# Patient Record
Sex: Male | Born: 1950
Health system: Southern US, Community
[De-identification: ages and names within clinical notes are randomized; demographics above are authoritative.]

## PROBLEM LIST (undated history)

## (undated) ENCOUNTER — Emergency Department (HOSPITAL_COMMUNITY): Admission: EM | Payer: PPO | Source: Home / Self Care

## (undated) DIAGNOSIS — E7800 Pure hypercholesterolemia, unspecified: Secondary | ICD-10-CM

## (undated) DIAGNOSIS — R079 Chest pain, unspecified: Secondary | ICD-10-CM

## (undated) DIAGNOSIS — Z6835 Body mass index (BMI) 35.0-35.9, adult: Secondary | ICD-10-CM

## (undated) DIAGNOSIS — U071 COVID-19: Secondary | ICD-10-CM

## (undated) DIAGNOSIS — L989 Disorder of the skin and subcutaneous tissue, unspecified: Secondary | ICD-10-CM

## (undated) DIAGNOSIS — E785 Hyperlipidemia, unspecified: Secondary | ICD-10-CM

## (undated) DIAGNOSIS — I2510 Atherosclerotic heart disease of native coronary artery without angina pectoris: Secondary | ICD-10-CM

## (undated) DIAGNOSIS — R7989 Other specified abnormal findings of blood chemistry: Secondary | ICD-10-CM

## (undated) DIAGNOSIS — A419 Sepsis, unspecified organism: Secondary | ICD-10-CM

## (undated) DIAGNOSIS — H9313 Tinnitus, bilateral: Secondary | ICD-10-CM

## (undated) DIAGNOSIS — I10 Essential (primary) hypertension: Secondary | ICD-10-CM

## (undated) DIAGNOSIS — Z8679 Personal history of other diseases of the circulatory system: Secondary | ICD-10-CM

## (undated) DIAGNOSIS — E669 Obesity, unspecified: Secondary | ICD-10-CM

## (undated) DIAGNOSIS — M545 Low back pain, unspecified: Secondary | ICD-10-CM

## (undated) DIAGNOSIS — R519 Headache, unspecified: Secondary | ICD-10-CM

## (undated) DIAGNOSIS — M1990 Unspecified osteoarthritis, unspecified site: Secondary | ICD-10-CM

## (undated) DIAGNOSIS — G44209 Tension-type headache, unspecified, not intractable: Secondary | ICD-10-CM

## (undated) DIAGNOSIS — G4733 Obstructive sleep apnea (adult) (pediatric): Secondary | ICD-10-CM

## (undated) DIAGNOSIS — E119 Type 2 diabetes mellitus without complications: Secondary | ICD-10-CM

## (undated) DIAGNOSIS — I959 Hypotension, unspecified: Secondary | ICD-10-CM

## (undated) DIAGNOSIS — I209 Angina pectoris, unspecified: Secondary | ICD-10-CM

## (undated) DIAGNOSIS — K429 Umbilical hernia without obstruction or gangrene: Secondary | ICD-10-CM

## (undated) DIAGNOSIS — C679 Malignant neoplasm of bladder, unspecified: Secondary | ICD-10-CM

## (undated) DIAGNOSIS — N390 Urinary tract infection, site not specified: Secondary | ICD-10-CM

## (undated) DIAGNOSIS — N200 Calculus of kidney: Secondary | ICD-10-CM

## (undated) DIAGNOSIS — M25569 Pain in unspecified knee: Secondary | ICD-10-CM

## (undated) HISTORY — DX: Sepsis, unspecified organism: A41.9

## (undated) HISTORY — DX: Pain in unspecified knee: M25.569

## (undated) HISTORY — DX: Headache, unspecified: R51.9

## (undated) HISTORY — DX: Morbid (severe) obesity due to excess calories: E66.01

## (undated) HISTORY — DX: Hypotension, unspecified: I95.9

## (undated) HISTORY — PX: HERNIA REPAIR: SHX51

## (undated) HISTORY — DX: Other specified abnormal findings of blood chemistry: R79.89

## (undated) HISTORY — DX: Tension-type headache, unspecified, not intractable: G44.209

## (undated) HISTORY — DX: COVID-19: U07.1

## (undated) HISTORY — DX: Umbilical hernia without obstruction or gangrene: K42.9

## (undated) HISTORY — DX: Personal history of other diseases of the circulatory system: Z86.79

## (undated) HISTORY — DX: Low back pain, unspecified: M54.50

## (undated) HISTORY — DX: Type 2 diabetes mellitus without complications: E11.9

## (undated) HISTORY — DX: Angina pectoris, unspecified: I20.9

## (undated) HISTORY — DX: Body mass index (BMI) 35.0-35.9, adult: Z68.35

## (undated) HISTORY — DX: Tinnitus, bilateral: H93.13

## (undated) HISTORY — DX: Urinary tract infection, site not specified: N39.0

## (undated) HISTORY — DX: Calculus of kidney: N20.0

## (undated) HISTORY — DX: Obstructive sleep apnea (adult) (pediatric): G47.33

## (undated) HISTORY — DX: Pure hypercholesterolemia, unspecified: E78.00

## (undated) HISTORY — DX: Malignant neoplasm of bladder, unspecified: C67.9

## (undated) HISTORY — DX: Unspecified osteoarthritis, unspecified site: M19.90

## (undated) HISTORY — PX: CORONARY STENT PLACEMENT: SHX1402

## (undated) HISTORY — DX: Disorder of the skin and subcutaneous tissue, unspecified: L98.9

## (undated) HISTORY — DX: Obesity, unspecified: E66.9

## (undated) HISTORY — PX: LITHOTRIPSY: SUR834

---

## 2004-05-15 ENCOUNTER — Ambulatory Visit (HOSPITAL_COMMUNITY): Admission: RE | Admit: 2004-05-15 | Discharge: 2004-05-15 | Payer: Self-pay | Admitting: Unknown Physician Specialty

## 2004-06-03 ENCOUNTER — Ambulatory Visit (HOSPITAL_COMMUNITY): Admission: RE | Admit: 2004-06-03 | Discharge: 2004-06-03 | Payer: Self-pay | Admitting: Urology

## 2004-06-24 ENCOUNTER — Ambulatory Visit (HOSPITAL_COMMUNITY): Admission: RE | Admit: 2004-06-24 | Discharge: 2004-06-24 | Payer: Self-pay | Admitting: Urology

## 2004-07-02 ENCOUNTER — Ambulatory Visit (HOSPITAL_COMMUNITY): Admission: RE | Admit: 2004-07-02 | Discharge: 2004-07-02 | Payer: Self-pay | Admitting: Urology

## 2004-07-03 ENCOUNTER — Ambulatory Visit (HOSPITAL_COMMUNITY): Admission: RE | Admit: 2004-07-03 | Discharge: 2004-07-03 | Payer: Self-pay | Admitting: Urology

## 2007-04-26 ENCOUNTER — Ambulatory Visit (HOSPITAL_COMMUNITY): Admission: RE | Admit: 2007-04-26 | Discharge: 2007-04-26 | Payer: Self-pay | Admitting: Urology

## 2007-06-21 ENCOUNTER — Ambulatory Visit (HOSPITAL_COMMUNITY): Admission: RE | Admit: 2007-06-21 | Discharge: 2007-06-21 | Payer: Self-pay | Admitting: Urology

## 2007-06-29 ENCOUNTER — Ambulatory Visit (HOSPITAL_COMMUNITY): Admission: RE | Admit: 2007-06-29 | Discharge: 2007-06-29 | Payer: Self-pay | Admitting: Urology

## 2008-07-02 ENCOUNTER — Ambulatory Visit: Payer: Self-pay | Admitting: Cardiology

## 2011-04-06 NOTE — H&P (Signed)
NAME:  Kyle Lambert, Kyle Lambert                 ACCOUNT NO.:  [id number]   MEDICAL RECORD NO.:  [id number]          PATIENT TYPE:  AMB   LOCATION:  DAY                           FACILITY:  [facility]   PHYSICIAN:  Ky Barban, M.D.DATE OF BIRTH:  Jul 02, 1951   DATE OF ADMISSION:  DATE OF DISCHARGE:  LH                              HISTORY & PHYSICAL   CHIEF COMPLAINT:  Left renal calculus.   HISTORY:  This 60 year old gentleman has a large stone in his left  kidney.  He has a horseshoe-shaped kidney. He had a stone in the right  kidney also and had ESL done several years ago.  Recent CT does not show  any residual stone on the right side, but there is a 1.1 x 8.6 cm stone  in the left kidney causing no obstruction.  He is not having many  symptoms. He was having symptoms of prostatism which is being treated  with Flomax with good result.  No fever, chills, or gross hematuria.  He  is coming as outpatient to undergo ESL for left renal calculus.   PAST MEDICAL HISTORY:  1. As mentioned above.  2. He had left ventral hernia repair in 1983.  3. Arthritis.   He does not have diabetes or hypertension   REVIEW OF SYSTEMS:  Unremarkable.   PHYSICAL EXAMINATION:  GENERAL:  Well-nourished, well-developed male.  VITAL SIGNS:  Blood pressure 143/87, temperature 97.8.  CENTRAL NERVOUS SYSTEM:  Negative.  HEAD AND NECK:  Eyes and ENT negative.  CHEST:  Symmetrical.  HEART:  Regular sinus rhythm, no murmur.  ABDOMEN:  Soft, flat.  Liver, spleen, kidneys not palpable.  No CVA  tenderness.  EXTERNAL GENITALIA:  Uncircumcised meatus.  Testicles are normal.  RECTAL:  Normal sphincter tone.  No rectal mass.  Prostate smooth and  firm.   IMPRESSION:  1. Left renal calculus.  2. Horseshoe kidney.   PLAN:  ESL as outpatient.      Ky Barban, M.D.  Electronically Signed     MIJ/MEDQ  D:  04/25/2007  T:  04/25/2007  Job:  [id number]

## 2011-04-09 NOTE — Op Note (Signed)
NAME:  Kyle Lambert, Kyle Lambert                           ACCOUNT NO.:  [id number]   MEDICAL RECORD NO.:  [id number]                   PATIENT TYPE:  AMB   LOCATION:  DAY                                  FACILITY:  [facility]   PHYSICIAN:  Ky Barban, M.D.            DATE OF BIRTH:  [birth year]   DATE OF PROCEDURE:  06/03/2004  DATE OF DISCHARGE:                                 OPERATIVE REPORT   PREOPERATIVE DIAGNOSIS:  Right ureteral calculus.   POSTOPERATIVE DIAGNOSIS:  Right ureteral calculus.   PROCEDURE:  Cystoscopy, right retrograde pyelogram, ureteroscopic stone  basket removal of stone, no double J stent.   ATTENDING UROLOGIST:  Ky Barban, M.D.   ANESTHESIA:  General.   PROCEDURE:  The patient was given general anesthesia, placed in the  lithotomy position.  After usual prepped and draped, a #25 cystoscope was  introduced into the bladder, right ureteral orifice catheterized with a  wedge catheter, Hypaque injected under fluoroscopic control.  His ureter is  dilated and there appears to be a stone in the intramural ureter.  At this  point, I removed the cystoscope and a short rigid ureteroscope was  introduced.  I was able to get into the ureter without dilating it.  Stone  is just below the ureterovesical junction and I introduced the stone basket  above the stone, opened this basket, retrieved and engaged the stone and  removed it.  All the instruments were removed.  The patient left the  operating room in satisfactory condition.      ___________________________________________                                            Ky Barban, M.D.   MIJ/MEDQ  D:  06/03/2004  T:  06/03/2004  Job:  [id number]

## 2011-04-09 NOTE — H&P (Signed)
NAME:  Kyle Lambert, Kyle Lambert                           ACCOUNT NO.:  [id number]   MEDICAL RECORD NO.:  [id number]                   PATIENT TYPE:  AMB   LOCATION:  DAY                                  FACILITY:  [facility]   PHYSICIAN:  Ky Barban, M.D.            DATE OF BIRTH:  [birth year]   DATE OF ADMISSION:  DATE OF DISCHARGE:                                HISTORY & PHYSICAL   CHIEF COMPLAINT:  Right inguinal calculus.   HISTORY OF PRESENT ILLNESS:  A 60 year old gentleman who has a stone in his  right kidney about 8 mm in size.  He has a horseshoe kidney.  He had a stone  in the distal right ureter, and a couple of weeks ago, I did a stone basket  to remove the stone from the right ureter.  The stone in the right kidney is  rather large, so we planned to go ahead and have it removed with  lithotripsy.  He is familiar with the procedure and wants me to go ahead and  proceed.  He has a 4 mm stone in the left kidney which is not causing any  pain, so we are going to leave that alone.   PAST MEDICAL HISTORY:  1. Left ventral hernia repair in 1983.  2. Arthritis.  3. ESL right kidney stone about three years ago.  4. Right ureteral stone basket a couple of weeks ago.   FAMILY HISTORY:  No history of prostate cancer.   PERSONAL HISTORY:  Does not smoke or drink.   REVIEW OF SYSTEMS:  Unremarkable.   PHYSICAL EXAMINATION:  VITAL SIGNS:  Blood pressure 150/80, temperature  normal.  CENTRAL NERVOUS SYSTEM:  Negative.  HEAD, NECK, EYE, ENT:  Negative.  CHEST:  Clear.  HEART:  Regular sinus rhythm.  ABDOMEN:  Soft, flat.  Liver, spleen, kidneys not palpable.  No CVA  tenderness  EXTERNAL GENITALIA:  Unremarkable.  RECTAL:  Exam deferred.  EXTREMITIES:  Normal.   IMPRESSION:  Right renal calculus.   PLAN:  ESL right renal calculus.    ___________________________________________                                         Ky Barban, M.D.   MIJ/MEDQ  D:  06/23/2004  T:   06/23/2004  Job:  [id number]

## 2011-04-09 NOTE — H&P (Signed)
NAME:  Kyle Lambert, Kyle Lambert NO.:  [id number]   MEDICAL RECORD NO.:  [id number]                  PATIENT TYPE:   LOCATION:                                       FACILITY:   PHYSICIAN:  Ky Barban, M.D.            DATE OF BIRTH:   DATE OF ADMISSION:  DATE OF DISCHARGE:                                HISTORY & PHYSICAL   CHIEF COMPLAINT:  Right renal colic.   HISTORY OF PRESENT ILLNESS:  A 60 year old gentleman who is well-known to  me.  He is having pain in his right flank for several days.  Last week he  was hurting real bad.  He had an MRI done because of this backache, and on  MRI it was noted that he had a stone in his right kidney and the right  ureter.  I have known this patient from previous treatment a couple of years  ago.  He had an ESL done for right renal calculus which was rather large,  and he has a horseshoe shaped kidney.  It was treated.  I have not seen him  since then.  He comes in with right renal colic.  He has a 4 mm stone in the  distal right ureter, and an 8 mm stone in the right kidney.  There is  another stone in the left kidney.  His pain in the right flank is because of  his 4 mm stone in the distal right ureter.  Last week I told him to see if  he can pass it.  He comes back today that he is miserable last week.  Today,  again, he is having a lot of pain.  He wants something done about it, so I  have advised him to undergo stone basket.  The procedure of stone basket has  complications, especially ureteral perforation leading to a puncture with  the use of double J stent, stone migration were all discussed with the  patient and his wife.  They understand and want me to go ahead and proceed.  The use of double J stent was also discussed.  He coming as an outpatient  tomorrow to undergo stone basket.   PAST MEDICAL HISTORY:  1. Left ventral hernia repair in 1983.  2. Arthritis.  3. Extracorporeal shockwave lithotripsy  right kidney about three years ago.   FAMILY HISTORY:  No history of prostate cancer.   SOCIAL HISTORY:  Personal history:  He does not smoke or drink.   REVIEW OF SYSTEMS:  Unremarkable.  No history of diabetes or hypertension.   PHYSICAL EXAMINATION:  GENERAL:  A well-nourished, well-developed male, in  moderate distress.  VITAL SIGNS:  Blood pressure 150/80, temperature is normal.  NEUROLOGIC:  Central nervous system, no gross neurological deficits.  HEENT:  Head, neck, ENT negative.  CHEST:  Symmetrical.  HEART:  Regular sinus rhythm.  ABDOMEN:  Soft, flat.  Liver, spleen, kidneys are not palpable.  There is 1+  right CVA tenderness.  EXTERNAL GENITALIA:  Unremarkable.  RECTAL EXAM:  Prostate 1.5+ smooth, firm.   IMPRESSION:  Right ureteral and renal calculus.   PLAN:  Right retrograde pyelogram, ureteroscopic chromium laser lithotripsy,  stone basket, and double J stent under anesthesia as an outpatient.     ___________________________________________                                         Ky Barban, M.D.   MIJ/MEDQ  D:  06/02/2004  T:  06/02/2004  Job:  [id number]

## 2011-04-09 NOTE — H&P (Signed)
NAME:  Kyle Lambert, Kyle Lambert                 ACCOUNT NO.:  [id number]   MEDICAL RECORD NO.:  [id number]          PATIENT TYPE:  AMB   LOCATION:  DAY                           FACILITY:  [facility]   PHYSICIAN:  Ky Barban, M.D.DATE OF BIRTH:  [birth year]   DATE OF ADMISSION:  DATE OF DISCHARGE:  LH                              HISTORY & PHYSICAL   ADDENDUM:  This 60 year old gentleman has left renal calculus and a  horseshoe shaped kidney.  He is scheduled to have ESL and he was brought  in about a month ago but his procedure was canceled because he did not  quit taking his aspirin.  Now he is brought back again for the same  procedure, so there is no change in his history and physical exam and  this should be used as an Addendum to his History and Physical from last  month.   PLAN:  Is the same, to undergo ESL for his left renal calculus.      Ky Barban, M.D.  Electronically Signed     MIJ/MEDQ  D:  07/21/2007  T:  07/21/2007  Job:  [id number]

## 2012-04-21 ENCOUNTER — Encounter (HOSPITAL_COMMUNITY)
Admission: AD | Disposition: A | Discharge status: Home / Self Care | Payer: Self-pay | Source: Other Acute Inpatient Hospital | Attending: Cardiology

## 2012-04-21 ENCOUNTER — Inpatient Hospital Stay (HOSPITAL_COMMUNITY)
Admission: AD | Admit: 2012-04-21 | Discharge: 2012-04-25 | DRG: 854 | Disposition: A | Discharge status: Home / Self Care | Payer: BC Managed Care – PPO | Source: Other Acute Inpatient Hospital | Attending: Cardiology | Admitting: Cardiology

## 2012-04-21 ENCOUNTER — Encounter (HOSPITAL_COMMUNITY): Payer: Self-pay | Admitting: General Practice

## 2012-04-21 ENCOUNTER — Ambulatory Visit (HOSPITAL_COMMUNITY): Admit: 2012-04-21 | Payer: BC Managed Care – PPO | Admitting: Cardiology

## 2012-04-21 ENCOUNTER — Other Ambulatory Visit: Payer: Self-pay | Admitting: Physician Assistant

## 2012-04-21 DIAGNOSIS — Z7982 Long term (current) use of aspirin: Secondary | ICD-10-CM

## 2012-04-21 DIAGNOSIS — M129 Arthropathy, unspecified: Secondary | ICD-10-CM | POA: Diagnosis present

## 2012-04-21 DIAGNOSIS — I2582 Chronic total occlusion of coronary artery: Secondary | ICD-10-CM | POA: Diagnosis present

## 2012-04-21 DIAGNOSIS — G4700 Insomnia, unspecified: Secondary | ICD-10-CM | POA: Diagnosis present

## 2012-04-21 DIAGNOSIS — I2510 Atherosclerotic heart disease of native coronary artery without angina pectoris: Principal | ICD-10-CM | POA: Diagnosis present

## 2012-04-21 DIAGNOSIS — I200 Unstable angina: Secondary | ICD-10-CM

## 2012-04-21 DIAGNOSIS — R079 Chest pain, unspecified: Secondary | ICD-10-CM

## 2012-04-21 DIAGNOSIS — Z79899 Other long term (current) drug therapy: Secondary | ICD-10-CM

## 2012-04-21 DIAGNOSIS — I10 Essential (primary) hypertension: Secondary | ICD-10-CM | POA: Diagnosis present

## 2012-04-21 DIAGNOSIS — E785 Hyperlipidemia, unspecified: Secondary | ICD-10-CM | POA: Diagnosis present

## 2012-04-21 DIAGNOSIS — Z955 Presence of coronary angioplasty implant and graft: Secondary | ICD-10-CM

## 2012-04-21 DIAGNOSIS — E782 Mixed hyperlipidemia: Secondary | ICD-10-CM

## 2012-04-21 HISTORY — DX: Chest pain, unspecified: R07.9

## 2012-04-21 HISTORY — DX: Hyperlipidemia, unspecified: E78.5

## 2012-04-21 HISTORY — DX: Atherosclerotic heart disease of native coronary artery without angina pectoris: I25.10

## 2012-04-21 HISTORY — PX: LEFT HEART CATHETERIZATION WITH CORONARY ANGIOGRAM: SHX5451

## 2012-04-21 HISTORY — DX: Essential (primary) hypertension: I10

## 2012-04-21 HISTORY — DX: Unspecified osteoarthritis, unspecified site: M19.90

## 2012-04-21 LAB — CBC
HCT: 35 % — ABNORMAL LOW (ref 39.0–52.0)
Hemoglobin: 11.8 g/dL — ABNORMAL LOW (ref 13.0–17.0)
MCH: 29.3 pg (ref 26.0–34.0)
MCHC: 33.7 g/dL (ref 30.0–36.0)

## 2012-04-21 SURGERY — LEFT HEART CATHETERIZATION WITH CORONARY ANGIOGRAM
Anesthesia: LOCAL

## 2012-04-21 MED ORDER — SODIUM CHLORIDE 0.9 % IJ SOLN: 3.0000 mL | INTRAMUSCULAR | Status: DC | PRN

## 2012-04-21 MED ORDER — NITROGLYCERIN 0.2 MG/ML ON CALL CATH LAB
INTRAVENOUS | Status: AC
  Filled 2012-04-21: qty 1

## 2012-04-21 MED ORDER — SODIUM CHLORIDE 0.9 % IV SOLN
INTRAVENOUS | Status: DC
  Administered 2012-04-21: 14:00:00 via INTRAVENOUS

## 2012-04-21 MED ORDER — NITROGLYCERIN 2 % TD OINT
1.0000 [in_us] | TOPICAL_OINTMENT | Freq: Four times a day (QID) | TRANSDERMAL | Status: DC
  Administered 2012-04-21 – 2012-04-22 (×3): 1 [in_us] via TOPICAL
  Filled 2012-04-21: qty 30

## 2012-04-21 MED ORDER — HYDROCHLOROTHIAZIDE 12.5 MG PO CAPS
12.5000 mg | ORAL_CAPSULE | Freq: Every day | ORAL | Status: DC
  Administered 2012-04-21 – 2012-04-25 (×5): 12.5 mg via ORAL
  Filled 2012-04-21 (×5): qty 1

## 2012-04-21 MED ORDER — PANTOPRAZOLE SODIUM 40 MG PO TBEC
40.0000 mg | DELAYED_RELEASE_TABLET | Freq: Every day | ORAL | Status: DC
  Administered 2012-04-21 – 2012-04-25 (×5): 40 mg via ORAL
  Filled 2012-04-21 (×5): qty 1

## 2012-04-21 MED ORDER — ASPIRIN EC 81 MG PO TBEC
81.0000 mg | DELAYED_RELEASE_TABLET | Freq: Every day | ORAL | Status: DC
  Administered 2012-04-22 – 2012-04-23 (×2): 81 mg via ORAL
  Filled 2012-04-21 (×2): qty 1

## 2012-04-21 MED ORDER — SODIUM CHLORIDE 0.9 % IJ SOLN
3.0000 mL | Freq: Two times a day (BID) | INTRAMUSCULAR | Status: DC
  Administered 2012-04-21: 3 mL via INTRAVENOUS

## 2012-04-21 MED ORDER — HYDROCHLOROTHIAZIDE 25 MG PO TABS
12.5000 mg | ORAL_TABLET | Freq: Every day | ORAL | Status: DC
  Filled 2012-04-21: qty 0.5

## 2012-04-21 MED ORDER — VITAMIN D3 25 MCG (1000 UNIT) PO TABS
1000.0000 [IU] | ORAL_TABLET | Freq: Every day | ORAL | Status: DC
  Administered 2012-04-21 – 2012-04-24 (×4): 1000 [IU] via ORAL
  Filled 2012-04-21 (×4): qty 1

## 2012-04-21 MED ORDER — SIMVASTATIN 10 MG PO TABS: 10.0000 mg | ORAL_TABLET | Freq: Every day | ORAL | Status: DC

## 2012-04-21 MED ORDER — ASPIRIN 81 MG PO CHEW: 324.0000 mg | CHEWABLE_TABLET | ORAL | Status: DC

## 2012-04-21 MED ORDER — SODIUM CHLORIDE 0.9 % IV SOLN
INTRAVENOUS | Status: DC
  Administered 2012-04-21 – 2012-04-24 (×4): via INTRAVENOUS

## 2012-04-21 MED ORDER — CIPROFLOXACIN HCL 500 MG PO TABS
500.0000 mg | ORAL_TABLET | Freq: Two times a day (BID) | ORAL | Status: DC
  Administered 2012-04-21 – 2012-04-25 (×8): 500 mg via ORAL
  Filled 2012-04-21 (×13): qty 1

## 2012-04-21 MED ORDER — FENTANYL CITRATE 0.05 MG/ML IJ SOLN
INTRAMUSCULAR | Status: AC
  Filled 2012-04-21: qty 2

## 2012-04-21 MED ORDER — ACETAMINOPHEN 325 MG PO TABS: 650.0000 mg | ORAL_TABLET | ORAL | Status: DC | PRN

## 2012-04-21 MED ORDER — HEPARIN (PORCINE) IN NACL 2-0.9 UNIT/ML-% IJ SOLN
INTRAMUSCULAR | Status: AC
  Filled 2012-04-21: qty 2000

## 2012-04-21 MED ORDER — ONDANSETRON HCL 4 MG/2ML IJ SOLN: 4.0000 mg | Freq: Four times a day (QID) | INTRAMUSCULAR | Status: DC | PRN

## 2012-04-21 MED ORDER — NAPROXEN 500 MG PO TABS
1000.0000 mg | ORAL_TABLET | Freq: Every day | ORAL | Status: DC
  Administered 2012-04-22 – 2012-04-24 (×3): 1000 mg via ORAL
  Filled 2012-04-21 (×4): qty 2

## 2012-04-21 MED ORDER — HEPARIN (PORCINE) IN NACL 100-0.45 UNIT/ML-% IJ SOLN
1350.0000 [IU]/h | INTRAMUSCULAR | Status: DC
  Administered 2012-04-22 – 2012-04-24 (×4): 1350 [IU]/h via INTRAVENOUS
  Filled 2012-04-21 (×5): qty 250

## 2012-04-21 MED ORDER — LIDOCAINE HCL (PF) 1 % IJ SOLN
INTRAMUSCULAR | Status: AC
  Filled 2012-04-21: qty 30

## 2012-04-21 MED ORDER — NITROGLYCERIN 0.4 MG SL SUBL: 0.4000 mg | SUBLINGUAL_TABLET | SUBLINGUAL | Status: DC | PRN

## 2012-04-21 MED ORDER — MIDAZOLAM HCL 2 MG/2ML IJ SOLN
INTRAMUSCULAR | Status: AC
  Filled 2012-04-21: qty 2

## 2012-04-21 MED ORDER — SIMVASTATIN 10 MG PO TABS
10.0000 mg | ORAL_TABLET | Freq: Every day | ORAL | Status: DC
  Administered 2012-04-21: 10 mg via ORAL
  Filled 2012-04-21 (×3): qty 1

## 2012-04-21 MED ORDER — SODIUM CHLORIDE 0.9 % IV SOLN: INTRAVENOUS | Status: AC

## 2012-04-21 MED ORDER — FISH OIL 1200 MG PO CAPS: 3.0000 | ORAL_CAPSULE | Freq: Every day | ORAL | Status: DC

## 2012-04-21 MED ORDER — OMEGA-3-ACID ETHYL ESTERS 1 G PO CAPS
3.0000 g | ORAL_CAPSULE | Freq: Every day | ORAL | Status: DC
  Administered 2012-04-21 – 2012-04-25 (×5): 3 g via ORAL
  Filled 2012-04-21 (×5): qty 3

## 2012-04-21 MED ORDER — SODIUM CHLORIDE 0.9 % IV SOLN: 250.0000 mL | INTRAVENOUS | Status: DC | PRN

## 2012-04-21 MED ORDER — LISINOPRIL 20 MG PO TABS
20.0000 mg | ORAL_TABLET | Freq: Every day | ORAL | Status: DC
  Administered 2012-04-22 – 2012-04-25 (×4): 20 mg via ORAL
  Filled 2012-04-21 (×5): qty 1

## 2012-04-21 MED ORDER — LISINOPRIL-HYDROCHLOROTHIAZIDE 20-12.5 MG PO TABS: 1.0000 | ORAL_TABLET | Freq: Every day | ORAL | Status: DC

## 2012-04-21 MED ORDER — ASPIRIN 300 MG RE SUPP
300.0000 mg | RECTAL | Status: DC
  Filled 2012-04-21: qty 1

## 2012-04-21 MED ORDER — ACETAMINOPHEN 325 MG PO TABS
650.0000 mg | ORAL_TABLET | ORAL | Status: DC | PRN
  Administered 2012-04-21 – 2012-04-22 (×2): 650 mg via ORAL
  Filled 2012-04-21 (×2): qty 2

## 2012-04-21 MED ORDER — DIAZEPAM 5 MG PO TABS
5.0000 mg | ORAL_TABLET | ORAL | Status: AC
  Administered 2012-04-21: 5 mg via ORAL
  Filled 2012-04-21: qty 1

## 2012-04-21 NOTE — Progress Notes (Signed)
ANTICOAGULATION CONSULT NOTE - Initial Consult  Pharmacy Consult for heparin Indication: mod - severe CAD post cath, to be evaluated for possible CABG  No Known Allergies  Patient Measurements: Height: 5\' 9"  (175.3 cm) Weight: 251 lb 1.6 oz (113.898 kg) IBW/kg (Calculated) : 70.7  Heparin Dosing Weight: 96.5kg  Vital Signs: Temp: 97.8 F (36.6 C) (05/31 1800) Temp src: Oral (05/31 1800) BP: 110/65 mmHg (05/31 1800) Pulse Rate: 84  (05/31 1800)  Labs: No results found for this basename: HGB:2,HCT:3,PLT:3,APTT:3,LABPROT:3,INR:3,HEPARINUNFRC:3,CREATININE:3,CKTOTAL:3,CKMB:3,TROPONINI:3 in the last 72 hours  CrCl is unknown because no creatinine reading has been taken.   Medical History: Past Medical History  Diagnosis Date  . Chest pain 04/21/2012  . Hypertension   . Shortness of breath   . Arthritis     Medications:  Scheduled:    . aspirin EC  81 mg Oral Daily  . cholecalciferol  1,000 Units Oral Daily  . ciprofloxacin  500 mg Oral BID  . diazepam  5 mg Oral On Call  . fentaNYL      . heparin      . hydrochlorothiazide  12.5 mg Oral Daily  . lidocaine      . lisinopril  20 mg Oral Daily  . midazolam      . naproxen  1,000 mg Oral Q breakfast  . nitroGLYCERIN  1 inch Topical Q6H  . nitroGLYCERIN      . omega-3 acid ethyl esters  3 g Oral Daily  . pantoprazole  40 mg Oral Q1200  . simvastatin  10 mg Oral q1800  . DISCONTD: aspirin  324 mg Oral NOW  . DISCONTD: aspirin  300 mg Rectal NOW  . DISCONTD: Fish Oil  3 capsule Oral Daily  . DISCONTD: hydrochlorothiazide  12.5 mg Oral Daily  . DISCONTD: lisinopril-hydrochlorothiazide  1 tablet Oral Daily  . DISCONTD: simvastatin  10 mg Oral QHS  . DISCONTD: sodium chloride  3 mL Intravenous Q12H    Assessment: 55 YOM presents for cath 5/31 for exertional CP.  Cath reveals mod-severe CAD and TCTS to eval for CABG.  No baseline CBC or coags. Note patient no high dose of naproxen.   Sheath removed at 18:05  Goal of  Therapy:  Heparin level 0.3-0.7 units/ml Monitor platelets by anticoagulation protocol: Yes   Plan:  1. Start heparin gtt at 02:00 (8h post-sheath removal) at rate of 1350 units/hr 2. Check baseline CBC, aptt 3. Check Heparin level 6h after started 4. Daily heparin level and CBC. Monitor for bleeding esp with high dose NSAID  Dannielle Huh 04/21/2012,8:52 PM

## 2012-04-21 NOTE — H&P (View-Only) (Signed)
NAME:  ALBA, KRIESEL ROOM: 220  UNIT NUMBER:  [id number] LOCATION: 34F 220 01 ADM/VISIT DATE:  04/20/2012   ADM PHYSKathaleen Grinder:  [id number] DOB: [birth year]   ADDENDUM TO GENE SERPE'S H&P  SUMMARY:  The patient is a 61 year old male with a history of hypertension and hyperlipidemia.  The patient has no prior history of coronary artery disease and had a stress test done in 2009, which was within normal limits.  However, during a routine visit with his primary care physician he reported that he had, for 3 weeks, exertional chest pain.  He states that the pressure is a 6/10 to 7/10, quite intense in nature, and associated with shortness of breath.  The patient states that the chest pressure is preventing him from walking, which he likes to do to relieve anxiety and stress.  The patient, over the last year, has gone through a very stressful period in his life and likely explanation for what appears to be coronary disease.  The patient lost his home to foreclosure less than a year ago, then moved into his mother's house, who died in the meanwhile.  There is still a remaining mortgage on the house, and they are trying to get a loan, which he is not certain that he can get that.  He has ruminating thoughts.  He cannot sleep at night, and he is extremely stressed out.  He states now he also cannot walk anymore to relieve his stress, because of the chest tightness.  I discussed with the patient the risks and benefits of a cardiac catheterization.  I strongly advised to proceed with this.  There is no place for a stress test, at this point in time.  Hopefully the patient has interventional disease and can be discharged tomorrow.  The patient is in agreement and understands the risks and benefits.  At the end of my conversation, the patient started developing chest pain and we had to give him sublingual nitroglycerin.  He will be transferred urgently to [facility] for diagnostic cardiac catheterization.  I  discussed the risks and benefits of a diagnostic cardiac catheterization with the patient.   We also discussed the radial versus femoral approach.  In particular I quoted that the risk of bleeding from the radial artery is usually less than 1%.  I also explained however that the procedure is more challenging for the cardiologist and does involve a slightly greater radiation exposure although scientist estimate that increased radiation is equivalent to 20 chest x-rays representing only a small risk of the patient.   The following general risks were quoted to the patient for a diagnostic cardiac catheterization:      __________________________    Lewayne Bunting, M.D. Alinda Money D: 04/21/2012 0454 T: 04/21/2012 1016 P: DEG  cc:  Quintin Alto, M.D. Alvin Critchley [facility] 04/21/2012 11:36 AM

## 2012-04-21 NOTE — H&P (Signed)
NAME:  Kyle Lambert, Kyle Lambert WAYNE ROOM: 220  UNIT NUMBER:  [id number] LOCATION: 2F 220 01 ADM/VISIT DATE:  04/20/2012   ADM PHYS:  ACCT:  [id number] DOB: 12/04/1950   ADDENDUM TO GENE SERPE'S H&P  SUMMARY:  The patient is a 60-year-old male with a history of hypertension and hyperlipidemia.  The patient has no prior history of coronary artery disease and had a stress test done in 2009, which was within normal limits.  However, during a routine visit with his primary care physician he reported that he had, for 3 weeks, exertional chest pain.  He states that the pressure is a 6/10 to 7/10, quite intense in nature, and associated with shortness of breath.  The patient states that the chest pressure is preventing him from walking, which he likes to do to relieve anxiety and stress.  The patient, over the last year, has gone through a very stressful period in his life and likely explanation for what appears to be coronary disease.  The patient lost his home to foreclosure less than a year ago, then moved into his mother's house, who died in the meanwhile.  There is still a remaining mortgage on the house, and they are trying to get a loan, which he is not certain that he can get that.  He has ruminating thoughts.  He cannot sleep at night, and he is extremely stressed out.  He states now he also cannot walk anymore to relieve his stress, because of the chest tightness.  I discussed with the patient the risks and benefits of a cardiac catheterization.  I strongly advised to proceed with this.  There is no place for a stress test, at this point in time.  Hopefully the patient has interventional disease and can be discharged tomorrow.  The patient is in agreement and understands the risks and benefits.  At the end of my conversation, the patient started developing chest pain and we had to give him sublingual nitroglycerin.  He will be transferred urgently to [facility] for diagnostic cardiac catheterization.  I  discussed the risks and benefits of a diagnostic cardiac catheterization with the patient.   We also discussed the radial versus femoral approach.  In particular I quoted that the risk of bleeding from the radial artery is usually less than 1%.  I also explained however that the procedure is more challenging for the cardiologist and does involve a slightly greater radiation exposure although scientist estimate that increased radiation is equivalent to 20 chest x-rays representing only a small risk of the patient.   The following general risks were quoted to the patient for a diagnostic cardiac catheterization:      __________________________    Rehmat Murtagh, M.D. /landm D: 04/21/2012 0952 T: 04/21/2012 1016 P: DEG  cc:  STEVEN BURDINE, M.D. Kyle Lambert 04/21/2012 11:36 AM     

## 2012-04-21 NOTE — CV Procedure (Signed)
    Cardiac Cath Note  Kyle Lambert [id number] 1951-08-12  Procedure: Left Heart Cardiac Catheterization Note Indications: chest pain  Procedure Details Consent: Obtained Time Out: Verified patient identification, verified procedure, site/side was marked, verified correct patient position, special equipment/implants available, Radiology Safety Procedures followed,  medications/allergies/relevent history reviewed, required imaging and test results available.  Performed   Medications: Fentanyl: 50 mcg IV Versed: 2 mg IV  The right femoral artery was easily canulated using a modified Seldinger technique.  Hemodynamics:   LV pressure: 124/14 Aortic pressure: 114/78  Angiography   Left Main: short LM.  Left anterior Descending: calcified, moderate stenosis of 50%.  There are moderate irregularities and then a 80-90% stenosis in the mid/distal LAD. There is a moderate sized diagonal that originates from the proximal 50% LAD  Stneosis.  This diagonal is subtotally occluded and has sluggish flow.    Left Circumflex: large system.  There are mild-moderate  irregularities throughout.  There is a large 1st OM branch  Right Coronary Artery: large and dominant.  There are  proximal 60% and then an 80 % stneosis in the proximal vessel.  The distal RCA has moderate diffuse irregularities  LV Gram: normal LV systolic function with EF of 65%  Complications: No apparent complications Patient did tolerate procedure well.  Conclusions:   Moderate - severe CAD involving primarily the LAD and RCA.  The LCx has mild - moderate disease.   I have reviewed the angios with Dr. Riley Kill.  We think he may be best managed surgically.  I will have the CV surgeons see him for further evaluation.  Vesta Mixer, [name], MD, [facility] 04/21/2012, 5:33 PM Office - [phone] Pager [phone]

## 2012-04-21 NOTE — Interval H&P Note (Signed)
History and Physical Interval Note:  04/21/2012 4:22 PM  Kyle Lambert  has presented today for surgery, with the diagnosis of chest pain  The various methods of treatment have been discussed with the patient and family. After consideration of risks, benefits and other options for treatment, the patient has consented to  Procedure(s) (LRB): LEFT HEART CATHETERIZATION WITH CORONARY ANGIOGRAM (N/A) as a surgical intervention .  The patients' history has been reviewed, patient examined, no change in status, stable for surgery.  I have reviewed the patients' chart and labs.  Questions were answered to the patient's satisfaction.     Elyn Aquas.

## 2012-04-22 DIAGNOSIS — R079 Chest pain, unspecified: Secondary | ICD-10-CM

## 2012-04-22 LAB — CBC
Hemoglobin: 12.4 g/dL — ABNORMAL LOW (ref 13.0–17.0)
MCH: 29.4 pg (ref 26.0–34.0)
RBC: 4.22 MIL/uL (ref 4.22–5.81)

## 2012-04-22 LAB — URINALYSIS, ROUTINE W REFLEX MICROSCOPIC
Bilirubin Urine: NEGATIVE
Glucose, UA: NEGATIVE mg/dL
Ketones, ur: NEGATIVE mg/dL
Nitrite: NEGATIVE
Specific Gravity, Urine: 1.015 (ref 1.005–1.030)
pH: 5.5 (ref 5.0–8.0)

## 2012-04-22 LAB — HEPARIN LEVEL (UNFRACTIONATED): Heparin Unfractionated: 0.31 IU/mL (ref 0.30–0.70)

## 2012-04-22 LAB — URINE MICROSCOPIC-ADD ON

## 2012-04-22 MED ORDER — TRAZODONE 25 MG HALF TABLET
75.0000 mg | ORAL_TABLET | Freq: Every day | ORAL | Status: DC
  Administered 2012-04-22 – 2012-04-24 (×3): 75 mg via ORAL
  Filled 2012-04-22 (×5): qty 1

## 2012-04-22 MED ORDER — AMLODIPINE BESYLATE 5 MG PO TABS
5.0000 mg | ORAL_TABLET | Freq: Every day | ORAL | Status: DC
  Administered 2012-04-22 – 2012-04-25 (×4): 5 mg via ORAL
  Filled 2012-04-22 (×4): qty 1

## 2012-04-22 MED ORDER — ATORVASTATIN CALCIUM 80 MG PO TABS
80.0000 mg | ORAL_TABLET | Freq: Every day | ORAL | Status: DC
  Administered 2012-04-22 – 2012-04-24 (×3): 80 mg via ORAL
  Filled 2012-04-22 (×5): qty 1

## 2012-04-22 MED ORDER — ISOSORBIDE MONONITRATE ER 60 MG PO TB24
60.0000 mg | ORAL_TABLET | Freq: Every day | ORAL | Status: DC
  Administered 2012-04-22 – 2012-04-25 (×4): 60 mg via ORAL
  Filled 2012-04-22 (×4): qty 1

## 2012-04-22 NOTE — Progress Notes (Signed)
ANTICOAGULATION CONSULT NOTE - Follow Up Consult  Pharmacy Consult for Heparin Indication: CAD s/p cath, to be evaluated for CABG  No Known Allergies  Patient Measurements: Height: 5\' 9"  (175.3 cm) Weight: 251 lb 1.6 oz (113.898 kg) IBW/kg (Calculated) : 70.7  Heparin Dosing Weight: 96.5 kg  Vital Signs: Temp: 98 F (36.7 C) (06/01 1430) Temp src: Oral (06/01 1430) BP: 109/65 mmHg (06/01 1430) Pulse Rate: 70  (06/01 1430)  Labs:  Basename 04/22/12 1410 04/22/12 0800 04/21/12 2201  HGB 12.4* -- 11.8*  HCT 36.6* -- 35.0*  PLT 244 -- 238  APTT -- -- 32  LABPROT -- -- --  INR -- -- --  HEPARINUNFRC 0.32 0.31 --  CREATININE -- -- --  CKTOTAL -- -- --  CKMB -- -- --  TROPONINI -- -- --    CrCl is unknown because no creatinine reading has been taken.   Medications:  Infusions:     . sodium chloride 75 mL/hr at 04/22/12 0831  . sodium chloride 75 mL/hr at 04/21/12 1404  . sodium chloride 125 mL/hr at 04/21/12 1815  . heparin 1,350 Units/hr (04/22/12 1610)    Assessment: 62 yo male found to have 2V CAD s/p cath on heparin awaiting decision for CABG vs coronary intervention. Heparin level is therapeutic x2. No bleeding reported. CBC is stable.  Goal of Therapy:  Heparin level 0.3-0.7 units/ml Monitor platelets by anticoagulation protocol: Yes   Plan:  Continue heparin infusion at 1350 units/hr Daily heparin level and CBC while on heparin  [facility], Dixon.D., BCPS Clinical Pharmacist 04/22/2012 3:31 PM

## 2012-04-22 NOTE — Progress Notes (Signed)
Peyton Bottoms, MD, [facility] ABIM Board Certified in Adult Cardiovascular Medicine,Internal Medicine and Critical Care Medicine      Subjective:    Patient underwent cardiac catheterization yesterday.  There were no complications.  He is doing well this morning with no recurrent angina.  He is on IV heparin.  He reports no shortness of breath.  He has had difficulty sleeping through the night.  Otherwise he has no specific complaints.    Objective:   Weight Range:  Vital Signs:   Temp:  [97.8 F (36.6 C)-98.3 F (36.8 C)] 98.3 F (36.8 C) (06/01 0500) Pulse Rate:  [72-87] 76  (06/01 0500) Resp:  [17-20] 20  (06/01 0500) BP: (106-156)/(65-84) 156/84 mmHg (06/01 0500) SpO2:  [94 %-100 %] 97 % (06/01 0500) Weight:  [251 lb 1.6 oz (113.898 kg)] 251 lb 1.6 oz (113.898 kg) (05/31 1330) Last BM Date: 04/20/12  Weight change: Filed Weights   04/21/12 1330  Weight: 251 lb 1.6 oz (113.898 kg)    Intake/Output:   Intake/Output Summary (Last 24 hours) at 04/22/12 0810 Last data filed at 04/22/12 0500  Gross per 24 hour  Intake    240 ml  Output    200 ml  Net     40 ml     Physical Exam: General: overweight white male in no distress Cardiac: Normal S1 and S2 no murmur rubs or gallops no pathological murmurs Lungs: clear Groin: No hematoma and no bruit no evidence of pseudoaneurysm or AV fistula Extremities: No edema or pain  Telemetry: Normal sinus rhythm  Labs: Basic Metabolic Panel: No results found for this basename: NA:5,K:5,CL:5,CO2:5,GLUCOSE:5,BUN:5,CREATININE:5,CALCIUM:3,MG:5,PHOS:5 in the last 168 hours  Liver Function Tests: No results found for this basename: AST:5,ALT:5,ALKPHOS:5,BILITOT:5,PROT:5,ALBUMIN:5 in the last 168 hours No results found for this basename: LIPASE:5,AMYLASE:5 in the last 168 hours No results found for this basename: AMMONIA:3 in the last 168 hours  CBC:  Lab 04/21/12 2201  WBC 9.1  NEUTROABS --  HGB 11.8*  HCT 35.0*  MCV 86.8  PLT  238    Cardiac Enzymes: No results found for this basename: CKTOTAL:5,CKMB:5,CKMBINDEX:5,TROPONINI:5 in the last 168 hours   BNP: BNP (last 3 results) No results found for this basename: PROBNP:3 in the last 8760 hours  ABG No results found for this basename: phart, pco2, pco2art, po2, po2art, hco3, tco2, acidbasedef, o2sat     Other results: EKG: Normal sinus rhythm and no acute ischemic changes Imaging:  No results found.   Medications:     Scheduled Medications:    . aspirin EC  81 mg Oral Daily  . cholecalciferol  1,000 Units Oral Daily  . ciprofloxacin  500 mg Oral BID  . diazepam  5 mg Oral On Call  . fentaNYL      . heparin      . hydrochlorothiazide  12.5 mg Oral Daily  . lidocaine      . lisinopril  20 mg Oral Daily  . midazolam      . naproxen  1,000 mg Oral Q breakfast  . nitroGLYCERIN  1 inch Topical Q6H  . nitroGLYCERIN      . omega-3 acid ethyl esters  3 g Oral Daily  . pantoprazole  40 mg Oral Q1200  . simvastatin  10 mg Oral q1800  . DISCONTD: aspirin  324 mg Oral NOW  . DISCONTD: aspirin  300 mg Rectal NOW  . DISCONTD: Fish Oil  3 capsule Oral Daily  . DISCONTD: hydrochlorothiazide  12.5 mg  Oral Daily  . DISCONTD: lisinopril-hydrochlorothiazide  1 tablet Oral Daily  . DISCONTD: simvastatin  10 mg Oral QHS  . DISCONTD: sodium chloride  3 mL Intravenous Q12H     Infusions:    . sodium chloride 75 mL/hr at 04/21/12 1403  . sodium chloride 75 mL/hr at 04/21/12 1404  . sodium chloride 125 mL/hr at 04/21/12 1815  . heparin 1,350 Units/hr (04/22/12 0218)     PRN Medications:  acetaminophen, nitroGLYCERIN, ondansetron (ZOFRAN) IV, DISCONTD: sodium chloride, DISCONTD: acetaminophen, DISCONTD: ondansetron (ZOFRAN) IV, DISCONTD: sodium chloride   Assessment:   #1 exertional angina       A.80-90% RCA disease, 50% LAD disease, subtotal       occlusion of the diagonal of the LAD and moderate       circumflex disease , Normal LV function    B.Per Dr. Tedra Senegal evaluation by surgeon for second   opinion  #2 dyslipidemia #3 hypertension #4 arthritis #5 insomnia   Plan/Discussion:    The patient is doing well this morning.  He denies any chest pain.He is tolerating intravenous heparin. He is awaiting a surgical opinion for consideration of coronary artery bypass surgery. Without having reviewed the films however my personal opinion is that the patient should undergo stenting of the right coronary artery and had an FFR performed at the same time of the procedure.  I did order a hemoglobin A1c and the patient has diabetes certainly coronary bypass surgery would be a good option however in the absence of diabetes mellitus and with young age of 61 years of age I feel the better option is to proceed with coronary intervention to the right coronary artery and placed the patient in medical therapy following closely and make sure that he has no residual angina.     Length of Stay: 1   Alvin Critchley [facility] 04/22/2012, 8:10 AM

## 2012-04-22 NOTE — Progress Notes (Signed)
ANTICOAGULATION CONSULT NOTE - Follow Up Consult  Pharmacy Consult for Heparin Indication: CAD s/p cath, to be evaluated for CABG  No Known Allergies  Patient Measurements: Height: 5\' 9"  (175.3 cm) Weight: 251 lb 1.6 oz (113.898 kg) IBW/kg (Calculated) : 70.7  Heparin Dosing Weight: 96.5 kg  Vital Signs: Temp: 98.3 F (36.8 C) (06/01 0500) Temp src: Oral (06/01 0500) BP: 156/84 mmHg (06/01 0500) Pulse Rate: 76  (06/01 0500)  Labs:  Basename 04/22/12 0800 04/21/12 2201  HGB -- 11.8*  HCT -- 35.0*  PLT -- 238  APTT -- 32  LABPROT -- --  INR -- --  HEPARINUNFRC 0.31 --  CREATININE -- --  CKTOTAL -- --  CKMB -- --  TROPONINI -- --    CrCl is unknown because no creatinine reading has been taken.   Medications:  Infusions:    . sodium chloride 75 mL/hr at 04/22/12 0831  . sodium chloride 75 mL/hr at 04/21/12 1404  . sodium chloride 125 mL/hr at 04/21/12 1815  . heparin 1,350 Units/hr (04/22/12 1610)    Assessment: 61 yo male found to have 2V CAD s/p cath on heparin awaiting decision for CABG vs coronary intervention. Heparin level is therapeutic. No bleeding reported.  Goal of Therapy:  Heparin level 0.3-0.7 units/ml Monitor platelets by anticoagulation protocol: Yes   Plan:  Continue heparin infusion at 1350 units/hr Check confirmatory heparin level and CBC at 14:00 Daily heparin level and CBC while on heparin  [facility], [place].D., BCPS Clinical Pharmacist 04/22/2012 9:13 AM

## 2012-04-23 LAB — BASIC METABOLIC PANEL
CO2: 25 mEq/L (ref 19–32)
Calcium: 9.2 mg/dL (ref 8.4–10.5)
GFR calc Af Amer: >90 mL/min (ref >90)
GFR calc non Af Amer: >90 mL/min (ref >90)
Sodium: 135 mEq/L (ref 135–145)

## 2012-04-23 LAB — CBC
HCT: 36.5 % — ABNORMAL LOW (ref 39.0–52.0)
Hemoglobin: 12.5 g/dL — ABNORMAL LOW (ref 13.0–17.0)
MCHC: 34.2 g/dL (ref 30.0–36.0)

## 2012-04-23 LAB — HEMOGLOBIN A1C
Hgb A1c MFr Bld: 6.3 % — ABNORMAL HIGH (ref <5.7)
Mean Plasma Glucose: 134 mg/dL — ABNORMAL HIGH (ref <117)

## 2012-04-23 LAB — HEPARIN LEVEL (UNFRACTIONATED): Heparin Unfractionated: 0.48 IU/mL (ref 0.30–0.70)

## 2012-04-23 MED ORDER — GUAIFENESIN-DM 100-10 MG/5ML PO SYRP
15.0000 mL | ORAL_SOLUTION | ORAL | Status: DC | PRN
  Administered 2012-04-23 – 2012-04-24 (×4): 15 mL via ORAL
  Filled 2012-04-23: qty 15
  Filled 2012-04-23: qty 5
  Filled 2012-04-23: qty 15
  Filled 2012-04-23: qty 10
  Filled 2012-04-23: qty 5

## 2012-04-23 MED ORDER — SODIUM CHLORIDE 0.9 % IJ SOLN
3.0000 mL | Freq: Two times a day (BID) | INTRAMUSCULAR | Status: DC
  Administered 2012-04-24: 3 mL via INTRAVENOUS

## 2012-04-23 MED ORDER — ALUM & MAG HYDROXIDE-SIMETH 200-200-20 MG/5ML PO SUSP: 15.0000 mL | ORAL | Status: DC | PRN

## 2012-04-23 MED ORDER — MAGNESIUM HYDROXIDE 400 MG/5ML PO SUSP: 30.0000 mL | Freq: Every day | ORAL | Status: DC | PRN

## 2012-04-23 MED ORDER — ASPIRIN EC 81 MG PO TBEC
81.0000 mg | DELAYED_RELEASE_TABLET | Freq: Every day | ORAL | Status: DC
Start: 2012-04-25 — End: 2012-04-25
  Administered 2012-04-25: 81 mg via ORAL
  Filled 2012-04-23: qty 1

## 2012-04-23 MED ORDER — DIAZEPAM 5 MG PO TABS
5.0000 mg | ORAL_TABLET | ORAL | Status: AC
  Administered 2012-04-24: 5 mg via ORAL
  Filled 2012-04-23: qty 1

## 2012-04-23 MED ORDER — GUAIFENESIN-DM 100-10 MG/5ML PO SYRP: 5.0000 mL | ORAL_SOLUTION | ORAL | Status: DC | PRN

## 2012-04-23 MED ORDER — SODIUM CHLORIDE 0.9 % IJ SOLN: 3.0000 mL | INTRAMUSCULAR | Status: DC | PRN

## 2012-04-23 MED ORDER — SODIUM CHLORIDE 0.9 % IV SOLN: 250.0000 mL | INTRAVENOUS | Status: DC | PRN

## 2012-04-23 MED ORDER — ASPIRIN 81 MG PO CHEW
324.0000 mg | CHEWABLE_TABLET | ORAL | Status: AC
  Administered 2012-04-24: 324 mg via ORAL
  Filled 2012-04-23: qty 4

## 2012-04-23 NOTE — Progress Notes (Signed)
ANTICOAGULATION CONSULT NOTE - Follow Up Consult  Pharmacy Consult for Heparin Indication: CAD s/p cath, to be evaluated for CABG  No Known Allergies  Patient Measurements: Height: 5\' 9"  (175.3 cm) Weight: 251 lb 1.6 oz (113.898 kg) IBW/kg (Calculated) : 70.7  Heparin Dosing Weight: 96.5 kg  Vital Signs: Temp: 98 F (36.7 C) (06/02 0500) BP: 114/70 mmHg (06/02 0943) Pulse Rate: 68  (06/02 0943)  Labs:  Basename 04/23/12 0814 04/23/12 0540 04/22/12 1410 04/22/12 0800 04/21/12 2201  HGB -- 12.5* 12.4* -- --  HCT -- 36.5* 36.6* -- 35.0*  PLT -- 233 244 -- 238  APTT -- -- -- -- 32  LABPROT -- -- -- -- --  INR -- -- -- -- --  HEPARINUNFRC -- 0.48 0.32 0.31 --  CREATININE 0.72 -- -- -- --  CKTOTAL -- -- -- -- --  CKMB -- -- -- -- --  TROPONINI -- -- -- -- --    Estimated Creatinine Clearance: 120.7 ml/min (by C-G formula based on Cr of 0.72).   Medications:  Infusions:     . sodium chloride 75 mL/hr at 04/22/12 1800  . sodium chloride 75 mL/hr at 04/21/12 1404  . heparin 1,350 Units/hr (04/22/12 1922)    Assessment: 61 yo male found to have 2V CAD s/p cath on heparin. Coronary intervention planned for tomorrow. Heparin level is therapeutic. No bleeding reported. CBC is stable.  Goal of Therapy:  Heparin level 0.3-0.7 units/ml Monitor platelets by anticoagulation protocol: Yes   Plan:  Continue heparin infusion at 1350 units/hr Daily heparin level and CBC while on heparin  [facility], [place].D., BCPS Clinical Pharmacist 04/23/2012 2:06 PM

## 2012-04-23 NOTE — Progress Notes (Signed)
Kyle Bottoms, MD, [facility] ABIM Board Certified in Adult Cardiovascular Medicine,Internal Medicine and Critical Care Medicine      Subjective:    Patient is doing well.  He has had no recurrent chest pain.  He denies any shows of breath orthopnea or PND.  He slept well with trazodone last night.    Objective:   Weight Range:  Vital Signs:   Temp:  [98 F (36.7 C)-98.1 F (36.7 C)] 98 F (36.7 C) (06/02 0500) Pulse Rate:  [68-91] 68  (06/02 0943) Resp:  [20] 20  (06/02 0500) BP: (109-125)/(65-72) 114/70 mmHg (06/02 0943) SpO2:  [96 %-98 %] 98 % (06/02 0500) Last BM Date: 04/22/12  Weight change: Filed Weights   04/21/12 1330  Weight: 251 lb 1.6 oz (113.898 kg)    Intake/Output:   Intake/Output Summary (Last 24 hours) at 04/23/12 1249 Last data filed at 04/23/12 1200  Gross per 24 hour  Intake 1690.5 ml  Output      0 ml  Net 1690.5 ml     Physical Exam: General: Well-nourished white male in no distress Neck: Normal carotid upstroke and no carotid bruits Lungs: Clear breath sounds bilaterally without any wheezing Heart regular rate and rhythm with normal S1-S2 no murmurs or gallops Abdomen: Soft nontender no rebound or guarding Extremities: No cyanosis clubbing or edema    Telemetry: normal sinus rhythm  Labs: Basic Metabolic Panel:  Lab 04/23/12 9604  NA 135  K 4.3  CL 97  CO2 25  GLUCOSE 159*  BUN 13  CREATININE 0.72  CALCIUM 9.2  MG --  PHOS --    Liver Function Tests: No results found for this basename: AST:5,ALT:5,ALKPHOS:5,BILITOT:5,PROT:5,ALBUMIN:5 in the last 168 hours No results found for this basename: LIPASE:5,AMYLASE:5 in the last 168 hours No results found for this basename: AMMONIA:3 in the last 168 hours  CBC:  Lab 04/23/12 0540 04/22/12 1410 04/21/12 2201  WBC 7.7 8.2 9.1  NEUTROABS -- -- --  HGB 12.5* 12.4* 11.8*  HCT 36.5* 36.6* 35.0*  MCV 85.3 86.7 86.8  PLT 233 244 238    Cardiac Enzymes: No results found for this  basename: CKTOTAL:5,CKMB:5,CKMBINDEX:5,TROPONINI:5 in the last 168 hours   BNP: BNP (last 3 results) No results found for this basename: PROBNP:3 in the last 8760 hours  ABG No results found for this basename: phart, pco2, pco2art, po2, po2art, hco3, tco2, acidbasedef, o2sat     Other results: EKG: normal sinus rhythm no acute changes Imaging:  No results found.   Medications:     Scheduled Medications:    . amLODipine  5 mg Oral Daily  . aspirin EC  81 mg Oral Daily  . atorvastatin  80 mg Oral q1800  . cholecalciferol  1,000 Units Oral Daily  . ciprofloxacin  500 mg Oral BID  . hydrochlorothiazide  12.5 mg Oral Daily  . isosorbide mononitrate  60 mg Oral Daily  . lisinopril  20 mg Oral Daily  . naproxen  1,000 mg Oral Q breakfast  . omega-3 acid ethyl esters  3 g Oral Daily  . pantoprazole  40 mg Oral Q1200  . traZODone  75 mg Oral QHS     Infusions:    . sodium chloride 75 mL/hr at 04/22/12 1800  . sodium chloride 75 mL/hr at 04/21/12 1404  . heparin 1,350 Units/hr (04/22/12 1922)     PRN Medications:  acetaminophen, nitroGLYCERIN, ondansetron (ZOFRAN) IV   Assessment:   #1 exertional angina  A.80-90% RCA  disease, 50% LAD disease, subtotal occlusion of the diagonal of the LAD and moderate circumflex disease , Normal LV function  B.Per Dr. Tedra Senegal evaluation by surgeon for second opinion  #2 dyslipidemia  #3 hypertension  #4 arthritis  #5 insomnia    Plan/Discussion:    Discussed the plan with Dr. Excell Seltzer.  We will proceed with stenting of the right coronary artery and FFR of the LAD.  The small diagonal lesion can be treated medically.  We will keep the patient n.p.o. Pre-PCI orders will be written.  IV fluids can continue for now     Length of Stay: 2   Kyle Lambert 04/23/2012, 12:49 PM

## 2012-04-24 ENCOUNTER — Encounter (HOSPITAL_COMMUNITY)
Admission: AD | Disposition: A | Discharge status: Home / Self Care | Payer: Self-pay | Source: Other Acute Inpatient Hospital | Attending: Cardiology

## 2012-04-24 DIAGNOSIS — I2510 Atherosclerotic heart disease of native coronary artery without angina pectoris: Secondary | ICD-10-CM

## 2012-04-24 HISTORY — PX: CORONARY ANGIOPLASTY WITH STENT PLACEMENT: SHX49

## 2012-04-24 HISTORY — PX: PERCUTANEOUS CORONARY STENT INTERVENTION (PCI-S): SHX5485

## 2012-04-24 LAB — POCT ACTIVATED CLOTTING TIME: Activated Clotting Time: 358 seconds

## 2012-04-24 LAB — CBC
MCV: 86.4 fL (ref 78.0–100.0)
Platelets: 237 10*3/uL (ref 150–400)
RBC: 4.05 MIL/uL — ABNORMAL LOW (ref 4.22–5.81)
RDW: 13.8 % (ref 11.5–15.5)
WBC: 7 10*3/uL (ref 4.0–10.5)

## 2012-04-24 LAB — GLUCOSE, CAPILLARY: Glucose-Capillary: 146 mg/dL — ABNORMAL HIGH (ref 70–99)

## 2012-04-24 LAB — PROTIME-INR: Prothrombin Time: 13.9 seconds (ref 11.6–15.2)

## 2012-04-24 LAB — HEPARIN LEVEL (UNFRACTIONATED): Heparin Unfractionated: 0.34 IU/mL (ref 0.30–0.70)

## 2012-04-24 SURGERY — PERCUTANEOUS CORONARY STENT INTERVENTION (PCI-S)

## 2012-04-24 SURGERY — PERCUTANEOUS CORONARY STENT INTERVENTION (PCI-S)
Anesthesia: LOCAL

## 2012-04-24 MED ORDER — FENTANYL CITRATE 0.05 MG/ML IJ SOLN
INTRAMUSCULAR | Status: AC
  Filled 2012-04-24: qty 2

## 2012-04-24 MED ORDER — LIDOCAINE HCL (PF) 1 % IJ SOLN
INTRAMUSCULAR | Status: AC
  Filled 2012-04-24: qty 30

## 2012-04-24 MED ORDER — ONDANSETRON HCL 4 MG/2ML IJ SOLN: 4.0000 mg | Freq: Four times a day (QID) | INTRAMUSCULAR | Status: DC | PRN

## 2012-04-24 MED ORDER — BIVALIRUDIN 250 MG IV SOLR
INTRAVENOUS | Status: AC
  Filled 2012-04-24: qty 250

## 2012-04-24 MED ORDER — CLOPIDOGREL BISULFATE 75 MG PO TABS
75.0000 mg | ORAL_TABLET | Freq: Every day | ORAL | Status: DC
  Administered 2012-04-25: 75 mg via ORAL
  Filled 2012-04-24: qty 1

## 2012-04-24 MED ORDER — MIDAZOLAM HCL 2 MG/2ML IJ SOLN
INTRAMUSCULAR | Status: AC
  Filled 2012-04-24: qty 2

## 2012-04-24 MED ORDER — SODIUM CHLORIDE 0.9 % IV SOLN
1.0000 mL/kg/h | INTRAVENOUS | Status: AC
  Administered 2012-04-24 (×2): 1 mL/kg/h via INTRAVENOUS

## 2012-04-24 MED ORDER — SODIUM CHLORIDE 0.9 % IV SOLN
0.2500 mg/kg/h | INTRAVENOUS | Status: DC
  Filled 2012-04-24: qty 250

## 2012-04-24 MED ORDER — CLOPIDOGREL BISULFATE 300 MG PO TABS
600.0000 mg | ORAL_TABLET | Freq: Once | ORAL | Status: AC
  Administered 2012-04-24: 600 mg via ORAL
  Filled 2012-04-24 (×2): qty 2

## 2012-04-24 MED ORDER — SODIUM CHLORIDE 0.9 % IJ SOLN: 3.0000 mL | INTRAMUSCULAR | Status: DC | PRN

## 2012-04-24 MED ORDER — HEPARIN (PORCINE) IN NACL 2-0.9 UNIT/ML-% IJ SOLN
INTRAMUSCULAR | Status: AC
  Filled 2012-04-24: qty 2000

## 2012-04-24 MED ORDER — SODIUM CHLORIDE 0.9 % IJ SOLN: 3.0000 mL | Freq: Two times a day (BID) | INTRAMUSCULAR | Status: DC

## 2012-04-24 MED ORDER — SODIUM CHLORIDE 0.9 % IV SOLN: 250.0000 mL | INTRAVENOUS | Status: DC

## 2012-04-24 MED ORDER — ADENOSINE 12 MG/4ML IV SOLN
16.0000 mL | Freq: Once | INTRAVENOUS | Status: DC
  Filled 2012-04-24: qty 16

## 2012-04-24 MED ORDER — NITROGLYCERIN 0.2 MG/ML ON CALL CATH LAB
INTRAVENOUS | Status: AC
  Filled 2012-04-24: qty 1

## 2012-04-24 MED ORDER — ACETAMINOPHEN 325 MG PO TABS: 650.0000 mg | ORAL_TABLET | ORAL | Status: DC | PRN

## 2012-04-24 NOTE — Progress Notes (Signed)
PROGRESS NOTE  Subjective:   Pt has done well over the weekend.  Objective:    Vital Signs:   Temp:  [97.7 F (36.5 C)-98.3 F (36.8 C)] 98.3 F (36.8 C) (06/03 0500) Pulse Rate:  [68-76] 76  (06/03 0500) Resp:  [18-20] 18  (06/03 0500) BP: (103-125)/(57-70) 125/66 mmHg (06/03 0500) SpO2:  [93 %-100 %] 97 % (06/03 0500)  Last BM Date: 04/22/12   24-hour weight change: Weight change:   Weight trends: Filed Weights   04/21/12 1330  Weight: 251 lb 1.6 oz (113.898 kg)    Intake/Output:  06/02 0701 - 06/03 0700 In: 2546.5 [P.O.:960; I.V.:1586.5] Out: 400 [Urine:400]     Physical Exam: BP 125/66  Pulse 76  Temp(Src) 98.3 F (36.8 C) (Oral)  Resp 18  Ht 5\' 9"  (1.753 m)  Wt 251 lb 1.6 oz (113.898 kg)  BMI 37.08 kg/m2  SpO2 97%  General: Vital signs reviewed and noted. Well-developed, well-nourished, in no acute distress; alert, appropriate and cooperative .  Head: Normocephalic, atraumatic.  Eyes: conjunctivae/corneas clear. PERRL, EOM's intact.   Throat: normal  Neck: Supple. Normal carotids. No JVD  Lungs:  Clear bilaterally to auscultation  Heart: Regular rate,  With normal  S1 S2. No murmurs, gallops or rubs  Abdomen:  Soft, non-tender, non-distended with normoactive bowel sounds. No hepatomegaly. No rebound/guarding. No abdominal masses.  Extremities: Distal pedal pulses are 2+ .  No edema.  Groin is OK.  Neurologic: A&O X3, CN II - XII are grossly intact. Motor strength is 5/5 in the all 4 extremities.  Psych: Responds to questions appropriately with normal affect.    Labs: BMET:  Basename 04/23/12 0814  NA 135  K 4.3  CL 97  CO2 25  GLUCOSE 159*  BUN 13  CREATININE 0.72  CALCIUM 9.2  MG --  PHOS --    Liver function tests: No results found for this basename: AST:2,ALT:2,ALKPHOS:2,BILITOT:2,PROT:2,ALBUMIN:2 in the last 72 hours No results found for this basename: LIPASE:2,AMYLASE:2 in the last 72 hours  CBC:  Basename 04/24/12 0650  04/23/12 0540  WBC 7.0 7.7  NEUTROABS -- --  HGB 11.8* 12.5*  HCT 35.0* 36.5*  MCV 86.4 85.3  PLT 237 233    Cardiac Enzymes: No results found for this basename: CKTOTAL:4,CKMB:4,TROPONINI:4 in the last 72 hours  Coagulation Studies:  Basename 04/24/12 0650  LABPROT 13.9  INR 1.05    Other: No components found with this basename: POCBNP:3 No results found for this basename: DDIMER in the last 72 hours  Basename 04/23/12 0814  HGBA1C 6.3*   No results found for this basename: CHOL,HDL,LDLCALC,TRIG,CHOLHDL in the last 72 hours No results found for this basename: TSH,T4TOTAL,FREET3,T3FREE,THYROIDAB in the last 72 hours No results found for this basename: VITAMINB12,FOLATE,FERRITIN,TIBC,IRON,RETICCTPCT in the last 72 hours    Tele:  NSR  Medications:    Infusions:    . sodium chloride 75 mL/hr at 04/24/12 0520  . sodium chloride 75 mL/hr at 04/21/12 1404  . heparin 1,350 Units/hr (04/23/12 1507)    Scheduled Medications:    . amLODipine  5 mg Oral Daily  . aspirin  324 mg Oral Pre-Cath  . aspirin EC  81 mg Oral Daily  . atorvastatin  80 mg Oral q1800  . cholecalciferol  1,000 Units Oral Daily  . ciprofloxacin  500 mg Oral BID  . clopidogrel  600 mg Oral Once  . diazepam  5 mg Oral On Call  . hydrochlorothiazide  12.5 mg Oral Daily  .  isosorbide mononitrate  60 mg Oral Daily  . lisinopril  20 mg Oral Daily  . naproxen  1,000 mg Oral Q breakfast  . omega-3 acid ethyl esters  3 g Oral Daily  . pantoprazole  40 mg Oral Q1200  . sodium chloride  3 mL Intravenous Q12H  . traZODone  75 mg Oral QHS  . DISCONTD: aspirin EC  81 mg Oral Daily    Assessment/ Plan:    Unstable angina (04/21/2012)  for PCI today with Dr. Excell Seltzer   HTN (hypertension) (04/21/2012)  BP is normal.   Disposition: for cath today Length of Stay: 3  Vesta Mixer, [name], MD, [facility] 04/24/2012, 8:17 AM Office [phone] Pager [phone]

## 2012-04-24 NOTE — Care Management Note (Unsigned)
    Page 1 of 1   04/24/2012     11:04:28 AM   CARE MANAGEMENT NOTE 04/24/2012  Patient:  Kyle Lambert, Kyle Lambert   Account Number:  [id number]  Date Initiated:  04/24/2012  Documentation initiated by:  GRAVES-BIGELOW,Vadie Principato  Subjective/Objective Assessment:   Pt admitted with cp from MD office. Urgent cath  and plan for PCI today.     Action/Plan:   CM will continue to monitor for disposition needs post pci for medicaiton assist if any.   Anticipated DC Date:  04/25/2012   Anticipated DC Plan:  HOME/SELF CARE      DC Planning Services  CM consult      Choice offered to / List presented to:             Status of service:  In process, will continue to follow Medicare Important Message given?   (If response is "NO", the following Medicare IM given date fields will be blank) Date Medicare IM given:   Date Additional Medicare IM given:    Discharge Disposition:    Per UR Regulation:  Reviewed for med. necessity/level of care/duration of stay  If discussed at Long Length of Stay Meetings, dates discussed:    Comments:

## 2012-04-24 NOTE — CV Procedure (Signed)
   CARDIAC CATH NOTE  Name: Kyle Lambert MRN: [id number] DOB: [birth year]  Procedure: PTCA and stenting of the RCA, FFR of the LAD  Indication: Unstable angina. 61 year old gentleman who presented with unstable angina and underwent diagnostic catheterization last week. He was found to have severe proximal RCA stenosis, moderate proximal LAD stenosis, and subtotal occlusion of a tiny high diagonal branch. After review of his films, we elected to treat the right coronary artery with stenting, performed pressure wire analysis of the proximal LAD, and treat the high diagonal medically because it is a very small diameter vessel.  Procedural Details: The right wrist was prepped, draped, and anesthetized with 1% lidocaine. Using the modified Seldinger technique, a 6 Fr sheath was introduced into the radial artery. 2.5 mg nicardipine was administered through the radial sheath. Weight-based bivalirudin was given for anticoagulation. Once a therapeutic ACT was achieved, a 6 [place] JR 4 guide catheter was inserted.  A cougar coronary guidewire was used to cross the lesion.  The lesion was predilated with a 2.5 x 15 mm balloon.  The lesion was then stented with a 3.0 x 24 mm Promus element drug-eluting stent.  The stent was postdilated with a 3.25 x 12 mm [place] Quantum apex noncompliant balloon to 18 atmospheres on 2 inflations.  Following PCI, there was 0% residual stenosis and TIMI-3 flow. Final angiography confirmed an excellent result.   Attention was then turned to the LAD. An XB LAD 3.5 cm guide catheter was inserted. A pressure wire was advanced across the area of moderate stenosis in the proximal vessel. The wire had been appropriately normalized at the end of the guide catheter. Intravenous adenosine was administered. The FFR at peak hyperemia was 0.85. The guidewire was removed and angiography demonstrated stable appearance of the LAD. The patient tolerated the procedure well. There were no immediate  procedural complications. A TR band was used for radial hemostasis. The patient was transferred to the post catheterization recovery area for further monitoring.  Lesion Data: Vessel: RCA/proximal Percent stenosis (pre): 90 TIMI-flow (pre):  3 Stent:  3.0 x 24 mm DES Percent stenosis (post): 0 TIMI-flow (post): 3  Conclusions:  1. Successful stenting of severe stenosis in the proximal right coronary artery 2. Negative pressure wire analysis of the proximal LAD  Recommendations: Dual antiplatelet therapy with aspirin and Plavix for at least 12 months. Aggressive medical therapy.  Tonny Bollman 04/24/2012, 5:46 PM

## 2012-04-24 NOTE — Interval H&P Note (Signed)
History and Physical Interval Note:  04/24/2012 3:46 PM  Kyle Lambert  has presented today for surgery, with the diagnosis of CAD  The various methods of treatment have been discussed with the patient and family. After consideration of risks, benefits and other options for treatment, the patient has consented to  Procedure(s) (LRB): PERCUTANEOUS CORONARY STENT INTERVENTION (PCI-S) (N/A) as a surgical intervention .  The patients' history has been reviewed, patient examined, no change in status, stable for surgery.  I have reviewed the patients' chart and labs.  Questions were answered to the patient's satisfaction.     Tonny Bollman  04/24/2012 3:46 PM

## 2012-04-24 NOTE — Progress Notes (Signed)
Admit Complaint: 60 YOM exertional CP x 3 weeks (under immense stress). Found to have mod-severe CAD (primarily in LAD and RCA) with cath 5/31  Anticoagulation: heparin gtt for CAD: HL at goal (0.34). Pt on high dose of NSAID (sticky note). No bleeding noted. H/H down a little to 11.8/35. PCI today.  Plan:  1) Continue 1350/hr; f/u after PCI 2) Monitor daily heparin level, H/H and platelet.

## 2012-04-24 NOTE — Progress Notes (Signed)
UR Completed Jovaughn Wojtaszek Graves-Bigelow, RN,BSN [phone]  

## 2012-04-24 NOTE — Progress Notes (Signed)
TR BAND REMOVAL  LOCATION:    right radial  DEFLATED PER PROTOCOL:    yes  TIME BAND OFF / DRESSING APPLIED:    08:00 PM   SITE UPON ARRIVAL:    Level 0  SITE AFTER BAND REMOVAL:    Level 0  REVERSE ALLEN'S TEST:     positive  CIRCULATION SENSATION AND MOVEMENT:    Within Normal Limits   yes  COMMENTS:   Pt stable

## 2012-04-24 NOTE — H&P (View-Only) (Signed)
 PROGRESS NOTE  Subjective:   Pt has done well over the weekend.  Objective:    Vital Signs:   Temp:  [97.7 F (36.5 C)-98.3 F (36.8 C)] 98.3 F (36.8 C) (06/03 0500) Pulse Rate:  [68-76] 76  (06/03 0500) Resp:  [18-20] 18  (06/03 0500) BP: (103-125)/(57-70) 125/66 mmHg (06/03 0500) SpO2:  [93 %-100 %] 97 % (06/03 0500)  Last BM Date: 04/22/12   24-hour weight change: Weight change:   Weight trends: Filed Weights   04/21/12 1330  Weight: 251 lb 1.6 oz (113.898 kg)    Intake/Output:  06/02 0701 - 06/03 0700 In: 2546.5 [P.O.:960; I.V.:1586.5] Out: 400 [Urine:400]     Physical Exam: BP 125/66  Pulse 76  Temp(Src) 98.3 F (36.8 C) (Oral)  Resp 18  Ht 5' 9" (1.753 m)  Wt 251 lb 1.6 oz (113.898 kg)  BMI 37.08 kg/m2  SpO2 97%  General: Vital signs reviewed and noted. Well-developed, well-nourished, in no acute distress; alert, appropriate and cooperative .  Head: Normocephalic, atraumatic.  Eyes: conjunctivae/corneas clear. PERRL, EOM's intact.   Throat: normal  Neck: Supple. Normal carotids. No JVD  Lungs:  Clear bilaterally to auscultation  Heart: Regular rate,  With normal  S1 S2. No murmurs, gallops or rubs  Abdomen:  Soft, non-tender, non-distended with normoactive bowel sounds. No hepatomegaly. No rebound/guarding. No abdominal masses.  Extremities: Distal pedal pulses are 2+ .  No edema.  Groin is OK.  Neurologic: A&O X3, CN II - XII are grossly intact. Motor strength is 5/5 in the all 4 extremities.  Psych: Responds to questions appropriately with normal affect.    Labs: BMET:  Basename 04/23/12 0814  NA 135  K 4.3  CL 97  CO2 25  GLUCOSE 159*  BUN 13  CREATININE 0.72  CALCIUM 9.2  MG --  PHOS --    Liver function tests: No results found for this basename: AST:2,ALT:2,ALKPHOS:2,BILITOT:2,PROT:2,ALBUMIN:2 in the last 72 hours No results found for this basename: LIPASE:2,AMYLASE:2 in the last 72 hours  CBC:  Basename 04/24/12 0650  04/23/12 0540  WBC 7.0 7.7  NEUTROABS -- --  HGB 11.8* 12.5*  HCT 35.0* 36.5*  MCV 86.4 85.3  PLT 237 233    Cardiac Enzymes: No results found for this basename: CKTOTAL:4,CKMB:4,TROPONINI:4 in the last 72 hours  Coagulation Studies:  Basename 04/24/12 0650  LABPROT 13.9  INR 1.05    Other: No components found with this basename: POCBNP:3 No results found for this basename: DDIMER in the last 72 hours  Basename 04/23/12 0814  HGBA1C 6.3*   No results found for this basename: CHOL,HDL,LDLCALC,TRIG,CHOLHDL in the last 72 hours No results found for this basename: TSH,T4TOTAL,FREET3,T3FREE,THYROIDAB in the last 72 hours No results found for this basename: VITAMINB12,FOLATE,FERRITIN,TIBC,IRON,RETICCTPCT in the last 72 hours    Tele:  NSR  Medications:    Infusions:    . sodium chloride 75 mL/hr at 04/24/12 0520  . sodium chloride 75 mL/hr at 04/21/12 1404  . heparin 1,350 Units/hr (04/23/12 1507)    Scheduled Medications:    . amLODipine  5 mg Oral Daily  . aspirin  324 mg Oral Pre-Cath  . aspirin EC  81 mg Oral Daily  . atorvastatin  80 mg Oral q1800  . cholecalciferol  1,000 Units Oral Daily  . ciprofloxacin  500 mg Oral BID  . clopidogrel  600 mg Oral Once  . diazepam  5 mg Oral On Call  . hydrochlorothiazide  12.5 mg Oral Daily  .   isosorbide mononitrate  60 mg Oral Daily  . lisinopril  20 mg Oral Daily  . naproxen  1,000 mg Oral Q breakfast  . omega-3 acid ethyl esters  3 g Oral Daily  . pantoprazole  40 mg Oral Q1200  . sodium chloride  3 mL Intravenous Q12H  . traZODone  75 mg Oral QHS  . DISCONTD: aspirin EC  81 mg Oral Daily    Assessment/ Plan:    Unstable angina (04/21/2012)  for PCI today with Dr. Cooper   HTN (hypertension) (04/21/2012)  BP is normal.   Disposition: for cath today Length of Stay: 3  Cauy Melody J. Darianna Amy, Jr., MD, FACC 04/24/2012, 8:17 AM Office [phone] Pager [phone]    

## 2012-04-25 ENCOUNTER — Encounter (HOSPITAL_COMMUNITY): Payer: Self-pay | Admitting: Nurse Practitioner

## 2012-04-25 DIAGNOSIS — E782 Mixed hyperlipidemia: Secondary | ICD-10-CM

## 2012-04-25 DIAGNOSIS — I200 Unstable angina: Secondary | ICD-10-CM

## 2012-04-25 DIAGNOSIS — E785 Hyperlipidemia, unspecified: Secondary | ICD-10-CM

## 2012-04-25 DIAGNOSIS — I2510 Atherosclerotic heart disease of native coronary artery without angina pectoris: Secondary | ICD-10-CM

## 2012-04-25 LAB — CBC
HCT: 33.1 % — ABNORMAL LOW (ref 39.0–52.0)
Hemoglobin: 11.1 g/dL — ABNORMAL LOW (ref 13.0–17.0)
MCHC: 33.5 g/dL (ref 30.0–36.0)
RBC: 3.83 MIL/uL — ABNORMAL LOW (ref 4.22–5.81)

## 2012-04-25 LAB — BASIC METABOLIC PANEL
BUN: 11 mg/dL (ref 6–23)
CO2: 24 mEq/L (ref 19–32)
GFR calc non Af Amer: >90 mL/min (ref >90)
Glucose, Bld: 107 mg/dL — ABNORMAL HIGH (ref 70–99)
Potassium: 3.9 mEq/L (ref 3.5–5.1)

## 2012-04-25 MED ORDER — ISOSORBIDE MONONITRATE ER 60 MG PO TB24: 60.0000 mg | ORAL_TABLET | Freq: Every day | ORAL | Status: DC

## 2012-04-25 MED ORDER — NITROGLYCERIN 0.4 MG SL SUBL: 0.4000 mg | SUBLINGUAL_TABLET | SUBLINGUAL | Status: DC | PRN

## 2012-04-25 MED ORDER — CLOPIDOGREL BISULFATE 75 MG PO TABS: 75.0000 mg | ORAL_TABLET | Freq: Every day | ORAL | Status: DC

## 2012-04-25 MED ORDER — AMLODIPINE BESYLATE 5 MG PO TABS: 5.0000 mg | ORAL_TABLET | Freq: Every day | ORAL | Status: DC

## 2012-04-25 MED ORDER — ASPIRIN 81 MG PO TBEC: 81.0000 mg | DELAYED_RELEASE_TABLET | Freq: Every day | ORAL | Status: AC

## 2012-04-25 MED FILL — Nicardipine HCl IV Soln 2.5 MG/ML: INTRAVENOUS | Qty: 1 | Status: AC

## 2012-04-25 MED FILL — Dextrose Inj 5%: INTRAVENOUS | Qty: 50 | Status: AC

## 2012-04-25 NOTE — Progress Notes (Signed)
CARDIAC REHAB PHASE I   PRE:  Rate/Rhythm: 94 SR  BP:  Supine:   Sitting: 114/82  Standing:    SaO2:   MODE:  Ambulation: 680 ft   POST:  Rate/Rhythem: 104 ST  BP:  Supine:   Sitting: 134/74  Standing:    SaO2:  0900-1000 Pt tolerated ambulation well without c/o of cp or SOB. VS stable,gait steady. Completed discharge education with pt. He agrees to [employer]. CRP in [place], will send referral.  Kyle Lambert

## 2012-04-25 NOTE — Discharge Summary (Signed)
Patient seen and examined and history reviewed. Agree with above findings and plan. Patient has no chest pain post intervention. Cath site is without hematoma. Reviewed medication recommendations. Encouraged enrollment in cardiac Rehab.   Kyle Lambert [facility] 04/25/2012 1:16 PM

## 2012-04-25 NOTE — Discharge Summary (Signed)
Patient ID: Kyle Lambert,  MRN: [id number], DOB/AGE: 07/25/1951 61 y.o.  Admit date: 04/21/2012 Discharge date: 04/25/2012  Primary Cardiologist: M. Excell Seltzer, MD  Discharge Diagnoses Principal Problem:  *Unstable angina Active Problems:  HTN (hypertension)  CAD (coronary artery disease)  **S/P PCI/DES to the RCA this admission with normal FFR of the LAD.  Hyperlipidemia   Allergies No Known Allergies  Procedures  Cardiac Catheterization 04/21/2012  Hemodynamics:    LV pressure: 124/14 Aortic pressure: 114/78  Angiography    Left Main: short LM.  Left anterior Descending: calcified, moderate stenosis of 50%.  There are moderate irregularities and then a 80-90% stenosis in the mid/distal LAD. There is a moderate sized diagonal that originates from the proximal 50% LAD  Stneosis.  This diagonal is subtotally occluded and has sluggish flow.     Left Circumflex: large system.  There are mild-moderate  irregularities throughout.  There is a large 1st OM branch  Right Coronary Artery: large and dominant.  There are  proximal 60% and then an 80 % stneosis in the proximal vessel.  The distal RCA has moderate diffuse irregularities  LV Gram: normal LV systolic function with EF of 65% _____________  Percutaneous Intervention and Fractional Flow Reserve Measurement 04/24/2012  The RCA was successfully stented with a 3.0 x 24mm Promus Element DES.  FFR of the LAD was performed and was normal @ 0.85 ->Med Rx. _____________  History of Present Illness  61 year old male without prior cardiac history who was in his usual state of health until approximately 3 weeks prior to admission when he began to experience exertional chest pain. He was seen by his primary care provider and subsequently referred to [facility]. There, he was seen by cardiology and decision was made to transfer to [facility] for diagnostic catheterization.  Hospital Course  Following arrival to [facility],  patient had no further chest pain. He underwent diagnostic cardiac catheterization on May 31, revealing significant right coronary artery and LAD disease. It was initially felt that surgical opinion would be necessary.  Patient had no recurrent chest discomfort over the weekend and films were reviewed by the interventional team and decision was made to pursue percutaneous intervention of the right coronary artery with measurement of fractional flow reserve within the LAD. This was carried out on June 3 on the right coronary artery was successfully stented with a Promus drug-eluting stent as outlined above. Fractional flow reserve of the LAD was normal at 0.85. Patient tolerated this procedure well and post procedure has been ambulating without recurrent symptoms or limitations. He will be discharged home today in good condition.  Discharge Vitals Blood pressure 99/53, pulse 82, temperature 98 F (36.7 C), temperature source Oral, resp. rate 18, height 5\' 9"  (1.753 m), weight 262 lb 12.6 oz (119.2 kg), SpO2 94.00%.  Filed Weights   04/21/12 1330 04/25/12 0500  Weight: 251 lb 1.6 oz (113.898 kg) 262 lb 12.6 oz (119.2 kg)   Labs  CBC  Basename 04/25/12 0425 04/24/12 0650  WBC 6.6 7.0  NEUTROABS -- --  HGB 11.1* 11.8*  HCT 33.1* 35.0*  MCV 86.4 86.4  PLT 234 237   Basic Metabolic Panel  Basename 04/25/12 0425 04/23/12 0814  NA 135 135  K 3.9 4.3  CL 101 97  CO2 24 25  GLUCOSE 107* 159*  BUN 11 13  CREATININE 0.73 0.72  CALCIUM 8.6 9.2  MG -- --  PHOS -- --   Hemoglobin A1C  Basename 04/23/12 0814  HGBA1C 6.3*   Disposition  Pt is being discharged home today in good condition.  Follow-up Plans & Appointments  Follow-up Information    Follow up with Nicolasa Ducking, NP on 05/16/2012. (9:30 AM - Dr. Earmon Phoenix Nurse Practitioner)    Contact information:   [address] [place] [address] [phone]         Discharge  Medications  Medication List  As of 04/25/2012 12:56 PM   STOP taking these medications         naproxen 500 MG tablet      OVER THE COUNTER MEDICATION - Goody's Powder         TAKE these medications         amLODipine 5 MG tablet   Commonly known as: NORVASC   Take 1 tablet (5 mg total) by mouth daily.      aspirin 81 MG EC tablet   Take 1 tablet (81 mg total) by mouth daily.      cholecalciferol 1000 UNITS tablet   Commonly known as: VITAMIN D   Take 1,000 Units by mouth daily.      clopidogrel 75 MG tablet   Commonly known as: PLAVIX   Take 1 tablet (75 mg total) by mouth daily with breakfast.      Fish Oil 1200 MG Caps   Take 3 capsules by mouth daily.      isosorbide mononitrate 60 MG 24 hr tablet   Commonly known as: IMDUR   Take 1 tablet (60 mg total) by mouth daily.      lisinopril-hydrochlorothiazide 20-12.5 MG per tablet   Commonly known as: PRINZIDE,ZESTORETIC   Take 1 tablet by mouth daily.      nitroGLYCERIN 0.4 MG SL tablet   Commonly known as: NITROSTAT   Place 1 tablet (0.4 mg total) under the tongue every 5 (five) minutes x 3 doses as needed for chest pain.      simvastatin 10 MG tablet   Commonly known as: ZOCOR   Take 10 mg by mouth at bedtime.           Outstanding Labs/Studies  None  Duration of Discharge Encounter   Greater than 30 minutes including physician time.  Signed, Nicolasa Ducking NP 04/25/2012, 12:56 PM

## 2012-04-25 NOTE — Discharge Instructions (Signed)
***PLEASE REMEMBER TO BRING ALL OF YOUR MEDICATIONS TO EACH OF YOUR FOLLOW-UP OFFICE VISITS.  Radial Site Care Refer to this sheet in the next few weeks. These instructions provide you with information on caring for yourself after your procedure. Your caregiver may also give you more specific instructions. Your treatment has been planned according to current medical practices, but problems sometimes occur. Call your caregiver if you have any problems or questions after your procedure. HOME CARE INSTRUCTIONS  You may shower the day after the procedure.Remove the bandage (dressing) and gently wash the site with plain soap and water.Gently pat the site dry.   Do not apply powder or lotion to the site.   Do not submerge the affected site in water for 3 to 5 days.   Inspect the site at least twice daily.   Do not flex or bend the affected arm for 24 hours.   No lifting over 5 pounds (2.3 kg) for 5 days after your procedure.   Do not drive home if you are discharged the same day of the procedure. Have someone else drive you.   What to expect:  Any bruising will usually fade within 1 to 2 weeks.   Blood that collects in the tissue (hematoma) may be painful to the touch. It should usually decrease in size and tenderness within 1 to 2 weeks.  SEEK IMMEDIATE MEDICAL CARE IF:  You have unusual pain at the radial site.   You have redness, warmth, swelling, or pain at the radial site.   You have drainage (other than a small amount of blood on the dressing).   You have chills.   You have a fever or persistent symptoms for more than 72 hours.   You have a fever and your symptoms suddenly get worse.   Your arm becomes pale, cool, tingly, or numb.   You have heavy bleeding from the site. Hold pressure on the site.      Aspirin and Your Heart Aspirin affects the way your blood clots and helps "thin" the blood. Aspirin has many uses in heart disease. It may be used as a primary  prevention to help reduce the risk of heart related events. It also can be used as a secondary measure to prevent more heart attacks or to prevent additional damage from blood clots.  ASPIRIN MAY HELP IF YOU:  Have had a heart attack or chest pain.   Have undergone open heart surgery such as CABG (Coronary Artery Bypass Surgery).   Have had coronary angioplasty with or without stents.   Have experienced a stroke or TIA (transient ischemic attack).   Have peripheral vascular disease (PAD).   Have chronic heart rhythm problems such as atrial fibrillation.   Are at risk for heart disease.  BEFORE STARTING ASPIRIN Before you start taking aspirin, your caregiver will need to review your medical history. Many things will need to be taken into consideration, such as:  Smoking status.   Blood pressure.   Diabetes.   Gender.   Weight.   Cholesterol level.  ASPIRIN DOSES  Aspirin should only be taken on the advice of your caregiver. Talk to your caregiver about how much aspirin you should take. Aspirin comes in different doses such as:   81 mg.   162 mg.   325 mg.   The aspirin dose you take may be affected by many factors, some of which include:   Your current medications, especially if your are taking blood-thinners or anti-platelet  medicine.   Liver function.   Heart disease risk.   Age.   Aspirin comes in two forms:   Non-enteric-coated. This type of aspirin does not have a coating and is absorbed faster. Non-enteric coated aspirin is recommended for patients experiencing chest pain symptoms. This type of aspirin also comes in a chewable form.   Enteric-coated. This means the aspirin has a special coating that releases the medicine very slowly. Enteric-coated aspirin causes less stomach upset. This type of aspirin should not be chewed or crushed.  ASPIRIN SIDE EFFECTS Daily use of aspirin can increase your risk of serious side effects, some of these  include:  Increased bleeding. This can range from a cut that does not stop bleeding to more serious problems such as stomach bleeding or bleeding into the brain (Intracerebral bleeding).   Increased bruising.   Stomach upset.   An allergic reaction such as red, itchy skin.   Increased risk of bleeding when combined with non-steroidal anti-inflammatory medicine (NSAIDS).   Alcohol should be drank in moderation when taking aspirin. Alcohol can increase the risk of stomach bleeding when taken with aspirin.   Aspirin should not be given to children less than 45 years of age due to the association of Reye syndrome. Reye syndrome is a serious illness that can affect the brain and liver. Studies have linked Reye syndrome with aspirin use in children.   People that have nasal polyps have an increased risk of developing an aspirin allergy.  SEEK MEDICAL CARE IF:   You develop an allergic reaction such as:   Hives.   Itchy skin.   Swelling of the lips, tongue or face.   You develop stomach pain.   You have unusual bleeding or bruising.   You have ringing in your ears.  SEEK IMMEDIATE MEDICAL CARE IF:   You have severe chest pain, especially if the pain is crushing or pressure-like and spreads to the arms, back, neck, or jaw. THIS IS AN EMERGENCY. Do not wait to see if the pain will go away. Get medical help at once. Call your local emergency services (911 in the [place] DO NOT drive yourself to the hospital.   You have stroke-like symptoms such as:   Loss of vision.   Difficulty talking.   Numbness or weakness on one side of your body.   Numbness or weakness in your arm or leg.   Not thinking clearly or feeling confused.   Your bowel movements are bloody, dark red or black in color.   You vomit or cough up blood.   You have blood in your urine.   You have shortness of breath, coughing or wheezing.  MAKE SURE YOU:   Understand these instructions.   Will monitor your  condition.   Seek immediate medical care if necessary.  Document Released: 10/21/2008 Document Revised: 10/28/2011 Document Reviewed: 10/21/2008 [facility] Patient Information 2012 [place], [place].

## 2012-04-27 ENCOUNTER — Other Ambulatory Visit: Payer: Self-pay | Admitting: *Deleted

## 2012-04-27 NOTE — Telephone Encounter (Signed)
NEEDED CLARIFICATION ON IMDUR, AMLODIPINE, REQUESTED 90 DAY SUPPLY/ GIVEN R#1

## 2012-04-27 NOTE — Telephone Encounter (Signed)
Opened in Error.

## 2012-05-16 ENCOUNTER — Encounter: Payer: Self-pay | Admitting: Nurse Practitioner

## 2012-05-16 ENCOUNTER — Ambulatory Visit (INDEPENDENT_AMBULATORY_CARE_PROVIDER_SITE_OTHER): Payer: BC Managed Care – PPO | Admitting: Nurse Practitioner

## 2012-05-16 VITALS — BP 126/86 | HR 60 | Ht 69.0 in | Wt 245.0 lb

## 2012-05-16 DIAGNOSIS — E785 Hyperlipidemia, unspecified: Secondary | ICD-10-CM

## 2012-05-16 DIAGNOSIS — I208 Other forms of angina pectoris: Secondary | ICD-10-CM

## 2012-05-16 DIAGNOSIS — I209 Angina pectoris, unspecified: Secondary | ICD-10-CM

## 2012-05-16 DIAGNOSIS — I10 Essential (primary) hypertension: Secondary | ICD-10-CM

## 2012-05-16 DIAGNOSIS — I2510 Atherosclerotic heart disease of native coronary artery without angina pectoris: Secondary | ICD-10-CM

## 2012-05-16 DIAGNOSIS — E669 Obesity, unspecified: Secondary | ICD-10-CM | POA: Insufficient documentation

## 2012-05-16 MED ORDER — ISOSORBIDE MONONITRATE ER 60 MG PO TB24: ORAL_TABLET | ORAL | Status: DC

## 2012-05-16 NOTE — Progress Notes (Signed)
Patient Name: Kyle Lambert Date of Encounter: 05/16/2012  Primary Care Provider:  No primary provider on file. Primary Cardiologist:  M. Excell Seltzer, MD  Patient Profile  61 year old male with recent admission for unstable angina and stenting of the RCA who presents for followup.  Problem List   Past Medical History  Diagnosis Date  . CAD (coronary artery disease)     a. 03/2012 Cath: LAD 80, RCA 80, nl EF;  b. PCI of RCA w 3.0x65mm Promus DES, FFR of LAD was nl @ 0.85 ->Med Rx.  Marland Kitchen Hypertension   . Arthritis   . Hyperlipidemia   . Obesity    Past Surgical History  Procedure Date  . Hernia repair   . Lithotripsy     Allergies  No Known Allergies  HPI  62 year old male with the above problem list.  He was recently admitted to [facility] secondary to chest pain.  He ruled out for MI.  Catheterization revealed significant RCA and LAD disease.  The RCA was felt to be the culprit vessel and this was successfully stented with a drug-eluting stent.  Patient also underwent flow wire measurement in the LAD and this was felt to be acceptable at 0.85.  He was placed on long acting nitrate therapy and subsequently discharged home.  Since his discharge, he has been walking between 20 and 40 minutes daily.  On 2 occasions he did have mild chest tightness for which he stopped walking and symptoms resolved promptly.  He dictated nitroglycerin on one of those occasions.  He has not had any symptoms at rest.  Home Medications  Prior to Admission medications   Medication Sig Start Date End Date Taking? Authorizing Provider  amLODipine (NORVASC) 5 MG tablet Take 1 tablet (5 mg total) by mouth daily. 04/25/12 04/25/13 Yes Ok Anis, NP  aspirin EC 81 MG EC tablet Take 1 tablet (81 mg total) by mouth daily. 04/25/12 04/25/13 Yes Ok Anis, NP  cholecalciferol (VITAMIN D) 1000 UNITS tablet Take 1,000 Units by mouth daily.   Yes Historical Provider, MD  clopidogrel (PLAVIX) 75 MG tablet  Take 1 tablet (75 mg total) by mouth daily with breakfast. 04/25/12 04/25/13 Yes Ok Anis, NP  isosorbide mononitrate (IMDUR) 60 MG 24 hr tablet TAKE ONE AND ONE HALF TABLET DAILY 05/16/12  Yes Ok Anis, NP  nitroGLYCERIN (NITROSTAT) 0.4 MG SL tablet Place 1 tablet (0.4 mg total) under the tongue every 5 (five) minutes x 3 doses as needed for chest pain. 04/25/12 04/25/13 Yes Ok Anis, NP  Omega-3 Fatty Acids (FISH OIL) 1200 MG CAPS Take 3 capsules by mouth daily.   Yes Historical Provider, MD  simvastatin (ZOCOR) 10 MG tablet Take 10 mg by mouth at bedtime.   Yes Historical Provider, MD  lisinopril-hydrochlorothiazide (PRINZIDE,ZESTORETIC) 20-12.5 MG per tablet Take 1 tablet by mouth daily.    Historical Provider, MD    Review of Systems  2 episodes of exertional chest discomfort as above.  Otherwise all other systems reviewed and are otherwise negative except as noted above.  Physical Exam  Blood pressure 126/86, pulse 60, height 5\' 9"  (1.753 m), weight 245 lb (111.131 kg).  General: Pleasant, NAD Psych: Normal affect. Neuro: Alert and oriented X 3. Moves all extremities spontaneously. HEENT: Normal  Neck: Supple without bruits or JVD. Lungs:  Resp regular and unlabored, CTA. Heart: RRR no s3, s4, or murmurs. Abdomen: Soft, non-tender, non-distended, BS + x 4.  Extremities: No clubbing, cyanosis  or edema. DP/PT/Radials 2+ and equal bilaterally.  Right wrist catheter site is without bleeding, bruit, or hematoma.  Assessment & Plan  1.  Stable angina/CAD: He is status post stenting of the right coronary artery.  FFR in the LAD was reassuring.  That said, he has had 2 episodes of exertional chest discomfort since his discharge.  We have discussed this at length and at this time patient would prefer to continue pursuit of medical therapy rather than LAD intervention.  I have increased his Imdur to 90 mg daily and will have her followup with Dr. Excell Seltzer within the next  month or sooner if necessary, to keep close tabs on his progress.  If he continues to have exertional chest pain, he will likely require LAD intervention.  2.  Hypertension: Stable.  3.  Hyperlipidemia: Continue low-dose statin therapy.  4.  Obesity: Patient is currently walking regularly.  5.  Disposition: Follow up with Dr. Excell Seltzer in one month or sooner.  Nicolasa Ducking, NP 05/16/2012, 1:26 PM

## 2012-05-16 NOTE — Patient Instructions (Addendum)
Your physician recommends that you schedule a follow-up appointment in: 4 WEEKS WITH DR Excell Seltzer  INCREASE ISOSORBIDE TO ONE AND ONE HALF TABLETS ONCE DAILY  RESTART LISINOPRIL/HCTZ ONCE DAILY

## 2012-06-01 ENCOUNTER — Encounter (HOSPITAL_COMMUNITY)
Admission: RE | Admit: 2012-06-01 | Discharge: 2012-06-01 | Disposition: A | Discharge status: Home / Self Care | Payer: BC Managed Care – PPO | Source: Ambulatory Visit | Attending: Cardiovascular Disease | Admitting: Cardiovascular Disease

## 2012-06-01 ENCOUNTER — Encounter (HOSPITAL_COMMUNITY): Payer: Self-pay

## 2012-06-01 DIAGNOSIS — I2510 Atherosclerotic heart disease of native coronary artery without angina pectoris: Secondary | ICD-10-CM | POA: Insufficient documentation

## 2012-06-01 DIAGNOSIS — Z5189 Encounter for other specified aftercare: Secondary | ICD-10-CM | POA: Insufficient documentation

## 2012-06-01 DIAGNOSIS — E785 Hyperlipidemia, unspecified: Secondary | ICD-10-CM | POA: Insufficient documentation

## 2012-06-01 DIAGNOSIS — Z9889 Other specified postprocedural states: Secondary | ICD-10-CM | POA: Insufficient documentation

## 2012-06-01 DIAGNOSIS — I10 Essential (primary) hypertension: Secondary | ICD-10-CM | POA: Insufficient documentation

## 2012-06-01 NOTE — Progress Notes (Signed)
Patient was referred to [place] by Dr. Andee Lineman due to stent placement. Dr. Tonny Bollman is his primary cardiologist. During orientation advised patient on arrival and appointment times what to wear, what to do before, during and after exercise. Reviewed attendance and class policy. Talked about inclement weather and class consultation policy. Pt is scheduled to start Cardiac Rehab on 06/05/12 at 9:30. Pt was advised to come to class 5 minutes before class starts. He was also given instructions on meeting with the dietician and attending the Family Structure classes. Pt is eager to get started.

## 2012-06-01 NOTE — Patient Instructions (Signed)
Pt has finished orientation and is scheduled to start CR on 06/05/12 at 9:30 am. Pt has been instructed to arrive to class 15 minutes early for scheduled class. Pt has been instructed to wear comfortable clothing and shoes with rubber soles. Pt has been told to take their medications 1 hour prior to coming to class.  If the patient is not going to attend class, he/she has been instructed to call.

## 2012-06-05 ENCOUNTER — Encounter (HOSPITAL_COMMUNITY)
Admission: RE | Admit: 2012-06-05 | Discharge: 2012-06-05 | Disposition: A | Discharge status: Home / Self Care | Payer: BC Managed Care – PPO | Source: Ambulatory Visit | Attending: Cardiovascular Disease | Admitting: Cardiovascular Disease

## 2012-06-07 ENCOUNTER — Encounter (HOSPITAL_COMMUNITY)
Admission: RE | Admit: 2012-06-07 | Discharge: 2012-06-07 | Disposition: A | Discharge status: Home / Self Care | Payer: BC Managed Care – PPO | Source: Ambulatory Visit | Attending: Cardiovascular Disease | Admitting: Cardiovascular Disease

## 2012-06-09 ENCOUNTER — Encounter (HOSPITAL_COMMUNITY)
Admission: RE | Admit: 2012-06-09 | Discharge: 2012-06-09 | Disposition: A | Discharge status: Home / Self Care | Payer: BC Managed Care – PPO | Source: Ambulatory Visit | Attending: Cardiovascular Disease | Admitting: Cardiovascular Disease

## 2012-06-12 ENCOUNTER — Encounter (HOSPITAL_COMMUNITY)
Admission: RE | Admit: 2012-06-12 | Discharge: 2012-06-12 | Disposition: A | Discharge status: Home / Self Care | Payer: BC Managed Care – PPO | Source: Ambulatory Visit | Attending: Cardiovascular Disease | Admitting: Cardiovascular Disease

## 2012-06-14 ENCOUNTER — Encounter (HOSPITAL_COMMUNITY)
Admission: RE | Admit: 2012-06-14 | Discharge: 2012-06-14 | Disposition: A | Discharge status: Home / Self Care | Payer: BC Managed Care – PPO | Source: Ambulatory Visit | Attending: Cardiovascular Disease | Admitting: Cardiovascular Disease

## 2012-06-16 ENCOUNTER — Encounter (HOSPITAL_COMMUNITY)
Admission: RE | Admit: 2012-06-16 | Discharge: 2012-06-16 | Disposition: A | Discharge status: Home / Self Care | Payer: BC Managed Care – PPO | Source: Ambulatory Visit | Attending: Cardiovascular Disease | Admitting: Cardiovascular Disease

## 2012-06-19 ENCOUNTER — Encounter (HOSPITAL_COMMUNITY)
Admission: RE | Admit: 2012-06-19 | Discharge: 2012-06-19 | Disposition: A | Discharge status: Home / Self Care | Payer: BC Managed Care – PPO | Source: Ambulatory Visit | Attending: Cardiovascular Disease | Admitting: Cardiovascular Disease

## 2012-06-21 ENCOUNTER — Encounter (HOSPITAL_COMMUNITY)
Admission: RE | Admit: 2012-06-21 | Discharge: 2012-06-21 | Disposition: A | Discharge status: Home / Self Care | Payer: BC Managed Care – PPO | Source: Ambulatory Visit | Attending: Cardiovascular Disease | Admitting: Cardiovascular Disease

## 2012-06-23 ENCOUNTER — Encounter (HOSPITAL_COMMUNITY)
Admission: RE | Admit: 2012-06-23 | Discharge: 2012-06-23 | Disposition: A | Discharge status: Home / Self Care | Payer: BC Managed Care – PPO | Source: Ambulatory Visit | Attending: Cardiovascular Disease | Admitting: Cardiovascular Disease

## 2012-06-23 DIAGNOSIS — Z5189 Encounter for other specified aftercare: Secondary | ICD-10-CM | POA: Insufficient documentation

## 2012-06-23 DIAGNOSIS — I2510 Atherosclerotic heart disease of native coronary artery without angina pectoris: Secondary | ICD-10-CM | POA: Insufficient documentation

## 2012-06-23 DIAGNOSIS — E785 Hyperlipidemia, unspecified: Secondary | ICD-10-CM | POA: Insufficient documentation

## 2012-06-23 DIAGNOSIS — I10 Essential (primary) hypertension: Secondary | ICD-10-CM | POA: Insufficient documentation

## 2012-06-23 DIAGNOSIS — Z9889 Other specified postprocedural states: Secondary | ICD-10-CM | POA: Insufficient documentation

## 2012-06-26 ENCOUNTER — Encounter (HOSPITAL_COMMUNITY)
Admission: RE | Admit: 2012-06-26 | Discharge: 2012-06-26 | Disposition: A | Discharge status: Home / Self Care | Payer: BC Managed Care – PPO | Source: Ambulatory Visit | Attending: Cardiovascular Disease | Admitting: Cardiovascular Disease

## 2012-06-26 MED ORDER — MEPERIDINE HCL 100 MG/ML IJ SOLN
INTRAMUSCULAR | Status: AC
  Filled 2012-06-26: qty 2

## 2012-06-26 MED ORDER — MIDAZOLAM HCL 5 MG/5ML IJ SOLN
INTRAMUSCULAR | Status: AC
  Filled 2012-06-26: qty 10

## 2012-06-28 ENCOUNTER — Encounter (HOSPITAL_COMMUNITY)
Admission: RE | Admit: 2012-06-28 | Discharge: 2012-06-28 | Disposition: A | Discharge status: Home / Self Care | Payer: BC Managed Care – PPO | Source: Ambulatory Visit | Attending: Cardiovascular Disease | Admitting: Cardiovascular Disease

## 2012-06-30 ENCOUNTER — Encounter (HOSPITAL_COMMUNITY)
Admission: RE | Admit: 2012-06-30 | Discharge: 2012-06-30 | Disposition: A | Discharge status: Home / Self Care | Payer: BC Managed Care – PPO | Source: Ambulatory Visit | Attending: Cardiovascular Disease | Admitting: Cardiovascular Disease

## 2012-07-03 ENCOUNTER — Encounter (HOSPITAL_COMMUNITY)
Admission: RE | Admit: 2012-07-03 | Discharge: 2012-07-03 | Disposition: A | Discharge status: Home / Self Care | Payer: BC Managed Care – PPO | Source: Ambulatory Visit | Attending: Cardiovascular Disease | Admitting: Cardiovascular Disease

## 2012-07-05 ENCOUNTER — Encounter (HOSPITAL_COMMUNITY)
Admission: RE | Admit: 2012-07-05 | Discharge: 2012-07-05 | Disposition: A | Discharge status: Home / Self Care | Payer: BC Managed Care – PPO | Source: Ambulatory Visit | Attending: Cardiovascular Disease | Admitting: Cardiovascular Disease

## 2012-07-07 ENCOUNTER — Encounter (HOSPITAL_COMMUNITY)
Admission: RE | Admit: 2012-07-07 | Discharge: 2012-07-07 | Disposition: A | Discharge status: Home / Self Care | Payer: BC Managed Care – PPO | Source: Ambulatory Visit | Attending: Cardiovascular Disease | Admitting: Cardiovascular Disease

## 2012-07-10 ENCOUNTER — Encounter (HOSPITAL_COMMUNITY)
Admission: RE | Admit: 2012-07-10 | Discharge: 2012-07-10 | Disposition: A | Discharge status: Home / Self Care | Payer: BC Managed Care – PPO | Source: Ambulatory Visit | Attending: Cardiovascular Disease | Admitting: Cardiovascular Disease

## 2012-07-12 ENCOUNTER — Encounter (HOSPITAL_COMMUNITY)
Admission: RE | Admit: 2012-07-12 | Discharge: 2012-07-12 | Disposition: A | Discharge status: Home / Self Care | Payer: BC Managed Care – PPO | Source: Ambulatory Visit | Attending: Cardiovascular Disease | Admitting: Cardiovascular Disease

## 2012-07-14 ENCOUNTER — Encounter (HOSPITAL_COMMUNITY): Payer: BC Managed Care – PPO

## 2012-07-17 ENCOUNTER — Encounter (HOSPITAL_COMMUNITY)
Admission: RE | Admit: 2012-07-17 | Discharge: 2012-07-17 | Disposition: A | Discharge status: Home / Self Care | Payer: BC Managed Care – PPO | Source: Ambulatory Visit | Attending: Cardiovascular Disease | Admitting: Cardiovascular Disease

## 2012-07-18 ENCOUNTER — Encounter: Payer: Self-pay | Admitting: Cardiovascular Disease

## 2012-07-18 ENCOUNTER — Ambulatory Visit (INDEPENDENT_AMBULATORY_CARE_PROVIDER_SITE_OTHER): Payer: BC Managed Care – PPO | Admitting: Cardiovascular Disease

## 2012-07-18 VITALS — BP 123/76 | HR 92 | Ht 69.0 in | Wt 245.1 lb

## 2012-07-18 DIAGNOSIS — I10 Essential (primary) hypertension: Secondary | ICD-10-CM

## 2012-07-18 DIAGNOSIS — I2510 Atherosclerotic heart disease of native coronary artery without angina pectoris: Secondary | ICD-10-CM

## 2012-07-18 DIAGNOSIS — E669 Obesity, unspecified: Secondary | ICD-10-CM

## 2012-07-18 MED ORDER — SIMVASTATIN 10 MG PO TABS: 10.0000 mg | ORAL_TABLET | Freq: Every day | ORAL | Status: DC

## 2012-07-18 MED ORDER — CLOPIDOGREL BISULFATE 75 MG PO TABS: 75.0000 mg | ORAL_TABLET | Freq: Every day | ORAL | Status: DC

## 2012-07-18 MED ORDER — METOPROLOL TARTRATE 25 MG PO TABS: 25.0000 mg | ORAL_TABLET | Freq: Two times a day (BID) | ORAL | Status: DC

## 2012-07-18 NOTE — Assessment & Plan Note (Addendum)
Counseled regarding the need for continued lifestyle and dietary modification

## 2012-07-18 NOTE — Assessment & Plan Note (Signed)
Blood pressure is controlled. Beta blocker to be added.

## 2012-07-18 NOTE — Progress Notes (Signed)
HPI:  61 year old gentleman presenting for followup evaluation. The patient has coronary artery disease and presented with unstable angina earlier this year. He underwent stenting of the right coronary artery and pressure wire analysis of the LAD. He's been treated with medical therapy since his interventional procedure and he presents today for followup.  The patient continues to have left-sided chest pressure with exertion. He has taken 3 nitroglycerin since his last visit here. His symptoms have resolved with the use of one sublingual nitroglycerin on each occasion. His symptoms are consistently related to exertion. He was [place] the [place] this [place] weekend and had some chest pain with walking. His other episodes have occurred with yard work. He has mild associated dyspnea. He's had no resting chest pain or pressure. His symptoms have not progressed over the last month. He denies orthopnea, PND, palpitations, lightheadedness, or syncope. He reports compliance with his medications.  Outpatient Encounter Prescriptions as of 07/18/2012  Medication Sig Dispense Refill  . amLODipine (NORVASC) 5 MG tablet Take 1 tablet (5 mg total) by mouth daily.  30 tablet  6  . aspirin EC 81 MG EC tablet Take 1 tablet (81 mg total) by mouth daily.      . cholecalciferol (VITAMIN D) 1000 UNITS tablet Take 1,000 Units by mouth daily.      . isosorbide mononitrate (IMDUR) 60 MG 24 hr tablet TAKE ONE AND ONE HALF TABLET DAILY  30 tablet  12  . lisinopril (PRINIVIL,ZESTRIL) 20 MG tablet Take 20 mg by mouth daily.      . nitroGLYCERIN (NITROSTAT) 0.4 MG SL tablet Place 1 tablet (0.4 mg total) under the tongue every 5 (five) minutes x 3 doses as needed for chest pain.  25 tablet  3  . Omega-3 Fatty Acids (FISH OIL) 1200 MG CAPS Take 3 capsules by mouth daily.      . clopidogrel (PLAVIX) 75 MG tablet Take 1 tablet (75 mg total) by mouth daily with breakfast.  90 tablet  3  . metoprolol tartrate (LOPRESSOR) 25 MG tablet Take  1 tablet (25 mg total) by mouth 2 (two) times daily.  60 tablet  1  . simvastatin (ZOCOR) 10 MG tablet Take 1 tablet (10 mg total) by mouth at bedtime.  90 tablet  3  . DISCONTD: clopidogrel (PLAVIX) 75 MG tablet Take 1 tablet (75 mg total) by mouth daily with breakfast.  30 tablet  6  . DISCONTD: clopidogrel (PLAVIX) 75 MG tablet Take 1 tablet (75 mg total) by mouth daily with breakfast.  30 tablet  1  . DISCONTD: lisinopril-hydrochlorothiazide (PRINZIDE,ZESTORETIC) 20-12.5 MG per tablet Take 1 tablet by mouth daily.      Marland Kitchen DISCONTD: simvastatin (ZOCOR) 10 MG tablet Take 10 mg by mouth at bedtime.      Marland Kitchen DISCONTD: simvastatin (ZOCOR) 10 MG tablet Take 1 tablet (10 mg total) by mouth at bedtime.  30 tablet  1    No Known Allergies  Past Medical History  Diagnosis Date  . CAD (coronary artery disease)     a. 03/2012 Cath: LAD 80, RCA 80, nl EF;  b. PCI of RCA w 3.0x52mm Promus DES, FFR of LAD was nl @ 0.85 ->Med Rx.  Marland Kitchen Hypertension   . Arthritis   . Hyperlipidemia   . Obesity    ROS: Negative except as per HPI  BP 123/76  Pulse 92  Ht 5\' 9"  (1.753 m)  Wt 111.186 kg (245 lb 1.9 oz)  BMI 36.20 kg/m2  PHYSICAL  EXAM: Pt is alert and oriented, pleasant obese male in NAD HEENT: normal Neck: JVP - normal, carotids 2+= without bruits Lungs: CTA bilaterally CV: RRR without murmur or gallop Abd: soft, NT, Positive BS, no hepatomegaly Ext: no C/C/E, distal pulses intact and equal Skin: warm/dry no rash  ASSESSMENT AND PLAN:

## 2012-07-18 NOTE — Patient Instructions (Signed)
Your physician recommends that you schedule a follow-up appointment in: 1 MONTH with Dr Excell Seltzer  Your physician has recommended you make the following change in your medication: START Metoprolol Tartrate 25mg  take one by mouth twice a day

## 2012-07-18 NOTE — Assessment & Plan Note (Signed)
The patient continues to have anginal symptoms with exertion. He has CCS class 2-3 angina. He's on 2 antianginal drugs with the use of isosorbide and amlodipine. I think we should start him on metoprolol 25 mg twice daily. I reviewed his cardiac catheterization films and he had a good result from stenting of his right coronary artery. His LAD was evaluated with a pressure wire and was nonischemic. However, he does have small vessel disease including a diagonal branch and the right PDA branch. I think he could have residual angina from these lesions. I think we should give him a trial of an increase in his antianginal program and see him back in 4 weeks. If he continues to have limitation from anginal symptoms, I would favor proceeding with repeat cardiac catheterization and possible PCI. I reviewed this in detail with the patient and his wife and they agree with this plan. He will contact us if his symptoms progress in the interim.

## 2012-07-19 ENCOUNTER — Encounter (HOSPITAL_COMMUNITY)
Admission: RE | Admit: 2012-07-19 | Discharge: 2012-07-19 | Disposition: A | Discharge status: Home / Self Care | Payer: BC Managed Care – PPO | Source: Ambulatory Visit | Attending: Cardiovascular Disease | Admitting: Cardiovascular Disease

## 2012-07-21 ENCOUNTER — Encounter (HOSPITAL_COMMUNITY)
Admission: RE | Admit: 2012-07-21 | Discharge: 2012-07-21 | Disposition: A | Discharge status: Home / Self Care | Payer: BC Managed Care – PPO | Source: Ambulatory Visit | Attending: Cardiovascular Disease | Admitting: Cardiovascular Disease

## 2012-07-24 ENCOUNTER — Encounter (HOSPITAL_COMMUNITY): Payer: BC Managed Care – PPO

## 2012-07-26 ENCOUNTER — Encounter (HOSPITAL_COMMUNITY)
Admission: RE | Admit: 2012-07-26 | Discharge: 2012-07-26 | Disposition: A | Discharge status: Home / Self Care | Payer: BC Managed Care – PPO | Source: Ambulatory Visit | Attending: Cardiovascular Disease | Admitting: Cardiovascular Disease

## 2012-07-26 DIAGNOSIS — I2510 Atherosclerotic heart disease of native coronary artery without angina pectoris: Secondary | ICD-10-CM | POA: Insufficient documentation

## 2012-07-26 DIAGNOSIS — Z9889 Other specified postprocedural states: Secondary | ICD-10-CM | POA: Insufficient documentation

## 2012-07-26 DIAGNOSIS — I10 Essential (primary) hypertension: Secondary | ICD-10-CM | POA: Insufficient documentation

## 2012-07-26 DIAGNOSIS — E785 Hyperlipidemia, unspecified: Secondary | ICD-10-CM | POA: Insufficient documentation

## 2012-07-26 DIAGNOSIS — Z5189 Encounter for other specified aftercare: Secondary | ICD-10-CM | POA: Insufficient documentation

## 2012-07-28 ENCOUNTER — Encounter (HOSPITAL_COMMUNITY)
Admission: RE | Admit: 2012-07-28 | Discharge: 2012-07-28 | Disposition: A | Discharge status: Home / Self Care | Payer: BC Managed Care – PPO | Source: Ambulatory Visit | Attending: Cardiovascular Disease | Admitting: Cardiovascular Disease

## 2012-07-31 ENCOUNTER — Encounter (HOSPITAL_COMMUNITY)
Admission: RE | Admit: 2012-07-31 | Discharge: 2012-07-31 | Disposition: A | Discharge status: Home / Self Care | Payer: BC Managed Care – PPO | Source: Ambulatory Visit | Attending: Cardiovascular Disease | Admitting: Cardiovascular Disease

## 2012-08-02 ENCOUNTER — Encounter (HOSPITAL_COMMUNITY)
Admission: RE | Admit: 2012-08-02 | Discharge: 2012-08-02 | Disposition: A | Discharge status: Home / Self Care | Payer: BC Managed Care – PPO | Source: Ambulatory Visit | Attending: Cardiovascular Disease | Admitting: Cardiovascular Disease

## 2012-08-04 ENCOUNTER — Encounter (HOSPITAL_COMMUNITY)
Admission: RE | Admit: 2012-08-04 | Discharge: 2012-08-04 | Disposition: A | Discharge status: Home / Self Care | Payer: BC Managed Care – PPO | Source: Ambulatory Visit | Attending: Cardiovascular Disease | Admitting: Cardiovascular Disease

## 2012-08-07 ENCOUNTER — Encounter (HOSPITAL_COMMUNITY)
Admission: RE | Admit: 2012-08-07 | Discharge: 2012-08-07 | Disposition: A | Discharge status: Home / Self Care | Payer: BC Managed Care – PPO | Source: Ambulatory Visit | Attending: Cardiovascular Disease | Admitting: Cardiovascular Disease

## 2012-08-09 ENCOUNTER — Encounter (HOSPITAL_COMMUNITY)
Admission: RE | Admit: 2012-08-09 | Discharge: 2012-08-09 | Disposition: A | Discharge status: Home / Self Care | Payer: BC Managed Care – PPO | Source: Ambulatory Visit | Attending: Cardiovascular Disease | Admitting: Cardiovascular Disease

## 2012-08-11 ENCOUNTER — Encounter (HOSPITAL_COMMUNITY)
Admission: RE | Admit: 2012-08-11 | Discharge: 2012-08-11 | Disposition: A | Discharge status: Home / Self Care | Payer: BC Managed Care – PPO | Source: Ambulatory Visit | Attending: Cardiovascular Disease | Admitting: Cardiovascular Disease

## 2012-08-14 ENCOUNTER — Encounter (HOSPITAL_COMMUNITY)
Admission: RE | Admit: 2012-08-14 | Discharge: 2012-08-14 | Disposition: A | Discharge status: Home / Self Care | Payer: BC Managed Care – PPO | Source: Ambulatory Visit | Attending: Cardiovascular Disease | Admitting: Cardiovascular Disease

## 2012-08-15 ENCOUNTER — Ambulatory Visit (INDEPENDENT_AMBULATORY_CARE_PROVIDER_SITE_OTHER): Payer: BC Managed Care – PPO | Admitting: Cardiovascular Disease

## 2012-08-15 ENCOUNTER — Encounter: Payer: Self-pay | Admitting: Cardiovascular Disease

## 2012-08-15 VITALS — BP 110/78 | HR 63 | Ht 69.0 in | Wt 246.1 lb

## 2012-08-15 DIAGNOSIS — I2510 Atherosclerotic heart disease of native coronary artery without angina pectoris: Secondary | ICD-10-CM

## 2012-08-15 NOTE — Patient Instructions (Signed)
Your physician wants you to follow-up in: 6 MONTHS with Dr Cooper.  You will receive a reminder letter in the mail two months in advance. If you don't receive a letter, please call our office to schedule the follow-up appointment.  Your physician recommends that you continue on your current medications as directed. Please refer to the Current Medication list given to you today.  

## 2012-08-15 NOTE — Progress Notes (Signed)
HPI:  61 year old gentleman present for followup evaluation. The patient has coronary artery disease and presented with unstable angina several months ago. He underwent PCI to right coronary artery and pressure wire analysis of the LAD. His pressure wire was negative, but he has continued to have chest pressure with exertion. When I saw him one month ago I added a beta blocker to his medical program which are included a long-acting nitrate and calcium channel blocker. He returns today for followup evaluation.  Overall he is doing better. He's had a few episodes of mild chest pain since his last office visit, but he has only required nitroglycerin on one occasion. He walks 3 times a week with cardiac rehabilitation and is having no symptoms with that level of exertion. He had an episode of chest tightness when he took a walk in the cold weather following a meal. Overall his anginal symptoms have improved since his last office visit. He denies dyspnea, edema, or palpitations. He's been compliant with his medications.  Outpatient Encounter Prescriptions as of 08/15/2012  Medication Sig Dispense Refill  . amLODipine (NORVASC) 5 MG tablet Take 1 tablet (5 mg total) by mouth daily.  30 tablet  6  . aspirin EC 81 MG EC tablet Take 1 tablet (81 mg total) by mouth daily.      . cholecalciferol (VITAMIN D) 1000 UNITS tablet Take 1,000 Units by mouth daily.      . clopidogrel (PLAVIX) 75 MG tablet Take 1 tablet (75 mg total) by mouth daily with breakfast.  90 tablet  3  . isosorbide mononitrate (IMDUR) 60 MG 24 hr tablet TAKE ONE AND ONE HALF TABLET DAILY  30 tablet  12  . lisinopril (PRINIVIL,ZESTRIL) 20 MG tablet Take 20 mg by mouth daily.      . metoprolol tartrate (LOPRESSOR) 25 MG tablet Take 1 tablet (25 mg total) by mouth 2 (two) times daily.  60 tablet  1  . nitroGLYCERIN (NITROSTAT) 0.4 MG SL tablet Place 1 tablet (0.4 mg total) under the tongue every 5 (five) minutes x 3 doses as needed for chest  pain.  25 tablet  3  . Omega-3 Fatty Acids (FISH OIL) 1200 MG CAPS Take 3 capsules by mouth daily.      . simvastatin (ZOCOR) 10 MG tablet Take 1 tablet (10 mg total) by mouth at bedtime.  90 tablet  3    No Known Allergies  Past Medical History  Diagnosis Date  . CAD (coronary artery disease)     a. 03/2012 Cath: LAD 80, RCA 80, nl EF;  b. PCI of RCA w 3.0x48mm Promus DES, FFR of LAD was nl @ 0.85 ->Med Rx.  Marland Kitchen Hypertension   . Arthritis   . Hyperlipidemia   . Obesity     ROS: Negative except as per HPI  BP 110/78  Pulse 63  Ht 5\' 9"  (1.753 m)  Wt 111.639 kg (246 lb 1.9 oz)  BMI 36.35 kg/m2  PHYSICAL EXAM: Pt is alert and oriented, NAD HEENT: normal Neck: JVP - normal, carotids 2+= without bruits Lungs: CTA bilaterally CV: RRR without murmur or gallop Abd: soft, NT, Positive BS, no hepatomegaly Ext: no C/C/E, distal pulses intact and equal Skin: warm/dry no rash  ASSESSMENT AND PLAN: 1. Coronary artery disease, native vessel. The patient has CCS class II angina. He is on a maximal medical program which includes isosorbide, amlodipine, and metoprolol. His blood pressure is under ideal control. He's on a statin drug and dual  antiplatelet therapy with aspirin and Plavix. We will continue his same medical program and I would like to see him back in 6 months for followup evaluation.  2. Hypertension. Blood pressure well controlled  3. Hyperlipidemia. The patient is on low dose simvastatin because of concomitant calcium channel blocker use. He has a physical exam next month with his primary care physician and he tells me that all of his lab work will be checked at that time.  For followup I would like to see him back in 6 months.

## 2012-08-16 ENCOUNTER — Encounter (HOSPITAL_COMMUNITY)
Admission: RE | Admit: 2012-08-16 | Discharge: 2012-08-16 | Disposition: A | Discharge status: Home / Self Care | Payer: BC Managed Care – PPO | Source: Ambulatory Visit | Attending: Cardiovascular Disease | Admitting: Cardiovascular Disease

## 2012-08-18 ENCOUNTER — Encounter (HOSPITAL_COMMUNITY)
Admission: RE | Admit: 2012-08-18 | Discharge: 2012-08-18 | Disposition: A | Discharge status: Home / Self Care | Payer: BC Managed Care – PPO | Source: Ambulatory Visit | Attending: Cardiovascular Disease | Admitting: Cardiovascular Disease

## 2012-08-21 ENCOUNTER — Encounter (HOSPITAL_COMMUNITY)
Admission: RE | Admit: 2012-08-21 | Discharge: 2012-08-21 | Disposition: A | Discharge status: Home / Self Care | Payer: BC Managed Care – PPO | Source: Ambulatory Visit | Attending: Cardiovascular Disease | Admitting: Cardiovascular Disease

## 2012-08-23 ENCOUNTER — Encounter (HOSPITAL_COMMUNITY)
Admission: RE | Admit: 2012-08-23 | Discharge: 2012-08-23 | Disposition: A | Discharge status: Home / Self Care | Payer: BC Managed Care – PPO | Source: Ambulatory Visit | Attending: Cardiovascular Disease | Admitting: Cardiovascular Disease

## 2012-08-23 DIAGNOSIS — E785 Hyperlipidemia, unspecified: Secondary | ICD-10-CM | POA: Insufficient documentation

## 2012-08-23 DIAGNOSIS — I2510 Atherosclerotic heart disease of native coronary artery without angina pectoris: Secondary | ICD-10-CM | POA: Insufficient documentation

## 2012-08-23 DIAGNOSIS — I10 Essential (primary) hypertension: Secondary | ICD-10-CM | POA: Insufficient documentation

## 2012-08-23 DIAGNOSIS — Z9889 Other specified postprocedural states: Secondary | ICD-10-CM | POA: Insufficient documentation

## 2012-08-23 DIAGNOSIS — Z5189 Encounter for other specified aftercare: Secondary | ICD-10-CM | POA: Insufficient documentation

## 2012-08-25 ENCOUNTER — Encounter (HOSPITAL_COMMUNITY)
Admission: RE | Admit: 2012-08-25 | Discharge: 2012-08-25 | Disposition: A | Discharge status: Home / Self Care | Payer: BC Managed Care – PPO | Source: Ambulatory Visit | Attending: Cardiovascular Disease | Admitting: Cardiovascular Disease

## 2012-08-28 ENCOUNTER — Other Ambulatory Visit: Payer: Self-pay

## 2012-08-28 ENCOUNTER — Emergency Department (HOSPITAL_COMMUNITY)
Admission: EM | Admit: 2012-08-28 | Discharge: 2012-08-28 | Disposition: A | Discharge status: Home / Self Care | Payer: BC Managed Care – PPO | Attending: Emergency Medicine | Admitting: Emergency Medicine

## 2012-08-28 ENCOUNTER — Encounter (HOSPITAL_COMMUNITY)
Admission: RE | Admit: 2012-08-28 | Discharge: 2012-08-28 | Disposition: A | Discharge status: Home / Self Care | Payer: BC Managed Care – PPO | Source: Ambulatory Visit | Attending: Cardiovascular Disease | Admitting: Cardiovascular Disease

## 2012-08-28 ENCOUNTER — Encounter (HOSPITAL_COMMUNITY): Payer: Self-pay | Admitting: Emergency Medicine

## 2012-08-28 ENCOUNTER — Emergency Department (HOSPITAL_COMMUNITY): Payer: BC Managed Care – PPO

## 2012-08-28 DIAGNOSIS — R079 Chest pain, unspecified: Secondary | ICD-10-CM

## 2012-08-28 DIAGNOSIS — Z7982 Long term (current) use of aspirin: Secondary | ICD-10-CM | POA: Insufficient documentation

## 2012-08-28 DIAGNOSIS — R61 Generalized hyperhidrosis: Secondary | ICD-10-CM | POA: Insufficient documentation

## 2012-08-28 DIAGNOSIS — M129 Arthropathy, unspecified: Secondary | ICD-10-CM | POA: Insufficient documentation

## 2012-08-28 DIAGNOSIS — I2510 Atherosclerotic heart disease of native coronary artery without angina pectoris: Secondary | ICD-10-CM | POA: Insufficient documentation

## 2012-08-28 DIAGNOSIS — E785 Hyperlipidemia, unspecified: Secondary | ICD-10-CM | POA: Insufficient documentation

## 2012-08-28 DIAGNOSIS — I10 Essential (primary) hypertension: Secondary | ICD-10-CM | POA: Insufficient documentation

## 2012-08-28 DIAGNOSIS — Z79899 Other long term (current) drug therapy: Secondary | ICD-10-CM | POA: Insufficient documentation

## 2012-08-28 LAB — BASIC METABOLIC PANEL
CO2: 24 mEq/L (ref 19–32)
Calcium: 9 mg/dL (ref 8.4–10.5)
Creatinine, Ser: 0.67 mg/dL (ref 0.50–1.35)
GFR calc Af Amer: >90 mL/min (ref >90)
GFR calc non Af Amer: >90 mL/min (ref >90)
Sodium: 135 mEq/L (ref 135–145)

## 2012-08-28 LAB — CBC WITH DIFFERENTIAL/PLATELET
Basophils Absolute: 0.1 10*3/uL (ref 0.0–0.1)
Basophils Relative: 1 % (ref 0–1)
Eosinophils Absolute: 0.2 10*3/uL (ref 0.0–0.7)
Eosinophils Relative: 2 % (ref 0–5)
HCT: 40.2 % (ref 39.0–52.0)
MCH: 29.4 pg (ref 26.0–34.0)
MCHC: 34.8 g/dL (ref 30.0–36.0)
MCV: 84.3 fL (ref 78.0–100.0)
Monocytes Absolute: 0.8 10*3/uL (ref 0.1–1.0)
Platelets: 291 10*3/uL (ref 150–400)
RDW: 14.3 % (ref 11.5–15.5)

## 2012-08-28 NOTE — ED Notes (Signed)
Pt c/o intermittant cp/sob/weakness and sweating x 2 weeks with activity.pain radiates into L jaw and L arm at times.  Pt came for cardiac rehab today and was exercising and had near syncope episode. Cardiac rehab nurse states pt became very diaphoretic, c/o cp and sob and almost passed out. pta pt pain radiated into L jaw. Pt is alert/oriented at this time. Slightly clammy at this time. Denies any pain or sob at this time.

## 2012-08-28 NOTE — ED Provider Notes (Addendum)
History   This chart was scribed for Shelda Jakes, MD by Charolett Bumpers . The patient was seen in room APA08/APA08. Patient's care was started at 1012.    CSN: [id number]  Arrival date & time 08/28/12  1001   First MD Initiated Contact with Patient 08/28/12 1012      Chief Complaint  Patient presents with  . Chest Pain    (Consider location/radiation/quality/duration/timing/severity/associated sxs/prior treatment) HPI Comments: Kyle Lambert is a 61 y.o. male who presents to the Emergency Department complaining of one episode of left-sided chest pain lasting 5 minutes PTA that has since resolved. He states the chest pain was sharp. He reports associated diaphoresis. He states that he was cardiac rehab on an exercise machine when he started having the chest pain. He reports a h/o similar chest pain with exertion, with his last episode prior to today being yesterday. He states he gets the chest pain with lifting and exertion. He states that he had a stent placed in 05/2012. He was seen by his cardiologist last week who is aware of the chest pain per pt. He states that he was started on Plavix.   PCP: Dr. Leandrew Koyanagi in [place] Cardiologist: Dr. Adriana Simas  Patient is a 61 y.o. male presenting with chest pain. The history is provided by the patient. No language interpreter was used.  Chest Pain The chest pain began less than 1 hour ago. The chest pain is resolved. The pain is associated with exertion. The quality of the pain is described as sharp. The pain does not radiate. Pertinent negatives for primary symptoms include no fever, no shortness of breath, no cough and no abdominal pain.  Associated symptoms include diaphoresis.     Past Medical History  Diagnosis Date  . CAD (coronary artery disease)     a. 03/2012 Cath: LAD 80, RCA 80, nl EF;  b. PCI of RCA w 3.0x70mm Promus DES, FFR of LAD was nl @ 0.85 ->Med Rx.  Marland Kitchen Hypertension   . Arthritis   . Hyperlipidemia   . Obesity      Past Surgical History  Procedure Date  . Hernia repair   . Lithotripsy   . Coronary stent placement 04/24/12    History reviewed. No pertinent family history.  History  Substance Use Topics  . Smoking status: Never Smoker   . Smokeless tobacco: Never Used  . Alcohol Use: No     " quit along time ago"      Review of Systems  Constitutional: Positive for diaphoresis. Negative for fever and chills.  Respiratory: Negative for cough and shortness of breath.   Cardiovascular: Positive for chest pain.  Gastrointestinal: Negative for abdominal pain.  Genitourinary: Negative for dysuria.  Skin: Negative for rash.  All other systems reviewed and are negative.    Allergies  Review of patient's allergies indicates no known allergies.  Home Medications   Current Outpatient Rx  Name Route Sig Dispense Refill  . AMLODIPINE BESYLATE 5 MG PO TABS Oral Take 1 tablet (5 mg total) by mouth daily. 30 tablet 6  . ASPIRIN 81 MG PO TBEC Oral Take 1 tablet (81 mg total) by mouth daily.    Marland Kitchen VITAMIN D 1000 UNITS PO TABS Oral Take 1,000 Units by mouth daily.    Marland Kitchen CLOPIDOGREL BISULFATE 75 MG PO TABS Oral Take 1 tablet (75 mg total) by mouth daily with breakfast. 90 tablet 3  . ISOSORBIDE MONONITRATE ER 60 MG PO TB24  TAKE ONE  AND ONE HALF TABLET DAILY 30 tablet 12  . LISINOPRIL-HYDROCHLOROTHIAZIDE 20-12.5 MG PO TABS Oral Take 1 tablet by mouth Daily.    Marland Kitchen METOPROLOL TARTRATE 25 MG PO TABS Oral Take 1 tablet (25 mg total) by mouth 2 (two) times daily. 60 tablet 1  . FISH OIL 1200 MG PO CAPS Oral Take 3 capsules by mouth daily.    Marland Kitchen SIMVASTATIN 10 MG PO TABS Oral Take 1 tablet (10 mg total) by mouth at bedtime. 90 tablet 3  . NITROGLYCERIN 0.4 MG SL SUBL Sublingual Place 1 tablet (0.4 mg total) under the tongue every 5 (five) minutes x 3 doses as needed for chest pain. 25 tablet 3    BP 149/89  Pulse 76  Temp 98.7 F (37.1 C) (Oral)  Resp 20  Ht 5\' 9"  (1.753 m)  Wt 246 lb (111.585 kg)   BMI 36.33 kg/m2  SpO2 96%  Physical Exam  Nursing note and vitals reviewed. Constitutional: He is oriented to person, place, and time. He appears well-developed and well-nourished. No distress.  HENT:  Head: Normocephalic and atraumatic.  Right Ear: External ear normal.  Left Ear: External ear normal.  Nose: Nose normal.  Mouth/Throat: No oropharyngeal exudate.       Mucous membranes moist.   Eyes: EOM are normal. Pupils are equal, round, and reactive to light.  Neck: Normal range of motion. Neck supple. No tracheal deviation present.  Cardiovascular: Normal rate, regular rhythm and normal heart sounds.   No murmur heard. Pulmonary/Chest: Effort normal and breath sounds normal. No respiratory distress. He has no wheezes.  Abdominal: Soft. Bowel sounds are normal. He exhibits no distension. There is no tenderness.  Musculoskeletal: Normal range of motion. He exhibits no edema.       No lower extremity edema. Moves all extremities.   Neurological: He is alert and oriented to person, place, and time. No cranial nerve deficit.       Cranial nerves intact.   Skin: Skin is warm and dry.  Psychiatric: He has a normal mood and affect. His behavior is normal.    ED Course  Procedures (including critical care time)  DIAGNOSTIC STUDIES: Oxygen Saturation is 96% on room air, adequate by my interpretation.    COORDINATION OF CARE:  10:52-Discussed planned course of treatment with the patient including chest x-ray, blood work and EKG, who is agreeable at this time.   Results for orders placed during the hospital encounter of 08/28/12  TROPONIN I      Component Value Range   Troponin I <0.30  <0.30 ng/mL  CBC WITH DIFFERENTIAL      Component Value Range   WBC 8.1  4.0 - 10.5 K/uL   RBC 4.77  4.22 - 5.81 MIL/uL   Hemoglobin 14.0  13.0 - 17.0 g/dL   HCT 36.6  44.0 - 34.7 %   MCV 84.3  78.0 - 100.0 fL   MCH 29.4  26.0 - 34.0 pg   MCHC 34.8  30.0 - 36.0 g/dL   RDW 42.5  95.6 - 38.7 %    Platelets 291  150 - 400 K/uL   Neutrophils Relative 60  43 - 77 %   Neutro Abs 4.9  1.7 - 7.7 K/uL   Lymphocytes Relative 27  12 - 46 %   Lymphs Abs 2.2  0.7 - 4.0 K/uL   Monocytes Relative 10  3 - 12 %   Monocytes Absolute 0.8  0.1 - 1.0 K/uL   Eosinophils Relative  2  0 - 5 %   Eosinophils Absolute 0.2  0.0 - 0.7 K/uL   Basophils Relative 1  0 - 1 %   Basophils Absolute 0.1  0.0 - 0.1 K/uL  BASIC METABOLIC PANEL      Component Value Range   Sodium 135  135 - 145 mEq/L   Potassium 3.9  3.5 - 5.1 mEq/L   Chloride 99  96 - 112 mEq/L   CO2 24  19 - 32 mEq/L   Glucose, Bld 117 (*) 70 - 99 mg/dL   BUN 12  6 - 23 mg/dL   Creatinine, Ser 9.60  0.50 - 1.35 mg/dL   Calcium 9.0  8.4 - 45.4 mg/dL   GFR calc non Af Amer >90  >90 mL/min   GFR calc Af Amer >90  >90 mL/min     Dg Chest Portable 1 View  08/28/2012  *RADIOLOGY REPORT*  Clinical Data: Chest pain  PORTABLE CHEST - 1 VIEW  Comparison: May 32,013  Findings: Lungs are clear.  Heart is upper normal in size with normal pulmonary vascularity.  No adenopathy.  No bony lesions.  IMPRESSION: No edema or consolidation.   Original Report Authenticated By: Arvin Collard. WOODRUFF III, M.D.     Date: 08/28/2012  Rate: 74  Rhythm: normal sinus rhythm  QRS Axis: normal  Intervals: normal  ST/T Wave abnormalities: normal  Conduction Disutrbances:right bundle branch block  Narrative Interpretation:   Old EKG Reviewed: unchanged Incomplete right bundle branch block no pain change in EKG compared to 04/25/2012   1. Chest pain       MDM  Workup in emergency part was negative troponins negative chest x-ray is negative. EKG without any acute changes. Patient has had similar pain like this in the past his cardiologist is aware of never lasted more than 5 minutes patient would not come in if it hadn't happened the cardiac rehabilitation where they made him come over. Patient had a cardiac stent placed back at the end of may. Patient will return  for a chest pain lasting for 15 minutes or longer. He will follow his cardiologist this week.   I personally performed the services described in this documentation, which was scribed in my presence. The recorded information has been reviewed and considered.        Shelda Jakes, MD 08/28/12 1124  Shelda Jakes, MD 08/28/12 1124

## 2012-08-30 ENCOUNTER — Encounter (HOSPITAL_COMMUNITY): Payer: BC Managed Care – PPO

## 2012-09-01 ENCOUNTER — Telehealth: Payer: Self-pay | Admitting: Cardiovascular Disease

## 2012-09-01 ENCOUNTER — Encounter (HOSPITAL_COMMUNITY): Payer: BC Managed Care – PPO

## 2012-09-01 NOTE — Telephone Encounter (Signed)
I spoke with Kyle Lambert wife. He was not at home. I was notified by the cardiac rehabilitation but he had chest discomfort with exertion. I've been following him for chronic stable angina after his myocardial infarction. His wife feels that he was just overdoing it and she does not appreciate any significant change in his anginal symptoms. They are going through a stressful time as they're in the middle of moving out of the house. I asked her to make sure that he doesn't overdo it with moving. They will notify us if he develops progressive chest pain.

## 2012-09-01 NOTE — Telephone Encounter (Signed)
Message copied by Tonny Bollman on Fri Sep 01, 2012  6:02 PM ------      Message from: COAD, DIANE B      Created: Thu Aug 31, 2012 10:20 AM      Regarding: Patient near syncopal event       Dr. Excell Seltzer,      I wanted to make you aware of the fact that your patient Kyle Lambert, DOB February 20, 1951, had a near syncopal episode on Monday 08/28/12. After walking on TM patient stumbled to the recumbent stepper, was pale, diaphoretic, and having chest pain and jaw pain (6 on pain scale). Did not notice any abnormal EKG strips during exercise. Patient was transported to ED for further observation. Patient stated that he was having the same problems the entire weekend. I feel like the patient may not call for a follow up appointment on his own. [name], Production designer, theatre/television/film of Cardiac Rehab at [facility]

## 2012-09-04 ENCOUNTER — Encounter (HOSPITAL_COMMUNITY): Payer: BC Managed Care – PPO

## 2012-09-06 ENCOUNTER — Encounter (HOSPITAL_COMMUNITY): Payer: BC Managed Care – PPO

## 2012-09-08 ENCOUNTER — Encounter (HOSPITAL_COMMUNITY)
Admission: RE | Admit: 2012-09-08 | Discharge: 2012-09-08 | Disposition: A | Discharge status: Home / Self Care | Payer: BC Managed Care – PPO | Source: Ambulatory Visit | Attending: Cardiovascular Disease | Admitting: Cardiovascular Disease

## 2012-10-10 ENCOUNTER — Encounter: Payer: Self-pay | Admitting: Cardiovascular Disease

## 2012-10-10 NOTE — Telephone Encounter (Signed)
This encounter was created in error - please disregard.

## 2012-10-10 NOTE — Telephone Encounter (Signed)
Follow-up:    Called in to see if the cardiac rehab sheet that she had faxed over had been signed by Dr. Excell Seltzer. Please call back.

## 2012-10-10 NOTE — Patient Instructions (Signed)
Pt has graduated from Cardiac Rehab on 09/08/12. Pt home exercise packet was given and discussed. Patient expressed an understanding. Patient was told that a follow up call would be done for 1 year.

## 2012-10-10 NOTE — Progress Notes (Signed)
Patient graduated from Cardiac Rehabilitation today on 09/08/12 after completing 36 sessions. He achieved LTG of 30 minutes of aerobic exercise at Max Met level of 3.89. All patients vitals are WNL. Patient has met with dietician. Discharge instruction has been reviewed in detail and patient stated an understanding of material given. Patient plans to exercise at home on his Treadmill. Cardiac Rehab staff will make f/u calls at 1 month, 6 months, and 1 year. Patient had no complaints of any abnormal S/S or pain on their exit visit.

## 2012-10-11 NOTE — Progress Notes (Signed)
Cardiac Rehabilitation Program Outcomes Report   Orientation:  06/01/2012 Graduate Date:  09/08/2012 Discharge Date:  09/08/2012 # of sessions completed: 36 DX: CAD ans Stent X1  Cardiologist: Excell Seltzer Family MD:  Truitt Merle Class Time:  09:30  A.  Exercise Program:  Tolerates exercise @ 3.89 METS for 15 minutes, Bike Test Results:  Post: ^38 steps in 6 min 1.5 mph, No Change functional capacity  0 %, No Change  muscular strength  0 %, No Change  flexibility 0 % and Discharged to home exercise program.  Anticipated compliance:  good  B.  Mental Health:  Good mental attitude and Quality of Life (QOL)  changes:  Overall  0.95 %, Health/Functioning 8.51 %, Socioeconomics -16.37 %, Psych/Spiritual 29.17 %, Family -22.73 %    C.  Education/Instruction/Skills  Knows THR for exercise, Uses Perceived Exertion Scale and/or Dyspnea Scale and Attended all education classes  Uses Perceived Exertion Scale and/or Dyspnea Scale  D.  Nutrition/Weight Control/Body Composition:  Adherence to prescribed nutrition program: good  and Patient has gained 0.4 kg   E.  Blood Lipids    No results found for this basename: CHOL, HDL, LDLCALC, LDLDIRECT, TRIG, CHOLHDL    F.  Lifestyle Changes:  Making positive lifestyle changes  G.  Symptoms noted with exercise:  Asymptomatic  Report Completed By:  Lelon Huh. [name] RN   Comments:  This is patients graduation report. He achieved a peak METS of 3.89. He has done well with rehab. His resting Hr was 66 and BP was 100/62. His peak HR was 79 and His peak BP was 120/60.

## 2012-10-11 NOTE — Progress Notes (Signed)
Cardiac Rehabilitation Program Outcomes Report   Orientation:  06/01/2012 1st week visit: 06/09/2012 Graduate Date:  tbd Discharge Date:  tbd # of sessions completed: 3  Cardiologist: Excell Seltzer Family MD:  Truitt Merle Class Time:  09:30  A.  Exercise Program:  Tolerates exercise @ 3.89  METS for 15 minutes, No Change functional capacity  0 %, No Change  muscular strength  0 % and No Change  flexibility 0 %  B.  Mental Health:  Good mental attitude and Quality of Life (QOL)  changes:  Overall  0,95% %, Health/Functioning 8.51 %, Socioeconomics -16.37 %, Psych/Spiritual 29.17 %, Family -22.73 %    C.  Education/Instruction/Skills  Accurately checks own pulse.  Rest:  82  Exercis : 103 and Knows THR for exercise  Uses Perceived Exertion Scale and/or Dyspnea Scale  D.  Nutrition/Weight Control/Body Composition:  % Body Fat  31.2 and Weight loss not indicated   E.  Blood Lipids    No results found for this basename: CHOL, HDL, LDLCALC, LDLDIRECT, TRIG, CHOLHDL    F.  Lifestyle Changes:  Making positive lifestyle changes  G.  Symptoms noted with exercise:  Asymptomatic  Report Completed By:  Lelon Huh. Arohi Salvatierra RN   Comments:  This is patients 1st week report. He achieved a peak mets Of 3.89. His resting HR was 82 and resting BP was 120/80. His Peak HR was 103 and his peak BP was 120/80. A report will follow on His 18 th visit.

## 2012-10-11 NOTE — Progress Notes (Signed)
Cardiac Rehabilitation Program Outcomes Report   Orientation:  06/01/2012 !8th visit : 07/17/2012 Graduate Date:  tbd Discharge Date:  tbd # of sessions completed: 18 DX: CAD and Stent X 1  Cardiologist:  Excell Seltzer Family MD:  Truitt Merle Class Time:  09:30  A.  Exercise Program:  Tolerates exercise @ 3.89 METS for 15 minutes  B.  Mental Health:  Good mental attitude  C.  Education/Instruction/Skills  Knows THR for exercise and Uses Perceived Exertion Scale and/or Dyspnea Scale  Uses Perceived Exertion Scale and/or Dyspnea Scale  D.  Nutrition/Weight Control/Body Composition:  Adherence to prescribed nutrition program: good    E.  Blood Lipids    No results found for this basename: CHOL, HDL, LDLCALC, LDLDIRECT, TRIG, CHOLHDL    F.  Lifestyle Changes:  Making positive lifestyle changes  G.  Symptoms noted with exercise:  Asymptomatic  Report Completed By:  Angelica Pou RN   Comments:  This is patients halfway report. He has done well in rehab. He achieved a peak Mets of 3.89. His resting HR was 65 and resting Bp was 110/68. His peak HR was 100 and peak BP was 130/80. A graduation report will follow.

## 2012-12-22 ENCOUNTER — Other Ambulatory Visit (HOSPITAL_COMMUNITY): Payer: Self-pay | Admitting: Nurse Practitioner

## 2013-02-13 ENCOUNTER — Encounter: Payer: Self-pay | Admitting: *Deleted

## 2013-02-13 ENCOUNTER — Encounter: Payer: Self-pay | Admitting: Cardiovascular Disease

## 2013-02-13 ENCOUNTER — Ambulatory Visit (INDEPENDENT_AMBULATORY_CARE_PROVIDER_SITE_OTHER): Payer: BC Managed Care – PPO | Admitting: Cardiovascular Disease

## 2013-02-13 ENCOUNTER — Encounter (HOSPITAL_COMMUNITY): Payer: Self-pay | Admitting: Respiratory Therapy

## 2013-02-13 VITALS — BP 100/74 | HR 66 | Ht 69.0 in | Wt 248.0 lb

## 2013-02-13 DIAGNOSIS — I208 Other forms of angina pectoris: Secondary | ICD-10-CM

## 2013-02-13 DIAGNOSIS — I209 Angina pectoris, unspecified: Secondary | ICD-10-CM

## 2013-02-13 LAB — CBC WITH DIFFERENTIAL/PLATELET
Basophils Absolute: 0.1 10*3/uL (ref 0.0–0.1)
Basophils Relative: 0.9 % (ref 0.0–3.0)
HCT: 42.3 % (ref 39.0–52.0)
Hemoglobin: 14.2 g/dL (ref 13.0–17.0)
Lymphocytes Relative: 28.1 % (ref 12.0–46.0)
Lymphs Abs: 1.9 10*3/uL (ref 0.7–4.0)
MCHC: 33.5 g/dL (ref 30.0–36.0)
Monocytes Relative: 10.4 % (ref 3.0–12.0)
Neutro Abs: 3.9 10*3/uL (ref 1.4–7.7)
RBC: 4.94 Mil/uL (ref 4.22–5.81)
RDW: 14.6 % (ref 11.5–14.6)

## 2013-02-13 LAB — BASIC METABOLIC PANEL
BUN: 14 mg/dL (ref 6–23)
CO2: 28 mEq/L (ref 19–32)
Calcium: 9.2 mg/dL (ref 8.4–10.5)
Chloride: 100 mEq/L (ref 96–112)
Creatinine, Ser: 0.7 mg/dL (ref 0.4–1.5)

## 2013-02-13 NOTE — Progress Notes (Signed)
HPI:  62 year old gentleman presenting for followup evaluation. The patient is followed for coronary artery disease. He underwent PCI of the right coronary artery in 2013. He has moderate stenosis of the LAD and has been treated medically.  The patient has been experiencing increased angina of late. He describes substernal chest pressure with light to moderate activities, such as doing light yard work. He has been unable to do much without resting frequently. His chest pain is generally relieved within 5-10 minutes of rest. Nitroglycerin helps to relieve his pain within just a few minutes. He admits to associated dyspnea. He denies diaphoresis, nausea, or vomiting. He has been compliant with his medical program.  Outpatient Encounter Prescriptions as of 02/13/2013  Medication Sig Dispense Refill  . amLODipine (NORVASC) 5 MG tablet Take 1 tablet (5 mg total) by mouth daily.  30 tablet  6  . aspirin EC 81 MG EC tablet Take 1 tablet (81 mg total) by mouth daily.      . Cholecalciferol (VITAMIN D-3) 1000 UNITS CAPS Take 1,000 Units by mouth daily.      . clopidogrel (PLAVIX) 75 MG tablet Take 1 tablet (75 mg total) by mouth daily with breakfast.  90 tablet  3  . isosorbide mononitrate (IMDUR) 60 MG 24 hr tablet TAKE ONE AND ONE HALF TABLET DAILY  30 tablet  12  . lisinopril-hydrochlorothiazide (PRINZIDE,ZESTORETIC) 20-12.5 MG per tablet Take 1 tablet by mouth Daily.      . metoprolol tartrate (LOPRESSOR) 25 MG tablet Take 1 tablet (25 mg total) by mouth 2 (two) times daily.  60 tablet  1  . nitroGLYCERIN (NITROSTAT) 0.4 MG SL tablet Place 1 tablet (0.4 mg total) under the tongue every 5 (five) minutes x 3 doses as needed for chest pain.  25 tablet  3  . Omega-3 Fatty Acids (FISH OIL) 1200 MG CAPS Take 3 capsules by mouth daily.      . simvastatin (ZOCOR) 10 MG tablet Take 1 tablet (10 mg total) by mouth at bedtime.  90 tablet  3  . [DISCONTINUED] amLODipine (NORVASC) 5 MG tablet TAKE 1 TABLET DAILY   90 tablet  2  . [DISCONTINUED] cholecalciferol (VITAMIN D) 1000 UNITS tablet Take 1,000 Units by mouth daily.       No facility-administered encounter medications on file as of 02/13/2013.    No Known Allergies  Past Medical History  Diagnosis Date  . CAD (coronary artery disease)     a. 03/2012 Cath: LAD 80, RCA 80, nl EF;  b. PCI of RCA w 3.0x50mm Promus DES, FFR of LAD was nl @ 0.85 ->Med Rx.  Marland Kitchen Hypertension   . Arthritis   . Hyperlipidemia   . Obesity     ROS: Negative except as per HPI  BP 100/74  Pulse 66  Ht 5\' 9"  (1.753 m)  Wt 112.492 kg (248 lb)  BMI 36.61 kg/m2  SpO2 95%  PHYSICAL EXAM: Pt is alert and oriented, pleasant obese male in NAD HEENT: normal Neck: JVP - normal, carotids 2+= without bruits Lungs: CTA bilaterally CV: RRR without murmur or gallop Abd: soft, NT, Positive BS, no hepatomegaly Back: Nontender to palpation Ext: no C/C/E, distal pulses intact and equal Skin: warm/dry no rash Neurologic: Cranial nerves II through XII are intact, strength is intact and equal bilaterally  EKG:  Normal sinus rhythm 66 beats per minute, within normal limits.  PCI note 04/24/2012: Procedure: PTCA and stenting of the RCA, FFR of the LAD  Indication: Unstable  angina. 62 year old gentleman who presented with unstable angina and underwent diagnostic catheterization last week. He was found to have severe proximal RCA stenosis, moderate proximal LAD stenosis, and subtotal occlusion of a tiny high diagonal branch. After review of his films, we elected to treat the right coronary artery with stenting, performed pressure wire analysis of the proximal LAD, and treat the high diagonal medically because it is a very small diameter vessel.  Procedural Details: The right wrist was prepped, draped, and anesthetized with 1% lidocaine. Using the modified Seldinger technique, a 6 Fr sheath was introduced into the radial artery. 2.5 mg nicardipine was administered through the radial  sheath. Weight-based bivalirudin was given for anticoagulation. Once a therapeutic ACT was achieved, a 6 [place] JR 4 guide catheter was inserted. A cougar coronary guidewire was used to cross the lesion. The lesion was predilated with a 2.5 x 15 mm balloon. The lesion was then stented with a 3.0 x 24 mm Promus element drug-eluting stent. The stent was postdilated with a 3.25 x 12 mm [place] Quantum apex noncompliant balloon to 18 atmospheres on 2 inflations. Following PCI, there was 0% residual stenosis and TIMI-3 flow. Final angiography confirmed an excellent result.  Attention was then turned to the LAD. An XB LAD 3.5 cm guide catheter was inserted. A pressure wire was advanced across the area of moderate stenosis in the proximal vessel. The wire had been appropriately normalized at the end of the guide catheter. Intravenous adenosine was administered. The FFR at peak hyperemia was 0.85. The guidewire was removed and angiography demonstrated stable appearance of the LAD. The patient tolerated the procedure well. There were no immediate procedural complications. A TR band was used for radial hemostasis. The patient was transferred to the post catheterization recovery area for further monitoring.  Lesion Data:  Vessel: RCA/proximal  Percent stenosis (pre): 90  TIMI-flow (pre): 3  Stent: 3.0 x 24 mm DES  Percent stenosis (post): 0  TIMI-flow (post): 3  Conclusions:  1. Successful stenting of severe stenosis in the proximal right coronary artery  2. Negative pressure wire analysis of the proximal LAD  Recommendations: Dual antiplatelet therapy with aspirin and Plavix for at least 12 months. Aggressive medical therapy.  Kyle Lambert  04/24/2012, 5:46 PM  ASSESSMENT AND PLAN: 1. Coronary artery disease, native vessel, With CCS class III angina on maximal medical therapy with 3 antianginal agents. The patient is significantly limited. We know he has a least moderate stenosis in the LAD distribution. There is  little room to increase his antianginal program. I have recommended that we proceed with cardiac catheterization and possible PCI. I have reviewed the risks, indications, and alternatives to cath and PCI. These risks include bleeding, vascular injury, stroke, myocardial infarction, emergency heart surgery, and death. The patient understands and agrees to proceed.  2. Hypertension. Pressure controlled on combination of amlodipine, metoprolol, and isosorbide as well as lisinopril/hydrochlorothiazide.  3. Hyperlipidemia. He is treated with low dose simvastatin in the setting of calcium channel blocker use. Lipids are followed by his primary care physician.  4. Obesity. Lifestyle modification recommendations were made today.  Kyle Lambert 02/15/2013 5:33 AM

## 2013-02-14 ENCOUNTER — Other Ambulatory Visit: Payer: Self-pay | Admitting: Cardiovascular Disease

## 2013-02-15 ENCOUNTER — Encounter (HOSPITAL_COMMUNITY)
Admission: RE | Disposition: A | Discharge status: Home / Self Care | Payer: Self-pay | Source: Ambulatory Visit | Attending: Cardiovascular Disease

## 2013-02-15 ENCOUNTER — Encounter: Payer: Self-pay | Admitting: Cardiovascular Disease

## 2013-02-15 ENCOUNTER — Encounter (HOSPITAL_COMMUNITY): Payer: Self-pay

## 2013-02-15 ENCOUNTER — Ambulatory Visit (HOSPITAL_COMMUNITY)
Admission: RE | Admit: 2013-02-15 | Discharge: 2013-02-16 | Disposition: A | Discharge status: Home / Self Care | Payer: BC Managed Care – PPO | Source: Ambulatory Visit | Attending: Cardiovascular Disease | Admitting: Cardiovascular Disease

## 2013-02-15 DIAGNOSIS — E669 Obesity, unspecified: Secondary | ICD-10-CM | POA: Insufficient documentation

## 2013-02-15 DIAGNOSIS — I2510 Atherosclerotic heart disease of native coronary artery without angina pectoris: Secondary | ICD-10-CM

## 2013-02-15 DIAGNOSIS — I10 Essential (primary) hypertension: Secondary | ICD-10-CM | POA: Insufficient documentation

## 2013-02-15 DIAGNOSIS — I208 Other forms of angina pectoris: Secondary | ICD-10-CM

## 2013-02-15 DIAGNOSIS — I209 Angina pectoris, unspecified: Secondary | ICD-10-CM | POA: Insufficient documentation

## 2013-02-15 DIAGNOSIS — R0609 Other forms of dyspnea: Secondary | ICD-10-CM | POA: Insufficient documentation

## 2013-02-15 DIAGNOSIS — Z955 Presence of coronary angioplasty implant and graft: Secondary | ICD-10-CM

## 2013-02-15 DIAGNOSIS — E785 Hyperlipidemia, unspecified: Secondary | ICD-10-CM | POA: Insufficient documentation

## 2013-02-15 DIAGNOSIS — E782 Mixed hyperlipidemia: Secondary | ICD-10-CM | POA: Diagnosis present

## 2013-02-15 DIAGNOSIS — Z6836 Body mass index (BMI) 36.0-36.9, adult: Secondary | ICD-10-CM | POA: Insufficient documentation

## 2013-02-15 DIAGNOSIS — R0989 Other specified symptoms and signs involving the circulatory and respiratory systems: Secondary | ICD-10-CM | POA: Insufficient documentation

## 2013-02-15 HISTORY — PX: CORONARY ANGIOPLASTY WITH STENT PLACEMENT: SHX49

## 2013-02-15 HISTORY — PX: LEFT HEART CATHETERIZATION WITH CORONARY ANGIOGRAM: SHX5451

## 2013-02-15 HISTORY — PX: PERCUTANEOUS CORONARY STENT INTERVENTION (PCI-S): SHX5485

## 2013-02-15 LAB — POCT ACTIVATED CLOTTING TIME: Activated Clotting Time: 328 seconds

## 2013-02-15 SURGERY — LEFT HEART CATHETERIZATION WITH CORONARY ANGIOGRAM
Anesthesia: LOCAL

## 2013-02-15 MED ORDER — SODIUM CHLORIDE 0.9 % IJ SOLN: 3.0000 mL | INTRAMUSCULAR | Status: DC | PRN

## 2013-02-15 MED ORDER — SODIUM CHLORIDE 0.9 % IJ SOLN: 3.0000 mL | Freq: Two times a day (BID) | INTRAMUSCULAR | Status: DC

## 2013-02-15 MED ORDER — HEPARIN SODIUM (PORCINE) 1000 UNIT/ML IJ SOLN
INTRAMUSCULAR | Status: AC
  Filled 2013-02-15: qty 1

## 2013-02-15 MED ORDER — METOPROLOL TARTRATE 12.5 MG HALF TABLET
25.0000 mg | ORAL_TABLET | Freq: Two times a day (BID) | ORAL | Status: DC
  Administered 2013-02-15: 25 mg via ORAL
  Filled 2013-02-15 (×2): qty 2

## 2013-02-15 MED ORDER — ISOSORBIDE MONONITRATE ER 60 MG PO TB24: 90.0000 mg | ORAL_TABLET | Freq: Every day | ORAL | Status: DC

## 2013-02-15 MED ORDER — OXYCODONE-ACETAMINOPHEN 5-325 MG PO TABS: 1.0000 | ORAL_TABLET | ORAL | Status: DC | PRN

## 2013-02-15 MED ORDER — HEPARIN (PORCINE) IN NACL 2-0.9 UNIT/ML-% IJ SOLN
INTRAMUSCULAR | Status: AC
  Filled 2013-02-15: qty 1000

## 2013-02-15 MED ORDER — SODIUM CHLORIDE 0.9 % IV SOLN: 1.0000 mL/kg/h | INTRAVENOUS | Status: AC

## 2013-02-15 MED ORDER — ASPIRIN EC 81 MG PO TBEC: 81.0000 mg | DELAYED_RELEASE_TABLET | Freq: Every day | ORAL | Status: DC

## 2013-02-15 MED ORDER — FENTANYL CITRATE 0.05 MG/ML IJ SOLN
INTRAMUSCULAR | Status: AC
  Filled 2013-02-15: qty 2

## 2013-02-15 MED ORDER — LISINOPRIL 20 MG PO TABS
20.0000 mg | ORAL_TABLET | Freq: Every day | ORAL | Status: DC
  Filled 2013-02-15: qty 1

## 2013-02-15 MED ORDER — ACETAMINOPHEN 325 MG PO TABS: 650.0000 mg | ORAL_TABLET | ORAL | Status: DC | PRN

## 2013-02-15 MED ORDER — MIDAZOLAM HCL 2 MG/2ML IJ SOLN
INTRAMUSCULAR | Status: AC
  Filled 2013-02-15: qty 2

## 2013-02-15 MED ORDER — DIAZEPAM 5 MG PO TABS
5.0000 mg | ORAL_TABLET | ORAL | Status: AC
  Administered 2013-02-15: 5 mg via ORAL

## 2013-02-15 MED ORDER — SODIUM CHLORIDE 0.9 % IV SOLN: 250.0000 mL | INTRAVENOUS | Status: DC | PRN

## 2013-02-15 MED ORDER — AMLODIPINE BESYLATE 5 MG PO TABS: 5.0000 mg | ORAL_TABLET | Freq: Every day | ORAL | Status: DC

## 2013-02-15 MED ORDER — DIAZEPAM 5 MG PO TABS
ORAL_TABLET | ORAL | Status: AC
  Administered 2013-02-15: 5 mg via ORAL
  Filled 2013-02-15: qty 1

## 2013-02-15 MED ORDER — ONDANSETRON HCL 4 MG/2ML IJ SOLN: 4.0000 mg | Freq: Four times a day (QID) | INTRAMUSCULAR | Status: DC | PRN

## 2013-02-15 MED ORDER — SODIUM CHLORIDE 0.9 % IV SOLN
INTRAVENOUS | Status: DC
  Administered 2013-02-15: 1000 mL via INTRAVENOUS

## 2013-02-15 MED ORDER — VERAPAMIL HCL 2.5 MG/ML IV SOLN
INTRAVENOUS | Status: AC
  Filled 2013-02-15: qty 2

## 2013-02-15 MED ORDER — LIDOCAINE HCL (PF) 1 % IJ SOLN
INTRAMUSCULAR | Status: AC
  Filled 2013-02-15: qty 30

## 2013-02-15 MED ORDER — HYDROCHLOROTHIAZIDE 12.5 MG PO CAPS
12.5000 mg | ORAL_CAPSULE | Freq: Every day | ORAL | Status: DC
  Filled 2013-02-15: qty 1

## 2013-02-15 MED ORDER — LISINOPRIL-HYDROCHLOROTHIAZIDE 20-12.5 MG PO TABS: 1.0000 | ORAL_TABLET | Freq: Every day | ORAL | Status: DC

## 2013-02-15 MED ORDER — NITROGLYCERIN 0.4 MG SL SUBL: 0.4000 mg | SUBLINGUAL_TABLET | SUBLINGUAL | Status: DC | PRN

## 2013-02-15 MED ORDER — CLOPIDOGREL BISULFATE 75 MG PO TABS: 75.0000 mg | ORAL_TABLET | Freq: Every day | ORAL | Status: DC

## 2013-02-15 MED ORDER — SIMVASTATIN 10 MG PO TABS
10.0000 mg | ORAL_TABLET | Freq: Every day | ORAL | Status: DC
  Administered 2013-02-15: 10 mg via ORAL
  Filled 2013-02-15 (×4): qty 1

## 2013-02-15 NOTE — Interval H&P Note (Signed)
History and Physical Interval Note:  02/15/2013 1:56 PM  Kyle Lambert  has presented today for surgery, with the diagnosis of angina  The various methods of treatment have been discussed with the patient and family. After consideration of risks, benefits and other options for treatment, the patient has consented to  Procedure(s): LEFT HEART CATHETERIZATION WITH CORONARY ANGIOGRAM (N/A) as a surgical intervention .  The patient's history has been reviewed, patient examined, no change in status, stable for surgery.  I have reviewed the patient's chart and labs.  Questions were answered to the patient's satisfaction.     Tonny Bollman

## 2013-02-15 NOTE — H&P (View-Only) (Signed)
 HPI:  61-year-old gentleman presenting for followup evaluation. The patient is followed for coronary artery disease. He underwent PCI of the right coronary artery in 2013. He has moderate stenosis of the LAD and has been treated medically.  The patient has been experiencing increased angina of late. He describes substernal chest pressure with light to moderate activities, such as doing light yard work. He has been unable to do much without resting frequently. His chest pain is generally relieved within 5-10 minutes of rest. Nitroglycerin helps to relieve his pain within just a few minutes. He admits to associated dyspnea. He denies diaphoresis, nausea, or vomiting. He has been compliant with his medical program.  Outpatient Encounter Prescriptions as of 02/13/2013  Medication Sig Dispense Refill  . amLODipine (NORVASC) 5 MG tablet Take 1 tablet (5 mg total) by mouth daily.  30 tablet  6  . aspirin EC 81 MG EC tablet Take 1 tablet (81 mg total) by mouth daily.      . Cholecalciferol (VITAMIN D-3) 1000 UNITS CAPS Take 1,000 Units by mouth daily.      . clopidogrel (PLAVIX) 75 MG tablet Take 1 tablet (75 mg total) by mouth daily with breakfast.  90 tablet  3  . isosorbide mononitrate (IMDUR) 60 MG 24 hr tablet TAKE ONE AND ONE HALF TABLET DAILY  30 tablet  12  . lisinopril-hydrochlorothiazide (PRINZIDE,ZESTORETIC) 20-12.5 MG per tablet Take 1 tablet by mouth Daily.      . metoprolol tartrate (LOPRESSOR) 25 MG tablet Take 1 tablet (25 mg total) by mouth 2 (two) times daily.  60 tablet  1  . nitroGLYCERIN (NITROSTAT) 0.4 MG SL tablet Place 1 tablet (0.4 mg total) under the tongue every 5 (five) minutes x 3 doses as needed for chest pain.  25 tablet  3  . Omega-3 Fatty Acids (FISH OIL) 1200 MG CAPS Take 3 capsules by mouth daily.      . simvastatin (ZOCOR) 10 MG tablet Take 1 tablet (10 mg total) by mouth at bedtime.  90 tablet  3  . [DISCONTINUED] amLODipine (NORVASC) 5 MG tablet TAKE 1 TABLET DAILY   90 tablet  2  . [DISCONTINUED] cholecalciferol (VITAMIN D) 1000 UNITS tablet Take 1,000 Units by mouth daily.       No facility-administered encounter medications on file as of 02/13/2013.    No Known Allergies  Past Medical History  Diagnosis Date  . CAD (coronary artery disease)     a. 03/2012 Cath: LAD 80, RCA 80, nl EF;  b. PCI of RCA w 3.0x24mm Promus DES, FFR of LAD was nl @ 0.85 ->Med Rx.  . Hypertension   . Arthritis   . Hyperlipidemia   . Obesity     ROS: Negative except as per HPI  BP 100/74  Pulse 66  Ht 5' 9" (1.753 m)  Wt 112.492 kg (248 lb)  BMI 36.61 kg/m2  SpO2 95%  PHYSICAL EXAM: Pt is alert and oriented, pleasant obese male in NAD HEENT: normal Neck: JVP - normal, carotids 2+= without bruits Lungs: CTA bilaterally CV: RRR without murmur or gallop Abd: soft, NT, Positive BS, no hepatomegaly Back: Nontender to palpation Ext: no C/C/E, distal pulses intact and equal Skin: warm/dry no rash Neurologic: Cranial nerves II through XII are intact, strength is intact and equal bilaterally  EKG:  Normal sinus rhythm 66 beats per minute, within normal limits.  PCI note 04/24/2012: Procedure: PTCA and stenting of the RCA, FFR of the LAD  Indication: Unstable   angina. 60-year-old gentleman who presented with unstable angina and underwent diagnostic catheterization last week. He was found to have severe proximal RCA stenosis, moderate proximal LAD stenosis, and subtotal occlusion of a tiny high diagonal branch. After review of his films, we elected to treat the right coronary artery with stenting, performed pressure wire analysis of the proximal LAD, and treat the high diagonal medically because it is a very small diameter vessel.  Procedural Details: The right wrist was prepped, draped, and anesthetized with 1% lidocaine. Using the modified Seldinger technique, a 6 Fr sheath was introduced into the radial artery. 2.5 mg nicardipine was administered through the radial  sheath. Weight-based bivalirudin was given for anticoagulation. Once a therapeutic ACT was achieved, a 6 [place] JR 4 guide catheter was inserted. A cougar coronary guidewire was used to cross the lesion. The lesion was predilated with a 2.5 x 15 mm balloon. The lesion was then stented with a 3.0 x 24 mm Promus element drug-eluting stent. The stent was postdilated with a 3.25 x 12 mm [place] Quantum apex noncompliant balloon to 18 atmospheres on 2 inflations. Following PCI, there was 0% residual stenosis and TIMI-3 flow. Final angiography confirmed an excellent result.  Attention was then turned to the LAD. An XB LAD 3.5 cm guide catheter was inserted. A pressure wire was advanced across the area of moderate stenosis in the proximal vessel. The wire had been appropriately normalized at the end of the guide catheter. Intravenous adenosine was administered. The FFR at peak hyperemia was 0.85. The guidewire was removed and angiography demonstrated stable appearance of the LAD. The patient tolerated the procedure well. There were no immediate procedural complications. A TR band was used for radial hemostasis. The patient was transferred to the post catheterization recovery area for further monitoring.  Lesion Data:  Vessel: RCA/proximal  Percent stenosis (pre): 90  TIMI-flow (pre): 3  Stent: 3.0 x 24 mm DES  Percent stenosis (post): 0  TIMI-flow (post): 3  Conclusions:  1. Successful stenting of severe stenosis in the proximal right coronary artery  2. Negative pressure wire analysis of the proximal LAD  Recommendations: Dual antiplatelet therapy with aspirin and Plavix for at least 12 months. Aggressive medical therapy.  Kyle Lambert  04/24/2012, 5:46 PM  ASSESSMENT AND PLAN: 1. Coronary artery disease, native vessel, With CCS class III angina on maximal medical therapy with 3 antianginal agents. The patient is significantly limited. We know he has a least moderate stenosis in the LAD distribution. There is  little room to increase his antianginal program. I have recommended that we proceed with cardiac catheterization and possible PCI. I have reviewed the risks, indications, and alternatives to cath and PCI. These risks include bleeding, vascular injury, stroke, myocardial infarction, emergency heart surgery, and death. The patient understands and agrees to proceed.  2. Hypertension. Pressure controlled on combination of amlodipine, metoprolol, and isosorbide as well as lisinopril/hydrochlorothiazide.  3. Hyperlipidemia. He is treated with low dose simvastatin in the setting of calcium channel blocker use. Lipids are followed by his primary care physician.  4. Obesity. Lifestyle modification recommendations were made today.  Kyle Lambert 02/15/2013 5:33 AM     

## 2013-02-15 NOTE — CV Procedure (Signed)
Cardiac Catheterization Procedure Note  Name: Kyle Lambert MRN: [id number] DOB: 06-18-1951  Procedure: Left Heart Cath, Selective Coronary Angiography, LV angiography, PTCA and stenting of the mid LAD, PTCA of the first diagonal  Indication: CCS class III angina despite maximal medical therapy with 3 antianginal drugs.  Procedural Details:  The right wrist was prepped, draped, and anesthetized with 1% lidocaine. Using the modified Seldinger technique, a 5 French sheath was introduced into the right radial artery. 3 mg of verapamil was administered through the sheath, weight-based unfractionated heparin was administered intravenously. Standard Judkins catheters were used for selective coronary angiography and left ventriculography. Catheter exchanges were performed over an exchange length guidewire.  PROCEDURAL FINDINGS Hemodynamics: AO 88/52 LV 89/7   Coronary angiography: Coronary dominance: right  Left mainstem: The left mainstem is short. There is no significant stenosis present.  Left anterior descending (LAD): The LAD is patent. The vessel has moderate proximal calcification. The mid vessel has a 95% stenosis. This is a focal napkin ring type stenosis. The distal vessel is diffusely diseased with severe stenosis near the apex. The proximal LAD has 40-50% stenosis. There is a small first diagonal branch with 95-99% stenosis.  Left circumflex (LCx): The left circumflex is patent. The vessel supplies 2 major OM branches. The first OM gives off multiple subbranches. The proximal circumflex has nonobstructive stenosis. The mid circumflex as it enters the OM has diffuse 30-40% stenosis  Right coronary artery (RCA): The RCA is a large, dominant vessel. The stented segment in the proximal vessel is widely patent. There is minor ostial stenosis before the stent. This is no more than 20-30% stenosis. The mid and distal vessel are patent. The origin of the PDA branch has 50% stenosis. This is  unchanged from the previous study.  Left ventriculography: Left ventricular systolic function is normal, LVEF is estimated at 55-65%, there is no significant mitral regurgitation   PCI Note:  Following the diagnostic procedure, the decision was made to proceed with PCI. The patient has progressive, severe stenosis in the mid LAD. This lesion is focal and I thought it was amenable to PCI. There is also high-grade diagonal stenosis involving a small vessel. I plan on approaching this with plain balloon angioplasty. The radial sheath was upsized to a 6 [place]. Weight-based unfractionated heparin was given for anticoagulation. The patient has been maintained on long-term aspirin and Plavix. Once a therapeutic ACT was achieved, a 6 [place] XB LAD guide catheter was inserted.  A pro-water coronary guidewire was used to cross the lesion in the mid LAD.  The lesion was predilated with a 2.0 mm balloon.  The lesion was then stented with a 2.25 x 12 mm Promus premier drug-eluting stent.  The stent was postdilated with a 2.25 mm noncompliant balloon.  Following PCI, there was 0% residual stenosis and TIMI-3 flow. Attention was then turned to the first diagonal branch. I initially tried to wire the vessel with a prolonged her wire. This was unsuccessful. A whisper wire was utilized successfully. The vessel was dilated with a 1.5 x 10 mm balloon to 10 atmospheres for 60 seconds and again to 12 atmospheres for about 45 seconds. This improved the percent stenosis from 99 down to 40% post PTCA. I felt this was the best we could do in a small vessel. The vessel was not amenable to stenting. Final angiography confirmed an excellent result. The patient tolerated the procedure well. There were no immediate procedural complications. A TR band was used for  radial hemostasis. The patient was transferred to the post catheterization recovery area for further monitoring.  PCI Data: Lesion 1: Vessel - LAD/Segment - mid Percent  Stenosis (pre)  90 TIMI-flow 3 Stent 2.25 x 12 mm drug-eluting Percent Stenosis (post) 0 TIMI-flow (post) 3  Lesion 2: Vessel - diagonal 1/Segment - proximal Percent Stenosis (pre)  99 TIMI-flow 3 Stent none Percent Stenosis (post) 40 TIMI-flow (post) 3  Final Conclusions:   1. Continued patency of the stented segment in the right coronary artery with nonobstructive stenosis in the right PDA 2. Diffuse nonobstructive left circumflex stenosis 3. Severe mid LAD stenosis with successful PCI and severe diagonal stenosis treated successfully with balloon angioplasty 4. Preserved left ventricular systolic function with an estimated left ventricular ejection fraction of 60%   Recommendations:  Continue dual antiplatelet therapy and medical treatment for CAD.  Tonny Bollman 02/15/2013, 3:15 PM

## 2013-02-16 ENCOUNTER — Encounter (HOSPITAL_COMMUNITY): Payer: Self-pay | Admitting: Physician Assistant

## 2013-02-16 DIAGNOSIS — I209 Angina pectoris, unspecified: Secondary | ICD-10-CM

## 2013-02-16 LAB — BASIC METABOLIC PANEL
CO2: 26 mEq/L (ref 19–32)
Chloride: 98 mEq/L (ref 96–112)
Glucose, Bld: 105 mg/dL — ABNORMAL HIGH (ref 70–99)
Potassium: 4 mEq/L (ref 3.5–5.1)
Sodium: 135 mEq/L (ref 135–145)

## 2013-02-16 LAB — CBC
Hemoglobin: 13.6 g/dL (ref 13.0–17.0)
RBC: 4.8 MIL/uL (ref 4.22–5.81)

## 2013-02-16 NOTE — Progress Notes (Signed)
CARDIAC REHAB PHASE I   PRE:  Rate/Rhythm: 62SR  BP:  Supine: 110/79  Sitting:   Standing:    SaO2:   MODE:  Ambulation: 700 ft   POST:  Rate/Rhythm: 72SR  BP:  Supine:   Sitting: 131/81  Standing:    SaO2:  0730-0810 Pt completed CRP 2 recently and would like to return to program. Will refer to [place] Phase 2.  Pt walked 700 ft with steady gait. Denied CP. Slightly SOB. Pt states he needs to resume following diet more closely as he has slipped. Brief review of diet,ex,stent, and NTG use done. Pt voiced understanding.    Luetta Nutting, RN BSN  02/16/2013 8:04 AM

## 2013-02-16 NOTE — Discharge Summary (Signed)
Discharge Summary   Patient ID: Kyle Lambert,  MRN: [id number], DOB/AGE: [birth year] 62 y.o.  Admit date: 02/15/2013 Discharge date: 02/16/2013  Primary Physician: Juliette Alcide, MD Primary Cardiologist: Judie Petit. Excell Seltzer, MD   Discharge Diagnoses Principal Problem:   Angina, class III  - Cardiac cath + PCI 02/15/13: patent RCA stent, diffuse nonobstructive R PDA, LCx disease, 99% mid LAD stenosis s/p DES-mid LAD; LVEF 60%  - To resume DAPT Active Problems:   HTN (hypertension)   CAD (coronary artery disease)  - To resume ASA/Plavix/ACEi/BB  - Continue antianginals in Norvasc, Imdur, NTG SL PRN also   Hyperlipidemia  - To resume statin   Obesity  Allergies No Known Allergies  Diagnostic Studies/Procedures  CARDIAC CATHETERIZATION + PERCUTANEOUS CORONARY INTERVENTION - 02/15/13  Hemodynamics:  AO 88/52  LV 89/7  Coronary angiography:  Coronary dominance: right  Left mainstem: The left mainstem is short. There is no significant stenosis present.  Left anterior descending (LAD): The LAD is patent. The vessel has moderate proximal calcification. The mid vessel has a 95% stenosis. This is a focal napkin ring type stenosis. The distal vessel is diffusely diseased with severe stenosis near the apex. The proximal LAD has 40-50% stenosis. There is a small first diagonal branch with 95-99% stenosis.  Left circumflex (LCx): The left circumflex is patent. The vessel supplies 2 major OM branches. The first OM gives off multiple subbranches. The proximal circumflex has nonobstructive stenosis. The mid circumflex as it enters the OM has diffuse 30-40% stenosis  Right coronary artery (RCA): The RCA is a large, dominant vessel. The stented segment in the proximal vessel is widely patent. There is minor ostial stenosis before the stent. This is no more than 20-30% stenosis. The mid and distal vessel are patent. The origin of the PDA branch has 50% stenosis. This is unchanged from the previous study.    Left ventriculography: Left ventricular systolic function is normal, LVEF is estimated at 55-65%, there is no significant mitral regurgitation   PCI Data:  Lesion 1:  Vessel - LAD/Segment - mid  Percent Stenosis (pre) 90  TIMI-flow 3  Stent 2.25 x 12 mm drug-eluting  Percent Stenosis (post) 0  TIMI-flow (post) 3  Lesion 2:  Vessel - diagonal 1/Segment - proximal  Percent Stenosis (pre) 99  TIMI-flow 3  Stent none  Percent Stenosis (post) 40  TIMI-flow (post) 3   Final Conclusions:  1. Continued patency of the stented segment in the right coronary artery with nonobstructive stenosis in the right PDA  2. Diffuse nonobstructive left circumflex stenosis  3. Severe mid LAD stenosis with successful PCI and severe diagonal stenosis treated successfully with balloon angioplasty  4. Preserved left ventricular systolic function with an estimated left ventricular ejection fraction of 60%   History of Present Illness Kyle Lambert is a 62 y.o. male with the above problem list who presented on 02/15/13 with the planned outpatient diagnostic cardiac catheterization.  His history of CAD status post PCI-RCA in 2013. He has moderate stenosis of the LAD and and been treated medically. He also has a history of hypertension, hypokalemia and obesity. He followup with Dr. Excell Seltzer on 02/13/13 complaining of increased substernal chest pressure with light to moderate activity such as doing yard work and numbness of his prior angina. His activity had been quite limited requiring him to rest frequently for relief which was accomplished within 5-10 minutes. He also endorsed alleviation with nitroglycerin. He noted associated dyspnea. He had been compliant with all  medications including several antianginals. There is no residual CAD, the plan was made to pursue PCI of the LAD lesion as an outpatient. He presented on 02/15/13 for this procedure.  Hospital Course  He was informed, consented and prepped via the right  radial artery. As above, this revealed continued patency of the stented RCA, nonobstructive right PDA disease, diffuse nonobstructive left circumflex disease, and severe mid LAD stenosis status post successful PCI with drug-eluting stent. LVEF 60%. He tolerated the procedure well without complications. The recommendation was made to continue dual antiplatelet therapy and all prior outpatient medications. He was observed overnight without incident. He ambulated well with cardiac rehabilitation. BMET & CBC this AM returned unremarkable. EKG without ischemic changes. He will continue CRP 2 in the outpatient setting in refill. He was evaluated by Dr. Excell Seltzer this morning and deemed stable for discharge. He will resume the medication regimen listed below. He will followup in 14 days as noted below. This information, including activity or shortness and post cath instructions, has been clearly outlined in the discharge AVS.   Discharge Vitals:  Blood pressure 110/79, pulse 61, temperature 97.9 F (36.6 C), temperature source Oral, height 5\' 9"  (1.753 m), weight 109.1 kg (240 lb 8.4 oz), SpO2 95.00%.   Labs: Recent Labs     02/13/13  1052  02/16/13  0425  WBC  6.8  7.6  HGB  14.2  13.6  HCT  42.3  39.4  MCV  85.6  82.1  PLT  308.0  236   Recent Labs Lab 02/13/13 1052 02/16/13 0425  NA 135 135  K 4.1 4.0  CL 100 98  CO2 28 26  BUN 14 12  CREATININE 0.7 0.73  CALCIUM 9.2 9.1  GLUCOSE 128* 105*   Disposition:  Discharge Orders   Future Appointments Provider Department Dept Phone   02/26/2013 12:10 PM Beatrice Lecher, PA-C [facility] Main [facility] [place]) [phone]   Future Orders Complete By Expires     Amb Referral to Cardiac Rehabilitation  As directed     Diet - low sodium heart healthy  As directed     Increase activity slowly  As directed       Follow-up Information   Follow up with Tereso Newcomer, PA-C On 02/26/2013. (At 12:10 PM for follow-up. )    Contact information:   [address] [place] [address] [phone]      Discharge Medications:    Medication List    TAKE these medications       amLODipine 5 MG tablet  Commonly known as:  NORVASC  Take 1 tablet (5 mg total) by mouth daily.     aspirin 81 MG EC tablet  Take 1 tablet (81 mg total) by mouth daily.     clopidogrel 75 MG tablet  Commonly known as:  PLAVIX  Take 1 tablet (75 mg total) by mouth daily with breakfast.     Fish Oil 1200 MG Caps  Take 3 capsules by mouth daily.     isosorbide mononitrate 60 MG 24 hr tablet  Commonly known as:  IMDUR  Take 90 mg by mouth daily.     lisinopril-hydrochlorothiazide 20-12.5 MG per tablet  Commonly known as:  PRINZIDE,ZESTORETIC  Take 1 tablet by mouth Daily.     metoprolol tartrate 25 MG tablet  Commonly known as:  LOPRESSOR  Take 1 tablet (25 mg total) by mouth 2 (two) times daily.     nitroGLYCERIN 0.4 MG  SL tablet  Commonly known as:  NITROSTAT  Place 1 tablet (0.4 mg total) under the tongue every 5 (five) minutes x 3 doses as needed for chest pain.     simvastatin 10 MG tablet  Commonly known as:  ZOCOR  Take 1 tablet (10 mg total) by mouth at bedtime.     Vitamin D-3 1000 UNITS Caps  Take 1,000 Units by mouth daily.       Outstanding Labs/Studies: None  Duration of Discharge Encounter: Greater than 30 minutes including physician time.  Signed, R. Hurman Horn, PA-C 02/16/2013, 8:58 AM

## 2013-02-16 NOTE — Progress Notes (Signed)
    Subjective:  Feels ok. No CP or dyspnea.  Objective:  Vital Signs in the last 24 hours: Temp:  [97.2 F (36.2 C)-97.8 F (36.6 C)] 97.8 F (36.6 C) (03/28 0456) Pulse Rate:  [54-88] 60 (03/27 2245) BP: (98-127)/(63-80) 111/66 mmHg (03/28 0448) SpO2:  [86 %-100 %] 86 % (03/27 2000) Weight:  [109.1 kg (240 lb 8.4 oz)-112.492 kg (248 lb)] 109.1 kg (240 lb 8.4 oz) (03/28 0456)  Intake/Output from previous day: 03/27 0701 - 03/28 0700 In: 240 [P.O.:240] Out: 600 [Urine:600]  Physical Exam: Pt is alert and oriented, NAD HEENT: normal Neck: JVP - normal Lungs: CTA bilaterally CV: RRR without murmur or gallop Abd: soft, NT, Positive BS, no hepatomegaly Ext: no C/C/E, distal pulses intact and equal, right radial site clear Skin: warm/dry no rash   Lab Results:  Recent Labs  02/13/13 1052 02/16/13 0425  WBC 6.8 7.6  HGB 14.2 13.6  PLT 308.0 236    Recent Labs  02/13/13 1052 02/16/13 0425  NA 135 135  K 4.1 4.0  CL 100 98  CO2 28 26  GLUCOSE 128* 105*  BUN 14 12  CREATININE 0.7 0.73   No results found for this basename: TROPONINI, CK, MB,  in the last 72 hours  Tele: Sinus rhythm  Assessment/Plan:  1. CCS Class 3 exertional angina: now s/p PCI of severe disease in the LAD/diagonal. Cont DAPT, statin drug, antianginal home regimen  2. HTN - well-controlled.  3. Hyperlipidemia - on statin drug. Followed by PCP.  4. Dispo - home today. F/U Tereso Newcomer 2 weeks.   Tonny Bollman, M.D. 02/16/2013, 6:52 AM

## 2013-02-20 ENCOUNTER — Telehealth: Payer: Self-pay | Admitting: *Deleted

## 2013-02-20 NOTE — Telephone Encounter (Signed)
TCM patient. Left message on cell and home phone for call back.

## 2013-02-22 NOTE — Telephone Encounter (Signed)
Called patient to check status and he states that he feels much better. Taking all medications and is aware of appointment with Tereso Newcomer on 4/7 at 12 10 pm.

## 2013-02-26 ENCOUNTER — Ambulatory Visit (INDEPENDENT_AMBULATORY_CARE_PROVIDER_SITE_OTHER): Payer: BC Managed Care – PPO | Admitting: Physician Assistant

## 2013-02-26 ENCOUNTER — Encounter: Payer: Self-pay | Admitting: Physician Assistant

## 2013-02-26 VITALS — BP 108/68 | HR 56 | Ht 69.0 in | Wt 249.8 lb

## 2013-02-26 DIAGNOSIS — I10 Essential (primary) hypertension: Secondary | ICD-10-CM

## 2013-02-26 DIAGNOSIS — I2510 Atherosclerotic heart disease of native coronary artery without angina pectoris: Secondary | ICD-10-CM

## 2013-02-26 DIAGNOSIS — E785 Hyperlipidemia, unspecified: Secondary | ICD-10-CM

## 2013-02-26 NOTE — Patient Instructions (Addendum)
Your physician recommends that you continue on your current medications as directed. Please refer to the Current Medication list given to you today.   I made  you  a follow-up appointment with Dr. Excell Seltzer  On April 25, 2013 at 3:00 pm

## 2013-02-26 NOTE — Progress Notes (Signed)
[address] [place], [place]  [address] Phone: [phone] Fax:  [phone]  Date:  02/26/2013   ID:  Kyle Lambert, DOB 1951-04-22, MRN [id number]  PCP:  Juliette Alcide, MD  Primary Cardiologist:  Dr. Tonny Bollman     History of Present Illness: Kyle Lambert is a 62 y.o. male who returns for f/u after a recent admission to the hospital for [place].  He has a hx of CAD, s/p DES to the RCA 5/13, HTN, HL. Of note, he had FFR of a moderate lesion in the LAD in 5/13 that was normal and treated medically. He recently saw Dr. Excell Seltzer in followup with chest discomfort concerning for CCS class III angina.  Cardiac catheterization was arranged.  LHC 02/15/13: proximal LAD 40-50%, mid LAD 95%, small D1 95-99%, mid CFX 30-40%, proximal RCA stent patent with ostial 20-30% prior to the stent, ostial PDA 50%, EF 55-65%. PCI:  Promus DES to the mid LAD and POBA to the proximal D1.  He did well post PCI.  Since d/c he continues to do well.  Denies further angina.  The patient denies chest pain, shortness of breath, syncope, orthopnea, PND or significant pedal edema.   Labs (3/14): K 4, Cr 0.73, Hgb 13.6  Wt Readings from Last 3 Encounters:  02/26/13 249 lb 12.8 oz (113.309 kg)  02/16/13 240 lb 8.4 oz (109.1 kg)  02/16/13 240 lb 8.4 oz (109.1 kg)     Past Medical History  Diagnosis Date  . CAD (coronary artery disease)     a. 03/2012 Cath: LAD 80, RCA 80, nl EF;  b. PCI of RCA w 3.0x64mm Promus DES, FFR of LAD was nl @ 0.85 ->Med Rx.;  c. LHC 02/15/13: proximal LAD 40-50%, mid LAD 95%, small D1 95-99%, mid CFX 30-40%, proximal RCA stent patent with ostial 20-30% prior to the stent, ostial PDA 50%, EF 55-65%. PCI:  Promus DES to the mid LAD and POBA to the proximal D1   . Hypertension   . Arthritis   . Hyperlipidemia   . Obesity     Current Outpatient Prescriptions  Medication Sig Dispense Refill  . amLODipine (NORVASC) 5 MG tablet Take 1 tablet (5 mg total) by mouth daily.  30  tablet  6  . aspirin EC 81 MG EC tablet Take 1 tablet (81 mg total) by mouth daily.      . Cholecalciferol (VITAMIN D-3) 1000 UNITS CAPS Take 1,000 Units by mouth daily.      . clopidogrel (PLAVIX) 75 MG tablet Take 1 tablet (75 mg total) by mouth daily with breakfast.  90 tablet  3  . isosorbide mononitrate (IMDUR) 60 MG 24 hr tablet Take 90 mg by mouth daily.      Marland Kitchen lisinopril-hydrochlorothiazide (PRINZIDE,ZESTORETIC) 20-12.5 MG per tablet Take 1 tablet by mouth Daily.      . metoprolol tartrate (LOPRESSOR) 25 MG tablet Take 1 tablet (25 mg total) by mouth 2 (two) times daily.  60 tablet  1  . nitroGLYCERIN (NITROSTAT) 0.4 MG SL tablet Place 1 tablet (0.4 mg total) under the tongue every 5 (five) minutes x 3 doses as needed for chest pain.  25 tablet  3  . Omega-3 Fatty Acids (FISH OIL) 1200 MG CAPS Take 3 capsules by mouth daily.      . simvastatin (ZOCOR) 10 MG tablet Take 1 tablet (10 mg total) by mouth at bedtime.  90 tablet  3   No current facility-administered medications for this  visit.    Allergies:   No Known Allergies  Social History:  The patient  reports that he has never smoked. He has never used smokeless tobacco. He reports that he does not drink alcohol or use illicit drugs.   ROS:  Please see the history of present illness.      All other systems reviewed and negative.   PHYSICAL EXAM: VS:  BP 108/68  Pulse 56  Ht 5\' 9"  (1.753 m)  Wt 249 lb 12.8 oz (113.309 kg)  BMI 36.87 kg/m2 Well nourished, well developed, in no acute distress HEENT: normal Neck: no JVD Cardiac:  normal S1, S2; RRR; no murmur Lungs:  clear to auscultation bilaterally, no wheezing, rhonchi or rales Abd: soft, nontender, no hepatomegaly Ext: no edema; right wrist without hematoma or bruit  Skin: warm and dry Neuro:  CNs 2-12 intact, no focal abnormalities noted  EKG:  Sinus brady, HR 56, normal axis, no acute changes      ASSESSMENT AND PLAN:  1. CAD:  Doing well s/p recent DES to the LAD  and POBA to the D1. No further angina.  We discussed the importance of dual antiplatelet therapy.  He prefers to increase activity on his own rather than participate in cardiac rehab.   2. Hypertension:  Controlled.  Continue current therapy.  3. Hyperlipidemia:  Managed by PCP.  4. Disposition:  F/u with Dr. Tonny Bollman in 2 mos.   Luna Glasgow, PA-C  12:39 PM 02/26/2013

## 2013-03-15 ENCOUNTER — Encounter (HOSPITAL_COMMUNITY)
Admission: RE | Admit: 2013-03-15 | Discharge: 2013-03-15 | Disposition: A | Discharge status: Home / Self Care | Payer: BC Managed Care – PPO | Source: Ambulatory Visit | Attending: Cardiovascular Disease | Admitting: Cardiovascular Disease

## 2013-03-15 ENCOUNTER — Encounter (HOSPITAL_COMMUNITY): Payer: Self-pay

## 2013-03-15 VITALS — BP 98/60 | HR 51 | Ht 69.0 in | Wt 252.3 lb

## 2013-03-15 DIAGNOSIS — I10 Essential (primary) hypertension: Secondary | ICD-10-CM | POA: Insufficient documentation

## 2013-03-15 DIAGNOSIS — I2510 Atherosclerotic heart disease of native coronary artery without angina pectoris: Secondary | ICD-10-CM | POA: Insufficient documentation

## 2013-03-15 DIAGNOSIS — Z9861 Coronary angioplasty status: Secondary | ICD-10-CM | POA: Insufficient documentation

## 2013-03-15 DIAGNOSIS — Z955 Presence of coronary angioplasty implant and graft: Secondary | ICD-10-CM

## 2013-03-15 DIAGNOSIS — Z5189 Encounter for other specified aftercare: Secondary | ICD-10-CM | POA: Insufficient documentation

## 2013-03-15 NOTE — Patient Instructions (Addendum)
Pt has finished orientation and is scheduled to start CR on 03/19/13 at 9:30 am. Pt has been instructed to arrive to class 15 minutes early for scheduled class. Pt has been instructed to wear comfortable clothing and shoes with rubber soles. Pt has been told to take their medications 1 hour prior to coming to class.  If the patient is not going to attend class, he/she has been instructed to call.   

## 2013-03-15 NOTE — Progress Notes (Addendum)
Patient referred to Cardiac Rehab by Dr. Tonny Bollman for Coronary Stent Placement and Balloon Angioplasty V45.82. During orientation advised patient on arrival and appointment times what to wear, what to do before, during and after exercise. Reviewed attendance and class policy. Talked about inclement weather and class consultation policy. Pt is scheduled to start Cardiac Rehab on 03/19/13 at 9:30am. Pt was advised to come to class 5 minutes before class starts. He was also given instructions on meeting with the dietician and attending the Family Structure classes. Pt is eager to get started. Did complete 6 minute pre-walk test.

## 2013-03-19 ENCOUNTER — Encounter (HOSPITAL_COMMUNITY)
Admission: RE | Admit: 2013-03-19 | Discharge: 2013-03-19 | Disposition: A | Discharge status: Home / Self Care | Payer: BC Managed Care – PPO | Source: Ambulatory Visit | Attending: Cardiovascular Disease | Admitting: Cardiovascular Disease

## 2013-03-21 ENCOUNTER — Encounter (HOSPITAL_COMMUNITY)
Admission: RE | Admit: 2013-03-21 | Discharge: 2013-03-21 | Disposition: A | Discharge status: Home / Self Care | Payer: BC Managed Care – PPO | Source: Ambulatory Visit | Attending: Cardiovascular Disease | Admitting: Cardiovascular Disease

## 2013-03-23 ENCOUNTER — Other Ambulatory Visit (HOSPITAL_COMMUNITY): Payer: Self-pay | Admitting: Nurse Practitioner

## 2013-03-23 ENCOUNTER — Encounter (HOSPITAL_COMMUNITY)
Admission: RE | Admit: 2013-03-23 | Discharge: 2013-03-23 | Disposition: A | Discharge status: Home / Self Care | Payer: BC Managed Care – PPO | Source: Ambulatory Visit | Attending: Cardiovascular Disease | Admitting: Cardiovascular Disease

## 2013-03-23 DIAGNOSIS — Z5189 Encounter for other specified aftercare: Secondary | ICD-10-CM | POA: Insufficient documentation

## 2013-03-23 DIAGNOSIS — I10 Essential (primary) hypertension: Secondary | ICD-10-CM | POA: Insufficient documentation

## 2013-03-23 DIAGNOSIS — I2510 Atherosclerotic heart disease of native coronary artery without angina pectoris: Secondary | ICD-10-CM | POA: Insufficient documentation

## 2013-03-23 DIAGNOSIS — Z9861 Coronary angioplasty status: Secondary | ICD-10-CM | POA: Insufficient documentation

## 2013-03-26 ENCOUNTER — Encounter (HOSPITAL_COMMUNITY)
Admission: RE | Admit: 2013-03-26 | Discharge: 2013-03-26 | Disposition: A | Discharge status: Home / Self Care | Payer: BC Managed Care – PPO | Source: Ambulatory Visit | Attending: Cardiovascular Disease | Admitting: Cardiovascular Disease

## 2013-03-28 ENCOUNTER — Encounter (HOSPITAL_COMMUNITY): Payer: BC Managed Care – PPO

## 2013-03-30 ENCOUNTER — Encounter (HOSPITAL_COMMUNITY)
Admission: RE | Admit: 2013-03-30 | Discharge: 2013-03-30 | Disposition: A | Discharge status: Home / Self Care | Payer: BC Managed Care – PPO | Source: Ambulatory Visit | Attending: Cardiovascular Disease | Admitting: Cardiovascular Disease

## 2013-04-02 ENCOUNTER — Encounter (HOSPITAL_COMMUNITY)
Admission: RE | Admit: 2013-04-02 | Discharge: 2013-04-02 | Disposition: A | Discharge status: Home / Self Care | Payer: BC Managed Care – PPO | Source: Ambulatory Visit | Attending: Cardiovascular Disease | Admitting: Cardiovascular Disease

## 2013-04-04 ENCOUNTER — Encounter (HOSPITAL_COMMUNITY)
Admission: RE | Admit: 2013-04-04 | Discharge: 2013-04-04 | Disposition: A | Discharge status: Home / Self Care | Payer: BC Managed Care – PPO | Source: Ambulatory Visit | Attending: Cardiovascular Disease | Admitting: Cardiovascular Disease

## 2013-04-06 ENCOUNTER — Encounter (HOSPITAL_COMMUNITY)
Admission: RE | Admit: 2013-04-06 | Discharge: 2013-04-06 | Disposition: A | Discharge status: Home / Self Care | Payer: BC Managed Care – PPO | Source: Ambulatory Visit | Attending: Cardiovascular Disease | Admitting: Cardiovascular Disease

## 2013-04-09 ENCOUNTER — Encounter (HOSPITAL_COMMUNITY)
Admission: RE | Admit: 2013-04-09 | Discharge: 2013-04-09 | Disposition: A | Discharge status: Home / Self Care | Payer: BC Managed Care – PPO | Source: Ambulatory Visit | Attending: Cardiovascular Disease | Admitting: Cardiovascular Disease

## 2013-04-11 ENCOUNTER — Encounter (HOSPITAL_COMMUNITY)
Admission: RE | Admit: 2013-04-11 | Discharge: 2013-04-11 | Disposition: A | Discharge status: Home / Self Care | Payer: BC Managed Care – PPO | Source: Ambulatory Visit | Attending: Cardiovascular Disease | Admitting: Cardiovascular Disease

## 2013-04-13 ENCOUNTER — Encounter (HOSPITAL_COMMUNITY)
Admission: RE | Admit: 2013-04-13 | Discharge: 2013-04-13 | Disposition: A | Discharge status: Home / Self Care | Payer: BC Managed Care – PPO | Source: Ambulatory Visit | Attending: Cardiovascular Disease | Admitting: Cardiovascular Disease

## 2013-04-16 ENCOUNTER — Encounter (HOSPITAL_COMMUNITY): Payer: BC Managed Care – PPO

## 2013-04-18 ENCOUNTER — Encounter (HOSPITAL_COMMUNITY)
Admission: RE | Admit: 2013-04-18 | Discharge: 2013-04-18 | Disposition: A | Discharge status: Home / Self Care | Payer: BC Managed Care – PPO | Source: Ambulatory Visit | Attending: Cardiovascular Disease | Admitting: Cardiovascular Disease

## 2013-04-20 ENCOUNTER — Encounter (HOSPITAL_COMMUNITY): Payer: BC Managed Care – PPO

## 2013-04-23 ENCOUNTER — Encounter (HOSPITAL_COMMUNITY)
Admission: RE | Admit: 2013-04-23 | Discharge: 2013-04-23 | Disposition: A | Discharge status: Home / Self Care | Payer: BC Managed Care – PPO | Source: Ambulatory Visit | Attending: Cardiovascular Disease | Admitting: Cardiovascular Disease

## 2013-04-23 DIAGNOSIS — I2510 Atherosclerotic heart disease of native coronary artery without angina pectoris: Secondary | ICD-10-CM | POA: Insufficient documentation

## 2013-04-23 DIAGNOSIS — Z9861 Coronary angioplasty status: Secondary | ICD-10-CM | POA: Insufficient documentation

## 2013-04-23 DIAGNOSIS — I10 Essential (primary) hypertension: Secondary | ICD-10-CM | POA: Insufficient documentation

## 2013-04-23 DIAGNOSIS — Z5189 Encounter for other specified aftercare: Secondary | ICD-10-CM | POA: Insufficient documentation

## 2013-04-25 ENCOUNTER — Ambulatory Visit (INDEPENDENT_AMBULATORY_CARE_PROVIDER_SITE_OTHER): Payer: BC Managed Care – PPO | Admitting: Cardiovascular Disease

## 2013-04-25 ENCOUNTER — Encounter (HOSPITAL_COMMUNITY)
Admission: RE | Admit: 2013-04-25 | Discharge: 2013-04-25 | Disposition: A | Discharge status: Home / Self Care | Payer: BC Managed Care – PPO | Source: Ambulatory Visit | Attending: Cardiovascular Disease | Admitting: Cardiovascular Disease

## 2013-04-25 ENCOUNTER — Encounter: Payer: Self-pay | Admitting: Cardiovascular Disease

## 2013-04-25 VITALS — BP 132/84 | HR 80 | Ht 69.0 in | Wt 247.8 lb

## 2013-04-25 DIAGNOSIS — I2510 Atherosclerotic heart disease of native coronary artery without angina pectoris: Secondary | ICD-10-CM

## 2013-04-25 MED ORDER — CLOPIDOGREL BISULFATE 75 MG PO TABS: 75.0000 mg | ORAL_TABLET | Freq: Every day | ORAL | Status: DC

## 2013-04-25 MED ORDER — AMLODIPINE BESYLATE 5 MG PO TABS: 5.0000 mg | ORAL_TABLET | Freq: Every day | ORAL | Status: DC

## 2013-04-25 MED ORDER — LISINOPRIL-HYDROCHLOROTHIAZIDE 20-12.5 MG PO TABS: 1.0000 | ORAL_TABLET | Freq: Every day | ORAL | Status: DC

## 2013-04-25 MED ORDER — ISOSORBIDE MONONITRATE ER 60 MG PO TB24: 90.0000 mg | ORAL_TABLET | Freq: Every day | ORAL | Status: DC

## 2013-04-25 MED ORDER — SIMVASTATIN 10 MG PO TABS: 10.0000 mg | ORAL_TABLET | Freq: Every day | ORAL | Status: DC

## 2013-04-25 MED ORDER — METOPROLOL TARTRATE 25 MG PO TABS: 25.0000 mg | ORAL_TABLET | Freq: Two times a day (BID) | ORAL | Status: DC

## 2013-04-25 NOTE — Progress Notes (Signed)
HPI:  62 year old gentleman returning for followup evaluation. He was hospitalized in March 2014 with progressive anginal symptoms and underwent cardiac catheterization. He had a known history of coronary artery disease with previous stenting of the right coronary artery. His most recent cardiac catheterization demonstrated severe LAD stenosis and he was treated with a drug-eluting stent in the mid LAD and balloon angioplasty of the diagonal. His left ventricular ejection fraction is preserved at 55-65%.  Overall he's done very well since PCI. His anginal symptoms have dramatically improved. He is able to work in his yard again. He denies dyspnea, orthopnea, or PND. He's been walking up to 30 minutes daily. He played golf recently, but felt like he "gave out" after the 12th hole. He was concerned last week when he developed right-sided chest discomfort and tenderness to palpation. This lasted for 2 days. There were no other associated symptoms. This was unlike his previous cardiac chest pain.  Outpatient Encounter Prescriptions as of 04/25/2013  Medication Sig Dispense Refill  . amLODipine (NORVASC) 5 MG tablet Take 1 tablet (5 mg total) by mouth daily.  30 tablet  6  . aspirin EC 81 MG EC tablet Take 1 tablet (81 mg total) by mouth daily.      . Cholecalciferol (VITAMIN D-3) 1000 UNITS CAPS Take 1,000 Units by mouth daily.      . clopidogrel (PLAVIX) 75 MG tablet Take 1 tablet (75 mg total) by mouth daily with breakfast.  90 tablet  3  . isosorbide mononitrate (IMDUR) 60 MG 24 hr tablet Take 90 mg by mouth daily.      . isosorbide mononitrate (IMDUR) 60 MG 24 hr tablet Take 1.5 tablets (90 mg total) by mouth daily.  45 tablet  3  . lisinopril-hydrochlorothiazide (PRINZIDE,ZESTORETIC) 20-12.5 MG per tablet Take 1 tablet by mouth Daily.      . metoprolol tartrate (LOPRESSOR) 25 MG tablet Take 1 tablet (25 mg total) by mouth 2 (two) times daily.  60 tablet  1  . nitroGLYCERIN (NITROSTAT) 0.4 MG SL  tablet Place 1 tablet (0.4 mg total) under the tongue every 5 (five) minutes x 3 doses as needed for chest pain.  25 tablet  3  . Omega-3 Fatty Acids (FISH OIL) 1200 MG CAPS Take 3 capsules by mouth daily.      . simvastatin (ZOCOR) 10 MG tablet Take 1 tablet (10 mg total) by mouth at bedtime.  90 tablet  3   No facility-administered encounter medications on file as of 04/25/2013.    No Known Allergies  Past Medical History  Diagnosis Date  . CAD (coronary artery disease)     a. 03/2012 Cath: LAD 80, RCA 80, nl EF;  b. PCI of RCA w 3.0x29mm Promus DES, FFR of LAD was nl @ 0.85 ->Med Rx.;  c. LHC 02/15/13: proximal LAD 40-50%, mid LAD 95%, small D1 95-99%, mid CFX 30-40%, proximal RCA stent patent with ostial 20-30% prior to the stent, ostial PDA 50%, EF 55-65%. PCI:  Promus DES to the mid LAD and POBA to the proximal D1   . Hypertension   . Arthritis   . Hyperlipidemia   . Obesity     ROS: Negative except as per HPI  BP 132/84  Pulse 80  Ht 5\' 9"  (1.753 m)  Wt 112.401 kg (247 lb 12.8 oz)  BMI 36.58 kg/m2  PHYSICAL EXAM: Pt is alert and oriented, overweight male in NAD HEENT: normal Neck: JVP - normal, carotids 2+= without bruits Lungs:  CTA bilaterally CV: RRR without murmur or gallop Abd: soft, NT, Positive BS, no hepatomegaly Ext: no C/C/E, distal pulses intact and equal Skin: warm/dry no rash  EKG:  Normal sinus rhythm 71 beats per minute, RSR prime in V1 suggests RV conduction delay.  ASSESSMENT AND PLAN: 1. Coronary artery disease, native vessel. The patient is stable without anginal symptoms. We checked an EKG today because of his atypical chest pain last week. This showed no ischemic changes. He will continue on his current medical program.  2. Essential hypertension. Blood pressure is controlled on his current medical program which includes amlodipine, isosorbide, lisinopril, hydrochlorothiazide, and metoprolol.  3. Hyperlipidemia. He is maintained on low-dose  simvastatin the setting of a calcium channel blocker. His primary care physician follows his lipids.  I would like to see him back in 6 months for followup. We discussed the importance of weight loss as it pertains to his cardiac condition. He will continue to work on this.  Tonny Bollman 04/25/2013 3:35 PM

## 2013-04-25 NOTE — Patient Instructions (Signed)
Your physician wants you to follow-up in: 6 MONTHS.  You will receive a reminder letter in the mail two months in advance. If you don't receive a letter, please call our office to schedule the follow-up appointment.  Your physician recommends that you continue on your current medications as directed. Please refer to the Current Medication list given to you today.  

## 2013-04-27 ENCOUNTER — Encounter (HOSPITAL_COMMUNITY)
Admission: RE | Admit: 2013-04-27 | Discharge: 2013-04-27 | Disposition: A | Discharge status: Home / Self Care | Payer: BC Managed Care – PPO | Source: Ambulatory Visit | Attending: Cardiovascular Disease | Admitting: Cardiovascular Disease

## 2013-04-30 ENCOUNTER — Encounter (HOSPITAL_COMMUNITY)
Admission: RE | Admit: 2013-04-30 | Discharge: 2013-04-30 | Disposition: A | Discharge status: Home / Self Care | Payer: BC Managed Care – PPO | Source: Ambulatory Visit | Attending: Cardiovascular Disease | Admitting: Cardiovascular Disease

## 2013-05-02 ENCOUNTER — Encounter (HOSPITAL_COMMUNITY)
Admission: RE | Admit: 2013-05-02 | Discharge: 2013-05-02 | Disposition: A | Discharge status: Home / Self Care | Payer: BC Managed Care – PPO | Source: Ambulatory Visit | Attending: Cardiovascular Disease | Admitting: Cardiovascular Disease

## 2013-05-04 ENCOUNTER — Encounter (HOSPITAL_COMMUNITY)
Admission: RE | Admit: 2013-05-04 | Discharge: 2013-05-04 | Disposition: A | Discharge status: Home / Self Care | Payer: BC Managed Care – PPO | Source: Ambulatory Visit | Attending: Cardiovascular Disease | Admitting: Cardiovascular Disease

## 2013-05-07 ENCOUNTER — Encounter (HOSPITAL_COMMUNITY): Payer: BC Managed Care – PPO

## 2013-05-09 ENCOUNTER — Encounter (HOSPITAL_COMMUNITY): Payer: BC Managed Care – PPO

## 2013-05-11 ENCOUNTER — Encounter (HOSPITAL_COMMUNITY): Payer: BC Managed Care – PPO

## 2013-05-14 ENCOUNTER — Encounter (HOSPITAL_COMMUNITY)
Admission: RE | Admit: 2013-05-14 | Discharge: 2013-05-14 | Disposition: A | Discharge status: Home / Self Care | Payer: BC Managed Care – PPO | Source: Ambulatory Visit | Attending: Cardiovascular Disease | Admitting: Cardiovascular Disease

## 2013-05-16 ENCOUNTER — Encounter (HOSPITAL_COMMUNITY)
Admission: RE | Admit: 2013-05-16 | Discharge: 2013-05-16 | Disposition: A | Discharge status: Home / Self Care | Payer: BC Managed Care – PPO | Source: Ambulatory Visit | Attending: Cardiovascular Disease | Admitting: Cardiovascular Disease

## 2013-05-18 ENCOUNTER — Encounter (HOSPITAL_COMMUNITY): Payer: BC Managed Care – PPO

## 2013-05-21 ENCOUNTER — Encounter (HOSPITAL_COMMUNITY)
Admission: RE | Admit: 2013-05-21 | Discharge: 2013-05-21 | Disposition: A | Discharge status: Home / Self Care | Payer: BC Managed Care – PPO | Source: Ambulatory Visit | Attending: Cardiovascular Disease | Admitting: Cardiovascular Disease

## 2013-05-23 ENCOUNTER — Encounter (HOSPITAL_COMMUNITY)
Admission: RE | Admit: 2013-05-23 | Discharge: 2013-05-23 | Disposition: A | Discharge status: Home / Self Care | Payer: BC Managed Care – PPO | Source: Ambulatory Visit | Attending: Cardiovascular Disease | Admitting: Cardiovascular Disease

## 2013-05-23 DIAGNOSIS — I2510 Atherosclerotic heart disease of native coronary artery without angina pectoris: Secondary | ICD-10-CM | POA: Insufficient documentation

## 2013-05-23 DIAGNOSIS — Z9861 Coronary angioplasty status: Secondary | ICD-10-CM | POA: Insufficient documentation

## 2013-05-23 DIAGNOSIS — I10 Essential (primary) hypertension: Secondary | ICD-10-CM | POA: Insufficient documentation

## 2013-05-23 DIAGNOSIS — Z5189 Encounter for other specified aftercare: Secondary | ICD-10-CM | POA: Insufficient documentation

## 2013-05-25 ENCOUNTER — Encounter (HOSPITAL_COMMUNITY): Payer: BC Managed Care – PPO

## 2013-05-28 ENCOUNTER — Encounter (HOSPITAL_COMMUNITY)
Admission: RE | Admit: 2013-05-28 | Discharge: 2013-05-28 | Disposition: A | Discharge status: Home / Self Care | Payer: BC Managed Care – PPO | Source: Ambulatory Visit | Attending: Cardiovascular Disease | Admitting: Cardiovascular Disease

## 2013-05-30 ENCOUNTER — Encounter (HOSPITAL_COMMUNITY)
Admission: RE | Admit: 2013-05-30 | Discharge: 2013-05-30 | Disposition: A | Discharge status: Home / Self Care | Payer: BC Managed Care – PPO | Source: Ambulatory Visit | Attending: Cardiovascular Disease | Admitting: Cardiovascular Disease

## 2013-06-01 ENCOUNTER — Encounter (HOSPITAL_COMMUNITY)
Admission: RE | Admit: 2013-06-01 | Discharge: 2013-06-01 | Disposition: A | Discharge status: Home / Self Care | Payer: BC Managed Care – PPO | Source: Ambulatory Visit | Attending: Cardiovascular Disease | Admitting: Cardiovascular Disease

## 2013-06-04 ENCOUNTER — Encounter (HOSPITAL_COMMUNITY)
Admission: RE | Admit: 2013-06-04 | Discharge: 2013-06-04 | Disposition: A | Discharge status: Home / Self Care | Payer: BC Managed Care – PPO | Source: Ambulatory Visit | Attending: Cardiovascular Disease | Admitting: Cardiovascular Disease

## 2013-06-06 ENCOUNTER — Encounter (HOSPITAL_COMMUNITY)
Admission: RE | Admit: 2013-06-06 | Discharge: 2013-06-06 | Disposition: A | Discharge status: Home / Self Care | Payer: BC Managed Care – PPO | Source: Ambulatory Visit | Attending: Cardiovascular Disease | Admitting: Cardiovascular Disease

## 2013-06-08 ENCOUNTER — Encounter (HOSPITAL_COMMUNITY)
Admission: RE | Admit: 2013-06-08 | Discharge: 2013-06-08 | Disposition: A | Discharge status: Home / Self Care | Payer: BC Managed Care – PPO | Source: Ambulatory Visit | Attending: Cardiovascular Disease | Admitting: Cardiovascular Disease

## 2013-06-11 ENCOUNTER — Encounter (HOSPITAL_COMMUNITY)
Admission: RE | Admit: 2013-06-11 | Discharge: 2013-06-11 | Disposition: A | Discharge status: Home / Self Care | Payer: BC Managed Care – PPO | Source: Ambulatory Visit

## 2013-06-13 ENCOUNTER — Encounter (HOSPITAL_COMMUNITY)
Admission: RE | Admit: 2013-06-13 | Discharge: 2013-06-13 | Disposition: A | Discharge status: Home / Self Care | Payer: BC Managed Care – PPO | Source: Ambulatory Visit | Attending: Cardiovascular Disease | Admitting: Cardiovascular Disease

## 2013-06-15 ENCOUNTER — Encounter (HOSPITAL_COMMUNITY)
Admission: RE | Admit: 2013-06-15 | Discharge: 2013-06-15 | Disposition: A | Discharge status: Home / Self Care | Payer: BC Managed Care – PPO | Source: Ambulatory Visit | Attending: Cardiovascular Disease | Admitting: Cardiovascular Disease

## 2013-06-18 ENCOUNTER — Encounter (HOSPITAL_COMMUNITY)
Admission: RE | Admit: 2013-06-18 | Discharge: 2013-06-18 | Disposition: A | Discharge status: Home / Self Care | Payer: BC Managed Care – PPO | Source: Ambulatory Visit | Attending: Cardiovascular Disease | Admitting: Cardiovascular Disease

## 2013-06-20 ENCOUNTER — Encounter (HOSPITAL_COMMUNITY)
Admission: RE | Admit: 2013-06-20 | Discharge: 2013-06-20 | Disposition: A | Discharge status: Home / Self Care | Payer: BC Managed Care – PPO | Source: Ambulatory Visit | Attending: Cardiovascular Disease | Admitting: Cardiovascular Disease

## 2013-06-22 ENCOUNTER — Encounter (HOSPITAL_COMMUNITY): Payer: BC Managed Care – PPO

## 2013-06-25 ENCOUNTER — Encounter (HOSPITAL_COMMUNITY)
Admission: RE | Admit: 2013-06-25 | Discharge: 2013-06-25 | Disposition: A | Discharge status: Home / Self Care | Payer: BC Managed Care – PPO | Source: Ambulatory Visit | Attending: Cardiovascular Disease | Admitting: Cardiovascular Disease

## 2013-06-25 DIAGNOSIS — Z9861 Coronary angioplasty status: Secondary | ICD-10-CM | POA: Insufficient documentation

## 2013-06-25 DIAGNOSIS — I10 Essential (primary) hypertension: Secondary | ICD-10-CM | POA: Insufficient documentation

## 2013-06-25 DIAGNOSIS — I2510 Atherosclerotic heart disease of native coronary artery without angina pectoris: Secondary | ICD-10-CM | POA: Insufficient documentation

## 2013-06-25 DIAGNOSIS — Z5189 Encounter for other specified aftercare: Secondary | ICD-10-CM | POA: Insufficient documentation

## 2013-06-27 ENCOUNTER — Encounter (HOSPITAL_COMMUNITY)
Admission: RE | Admit: 2013-06-27 | Discharge: 2013-06-27 | Disposition: A | Discharge status: Home / Self Care | Payer: BC Managed Care – PPO | Source: Ambulatory Visit | Attending: Cardiovascular Disease | Admitting: Cardiovascular Disease

## 2013-06-29 ENCOUNTER — Encounter (HOSPITAL_COMMUNITY)
Admission: RE | Admit: 2013-06-29 | Discharge: 2013-06-29 | Disposition: A | Discharge status: Home / Self Care | Payer: BC Managed Care – PPO | Source: Ambulatory Visit | Attending: Cardiovascular Disease | Admitting: Cardiovascular Disease

## 2013-07-02 ENCOUNTER — Encounter (HOSPITAL_COMMUNITY): Payer: BC Managed Care – PPO

## 2013-07-13 NOTE — Progress Notes (Signed)
Cardiac Rehabilitation Program Outcomes Report   Orientation:  03/15/2013 Graduate Date:  tbd Discharge Date:  tbd # of sessions completed: 18 DX:: Stent x1 and Balloon Angioplasty  Cardiologist: Excell Seltzer Family MD:  Leandrew Koyanagi Class Time:  09:30  A.  Exercise Program:  Tolerates exercise @ 2.89 METS for 15 minutes and Exercise limited by musculoskeletal problems  B.  Mental Health:  Good mental attitude  C.  Education/Instruction/Skills  Accurately checks own pulse.  Rest:  67  Exercise:  97, Knows THR for exercise and Uses Perceived Exertion Scale and/or Dyspnea Scale  Uses Perceived Exertion Scale and/or Dyspnea Scale  D.  Nutrition/Weight Control/Body Composition:  Adherence to prescribed nutrition program: good    E.  Blood Lipids    No results found for this basename: CHOL, HDL, LDLCALC, LDLDIRECT, TRIG, CHOLHDL    F.  Lifestyle Changes:  Making positive lifestyle changes and Not smoking:  Quit Never Smoked  G.  Symptoms noted with exercise:  Asymptomatic  Report Completed By:  Lelon Huh. Metzli Pollick RN   Comments:  This is patients halfway report. He has done well his Knee has bothered him so he changes equipment often. His resting HR was 67 and his resting BP was 100/52, His peak HR was 97 and Peak BP was 142/62. A report will follow upon Graduation on his 36th visit.

## 2013-07-13 NOTE — Progress Notes (Signed)
Cardiac Rehabilitation Program Outcomes Report   Orientation:  03/15/2013 Graduate Date:  tbd Discharge Date:  tbd # of sessions completed: 3 DX:  Stent X1 and Balloon Angioplasty  Cardiologist: Clabe Seal MD:  Leandrew Koyanagi Class Time:  09:30  A.  Exercise Program:  Tolerates exercise @ 3.89 METS for 15 minutes, Walk Test Results:  Pre: Pre walk test: Resting HR 51, BP98/58, O2 98%, RPE8 and RPD 6, 6 minute HR 71, BP98/58, O2 98, RPE 11 and RPD12, Post HR 51, BP 98/60, O2 96, RPE8 and RPD 6. Walked 1200 Ft at 2.3 MPH and 2.7 METS., No Change  muscular strength  90 %, No Change  flexibility 0 % and No Change dyspnea score 0 %  B.  Mental Health:  Good mental attitude and Quality of Life (QOL)  changes:  Overall  28.52 %, Health/Functioning 27.89 %, Socioeconomics 28.93 %, Psych/Spiritual 29.14 %, Family 28.80 %    C.  Education/Instruction/Skills  Accurately checks own pulse.  Rest:  62  Exercise: 98, Knows THR for exercise and Uses Perceived Exertion Scale and/or Dyspnea Scale  Uses Perceived Exertion Scale and/or Dyspnea Scale  D.  Nutrition/Weight Control/Body Composition:  Adherence to prescribed nutrition program: good , % Body Fat  32.9 and Weight loss not indicated   E.  Blood Lipids    No results found for this basename: CHOL, HDL, LDLCALC, LDLDIRECT, TRIG, CHOLHDL    F.  Lifestyle Changes:  Making positive lifestyle changes and Not smoking:  Quit Never smoked  G.  Symptoms noted with exercise:  Asymptomatic  Report Completed By:  Lelon Huh. Marriah Sanderlin RN   Comments:  This is patients 1st week report. He has done well. He achieved a peak METS of 3.89. His resting HR was 62 and resting BP was 120/70, His peak HR was 98 and peak BP was 120/80. A halfway report will follow upon his 18th visit.

## 2013-10-30 NOTE — Progress Notes (Signed)
Cardiac Rehabilitation Program Outcomes Report   Orientation:  03/15/2013 Graduate Date:  06/29/2013 Discharge Date:  06/29/2013 # of sessions completed: 36 DX: Stent X 1 and Balloon Angioplasty  Cardiologist: Excell Seltzer Family MD:  Leandrew Koyanagi Class Time:  09:30  A.  Exercise Program:  Tolerates exercise @ 3.90 METS for 15 minutes and Discharged to home exercise program.  Anticipated compliance:  excellent  B.  Mental Health:  Good mental attitude  C.  Education/Instruction/Skills  Accurately checks own pulse.  Rest:  64  Exercise:  87, Knows THR for exercise, Uses Perceived Exertion Scale and/or Dyspnea Scale and Attended 12 education classes  Uses Perceived Exertion Scale and/or Dyspnea Scale  D.  Nutrition/Weight Control/Body Composition:  Adherence to prescribed nutrition program: good    E.  Blood Lipids    No results found for this basename: CHOL, HDL, LDLCALC, LDLDIRECT, TRIG, CHOLHDL    F.  Lifestyle Changes:  Making positive lifestyle changes and Not smoking:  Quit Never smoked  G.  Symptoms noted with exercise:  Asymptomatic  Report Completed By:  Lelon Huh. Britne Borelli RN   Comments:  This is patients graduation report. He achieved a peak METS of 3.90. His resting HR was 64 and resting BP was 120/52, His peak HR was 87 and peak Bp was 120/70. He plans to continue exercising by walking outside as weather permits. He is very motivated to keep this up. We will call patient at 1 month , 6 months and 1 year to see if he has been compliant.

## 2013-10-30 NOTE — Addendum Note (Signed)
Encounter addended by: Angelica Pou, RN on: 10/30/2013  8:03 AM<BR>     Documentation filed: Notes Section

## 2013-10-30 NOTE — Addendum Note (Signed)
Encounter addended by: Angelica Pou, RN on: 10/30/2013  8:08 AM<BR>     Documentation filed: Clinical Notes

## 2013-12-25 ENCOUNTER — Other Ambulatory Visit (HOSPITAL_COMMUNITY): Payer: Self-pay | Admitting: Cardiovascular Disease

## 2014-02-18 ENCOUNTER — Other Ambulatory Visit: Payer: Self-pay | Admitting: Cardiovascular Disease

## 2014-04-03 ENCOUNTER — Other Ambulatory Visit: Payer: Self-pay | Admitting: Cardiovascular Disease

## 2014-05-11 ENCOUNTER — Other Ambulatory Visit: Payer: Self-pay | Admitting: Cardiovascular Disease

## 2014-05-13 ENCOUNTER — Other Ambulatory Visit: Payer: Self-pay | Admitting: Cardiovascular Disease

## 2014-05-31 ENCOUNTER — Other Ambulatory Visit: Payer: Self-pay | Admitting: Cardiovascular Disease

## 2014-06-02 ENCOUNTER — Other Ambulatory Visit: Payer: Self-pay | Admitting: Cardiovascular Disease

## 2014-10-31 ENCOUNTER — Encounter (HOSPITAL_COMMUNITY): Payer: Self-pay | Admitting: Cardiology

## 2014-12-05 ENCOUNTER — Encounter (HOSPITAL_COMMUNITY): Payer: Self-pay | Admitting: Cardiovascular Disease

## 2015-02-04 ENCOUNTER — Other Ambulatory Visit (HOSPITAL_COMMUNITY): Payer: Self-pay | Admitting: Cardiovascular Disease

## 2015-02-05 NOTE — Telephone Encounter (Signed)
This pt has not been seen in our office since 2014.  The pt needs to arrange cardiology follow-up. You can authorize 1 refill per Dr Burt Knack. If the pt is being managed by PCP then future refills need to come from that doctor.

## 2015-02-19 ENCOUNTER — Encounter: Payer: Self-pay | Admitting: Cardiovascular Disease

## 2015-05-06 ENCOUNTER — Encounter: Payer: Self-pay | Admitting: Cardiovascular Disease

## 2015-05-06 ENCOUNTER — Ambulatory Visit (INDEPENDENT_AMBULATORY_CARE_PROVIDER_SITE_OTHER): Payer: PPO | Admitting: Cardiovascular Disease

## 2015-05-06 VITALS — BP 112/80 | HR 75 | Ht 69.0 in | Wt 240.4 lb

## 2015-05-06 DIAGNOSIS — I2510 Atherosclerotic heart disease of native coronary artery without angina pectoris: Secondary | ICD-10-CM

## 2015-05-06 DIAGNOSIS — I10 Essential (primary) hypertension: Secondary | ICD-10-CM | POA: Diagnosis not present

## 2015-05-06 NOTE — Progress Notes (Signed)
Cardiology Office Note Date:  05/06/2015   ID:  Aidan Moten, DOB 05-30-1951, MRN [id number]  PCP:  Curlene Labrum, MD  Cardiologist:  Sherren Mocha, MD    Chief Complaint  Patient presents with  . Fatigue    History of Present Illness: Kyle Lambert is a 64 y.o. male who presents for follow-up evaluation. He's followed for coronary artery disease. He was hospitalized in 2014 with progressive anginal symptoms and underwent cardiac catheterization. He had a known history of coronary artery disease with previous stenting of the right coronary artery. His most recent cardiac catheterization demonstrated severe LAD stenosis and he was treated with a drug-eluting stent in the mid LAD and balloon angioplasty of the diagonal. His left ventricular ejection fraction is preserved at 55-65%.  He complains of periodically feeling 'give out.' He complains of generalized weakness associated with physical work, especially in the heat. He had a single episode of left arm tingling and neck discomfort, but reports this was transient without recurrence. No chest pain or pressure. No edema, orthopnea, or PND. No palpitations, lightheadedness, or syncope. He does admit to DOE but no major limitation and no progression of symptoms.  The patient tries to walk for exercise as much is possible. His weight is down about 10 pounds from his previous visits 2 years ago.   Past Medical History  Diagnosis Date  . CAD (coronary artery disease)     a. 03/2012 Cath: LAD 80, RCA 80, nl EF;  b. PCI of RCA w 3.0x21mm Promus DES, FFR of LAD was nl @ 0.85 ->Med Rx.;  c. LHC 02/15/13: proximal LAD 40-50%, mid LAD 95%, small D1 95-99%, mid CFX 30-40%, proximal RCA stent patent with ostial 20-30% prior to the stent, ostial PDA 50%, EF 55-65%. PCI:  Promus DES to the mid LAD and POBA to the proximal D1   . Hypertension   . Arthritis   . Hyperlipidemia   . Obesity     Past Surgical History  Procedure Laterality Date  .  Lithotripsy    . Coronary angioplasty with stent placement  04/24/12    DES-RCA  . Hernia repair Left inguinal  . Coronary angioplasty with stent placement  02/15/13    Patent RCA stent, diffuse nonobstructive R PDA, LCx disease, 99% mid LAD stenosis s/p DES-mid LAD; LVEF 60%  . Coronary stent placement    . Left heart catheterization with coronary angiogram N/A 04/21/2012    Procedure: LEFT HEART CATHETERIZATION WITH CORONARY ANGIOGRAM;  Surgeon: Hillary Bow, MD;  Location: [facility] CATH LAB;  Service: Cardiovascular;  Laterality: N/A;  . Percutaneous coronary stent intervention (pci-s) N/A 04/24/2012    Procedure: PERCUTANEOUS CORONARY STENT INTERVENTION (PCI-S);  Surgeon: Sherren Mocha, MD;  Location: [facility] CATH LAB;  Service: Cardiovascular;  Laterality: N/A;  . Left heart catheterization with coronary angiogram N/A 02/15/2013    Procedure: LEFT HEART CATHETERIZATION WITH CORONARY ANGIOGRAM;  Surgeon: Sherren Mocha, MD;  Location: [facility] CATH LAB;  Service: Cardiovascular;  Laterality: N/A;  . Percutaneous coronary stent intervention (pci-s)  02/15/2013    Procedure: PERCUTANEOUS CORONARY STENT INTERVENTION (PCI-S);  Surgeon: Sherren Mocha, MD;  Location: [facility] CATH LAB;  Service: Cardiovascular;;    Current Outpatient Prescriptions  Medication Sig Dispense Refill  . amLODipine (NORVASC) 5 MG tablet TAKE 1 TABLET DAILY 90 tablet 0  . clopidogrel (PLAVIX) 75 MG tablet TAKE 1 TABLET (75 MG TOTAL) DAILY WITH BREAKFAST. 90 tablet 0  . isosorbide mononitrate (IMDUR) 60 MG 24 hr tablet Take  1.5 tablets (90 mg total) by mouth daily. 135 tablet 3  . lisinopril-hydrochlorothiazide (PRINZIDE,ZESTORETIC) 20-12.5 MG per tablet TAKE 1 TABLET EVERY DAY 30 tablet 0  . metoprolol tartrate (LOPRESSOR) 25 MG tablet TAKE 1 TABLET TWICE A DAY 60 tablet 0  . nitroGLYCERIN (NITROSTAT) 0.4 MG SL tablet Place 1 tablet (0.4 mg total) under the tongue every 5 (five) minutes x 3 doses as needed for chest pain. 25 tablet 3  .  simvastatin (ZOCOR) 10 MG tablet TAKE 1 TABLET AT BEDTIME 30 tablet 0   No current facility-administered medications for this visit.    Allergies:   Review of patient's allergies indicates no known allergies.   Social History:  The patient  reports that he has never smoked. He has never used smokeless tobacco. He reports that he does not drink alcohol or use illicit drugs.   Family History:  The patient's  family history is not on file.    ROS:  Please see the history of present illness.  Otherwise, review of systems is positive for depression, anxiety, snoring, constipation, nausea, back pain, muscle pain, and headaches.  All other systems are reviewed and negative.    PHYSICAL EXAM: VS:  BP 112/80 mmHg  Pulse 75  Ht 5\' 9"  (1.753 m)  Wt 240 lb 6.4 oz (109.045 kg)  BMI 35.48 kg/m2 , BMI Body mass index is 35.48 kg/(m^2). GEN: Well nourished, well developed, pleasant overweight male in no acute distress HEENT: normal Neck: no JVD, no masses. No carotid bruits Cardiac: RRR without murmur or gallop                Respiratory:  clear to auscultation bilaterally, normal work of breathing GI: soft, nontender, nondistended, + BS MS: no deformity or atrophy Ext: no pretibial edema, pedal pulses 2+= bilaterally Skin: warm and dry, no rash Neuro:  Strength and sensation are intact Psych: euthymic mood, full affect  EKG:  EKG is ordered today. The ekg ordered today shows normal sinus rhythm 75 bpm, QR pattern in V1 suggests RV conduction delay  Recent Labs: No results found for requested labs within last 365 days.   Lipid Panel  No results found for: CHOL, TRIG, HDL, CHOLHDL, VLDL, LDLCALC, LDLDIRECT    Wt Readings from Last 3 Encounters:  05/06/15 240 lb 6.4 oz (109.045 kg)  04/25/13 247 lb 12.8 oz (112.401 kg)  03/15/13 252 lb 4.8 oz (114.443 kg)     Cardiac Studies Reviewed: Cardiac Catheterization 02/15/2013: PROCEDURAL FINDINGS Hemodynamics: AO 88/52 LV  89/7  Coronary angiography: Coronary dominance: right  Left mainstem: The left mainstem is short. There is no significant stenosis present.  Left anterior descending (LAD): The LAD is patent. The vessel has moderate proximal calcification. The mid vessel has a 95% stenosis. This is a focal napkin ring type stenosis. The distal vessel is diffusely diseased with severe stenosis near the apex. The proximal LAD has 40-50% stenosis. There is a small first diagonal branch with 95-99% stenosis.  Left circumflex (LCx): The left circumflex is patent. The vessel supplies 2 major OM branches. The first OM gives off multiple subbranches. The proximal circumflex has nonobstructive stenosis. The mid circumflex as it enters the OM has diffuse 30-40% stenosis  Right coronary artery (RCA): The RCA is a large, dominant vessel. The stented segment in the proximal vessel is widely patent. There is minor ostial stenosis before the stent. This is no more than 20-30% stenosis. The mid and distal vessel are patent. The origin of  the PDA branch has 50% stenosis. This is unchanged from the previous study.  Left ventriculography: Left ventricular systolic function is normal, LVEF is estimated at 55-65%, there is no significant mitral regurgitation   PCI Note: Following the diagnostic procedure, the decision was made to proceed with PCI. The patient has progressive, severe stenosis in the mid LAD. This lesion is focal and I thought it was amenable to PCI. There is also high-grade diagonal stenosis involving a small vessel. I plan on approaching this with plain balloon angioplasty. The radial sheath was upsized to a 6 [place]. Weight-based unfractionated heparin was given for anticoagulation. The patient has been maintained on long-term aspirin and Plavix. Once a therapeutic ACT was achieved, a 6 [place] XB LAD guide catheter was inserted. A pro-water coronary guidewire was used to cross the lesion in the mid LAD. The  lesion was predilated with a 2.0 mm balloon. The lesion was then stented with a 2.25 x 12 mm Promus premier drug-eluting stent. The stent was postdilated with a 2.25 mm noncompliant balloon. Following PCI, there was 0% residual stenosis and TIMI-3 flow. Attention was then turned to the first diagonal branch. I initially tried to wire the vessel with a prolonged her wire. This was unsuccessful. A whisper wire was utilized successfully. The vessel was dilated with a 1.5 x 10 mm balloon to 10 atmospheres for 60 seconds and again to 12 atmospheres for about 45 seconds. This improved the percent stenosis from 99 down to 40% post PTCA. I felt this was the best we could do in a small vessel. The vessel was not amenable to stenting. Final angiography confirmed an excellent result. The patient tolerated the procedure well. There were no immediate procedural complications. A TR band was used for radial hemostasis. The patient was transferred to the post catheterization recovery area for further monitoring.  PCI Data: Lesion 1: Vessel - LAD/Segment - mid Percent Stenosis (pre) 90 TIMI-flow 3 Stent 2.25 x 12 mm drug-eluting Percent Stenosis (post) 0 TIMI-flow (post) 3  Lesion 2: Vessel - diagonal 1/Segment - proximal Percent Stenosis (pre) 99 TIMI-flow 3 Stent none Percent Stenosis (post) 40 TIMI-flow (post) 3  Final Conclusions:  1. Continued patency of the stented segment in the right coronary artery with nonobstructive stenosis in the right PDA 2. Diffuse nonobstructive left circumflex stenosis 3. Severe mid LAD stenosis with successful PCI and severe diagonal stenosis treated successfully with balloon angioplasty 4. Preserved left ventricular systolic function with an estimated left ventricular ejection fraction of 60%   Recommendations:  Continue dual antiplatelet therapy and medical treatment for CAD.  ASSESSMENT AND PLAN: 1.  CAD, native vessel: the patient appears stable with the  exception of one transient episode of possible angina with tingling in his left arm and neck. He's had no other symptoms. He thinks his fatigue is just related to aging and he is no longer able to go out in the middle of summer and do yard work for an extended period of time. I think we can continue his current medical program. I asked him to notify me if he has recurrence of symptoms or progressive exercise intolerance and we would consider stress testing if that were to occur. He was counseled about lifestyle modification.  2. Essential hypertension: blood pressure is well controlled on combination of isosorbide, lisinopril, and hydrochlorothiazide.  3. Hyperlipidemia:the patient is treated with simvastatin. He is followed by his primary care physician.  Current medicines are reviewed with the patient today.  The patient  does not have concerns regarding medicines.  Labs/ tests ordered today include:  No orders of the defined types were placed in this encounter.   Disposition:   FU one year unless problems arise  Signed, Sherren Mocha, MD  05/06/2015 10:06 AM    [facility] [address], [place], [place]  [address] Phone: [phone]; Fax: [phone]

## 2015-05-06 NOTE — Patient Instructions (Signed)

## 2016-01-02 DIAGNOSIS — M1991 Primary osteoarthritis, unspecified site: Secondary | ICD-10-CM | POA: Diagnosis not present

## 2016-01-02 DIAGNOSIS — I10 Essential (primary) hypertension: Secondary | ICD-10-CM | POA: Diagnosis not present

## 2016-01-02 DIAGNOSIS — I2510 Atherosclerotic heart disease of native coronary artery without angina pectoris: Secondary | ICD-10-CM | POA: Diagnosis not present

## 2016-01-02 DIAGNOSIS — E782 Mixed hyperlipidemia: Secondary | ICD-10-CM | POA: Diagnosis not present

## 2016-01-07 DIAGNOSIS — N3941 Urge incontinence: Secondary | ICD-10-CM | POA: Diagnosis not present

## 2016-01-07 DIAGNOSIS — E782 Mixed hyperlipidemia: Secondary | ICD-10-CM | POA: Diagnosis not present

## 2016-01-07 DIAGNOSIS — L570 Actinic keratosis: Secondary | ICD-10-CM | POA: Diagnosis not present

## 2016-01-07 DIAGNOSIS — M62830 Muscle spasm of back: Secondary | ICD-10-CM | POA: Diagnosis not present

## 2016-01-07 DIAGNOSIS — N200 Calculus of kidney: Secondary | ICD-10-CM | POA: Diagnosis not present

## 2016-01-07 DIAGNOSIS — I10 Essential (primary) hypertension: Secondary | ICD-10-CM | POA: Diagnosis not present

## 2016-01-07 DIAGNOSIS — I2510 Atherosclerotic heart disease of native coronary artery without angina pectoris: Secondary | ICD-10-CM | POA: Diagnosis not present

## 2016-01-07 DIAGNOSIS — Z0001 Encounter for general adult medical examination with abnormal findings: Secondary | ICD-10-CM | POA: Diagnosis not present

## 2016-01-07 DIAGNOSIS — I209 Angina pectoris, unspecified: Secondary | ICD-10-CM | POA: Diagnosis not present

## 2016-01-07 DIAGNOSIS — M546 Pain in thoracic spine: Secondary | ICD-10-CM | POA: Diagnosis not present

## 2016-01-07 DIAGNOSIS — N401 Enlarged prostate with lower urinary tract symptoms: Secondary | ICD-10-CM | POA: Diagnosis not present

## 2016-04-18 ENCOUNTER — Encounter (HOSPITAL_COMMUNITY): Payer: Self-pay | Admitting: *Deleted

## 2016-04-18 ENCOUNTER — Emergency Department (HOSPITAL_COMMUNITY)
Admission: EM | Admit: 2016-04-18 | Discharge: 2016-04-19 | Disposition: A | Discharge status: Home / Self Care | Payer: PPO | Attending: Emergency Medicine | Admitting: Emergency Medicine

## 2016-04-18 DIAGNOSIS — S50862A Insect bite (nonvenomous) of left forearm, initial encounter: Secondary | ICD-10-CM | POA: Diagnosis not present

## 2016-04-18 DIAGNOSIS — M1990 Unspecified osteoarthritis, unspecified site: Secondary | ICD-10-CM | POA: Insufficient documentation

## 2016-04-18 DIAGNOSIS — Z7982 Long term (current) use of aspirin: Secondary | ICD-10-CM | POA: Insufficient documentation

## 2016-04-18 DIAGNOSIS — Z6835 Body mass index (BMI) 35.0-35.9, adult: Secondary | ICD-10-CM | POA: Diagnosis not present

## 2016-04-18 DIAGNOSIS — X58XXXA Exposure to other specified factors, initial encounter: Secondary | ICD-10-CM | POA: Diagnosis not present

## 2016-04-18 DIAGNOSIS — Z79899 Other long term (current) drug therapy: Secondary | ICD-10-CM | POA: Insufficient documentation

## 2016-04-18 DIAGNOSIS — Y999 Unspecified external cause status: Secondary | ICD-10-CM | POA: Diagnosis not present

## 2016-04-18 DIAGNOSIS — I2510 Atherosclerotic heart disease of native coronary artery without angina pectoris: Secondary | ICD-10-CM | POA: Diagnosis not present

## 2016-04-18 DIAGNOSIS — Y939 Activity, unspecified: Secondary | ICD-10-CM | POA: Diagnosis not present

## 2016-04-18 DIAGNOSIS — L03114 Cellulitis of left upper limb: Secondary | ICD-10-CM | POA: Diagnosis not present

## 2016-04-18 DIAGNOSIS — E669 Obesity, unspecified: Secondary | ICD-10-CM | POA: Insufficient documentation

## 2016-04-18 DIAGNOSIS — I10 Essential (primary) hypertension: Secondary | ICD-10-CM | POA: Insufficient documentation

## 2016-04-18 DIAGNOSIS — Y929 Unspecified place or not applicable: Secondary | ICD-10-CM | POA: Diagnosis not present

## 2016-04-18 DIAGNOSIS — E785 Hyperlipidemia, unspecified: Secondary | ICD-10-CM | POA: Insufficient documentation

## 2016-04-18 DIAGNOSIS — L03116 Cellulitis of left lower limb: Secondary | ICD-10-CM | POA: Diagnosis not present

## 2016-04-18 MED ORDER — VANCOMYCIN HCL IN DEXTROSE 1-5 GM/200ML-% IV SOLN
1000.0000 mg | Freq: Once | INTRAVENOUS | Status: AC
  Administered 2016-04-18: 1000 mg via INTRAVENOUS
  Filled 2016-04-18: qty 200

## 2016-04-18 NOTE — ED Provider Notes (Signed)
Medical screening examination/treatment/procedure(s) were conducted as a shared visit with non-physician practitioner(s) and myself.  I personally evaluated the patient during the encounter.   EKG Interpretation None      Results for orders placed or performed during the hospital encounter of 02/15/13  CBC  Result Value Ref Range   WBC 7.6 4.0 - 10.5 K/uL   RBC 4.80 4.22 - 5.81 MIL/uL   Hemoglobin 13.6 13.0 - 17.0 g/dL   HCT 39.4 39.0 - 52.0 %   MCV 82.1 78.0 - 100.0 fL   MCH 28.3 26.0 - 34.0 pg   MCHC 34.5 30.0 - 36.0 g/dL   RDW 13.7 11.5 - 15.5 %   Platelets 236 150 - 400 K/uL  Basic metabolic panel  Result Value Ref Range   Sodium 135 135 - 145 mEq/L   Potassium 4.0 3.5 - 5.1 mEq/L   Chloride 98 96 - 112 mEq/L   CO2 26 19 - 32 mEq/L   Glucose, Bld 105 (H) 70 - 99 mg/dL   BUN 12 6 - 23 mg/dL   Creatinine, Ser 0.73 0.50 - 1.35 mg/dL   Calcium 9.1 8.4 - 10.5 mg/dL   GFR calc non Af Amer >90 >90 mL/min   GFR calc Af Amer >90 >90 mL/min  POCT Activated clotting time  Result Value Ref Range   Activated Clotting Time 241 seconds  POCT Activated clotting time  Result Value Ref Range   Activated Clotting Time 328 seconds   Patient seen by me with the physician assistant. Patient status post either bug bite or injury while weed eating to the left forearm on Friday. Patient now with an area of erythema measuring about 15 cm area of induration without fluctuance measuring 4 cm on the left forearm. No fevers. No complicating factors. Radial pulses 2+ sensation to fingers is intact good range of motion of fingers. Consistent with a cellulitis possible bug bite or possible just wound from the weed eater. Patient will receive a dose of vancomycin here and then be discharged home on antibiotics.  Fredia Sorrow, MD 04/18/16 2350

## 2016-04-18 NOTE — ED Notes (Signed)
Pt c/o insect bite to left forearm area Friday, area is redness, painful to touch,

## 2016-04-18 NOTE — ED Notes (Signed)
Out of bed to bathroom 

## 2016-04-18 NOTE — ED Notes (Signed)
Noticed a black spot on his arm yesterday after weed eating and scratched until it began draining. Today redness around the arm.

## 2016-04-19 MED ORDER — SULFAMETHOXAZOLE-TRIMETHOPRIM 800-160 MG PO TABS: 1.0000 | ORAL_TABLET | Freq: Two times a day (BID) | ORAL | Status: AC

## 2016-04-19 NOTE — ED Notes (Signed)
Pt alert & oriented x4, stable gait. Patient given discharge instructions, paperwork & prescription(s). Patient  instructed to stop at the registration desk to finish any additional paperwork. Patient verbalized understanding. Pt left department w/ no further questions. 

## 2016-04-19 NOTE — ED Provider Notes (Signed)
CSN: [id number]     Arrival date & time 04/18/16  2029 History   First MD Initiated Contact with Patient 04/18/16 2206     Chief Complaint  Patient presents with  . Insect Bite     (Consider location/radiation/quality/duration/timing/severity/associated sxs/prior Treatment) HPI   Kyle Lambert is a 65 y.o. male who presents to the Emergency Department complaining of increasing redness and black "spot" to his left forearm for one day.  He states that he was weed eating and noticed the discolored area and he scratched it and woke the next day to surrounding redness which has increased throughout the day.  He also complains of pain to the area and skin feels warm to touch.  He denies pain to the wrist, elbow, swelling of the arm, fever, chills.     Past Medical History  Diagnosis Date  . CAD (coronary artery disease)     a. 03/2012 Cath: LAD 80, RCA 80, nl EF;  b. PCI of RCA w 3.0x6mm Promus DES, FFR of LAD was nl @ 0.85 ->Med Rx.;  c. LHC 02/15/13: proximal LAD 40-50%, mid LAD 95%, small D1 95-99%, mid CFX 30-40%, proximal RCA stent patent with ostial 20-30% prior to the stent, ostial PDA 50%, EF 55-65%. PCI:  Promus DES to the mid LAD and POBA to the proximal D1   . Hypertension   . Arthritis   . Hyperlipidemia   . Obesity    Past Surgical History  Procedure Laterality Date  . Lithotripsy    . Coronary angioplasty with stent placement  04/24/12    DES-RCA  . Hernia repair Left inguinal  . Coronary angioplasty with stent placement  02/15/13    Patent RCA stent, diffuse nonobstructive R PDA, LCx disease, 99% mid LAD stenosis s/p DES-mid LAD; LVEF 60%  . Coronary stent placement    . Left heart catheterization with coronary angiogram N/A 04/21/2012    Procedure: LEFT HEART CATHETERIZATION WITH CORONARY ANGIOGRAM;  Surgeon: Hillary Bow, MD;  Location: [facility] CATH LAB;  Service: Cardiovascular;  Laterality: N/A;  . Percutaneous coronary stent intervention (pci-s) N/A 04/24/2012    Procedure:  PERCUTANEOUS CORONARY STENT INTERVENTION (PCI-S);  Surgeon: Sherren Mocha, MD;  Location: [facility] CATH LAB;  Service: Cardiovascular;  Laterality: N/A;  . Left heart catheterization with coronary angiogram N/A 02/15/2013    Procedure: LEFT HEART CATHETERIZATION WITH CORONARY ANGIOGRAM;  Surgeon: Sherren Mocha, MD;  Location: [facility] CATH LAB;  Service: Cardiovascular;  Laterality: N/A;  . Percutaneous coronary stent intervention (pci-s)  02/15/2013    Procedure: PERCUTANEOUS CORONARY STENT INTERVENTION (PCI-S);  Surgeon: Sherren Mocha, MD;  Location: [facility] CATH LAB;  Service: Cardiovascular;;   No family history on file. Social History  Substance Use Topics  . Smoking status: Never Smoker   . Smokeless tobacco: Never Used  . Alcohol Use: No     Comment: " quit along time ago"    Review of Systems  Constitutional: Negative for fever and chills.  Gastrointestinal: Negative for nausea and vomiting.  Musculoskeletal: Negative for joint swelling and arthralgias.  Skin: Positive for color change.  Neurological: Negative for dizziness, weakness and numbness.  Hematological: Negative for adenopathy.  All other systems reviewed and are negative.     Allergies  Review of patient's allergies indicates no known allergies.  Home Medications   Prior to Admission medications   Medication Sig Start Date End Date Taking? Authorizing Provider  acetaminophen (TYLENOL) 500 MG tablet Take 500-1,000 mg by mouth every 6 (six)  hours as needed for mild pain or moderate pain.   Yes Historical Provider, MD  amLODipine (NORVASC) 5 MG tablet TAKE 1 TABLET DAILY 12/25/13  Yes Sherren Mocha, MD  aspirin EC 81 MG tablet Take 81 mg by mouth daily.   Yes Historical Provider, MD  clopidogrel (PLAVIX) 75 MG tablet TAKE 1 TABLET (75 MG TOTAL) DAILY WITH BREAKFAST. 12/25/13  Yes Sherren Mocha, MD  isosorbide mononitrate (IMDUR) 60 MG 24 hr tablet Take 1.5 tablets (90 mg total) by mouth daily. Patient taking differently: Take 60  mg by mouth daily.  04/25/13  Yes Sherren Mocha, MD  lisinopril-hydrochlorothiazide (PRINZIDE,ZESTORETIC) 20-12.5 MG per tablet TAKE 1 TABLET EVERY DAY 05/13/14  Yes Sherren Mocha, MD  metoprolol tartrate (LOPRESSOR) 25 MG tablet TAKE 1 TABLET TWICE A DAY   Yes Sherren Mocha, MD  naproxen (NAPROSYN) 500 MG tablet Take 500 mg by mouth 2 (two) times daily. 03/22/16  Yes Historical Provider, MD  nitroGLYCERIN (NITROSTAT) 0.4 MG SL tablet Place 1 tablet (0.4 mg total) under the tongue every 5 (five) minutes x 3 doses as needed for chest pain. 04/25/12 04/18/16 Yes Rogelia Mire, NP  simvastatin (ZOCOR) 10 MG tablet TAKE 1 TABLET AT BEDTIME 02/05/15  Yes Sherren Mocha, MD   BP 130/76 mmHg  Pulse 80  Temp(Src) 98.7 F (37.1 C) (Oral)  Resp 18  Ht 5\' 9"  (1.753 m)  Wt 110.224 kg  BMI 35.87 kg/m2  SpO2 98% Physical Exam  Constitutional: He is oriented to person, place, and time. He appears well-developed and well-nourished. No distress.  HENT:  Head: Normocephalic and atraumatic.  Cardiovascular: Normal rate, regular rhythm and intact distal pulses.   No murmur heard. Pulmonary/Chest: Effort normal and breath sounds normal. No respiratory distress.  Musculoskeletal: He exhibits no edema or tenderness.  Pt has full ROM of the wrist, elbow.  Compartments soft, sensation intact  Neurological: He is alert and oriented to person, place, and time. He exhibits normal muscle tone. Coordination normal.  Skin: Skin is warm and dry.  Small slightly open lesion to dorsal left forearm with surrounding erythema to dorsal surface, does not extend to the elbow or wrist joint.  No drainage or edema.   Nursing note and vitals reviewed.   ED Course  Procedures (including critical care time) Labs Review Labs Reviewed - No data to display  Imaging Review No results found. I have personally reviewed and evaluated these images and lab results as part of my medical decision-making.   EKG  Interpretation None      MDM   Final diagnoses:  Cellulitis of forearm, left    Pt well appearing.  Vitals stable.  Small wound to the left forearm with moderate surrounding erythema.  No abscess as of yet, possible cellulitis from insect bite vs weed eater injury, no FB's  Pt seen by Dr. Rogene Houston and care plan discussed.    NV intact.  Leading edge of erythema marked by me.  IV vancomycin given here.  Will have pt return here in 2 days for recheck or f/u with PMD for the recheck.  Rx for bactrim.  Pt agrees to ibuprofen and/or tylenol if needed    Kem Parkinson, PA-C 04/21/16 [place], MD 04/22/16 [phone]

## 2016-04-19 NOTE — Discharge Instructions (Signed)
Cellulitis °Cellulitis is an infection of the skin and the tissue under the skin. The infected area is usually red and tender. This happens most often in the arms and lower legs. °HOME CARE  °· Take your antibiotic medicine as told. Finish the medicine even if you start to feel better. °· Keep the infected arm or leg raised (elevated). °· Put a warm cloth on the area up to 4 times per day. °· Only take medicines as told by your doctor. °· Keep all doctor visits as told. °GET HELP IF: °· You see red streaks on the skin coming from the infected area. °· Your red area gets bigger or turns a dark color. °· Your bone or joint under the infected area is painful after the skin heals. °· Your infection comes back in the same area or different area. °· You have a puffy (swollen) bump in the infected area. °· You have new symptoms. °· You have a fever. °GET HELP RIGHT AWAY IF:  °· You feel very sleepy. °· You throw up (vomit) or have watery poop (diarrhea). °· You feel sick and have muscle aches and pains. °  °This information is not intended to replace advice given to you by your health care provider. Make sure you discuss any questions you have with your health care provider. °  °Document Released: 04/26/2008 Document Revised: 07/30/2015 Document Reviewed: 01/24/2012 °Elsevier Interactive Patient Education ©2016 [employer] ° °

## 2016-04-20 DIAGNOSIS — L03114 Cellulitis of left upper limb: Secondary | ICD-10-CM | POA: Diagnosis not present

## 2016-04-20 DIAGNOSIS — I10 Essential (primary) hypertension: Secondary | ICD-10-CM | POA: Diagnosis not present

## 2016-04-20 DIAGNOSIS — L02414 Cutaneous abscess of left upper limb: Secondary | ICD-10-CM | POA: Diagnosis not present

## 2016-04-20 DIAGNOSIS — I2510 Atherosclerotic heart disease of native coronary artery without angina pectoris: Secondary | ICD-10-CM | POA: Diagnosis not present

## 2016-04-20 DIAGNOSIS — E782 Mixed hyperlipidemia: Secondary | ICD-10-CM | POA: Diagnosis not present

## 2016-04-21 DIAGNOSIS — B9562 Methicillin resistant Staphylococcus aureus infection as the cause of diseases classified elsewhere: Secondary | ICD-10-CM | POA: Diagnosis not present

## 2016-04-21 DIAGNOSIS — R509 Fever, unspecified: Secondary | ICD-10-CM | POA: Diagnosis not present

## 2016-04-21 DIAGNOSIS — Z7982 Long term (current) use of aspirin: Secondary | ICD-10-CM | POA: Diagnosis not present

## 2016-04-21 DIAGNOSIS — L02414 Cutaneous abscess of left upper limb: Secondary | ICD-10-CM | POA: Diagnosis not present

## 2016-04-21 DIAGNOSIS — I10 Essential (primary) hypertension: Secondary | ICD-10-CM | POA: Diagnosis not present

## 2016-04-21 DIAGNOSIS — M79632 Pain in left forearm: Secondary | ICD-10-CM | POA: Diagnosis not present

## 2016-04-21 DIAGNOSIS — E782 Mixed hyperlipidemia: Secondary | ICD-10-CM | POA: Diagnosis not present

## 2016-04-21 DIAGNOSIS — L03114 Cellulitis of left upper limb: Secondary | ICD-10-CM | POA: Diagnosis not present

## 2016-04-21 DIAGNOSIS — I2510 Atherosclerotic heart disease of native coronary artery without angina pectoris: Secondary | ICD-10-CM | POA: Diagnosis not present

## 2016-04-21 DIAGNOSIS — E785 Hyperlipidemia, unspecified: Secondary | ICD-10-CM | POA: Diagnosis not present

## 2016-04-21 DIAGNOSIS — E7800 Pure hypercholesterolemia, unspecified: Secondary | ICD-10-CM | POA: Diagnosis not present

## 2016-04-21 DIAGNOSIS — Z7902 Long term (current) use of antithrombotics/antiplatelets: Secondary | ICD-10-CM | POA: Diagnosis not present

## 2016-04-21 DIAGNOSIS — Z79899 Other long term (current) drug therapy: Secondary | ICD-10-CM | POA: Diagnosis not present

## 2016-04-21 DIAGNOSIS — M1990 Unspecified osteoarthritis, unspecified site: Secondary | ICD-10-CM | POA: Diagnosis not present

## 2016-04-21 DIAGNOSIS — M7989 Other specified soft tissue disorders: Secondary | ICD-10-CM | POA: Diagnosis not present

## 2016-04-22 DIAGNOSIS — I25810 Atherosclerosis of coronary artery bypass graft(s) without angina pectoris: Secondary | ICD-10-CM | POA: Diagnosis not present

## 2016-04-22 DIAGNOSIS — L03114 Cellulitis of left upper limb: Secondary | ICD-10-CM | POA: Diagnosis not present

## 2016-04-22 DIAGNOSIS — L089 Local infection of the skin and subcutaneous tissue, unspecified: Secondary | ICD-10-CM | POA: Diagnosis not present

## 2016-04-22 DIAGNOSIS — I10 Essential (primary) hypertension: Secondary | ICD-10-CM | POA: Diagnosis not present

## 2016-04-23 DIAGNOSIS — I10 Essential (primary) hypertension: Secondary | ICD-10-CM | POA: Diagnosis not present

## 2016-04-23 DIAGNOSIS — L03114 Cellulitis of left upper limb: Secondary | ICD-10-CM | POA: Diagnosis not present

## 2016-04-24 DIAGNOSIS — L03114 Cellulitis of left upper limb: Secondary | ICD-10-CM | POA: Diagnosis not present

## 2016-04-24 DIAGNOSIS — I10 Essential (primary) hypertension: Secondary | ICD-10-CM | POA: Diagnosis not present

## 2016-04-25 DIAGNOSIS — I10 Essential (primary) hypertension: Secondary | ICD-10-CM | POA: Diagnosis not present

## 2016-04-25 DIAGNOSIS — L03114 Cellulitis of left upper limb: Secondary | ICD-10-CM | POA: Diagnosis not present

## 2016-04-26 DIAGNOSIS — L03114 Cellulitis of left upper limb: Secondary | ICD-10-CM | POA: Diagnosis not present

## 2016-04-26 DIAGNOSIS — B9562 Methicillin resistant Staphylococcus aureus infection as the cause of diseases classified elsewhere: Secondary | ICD-10-CM | POA: Diagnosis not present

## 2016-04-26 DIAGNOSIS — L02414 Cutaneous abscess of left upper limb: Secondary | ICD-10-CM | POA: Diagnosis not present

## 2016-04-27 DIAGNOSIS — I10 Essential (primary) hypertension: Secondary | ICD-10-CM | POA: Diagnosis not present

## 2016-04-27 DIAGNOSIS — L03114 Cellulitis of left upper limb: Secondary | ICD-10-CM | POA: Diagnosis not present

## 2016-04-27 DIAGNOSIS — A419 Sepsis, unspecified organism: Secondary | ICD-10-CM | POA: Diagnosis not present

## 2016-04-27 DIAGNOSIS — Z7982 Long term (current) use of aspirin: Secondary | ICD-10-CM | POA: Diagnosis not present

## 2016-04-28 DIAGNOSIS — L03114 Cellulitis of left upper limb: Secondary | ICD-10-CM | POA: Diagnosis not present

## 2016-04-28 DIAGNOSIS — A419 Sepsis, unspecified organism: Secondary | ICD-10-CM | POA: Diagnosis not present

## 2016-04-28 DIAGNOSIS — I10 Essential (primary) hypertension: Secondary | ICD-10-CM | POA: Diagnosis not present

## 2016-05-05 DIAGNOSIS — L02414 Cutaneous abscess of left upper limb: Secondary | ICD-10-CM | POA: Diagnosis not present

## 2016-05-06 DIAGNOSIS — Z7982 Long term (current) use of aspirin: Secondary | ICD-10-CM | POA: Diagnosis not present

## 2016-05-06 DIAGNOSIS — L03114 Cellulitis of left upper limb: Secondary | ICD-10-CM | POA: Diagnosis not present

## 2016-05-06 DIAGNOSIS — I10 Essential (primary) hypertension: Secondary | ICD-10-CM | POA: Diagnosis not present

## 2016-05-06 DIAGNOSIS — A419 Sepsis, unspecified organism: Secondary | ICD-10-CM | POA: Diagnosis not present

## 2016-05-13 DIAGNOSIS — M6208 Separation of muscle (nontraumatic), other site: Secondary | ICD-10-CM | POA: Diagnosis not present

## 2016-05-13 DIAGNOSIS — K429 Umbilical hernia without obstruction or gangrene: Secondary | ICD-10-CM | POA: Diagnosis not present

## 2016-05-14 DIAGNOSIS — L03114 Cellulitis of left upper limb: Secondary | ICD-10-CM | POA: Diagnosis not present

## 2016-05-14 DIAGNOSIS — A419 Sepsis, unspecified organism: Secondary | ICD-10-CM | POA: Diagnosis not present

## 2016-05-14 DIAGNOSIS — Z7982 Long term (current) use of aspirin: Secondary | ICD-10-CM | POA: Diagnosis not present

## 2016-05-14 DIAGNOSIS — I10 Essential (primary) hypertension: Secondary | ICD-10-CM | POA: Diagnosis not present

## 2016-05-26 DIAGNOSIS — L03114 Cellulitis of left upper limb: Secondary | ICD-10-CM | POA: Diagnosis not present

## 2016-12-24 DIAGNOSIS — J0101 Acute recurrent maxillary sinusitis: Secondary | ICD-10-CM | POA: Diagnosis not present

## 2016-12-24 DIAGNOSIS — H539 Unspecified visual disturbance: Secondary | ICD-10-CM | POA: Diagnosis not present

## 2017-01-18 DIAGNOSIS — H43812 Vitreous degeneration, left eye: Secondary | ICD-10-CM | POA: Diagnosis not present

## 2017-02-10 DIAGNOSIS — I10 Essential (primary) hypertension: Secondary | ICD-10-CM | POA: Diagnosis not present

## 2017-02-10 DIAGNOSIS — Z809 Family history of malignant neoplasm, unspecified: Secondary | ICD-10-CM | POA: Diagnosis not present

## 2017-02-10 DIAGNOSIS — I2510 Atherosclerotic heart disease of native coronary artery without angina pectoris: Secondary | ICD-10-CM | POA: Diagnosis not present

## 2017-02-10 DIAGNOSIS — Z1211 Encounter for screening for malignant neoplasm of colon: Secondary | ICD-10-CM | POA: Diagnosis not present

## 2017-02-10 DIAGNOSIS — Z79899 Other long term (current) drug therapy: Secondary | ICD-10-CM | POA: Diagnosis not present

## 2017-02-10 DIAGNOSIS — Q438 Other specified congenital malformations of intestine: Secondary | ICD-10-CM | POA: Diagnosis not present

## 2017-02-10 DIAGNOSIS — Z7901 Long term (current) use of anticoagulants: Secondary | ICD-10-CM | POA: Diagnosis not present

## 2017-02-10 DIAGNOSIS — K644 Residual hemorrhoidal skin tags: Secondary | ICD-10-CM | POA: Diagnosis not present

## 2017-02-10 DIAGNOSIS — Z7982 Long term (current) use of aspirin: Secondary | ICD-10-CM | POA: Diagnosis not present

## 2017-02-10 DIAGNOSIS — M1990 Unspecified osteoarthritis, unspecified site: Secondary | ICD-10-CM | POA: Diagnosis not present

## 2017-02-10 DIAGNOSIS — E785 Hyperlipidemia, unspecified: Secondary | ICD-10-CM | POA: Diagnosis not present

## 2017-02-10 DIAGNOSIS — K640 First degree hemorrhoids: Secondary | ICD-10-CM | POA: Diagnosis not present

## 2017-02-10 DIAGNOSIS — Z87442 Personal history of urinary calculi: Secondary | ICD-10-CM | POA: Diagnosis not present

## 2017-06-17 DIAGNOSIS — L247 Irritant contact dermatitis due to plants, except food: Secondary | ICD-10-CM | POA: Diagnosis not present

## 2017-06-17 DIAGNOSIS — R0789 Other chest pain: Secondary | ICD-10-CM | POA: Diagnosis not present

## 2017-06-17 DIAGNOSIS — Z6836 Body mass index (BMI) 36.0-36.9, adult: Secondary | ICD-10-CM | POA: Diagnosis not present

## 2017-07-29 DIAGNOSIS — E782 Mixed hyperlipidemia: Secondary | ICD-10-CM | POA: Diagnosis not present

## 2017-07-29 DIAGNOSIS — R5383 Other fatigue: Secondary | ICD-10-CM | POA: Diagnosis not present

## 2017-07-29 DIAGNOSIS — I10 Essential (primary) hypertension: Secondary | ICD-10-CM | POA: Diagnosis not present

## 2017-08-02 DIAGNOSIS — I10 Essential (primary) hypertension: Secondary | ICD-10-CM | POA: Diagnosis not present

## 2017-08-02 DIAGNOSIS — R0789 Other chest pain: Secondary | ICD-10-CM | POA: Diagnosis not present

## 2017-08-02 DIAGNOSIS — Z0001 Encounter for general adult medical examination with abnormal findings: Secondary | ICD-10-CM | POA: Diagnosis not present

## 2017-08-02 DIAGNOSIS — Z6836 Body mass index (BMI) 36.0-36.9, adult: Secondary | ICD-10-CM | POA: Diagnosis not present

## 2017-08-02 DIAGNOSIS — N401 Enlarged prostate with lower urinary tract symptoms: Secondary | ICD-10-CM | POA: Diagnosis not present

## 2017-08-02 DIAGNOSIS — I209 Angina pectoris, unspecified: Secondary | ICD-10-CM | POA: Diagnosis not present

## 2017-08-02 DIAGNOSIS — R946 Abnormal results of thyroid function studies: Secondary | ICD-10-CM | POA: Diagnosis not present

## 2017-08-02 DIAGNOSIS — I2510 Atherosclerotic heart disease of native coronary artery without angina pectoris: Secondary | ICD-10-CM | POA: Diagnosis not present

## 2017-08-02 DIAGNOSIS — E782 Mixed hyperlipidemia: Secondary | ICD-10-CM | POA: Diagnosis not present

## 2017-08-17 DIAGNOSIS — K625 Hemorrhage of anus and rectum: Secondary | ICD-10-CM | POA: Diagnosis not present

## 2017-08-22 DIAGNOSIS — Z955 Presence of coronary angioplasty implant and graft: Secondary | ICD-10-CM | POA: Diagnosis not present

## 2017-08-22 DIAGNOSIS — M1990 Unspecified osteoarthritis, unspecified site: Secondary | ICD-10-CM | POA: Diagnosis not present

## 2017-08-22 DIAGNOSIS — Z79899 Other long term (current) drug therapy: Secondary | ICD-10-CM | POA: Diagnosis not present

## 2017-08-22 DIAGNOSIS — E7800 Pure hypercholesterolemia, unspecified: Secondary | ICD-10-CM | POA: Diagnosis not present

## 2017-08-22 DIAGNOSIS — Z7982 Long term (current) use of aspirin: Secondary | ICD-10-CM | POA: Diagnosis not present

## 2017-08-22 DIAGNOSIS — Z7902 Long term (current) use of antithrombotics/antiplatelets: Secondary | ICD-10-CM | POA: Diagnosis not present

## 2017-08-22 DIAGNOSIS — K625 Hemorrhage of anus and rectum: Secondary | ICD-10-CM | POA: Diagnosis not present

## 2017-08-22 DIAGNOSIS — Z87442 Personal history of urinary calculi: Secondary | ICD-10-CM | POA: Diagnosis not present

## 2017-08-22 DIAGNOSIS — K648 Other hemorrhoids: Secondary | ICD-10-CM | POA: Diagnosis not present

## 2017-08-22 DIAGNOSIS — K645 Perianal venous thrombosis: Secondary | ICD-10-CM | POA: Diagnosis not present

## 2017-08-22 DIAGNOSIS — I10 Essential (primary) hypertension: Secondary | ICD-10-CM | POA: Diagnosis not present

## 2017-08-22 DIAGNOSIS — K649 Unspecified hemorrhoids: Secondary | ICD-10-CM | POA: Diagnosis not present

## 2017-08-24 ENCOUNTER — Ambulatory Visit (INDEPENDENT_AMBULATORY_CARE_PROVIDER_SITE_OTHER): Payer: PPO | Admitting: Physician Assistant

## 2017-08-24 ENCOUNTER — Encounter: Payer: Self-pay | Admitting: Physician Assistant

## 2017-08-24 VITALS — BP 130/80 | HR 58 | Ht 69.0 in | Wt 249.8 lb

## 2017-08-24 DIAGNOSIS — I10 Essential (primary) hypertension: Secondary | ICD-10-CM | POA: Diagnosis not present

## 2017-08-24 DIAGNOSIS — E785 Hyperlipidemia, unspecified: Secondary | ICD-10-CM

## 2017-08-24 DIAGNOSIS — I25118 Atherosclerotic heart disease of native coronary artery with other forms of angina pectoris: Secondary | ICD-10-CM | POA: Diagnosis not present

## 2017-08-24 MED ORDER — ATORVASTATIN CALCIUM 40 MG PO TABS: 40.0000 mg | ORAL_TABLET | Freq: Every day | ORAL | 3 refills | Status: DC

## 2017-08-24 MED ORDER — ISOSORBIDE MONONITRATE ER 120 MG PO TB24: 120.0000 mg | ORAL_TABLET | Freq: Every day | ORAL | 3 refills | Status: AC

## 2017-08-24 NOTE — Progress Notes (Signed)
Cardiology Office Note:    Date:  08/24/2017   ID:  Kyle Lambert, DOB 1951/11/11, MRN [id number]  PCP:  Curlene Labrum, MD  Cardiologist:  Dr. Sherren Mocha    Referring MD: Curlene Labrum, MD   Chief Complaint  Patient presents with  . Chest Pain    History of Present Illness:    Kyle Lambert is a 66 y.o. male with a hx of CAD s/p DES to the RCA in 2013 and DES to the mid LAD and POBA of the proximal D1 in 2014.  Last seen by Dr. Sherren Mocha in 2016.    Mr. Krantz returns for evaluation of chest pain and shortness of breath.  Over the past 2-3 mos he has noted exertional chest pain and shortness of breath with mod to extreme activities.  He has anginal symptoms after pushing his lawnmower for about 30 minutes. He has to stop to resolve the pain.  He has only taken NTG x 1.  He can walk on level ground, vacuum without chest symptoms.  He denies syncope, paroxysmal nocturnal dyspnea, edema.  He had hernia surgery last week under general anesthesia.  He is to resume his Plavix tomorrow.   Prior CV studies:   The following studies were reviewed today:  LHC 02/15/13:  proximal LAD 40-50%, mid LAD 95%, small D1 95-99% mid CFX 30-40% proximal RCA stent patent with ostial 20-30% prior to the stent, ostial PDA 50% EF 55-65%.  PCI:  Promus DES to the mid LAD and POBA to the proximal D1.   Past Medical History:  Diagnosis Date  . Arthritis   . CAD (coronary artery disease)    a. 03/2012 Cath: LAD 80, RCA 80, nl EF;  b. PCI of RCA w 3.0x26mm Promus DES, FFR of LAD was nl @ 0.85 ->Med Rx.;  c. LHC 02/15/13: proximal LAD 40-50%, mid LAD 95%, small D1 95-99%, mid CFX 30-40%, proximal RCA stent patent with ostial 20-30% prior to the stent, ostial PDA 50%, EF 55-65%. PCI:  Promus DES to the mid LAD and POBA to the proximal D1   . Hyperlipidemia   . Hypertension   . Obesity     Past Surgical History:  Procedure Laterality Date  . CORONARY ANGIOPLASTY WITH STENT PLACEMENT  04/24/12   DES-RCA  . CORONARY ANGIOPLASTY WITH STENT PLACEMENT  02/15/13   Patent RCA stent, diffuse nonobstructive R PDA, LCx disease, 99% mid LAD stenosis s/p DES-mid LAD; LVEF 60%  . CORONARY STENT PLACEMENT    . HERNIA REPAIR Left inguinal  . LEFT HEART CATHETERIZATION WITH CORONARY ANGIOGRAM N/A 04/21/2012   Procedure: LEFT HEART CATHETERIZATION WITH CORONARY ANGIOGRAM;  Surgeon: Hillary Bow, MD;  Location: [facility] CATH LAB;  Service: Cardiovascular;  Laterality: N/A;  . LEFT HEART CATHETERIZATION WITH CORONARY ANGIOGRAM N/A 02/15/2013   Procedure: LEFT HEART CATHETERIZATION WITH CORONARY ANGIOGRAM;  Surgeon: Sherren Mocha, MD;  Location: [facility] CATH LAB;  Service: Cardiovascular;  Laterality: N/A;  . LITHOTRIPSY    . PERCUTANEOUS CORONARY STENT INTERVENTION (PCI-S) N/A 04/24/2012   Procedure: PERCUTANEOUS CORONARY STENT INTERVENTION (PCI-S);  Surgeon: Sherren Mocha, MD;  Location: [facility] CATH LAB;  Service: Cardiovascular;  Laterality: N/A;  . PERCUTANEOUS CORONARY STENT INTERVENTION (PCI-S)  02/15/2013   Procedure: PERCUTANEOUS CORONARY STENT INTERVENTION (PCI-S);  Surgeon: Sherren Mocha, MD;  Location: [facility] CATH LAB;  Service: Cardiovascular;;    Current Medications: Current Meds  Medication Sig  . acetaminophen (TYLENOL) 500 MG tablet Take 500-1,000 mg by  mouth every 6 (six) hours as needed for mild pain or moderate pain.  Marland Kitchen amLODipine (NORVASC) 5 MG tablet TAKE 1 TABLET DAILY  . aspirin EC 81 MG tablet Take 81 mg by mouth daily.  . ciprofloxacin (CIPRO) 500 MG tablet Take 500 mg by mouth 2 (two) times daily.  . clopidogrel (PLAVIX) 75 MG tablet TAKE 1 TABLET (75 MG TOTAL) DAILY WITH BREAKFAST.  Marland Kitchen Docusate Calcium (STOOL SOFTENER PO) TAKE 50 MG TABLET BY MOUTH DAILY (OTC MEDICATION)   . HYDROcodone-acetaminophen (NORCO) 10-325 MG tablet Take 1 tablet by mouth every 6 (six) hours as needed.  Marland Kitchen lisinopril-hydrochlorothiazide (PRINZIDE,ZESTORETIC) 20-12.5 MG per tablet TAKE 1 TABLET EVERY DAY  . metoprolol  tartrate (LOPRESSOR) 25 MG tablet TAKE 1 TABLET TWICE A DAY  . nitroGLYCERIN (NITROSTAT) 0.4 MG SL tablet Place 1 tablet (0.4 mg total) under the tongue every 5 (five) minutes x 3 doses as needed for chest pain.  . [DISCONTINUED] isosorbide mononitrate (IMDUR) 60 MG 24 hr tablet Take 60 mg by mouth daily.  . [DISCONTINUED] simvastatin (ZOCOR) 10 MG tablet TAKE 1 TABLET AT BEDTIME     Allergies:   Patient has no known allergies.   Social History   Social History  . Marital status: Married    Spouse name: N/A  . Number of children: N/A  . Years of education: N/A   Social History Main Topics  . Smoking status: Never Smoker  . Smokeless tobacco: Never Used  . Alcohol use No     Comment: " quit along time ago"  . Drug use: No  . Sexual activity: Not Currently   Other Topics Concern  . None   Social History Narrative  . None     Family Hx: The patient's family history is not on file.  ROS:   Please see the history of present illness.    Review of Systems  Cardiovascular: Positive for chest pain and dyspnea on exertion.  Respiratory: Positive for snoring.   Musculoskeletal: Positive for back pain and myalgias.  Gastrointestinal: Positive for abdominal pain and constipation.  Neurological: Positive for dizziness.  Psychiatric/Behavioral: Positive for depression.   All other systems reviewed and are negative.   EKGs/Labs/Other Test Reviewed:    EKG:  EKG is  ordered today.  The ekg ordered today demonstrates NSR, HR 58, normal axis, QTC 469 ms, no change from prior tracing  Recent Labs: No results found for requested labs within last 8760 hours.  Cholesterol, total 173.000 m 07/30/2017 HDL 38.000 mg 07/30/2017 LDL 103.000 m 07/30/2017 Triglycerides 161.000 m 07/30/2017 A1C 6.300 04/23/2012 Hemoglobin 14.800 g/ 07/30/2017 Creatinine, Serum 0.800 mg/ 07/30/2017 Potassium 4.600 mm 01/03/2016 ALT (SGPT) 23.000 IU/ 07/30/2017 TSH 5.990 07/30/2017 Platelets 268.000 x1 07/30/2017 Albumin,  serum 4.200 g/dL 07/30/2017 BUN 12.000 mg 07/30/2017  Recent Lipid Panel No results found for: CHOL, TRIG, HDL, CHOLHDL, LDLCALC, LDLDIRECT  Physical Exam:    VS:  BP 130/80   Pulse (!) 58   Ht 5\' 9"  (1.753 m)   Wt 249 lb 12.8 oz (113.3 kg)   SpO2 96%   BMI 36.89 kg/m     Wt Readings from Last 3 Encounters:  08/24/17 249 lb 12.8 oz (113.3 kg)  04/18/16 243 lb (110.2 kg)  05/06/15 240 lb 6.4 oz (109 kg)     Physical Exam  Constitutional: He is oriented to person, place, and time. He appears well-developed and well-nourished. No distress.  HENT:  Head: Normocephalic and atraumatic.  Eyes: No scleral icterus.  Neck: No JVD present.  Cardiovascular: Normal rate and regular rhythm.   No murmur heard. Pulmonary/Chest: Effort normal. He has no rales.  Abdominal: Soft. There is no tenderness.  Musculoskeletal: He exhibits no edema.  Neurological: He is alert and oriented to person, place, and time.  Skin: Skin is warm and dry.  Psychiatric: He has a normal mood and affect.    ASSESSMENT:    1. Coronary artery disease of native artery of native heart with stable angina pectoris ([place])   2. Essential hypertension   3. Hyperlipidemia, unspecified hyperlipidemia type    PLAN:    In order of problems listed above:  1. Coronary artery disease of native artery of native heart with stable angina pectoris ([place])  History of DES to the RCA in 2013 and DES to the mid LAD along with balloon angioplasty to the proximal D1 in 2014. He has recently started to notice exertional anginal symptoms. He has to perform extreme exertion to exacerbate symptoms. Therefore he seems to be CCS class 1-2. His ECG is unchanged. He recently had general anesthesia for hemorrhoid surgery without complication. Therefore, I think it is reasonable to proceed with advancing his antianginal therapy and obtaining stress testing to rule out high risk areas of ischemia. I reviewed this with the patient today and he is  comfortable with this approach.  -  Arrange exercise nuclear stress test  -  Increase isosorbide to 120 mg daily  -  Close follow-up in 2-3 weeks  2. Essential hypertension The patient's blood pressure is controlled on his current regimen.  Continue current therapy.    3. Hyperlipidemia, unspecified hyperlipidemia type Recent LDL 103. Target LDL is less than 70. DC simvastatin. Start atorvastatin 40 mg daily. Obtain lipids and LFTs in 3 months.   Dispo:  Return in about 2 weeks (around 09/07/2017) for Close Follow Up, w/ Dr. Burt Knack, or Richardson Dopp, PA-C.   Medication Adjustments/Labs and Tests Ordered: Current medicines are reviewed at length with the patient today.  Concerns regarding medicines are outlined above.  Tests Ordered: Orders Placed This Encounter  Procedures  . Lipid Profile  . Hepatic function panel  . Myocardial Perfusion Imaging  . EKG 12-Lead   Medication Changes: Meds ordered this encounter  Medications  . isosorbide mononitrate (IMDUR) 120 MG 24 hr tablet    Sig: Take 1 tablet (120 mg total) by mouth daily.    Dispense:  90 tablet    Refill:  3  . atorvastatin (LIPITOR) 40 MG tablet    Sig: Take 1 tablet (40 mg total) by mouth daily.    Dispense:  90 tablet    Refill:  3    Signed, Richardson Dopp, PA-C  08/24/2017 5:28 PM    [facility] [place], [place], [place]  [address] Phone: [phone]; Fax: [phone]

## 2017-08-24 NOTE — Patient Instructions (Signed)
Medication Instructions:  1. STOP SIMVASTATIN  2. START LIPITOR 40 MG DAILY; RX HAS BEEN SENT IN  3. INCREASE IMDUR TO 120 MG DAILY; NEW RX HAS BEEN SENT IN FOR THE 120 MG TABLET; YOU MAY DOUBLE UP ON THE 60 MG TABLET IF YOU WOULD LIKE  Labwork: 1. IN 3 MONTHS FASTING LIPID AND LIVER PANEL; NOTHING TO EAT AFTER MIDNIGHT THE NIGHT BEFORE LAB WORK. MORNING MEDICATIONS WITH WATER IS FINE THE MORNING OF LAB WORK  Testing/Procedures: 1. Your physician has requested that you have en exercise stress myoview. For further information please visit [email or web address]. Please follow instruction sheet, as given.    Follow-Up: SCOTT WEAVER, PAC 2-3 WEEKS WITH DR. Burt Knack IN THE OFFICE   Any Other Special Instructions Will Be Listed Below (If Applicable).     If you need a refill on your cardiac medications before your next appointment, please call your pharmacy.

## 2017-08-25 ENCOUNTER — Telehealth (HOSPITAL_COMMUNITY): Payer: Self-pay | Admitting: *Deleted

## 2017-08-25 NOTE — Telephone Encounter (Signed)
Patient given detailed instructions per Myocardial Perfusion Study Information Sheet for the test on 08/30/17. Patient notified to arrive 15 minutes early and that it is imperative to arrive on time for appointment to keep from having the test rescheduled.  If you need to cancel or reschedule your appointment, please call the office within 24 hours of your appointment. . Patient verbalized understanding. Kirstie Peri

## 2017-08-30 ENCOUNTER — Ambulatory Visit (HOSPITAL_COMMUNITY): Payer: PPO | Attending: Internal Medicine

## 2017-08-30 DIAGNOSIS — R0609 Other forms of dyspnea: Secondary | ICD-10-CM | POA: Insufficient documentation

## 2017-08-30 DIAGNOSIS — R9439 Abnormal result of other cardiovascular function study: Secondary | ICD-10-CM | POA: Insufficient documentation

## 2017-08-30 DIAGNOSIS — R079 Chest pain, unspecified: Secondary | ICD-10-CM | POA: Diagnosis not present

## 2017-08-30 DIAGNOSIS — I10 Essential (primary) hypertension: Secondary | ICD-10-CM | POA: Diagnosis not present

## 2017-08-30 DIAGNOSIS — I25118 Atherosclerotic heart disease of native coronary artery with other forms of angina pectoris: Secondary | ICD-10-CM | POA: Insufficient documentation

## 2017-08-30 LAB — MYOCARDIAL PERFUSION IMAGING
CHL CUP NUCLEAR SDS: 4
CHL CUP NUCLEAR SRS: 2
CHL CUP NUCLEAR SSS: 6
CSEPPHR: 90 {beats}/min
LV dias vol: 104 mL (ref 62–150)
LV sys vol: 34 mL
RATE: 0.37
Rest HR: 65 {beats}/min
TID: 1.02

## 2017-08-30 MED ORDER — TECHNETIUM TC 99M TETROFOSMIN IV KIT
31.8000 | PACK | Freq: Once | INTRAVENOUS | Status: AC | PRN
  Administered 2017-08-30: 31.8 via INTRAVENOUS
  Filled 2017-08-30: qty 32

## 2017-08-30 MED ORDER — TECHNETIUM TC 99M TETROFOSMIN IV KIT
10.7000 | PACK | Freq: Once | INTRAVENOUS | Status: AC | PRN
  Administered 2017-08-30: 10.7 via INTRAVENOUS
  Filled 2017-08-30: qty 11

## 2017-08-30 MED ORDER — REGADENOSON 0.4 MG/5ML IV SOLN
0.4000 mg | Freq: Once | INTRAVENOUS | Status: AC
  Administered 2017-08-30: 0.4 mg via INTRAVENOUS

## 2017-09-03 ENCOUNTER — Emergency Department (HOSPITAL_COMMUNITY)
Admission: EM | Admit: 2017-09-03 | Discharge: 2017-09-03 | Disposition: A | Discharge status: Home / Self Care | Payer: PPO | Attending: Emergency Medicine | Admitting: Emergency Medicine

## 2017-09-03 ENCOUNTER — Encounter (HOSPITAL_COMMUNITY): Payer: Self-pay | Admitting: Emergency Medicine

## 2017-09-03 DIAGNOSIS — Z79899 Other long term (current) drug therapy: Secondary | ICD-10-CM | POA: Diagnosis not present

## 2017-09-03 DIAGNOSIS — I2510 Atherosclerotic heart disease of native coronary artery without angina pectoris: Secondary | ICD-10-CM | POA: Diagnosis not present

## 2017-09-03 DIAGNOSIS — Z7982 Long term (current) use of aspirin: Secondary | ICD-10-CM | POA: Insufficient documentation

## 2017-09-03 DIAGNOSIS — I10 Essential (primary) hypertension: Secondary | ICD-10-CM | POA: Insufficient documentation

## 2017-09-03 DIAGNOSIS — K625 Hemorrhage of anus and rectum: Secondary | ICD-10-CM | POA: Diagnosis not present

## 2017-09-03 DIAGNOSIS — Z955 Presence of coronary angioplasty implant and graft: Secondary | ICD-10-CM | POA: Insufficient documentation

## 2017-09-03 LAB — I-STAT CHEM 8, ED
BUN: 13 mg/dL (ref 6–20)
CHLORIDE: 105 mmol/L (ref 101–111)
CREATININE: 0.6 mg/dL — AB (ref 0.61–1.24)
Calcium, Ion: 0.95 mmol/L — ABNORMAL LOW (ref 1.15–1.40)
GLUCOSE: 146 mg/dL — AB (ref 65–99)
HEMATOCRIT: 40 % (ref 39.0–52.0)
Hemoglobin: 13.6 g/dL (ref 13.0–17.0)
POTASSIUM: 3.8 mmol/L (ref 3.5–5.1)
Sodium: 137 mmol/L (ref 135–145)
TCO2: 21 mmol/L — ABNORMAL LOW (ref 22–32)

## 2017-09-03 NOTE — ED Triage Notes (Signed)
Pt. Stated, I had Hemorid surgery 2 weeks ago and his morning I was bleeding again

## 2017-09-03 NOTE — ED Provider Notes (Signed)
[facility] DEPT Provider Note   CSN: [id number] Arrival date & time: 09/03/17  1128     History   Chief Complaint Chief Complaint  Patient presents with  . Rectal Bleeding    HPI CALE BETHARD is a 66 y.o. male.  The history is provided by the patient and medical records. No language interpreter was used.  Rectal Bleeding  Associated symptoms: no abdominal pain and no vomiting    SATHVIK TIEDT is a 66 y.o. male  with PMH of CAD, HTN, HLD who presents to the Emergency Department complaining of bright red blood from rectum which began this morning. Patient states that he underwent hemorrhoidectomy 2 weeks ago by Dr. Ladona Horns in [place], [place]. He was suppose to have a follow up appointment on Monday. Patient states that he was healing well from surgery. He would intermittently note small amounts of bright red blood in stool since the procedure. This morning, he had a bowel movement and noted bright red blood in the stool. When he stood up, he had bright red blood dripping onto the floor. Wife placed tissues to compress the area. Bleeding improved upon ED arrival. Patient is on Plavix and aspirin. He has not taken Plavix since procedure, however he has been taking aspirin daily, most recently this morning. No weakness, n/v, abdominal pain, diarrhea or constipation. BM's have been regular without straining. No fever, chills.    Past Medical History:  Diagnosis Date  . Arthritis   . CAD (coronary artery disease)    a. 03/2012 Cath: LAD 80, RCA 80, nl EF;  b. PCI of RCA w 3.0x31mm Promus DES, FFR of LAD was nl @ 0.85 ->Med Rx.;  c. LHC 02/15/13: proximal LAD 40-50%, mid LAD 95%, small D1 95-99%, mid CFX 30-40%, proximal RCA stent patent with ostial 20-30% prior to the stent, ostial PDA 50%, EF 55-65%. PCI:  Promus DES to the mid LAD and POBA to the proximal D1   . Hyperlipidemia   . Hypertension   . Obesity     Patient Active Problem List   Diagnosis Date Noted  . Obesity 05/16/2012  . CAD  (coronary artery disease) 04/25/2012  . Hyperlipidemia 04/25/2012  . HTN (hypertension) 04/21/2012    Past Surgical History:  Procedure Laterality Date  . CORONARY ANGIOPLASTY WITH STENT PLACEMENT  04/24/12   DES-RCA  . CORONARY ANGIOPLASTY WITH STENT PLACEMENT  02/15/13   Patent RCA stent, diffuse nonobstructive R PDA, LCx disease, 99% mid LAD stenosis s/p DES-mid LAD; LVEF 60%  . CORONARY STENT PLACEMENT    . HERNIA REPAIR Left inguinal  . LEFT HEART CATHETERIZATION WITH CORONARY ANGIOGRAM N/A 04/21/2012   Procedure: LEFT HEART CATHETERIZATION WITH CORONARY ANGIOGRAM;  Surgeon: Hillary Bow, MD;  Location: [facility] CATH LAB;  Service: Cardiovascular;  Laterality: N/A;  . LEFT HEART CATHETERIZATION WITH CORONARY ANGIOGRAM N/A 02/15/2013   Procedure: LEFT HEART CATHETERIZATION WITH CORONARY ANGIOGRAM;  Surgeon: Sherren Mocha, MD;  Location: [facility] CATH LAB;  Service: Cardiovascular;  Laterality: N/A;  . LITHOTRIPSY    . PERCUTANEOUS CORONARY STENT INTERVENTION (PCI-S) N/A 04/24/2012   Procedure: PERCUTANEOUS CORONARY STENT INTERVENTION (PCI-S);  Surgeon: Sherren Mocha, MD;  Location[facility] CATH LAB;  Service: Cardiovascular;  Laterality: N/A;  . PERCUTANEOUS CORONARY STENT INTERVENTION (PCI-S)  02/15/2013   Procedure: PERCUTANEOUS CORONARY STENT INTERVENTION (PCI-S);  Surgeon: Sherren Mocha, MD;  Location: [facility] CATH LAB;  Service: Cardiovascular;;       Home Medications    Prior to Admission medications  Medication Sig Start Date End Date Taking? Authorizing Provider  acetaminophen (TYLENOL) 500 MG tablet Take 500-1,000 mg by mouth every 6 (six) hours as needed for mild pain or moderate pain.    [provider]  amLODipine (NORVASC) 5 MG tablet TAKE 1 TABLET DAILY 12/25/13   Sherren Mocha, MD  aspirin EC 81 MG tablet Take 81 mg by mouth daily.    [provider]  atorvastatin (LIPITOR) 40 MG tablet Take 1 tablet (40 mg total) by mouth daily. 08/24/17 11/22/17  Richardson Dopp T, PA-C    ciprofloxacin (CIPRO) 500 MG tablet Take 500 mg by mouth 2 (two) times daily.    [provider]  clopidogrel (PLAVIX) 75 MG tablet TAKE 1 TABLET (75 MG TOTAL) DAILY WITH BREAKFAST. 12/25/13   Sherren Mocha, MD  Docusate Calcium (STOOL SOFTENER PO) TAKE 50 MG TABLET BY MOUTH DAILY (OTC MEDICATION)     [provider]  HYDROcodone-acetaminophen (NORCO) 10-325 MG tablet Take 1 tablet by mouth every 6 (six) hours as needed.    [provider]  isosorbide mononitrate (IMDUR) 120 MG 24 hr tablet Take 1 tablet (120 mg total) by mouth daily. 08/24/17   Richardson Dopp T, PA-C  lisinopril-hydrochlorothiazide (PRINZIDE,ZESTORETIC) 20-12.5 MG per tablet TAKE 1 TABLET EVERY DAY 05/13/14   Sherren Mocha, MD  metoprolol tartrate (LOPRESSOR) 25 MG tablet TAKE 1 TABLET TWICE A Lacie Scotts, MD  nitroGLYCERIN (NITROSTAT) 0.4 MG SL tablet Place 1 tablet (0.4 mg total) under the tongue every 5 (five) minutes x 3 doses as needed for chest pain. 04/25/12 08/24/17  Rogelia Mire, NP    Family History No family history on file.  Social History Social History  Substance Use Topics  . Smoking status: Never Smoker  . Smokeless tobacco: Never Used  . Alcohol use No     Comment: " quit along time ago"     Allergies   Patient has no known allergies.   Review of Systems Review of Systems  Gastrointestinal: Positive for anal bleeding and hematochezia. Negative for abdominal pain, constipation, diarrhea, nausea and vomiting.  All other systems reviewed and are negative.    Physical Exam Updated Vital Signs BP 113/76   Pulse 67   Temp 97.8 F (36.6 C) (Oral)   Resp (!) 26   Ht 5\' 9"  (1.753 m)   Wt 112.9 kg (249 lb)   SpO2 95%   BMI 36.77 kg/m   Physical Exam  Constitutional: He is oriented to person, place, and time. He appears well-developed and well-nourished. No distress.  HENT:  Head: Normocephalic and atraumatic.  Cardiovascular: Normal rate, regular  rhythm and normal heart sounds.   No murmur heard. Pulmonary/Chest: Effort normal and breath sounds normal. No respiratory distress.  Abdominal: Soft. He exhibits no distension. There is no tenderness.  Genitourinary:  Genitourinary Comments: Small amount of bright red blood at surgical incision site around rectum.   Musculoskeletal: He exhibits no edema.  Neurological: He is alert and oriented to person, place, and time.  Skin: Skin is warm and dry.  Nursing note and vitals reviewed.    ED Treatments / Results  Labs (all labs ordered are listed, but only abnormal results are displayed) Labs Reviewed  I-STAT CHEM 8, ED    EKG  EKG Interpretation None       Radiology No results found.  Procedures Procedures (including critical care time)  Medications Ordered in ED Medications - No data to display  Initial Impression / Assessment and Plan / ED Course  I have reviewed the triage vital signs and the nursing notes.  Pertinent labs & imaging results that were available during my care of the patient were reviewed by me and considered in my medical decision making (see chart for details).    DERRICK TIEGS is a 66 y.o. male who presents to ED for BRBRB this morning. Hx of Hemorrhoidectomy 2 weeks ago by Dr. Ladona Horns in [place], [place]. Patient taking aspirin daily, including this morning. On exam, small amount of bright red blood at surgical incision site. Bleeding now controlled. I discussed case with his surgeon, Dr. Ladona Horns who recommends holding aspirin and continue with follow up on Monday. Hgb 13. 6. Return precautions given. Patient understands and agrees with plan. All questions answered.   Patient seen by and discussed with Dr. Maryan Rued who agrees with treatment plan.    Final Clinical Impressions(s) / ED Diagnoses   Final diagnoses:  BRBPR (bright red blood per rectum)    New Prescriptions New Prescriptions   No medications on file     Ward, Ozella Almond, PA-C 09/03/17 1407    Blanchie Dessert, MD 09/04/17 1918

## 2017-09-03 NOTE — Discharge Instructions (Signed)
Please keep your scheduled appointment on Monday.   Do not take your aspirin or plavix tomorrow or Monday. You can discuss when to resume these medications with the doctor on Monday.   Return to ER for new or worsening symptoms, any additional concerns.

## 2017-09-16 ENCOUNTER — Ambulatory Visit: Payer: PPO | Admitting: Physician Assistant

## 2017-09-26 DIAGNOSIS — K649 Unspecified hemorrhoids: Secondary | ICD-10-CM | POA: Insufficient documentation

## 2017-10-05 NOTE — Progress Notes (Signed)
Cardiology Office Note    Date:  10/06/2017   ID:  Kyle, Lambert 1951/04/19, MRN [id number]  PCP:  Curlene Labrum, MD  Cardiologist:  Dr. Burt Knack   Chief Complaint: stress test follow up   History of Present Illness:   Kyle Lambert is a 66 y.o. male  hx of CAD s/p DES to the RCA in 2013 and DES to the mid LAD and POBA of the proximal D1 in 2014, HTN and HLD presents for follow up.   Recently seen by APP for few months hx of exertional chest pain and shortness of breath with mod to extreme activities.  He has anginal symptoms after pushing his lawnmower for about 30 minutes. He recently had general anesthesia for hemorrhoid surgery without complication. Increased imdur to 120mg  daily. Follow up myoview low risk 08/30/17.   Seen in ER 09/03/17 for rectal bleeding. Small amount of bright red blood at surgical incision site. Advised to hold ASA for one week and follow up as outpatient.   Here today for follow up. No recurrent bleeding on ASA and plavix.  Patient is walking approximately 1.5 miles 5 times per week without any angina or dyspnea.  No orthopnea, PND, syncope, palpitation, melena or blood in his stool or urine.  Past Medical History:  Diagnosis Date  . Arthritis   . CAD (coronary artery disease)    a. 03/2012 Cath: LAD 80, RCA 80, nl EF;  b. PCI of RCA w 3.0x76mm Promus DES, FFR of LAD was nl @ 0.85 ->Med Rx.;  c. LHC 02/15/13: proximal LAD 40-50%, mid LAD 95%, small D1 95-99%, mid CFX 30-40%, proximal RCA stent patent with ostial 20-30% prior to the stent, ostial PDA 50%, EF 55-65%. PCI:  Promus DES to the mid LAD and POBA to the proximal D1   . Hyperlipidemia   . Hypertension   . Obesity     Past Surgical History:  Procedure Laterality Date  . CORONARY ANGIOPLASTY WITH STENT PLACEMENT  04/24/12   DES-RCA  . CORONARY ANGIOPLASTY WITH STENT PLACEMENT  02/15/13   Patent RCA stent, diffuse nonobstructive R PDA, LCx disease, 99% mid LAD stenosis s/p DES-mid LAD; LVEF 60%   . CORONARY STENT PLACEMENT    . HERNIA REPAIR Left inguinal  . LEFT HEART CATHETERIZATION WITH CORONARY ANGIOGRAM N/A 04/21/2012   Procedure: LEFT HEART CATHETERIZATION WITH CORONARY ANGIOGRAM;  Surgeon: Hillary Bow, MD;  Location: [facility] CATH LAB;  Service: Cardiovascular;  Laterality: N/A;  . LEFT HEART CATHETERIZATION WITH CORONARY ANGIOGRAM N/A 02/15/2013   Procedure: LEFT HEART CATHETERIZATION WITH CORONARY ANGIOGRAM;  Surgeon: Sherren Mocha, MD;  Location: [facility] CATH LAB;  Service: Cardiovascular;  Laterality: N/A;  . LITHOTRIPSY    . PERCUTANEOUS CORONARY STENT INTERVENTION (PCI-S) N/A 04/24/2012   Procedure: PERCUTANEOUS CORONARY STENT INTERVENTION (PCI-S);  Surgeon: Sherren Mocha, MD;  Location: [facility] CATH LAB;  Service: Cardiovascular;  Laterality: N/A;  . PERCUTANEOUS CORONARY STENT INTERVENTION (PCI-S)  02/15/2013   Procedure: PERCUTANEOUS CORONARY STENT INTERVENTION (PCI-S);  Surgeon: Sherren Mocha, MD;  Location: [facility] CATH LAB;  Service: Cardiovascular;;    Current Medications: Prior to Admission medications   Medication Sig Start Date End Date Taking? Authorizing Provider  acetaminophen (TYLENOL) 500 MG tablet Take 500-1,000 mg by mouth every 6 (six) hours as needed for mild pain or moderate pain.    [provider]  amLODipine (NORVASC) 5 MG tablet TAKE 1 TABLET DAILY 12/25/13   Sherren Mocha, MD  aspirin EC 81  MG tablet Take 81 mg by mouth daily.    [provider]  atorvastatin (LIPITOR) 40 MG tablet Take 1 tablet (40 mg total) by mouth daily. 08/24/17 11/22/17  Richardson Dopp T, PA-C  ciprofloxacin (CIPRO) 500 MG tablet Take 500 mg by mouth 2 (two) times daily.    [provider]  clopidogrel (PLAVIX) 75 MG tablet TAKE 1 TABLET (75 MG TOTAL) DAILY WITH BREAKFAST. 12/25/13   Sherren Mocha, MD  Docusate Calcium (STOOL SOFTENER PO) TAKE 50 MG TABLET BY MOUTH DAILY (OTC MEDICATION)     [provider]  HYDROcodone-acetaminophen (NORCO) 10-325 MG tablet  Take 1 tablet by mouth every 6 (six) hours as needed.    [provider]  isosorbide mononitrate (IMDUR) 120 MG 24 hr tablet Take 1 tablet (120 mg total) by mouth daily. 08/24/17   Richardson Dopp T, PA-C  lisinopril-hydrochlorothiazide (PRINZIDE,ZESTORETIC) 20-12.5 MG per tablet TAKE 1 TABLET EVERY DAY 05/13/14   Sherren Mocha, MD  metoprolol tartrate (LOPRESSOR) 25 MG tablet TAKE 1 TABLET TWICE A Lacie Scotts, MD  nitroGLYCERIN (NITROSTAT) 0.4 MG SL tablet Place 1 tablet (0.4 mg total) under the tongue every 5 (five) minutes x 3 doses as needed for chest pain. 04/25/12 08/24/17  Rogelia Mire, NP    Allergies:   Patient has no known allergies.   Social History   Socioeconomic History  . Marital status: Married    Spouse name: None  . Number of children: None  . Years of education: None  . Highest education level: None  Social Needs  . Financial resource strain: None  . Food insecurity - worry: None  . Food insecurity - inability: None  . Transportation needs - medical: None  . Transportation needs - non-medical: None  Occupational History  . None  Tobacco Use  . Smoking status: Never Smoker  . Smokeless tobacco: Never Used  Substance and Sexual Activity  . Alcohol use: No    Comment: " quit along time ago"  . Drug use: No  . Sexual activity: Not Currently  Other Topics Concern  . None  Social History Narrative  . None     Family History:  The patient's family history is not on file.   ROS:   Please see the history of present illness.    ROS All other systems reviewed and are negative.   PHYSICAL EXAM:   VS:  BP 128/88   Pulse 89   Ht 5\' 9"  (1.753 m)   Wt 249 lb 12 oz (113.3 kg)   SpO2 97%   BMI 36.88 kg/m    GEN: Well nourished, well developed, in no acute distress  HEENT: normal  Neck: no JVD, carotid bruits, or masses Cardiac: RRR; no murmurs, rubs, or gallops,no edema  Respiratory:  clear to auscultation bilaterally, normal work of  breathing GI: soft, nontender, nondistended, + BS MS: no deformity or atrophy  Skin: warm and dry, no rash Neuro:  Alert and Oriented x 3, Strength and sensation are intact Psych: euthymic mood, full affect  Wt Readings from Last 3 Encounters:  10/06/17 249 lb 12 oz (113.3 kg)  09/03/17 249 lb (112.9 kg)  08/24/17 249 lb 12.8 oz (113.3 kg)      Studies/Labs Reviewed:   EKG:  EKG is not ordered today.    Recent Labs: 09/03/2017: BUN 13; Creatinine, Ser 0.60; Hemoglobin 13.6; Potassium 3.8; Sodium 137   Lipid Panel No results found for: CHOL, TRIG, HDL, CHOLHDL,  VLDL, LDLCALC, LDLDIRECT  Additional studies/ records that were reviewed today include:   LHC 02/15/13:  proximal LAD 40-50%, mid LAD 95%, small D1 95-99% mid CFX 30-40% proximal RCA stent patent with ostial 20-30% prior to the stent, ostial PDA 50% EF 55-65%.  PCI: Promus DES to the mid LAD and POBA to the proximal D1.   Myoivew 08/2017  Nuclear stress EF: 67%.  There was no ST segment deviation noted during stress.  There is a small defect of mild severity present in the basal inferoseptal location. The defect is non-reversible and consistent with diaphragmatic attenuation artifact. No ischemia noted.  The left ventricular ejection fraction is hyperdynamic (>65%).  This is a low risk study.    ASSESSMENT & PLAN:    1. CAD s/p DES to the RCA in 2013 and DES to the mid LAD and POBA of the proximal D1 in 2014 - Reviewed with Dr. Cooper--> patient has no angina currently however required high dose of Imdur therefore will continue ASA and plavix.Will reevaluate next year if needs to discontinue Plavix or not. Continue statin, BB and ACE.  2. HLD - LDL was 103 on 07/2017. Will increase lipitor to 80mg  qd. LFT and lipid panel in 6 weeks. Encouraged diet modifications.   3. HTN - Stable and well controlled on current medications.     Medication Adjustments/Labs and Tests Ordered: Current medicines are  reviewed at length with the patient today.  Concerns regarding medicines are outlined above.  Medication changes, Labs and Tests ordered today are listed in the Patient Instructions below. Patient Instructions  Medication Instructions:  1. INCREASE LIPITOR 80 MG DAILY; NEW RX HAS BEEN SENT IN  Labwork: 1. 11/17/17 FOR FASTING LIPID AND LIVER PANEL DUE TO INCREASE IN YOUR LIPITOR ; NOTHING TO EAT AFTER MIDNIGHT THE NIGHT BEFORE LAB WORK, MORNING MEDICATIONS WITH WATER IS FINE.  Testing/Procedures: NONE ORDERED TODAY  Follow-Up: Your physician wants you to follow-up in: [place] will receive a reminder letter in the mail two months in advance. If you don't receive a letter, please call our office to schedule the follow-up appointment.   Any Other Special Instructions Will Be Listed Below (If Applicable).     If you need a refill on your cardiac medications before your next appointment, please call your pharmacy.      Jarrett Soho, PA  10/06/2017 11:05 AM    [facility] [place], [place], [place]  [address] Phone: [phone]; Fax: [phone]

## 2017-10-06 ENCOUNTER — Encounter: Payer: Self-pay | Admitting: Physician Assistant

## 2017-10-06 ENCOUNTER — Ambulatory Visit: Payer: PPO | Admitting: Physician Assistant

## 2017-10-06 VITALS — BP 128/88 | HR 89 | Ht 69.0 in | Wt 249.8 lb

## 2017-10-06 DIAGNOSIS — I10 Essential (primary) hypertension: Secondary | ICD-10-CM

## 2017-10-06 DIAGNOSIS — E785 Hyperlipidemia, unspecified: Secondary | ICD-10-CM

## 2017-10-06 DIAGNOSIS — I25118 Atherosclerotic heart disease of native coronary artery with other forms of angina pectoris: Secondary | ICD-10-CM

## 2017-10-06 MED ORDER — ATORVASTATIN CALCIUM 80 MG PO TABS: 80.0000 mg | ORAL_TABLET | Freq: Every day | ORAL | 3 refills | Status: DC

## 2017-10-06 NOTE — Patient Instructions (Signed)
Medication Instructions:  1. INCREASE LIPITOR 80 MG DAILY; NEW RX HAS BEEN SENT IN  Labwork: 1. 11/17/17 FOR FASTING LIPID AND LIVER PANEL DUE TO INCREASE IN YOUR LIPITOR ; NOTHING TO EAT AFTER MIDNIGHT THE NIGHT BEFORE LAB WORK, MORNING MEDICATIONS WITH WATER IS FINE.  Testing/Procedures: NONE ORDERED TODAY  Follow-Up: Your physician wants you to follow-up in: [place] will receive a reminder letter in the mail two months in advance. If you don't receive a letter, please call our office to schedule the follow-up appointment.   Any Other Special Instructions Will Be Listed Below (If Applicable).     If you need a refill on your cardiac medications before your next appointment, please call your pharmacy.

## 2017-10-28 DIAGNOSIS — R5383 Other fatigue: Secondary | ICD-10-CM | POA: Diagnosis not present

## 2017-10-28 DIAGNOSIS — I10 Essential (primary) hypertension: Secondary | ICD-10-CM | POA: Diagnosis not present

## 2017-10-28 DIAGNOSIS — M1991 Primary osteoarthritis, unspecified site: Secondary | ICD-10-CM | POA: Diagnosis not present

## 2017-10-28 DIAGNOSIS — R946 Abnormal results of thyroid function studies: Secondary | ICD-10-CM | POA: Diagnosis not present

## 2017-10-28 DIAGNOSIS — E782 Mixed hyperlipidemia: Secondary | ICD-10-CM | POA: Diagnosis not present

## 2017-11-03 DIAGNOSIS — N200 Calculus of kidney: Secondary | ICD-10-CM | POA: Diagnosis not present

## 2017-11-03 DIAGNOSIS — E782 Mixed hyperlipidemia: Secondary | ICD-10-CM | POA: Diagnosis not present

## 2017-11-03 DIAGNOSIS — Z6836 Body mass index (BMI) 36.0-36.9, adult: Secondary | ICD-10-CM | POA: Diagnosis not present

## 2017-11-03 DIAGNOSIS — I209 Angina pectoris, unspecified: Secondary | ICD-10-CM | POA: Diagnosis not present

## 2017-11-03 DIAGNOSIS — Z23 Encounter for immunization: Secondary | ICD-10-CM | POA: Diagnosis not present

## 2017-11-03 DIAGNOSIS — I10 Essential (primary) hypertension: Secondary | ICD-10-CM | POA: Diagnosis not present

## 2017-11-03 DIAGNOSIS — I2510 Atherosclerotic heart disease of native coronary artery without angina pectoris: Secondary | ICD-10-CM | POA: Diagnosis not present

## 2017-11-03 DIAGNOSIS — N3941 Urge incontinence: Secondary | ICD-10-CM | POA: Diagnosis not present

## 2017-11-17 ENCOUNTER — Other Ambulatory Visit: Payer: PPO

## 2017-11-25 ENCOUNTER — Other Ambulatory Visit: Payer: PPO

## 2018-02-09 DIAGNOSIS — I209 Angina pectoris, unspecified: Secondary | ICD-10-CM | POA: Diagnosis not present

## 2018-02-09 DIAGNOSIS — E782 Mixed hyperlipidemia: Secondary | ICD-10-CM | POA: Diagnosis not present

## 2018-02-09 DIAGNOSIS — I10 Essential (primary) hypertension: Secondary | ICD-10-CM | POA: Diagnosis not present

## 2018-02-09 DIAGNOSIS — N3941 Urge incontinence: Secondary | ICD-10-CM | POA: Diagnosis not present

## 2018-02-09 DIAGNOSIS — N401 Enlarged prostate with lower urinary tract symptoms: Secondary | ICD-10-CM | POA: Diagnosis not present

## 2018-02-09 DIAGNOSIS — R0789 Other chest pain: Secondary | ICD-10-CM | POA: Diagnosis not present

## 2018-02-09 DIAGNOSIS — N200 Calculus of kidney: Secondary | ICD-10-CM | POA: Diagnosis not present

## 2018-02-09 DIAGNOSIS — Z6837 Body mass index (BMI) 37.0-37.9, adult: Secondary | ICD-10-CM | POA: Diagnosis not present

## 2018-02-09 DIAGNOSIS — I2510 Atherosclerotic heart disease of native coronary artery without angina pectoris: Secondary | ICD-10-CM | POA: Diagnosis not present

## 2018-03-16 DIAGNOSIS — Z6838 Body mass index (BMI) 38.0-38.9, adult: Secondary | ICD-10-CM | POA: Diagnosis not present

## 2018-03-16 DIAGNOSIS — M545 Low back pain: Secondary | ICD-10-CM | POA: Diagnosis not present

## 2018-03-16 DIAGNOSIS — M62830 Muscle spasm of back: Secondary | ICD-10-CM | POA: Diagnosis not present

## 2018-03-16 DIAGNOSIS — I10 Essential (primary) hypertension: Secondary | ICD-10-CM | POA: Diagnosis not present

## 2018-03-16 DIAGNOSIS — N200 Calculus of kidney: Secondary | ICD-10-CM | POA: Diagnosis not present

## 2018-04-24 DIAGNOSIS — M25461 Effusion, right knee: Secondary | ICD-10-CM | POA: Diagnosis not present

## 2018-04-24 DIAGNOSIS — S40261A Insect bite (nonvenomous) of right shoulder, initial encounter: Secondary | ICD-10-CM | POA: Diagnosis not present

## 2018-04-24 DIAGNOSIS — M1711 Unilateral primary osteoarthritis, right knee: Secondary | ICD-10-CM | POA: Diagnosis not present

## 2018-04-24 DIAGNOSIS — Z6838 Body mass index (BMI) 38.0-38.9, adult: Secondary | ICD-10-CM | POA: Diagnosis not present

## 2018-05-16 DIAGNOSIS — M1711 Unilateral primary osteoarthritis, right knee: Secondary | ICD-10-CM | POA: Diagnosis not present

## 2018-05-16 DIAGNOSIS — I10 Essential (primary) hypertension: Secondary | ICD-10-CM | POA: Diagnosis not present

## 2018-05-16 DIAGNOSIS — I209 Angina pectoris, unspecified: Secondary | ICD-10-CM | POA: Diagnosis not present

## 2018-05-16 DIAGNOSIS — R140 Abdominal distension (gaseous): Secondary | ICD-10-CM | POA: Diagnosis not present

## 2018-05-16 DIAGNOSIS — Z6838 Body mass index (BMI) 38.0-38.9, adult: Secondary | ICD-10-CM | POA: Diagnosis not present

## 2018-05-16 DIAGNOSIS — I2510 Atherosclerotic heart disease of native coronary artery without angina pectoris: Secondary | ICD-10-CM | POA: Diagnosis not present

## 2018-08-01 DIAGNOSIS — R51 Headache: Secondary | ICD-10-CM | POA: Diagnosis not present

## 2018-08-03 DIAGNOSIS — I10 Essential (primary) hypertension: Secondary | ICD-10-CM | POA: Diagnosis not present

## 2018-08-03 DIAGNOSIS — I2510 Atherosclerotic heart disease of native coronary artery without angina pectoris: Secondary | ICD-10-CM | POA: Diagnosis not present

## 2018-08-03 DIAGNOSIS — Z6837 Body mass index (BMI) 37.0-37.9, adult: Secondary | ICD-10-CM | POA: Diagnosis not present

## 2018-08-03 DIAGNOSIS — R51 Headache: Secondary | ICD-10-CM | POA: Diagnosis not present

## 2018-08-14 DIAGNOSIS — Z6836 Body mass index (BMI) 36.0-36.9, adult: Secondary | ICD-10-CM | POA: Diagnosis not present

## 2018-08-14 DIAGNOSIS — N3941 Urge incontinence: Secondary | ICD-10-CM | POA: Diagnosis not present

## 2018-08-14 DIAGNOSIS — E782 Mixed hyperlipidemia: Secondary | ICD-10-CM | POA: Diagnosis not present

## 2018-08-14 DIAGNOSIS — I2510 Atherosclerotic heart disease of native coronary artery without angina pectoris: Secondary | ICD-10-CM | POA: Diagnosis not present

## 2018-08-14 DIAGNOSIS — N200 Calculus of kidney: Secondary | ICD-10-CM | POA: Diagnosis not present

## 2018-08-14 DIAGNOSIS — M1711 Unilateral primary osteoarthritis, right knee: Secondary | ICD-10-CM | POA: Diagnosis not present

## 2018-08-14 DIAGNOSIS — Z23 Encounter for immunization: Secondary | ICD-10-CM | POA: Diagnosis not present

## 2018-08-14 DIAGNOSIS — I10 Essential (primary) hypertension: Secondary | ICD-10-CM | POA: Diagnosis not present

## 2018-08-14 DIAGNOSIS — N401 Enlarged prostate with lower urinary tract symptoms: Secondary | ICD-10-CM | POA: Diagnosis not present

## 2018-11-08 DIAGNOSIS — I10 Essential (primary) hypertension: Secondary | ICD-10-CM | POA: Diagnosis not present

## 2018-11-08 DIAGNOSIS — R946 Abnormal results of thyroid function studies: Secondary | ICD-10-CM | POA: Diagnosis not present

## 2018-11-08 DIAGNOSIS — E782 Mixed hyperlipidemia: Secondary | ICD-10-CM | POA: Diagnosis not present

## 2018-11-13 DIAGNOSIS — N401 Enlarged prostate with lower urinary tract symptoms: Secondary | ICD-10-CM | POA: Diagnosis not present

## 2018-11-13 DIAGNOSIS — N3941 Urge incontinence: Secondary | ICD-10-CM | POA: Diagnosis not present

## 2018-11-13 DIAGNOSIS — M1991 Primary osteoarthritis, unspecified site: Secondary | ICD-10-CM | POA: Diagnosis not present

## 2018-11-13 DIAGNOSIS — I10 Essential (primary) hypertension: Secondary | ICD-10-CM | POA: Diagnosis not present

## 2018-11-13 DIAGNOSIS — Z6837 Body mass index (BMI) 37.0-37.9, adult: Secondary | ICD-10-CM | POA: Diagnosis not present

## 2018-11-13 DIAGNOSIS — N200 Calculus of kidney: Secondary | ICD-10-CM | POA: Diagnosis not present

## 2018-11-13 DIAGNOSIS — R6881 Early satiety: Secondary | ICD-10-CM | POA: Diagnosis not present

## 2018-11-13 DIAGNOSIS — I2510 Atherosclerotic heart disease of native coronary artery without angina pectoris: Secondary | ICD-10-CM | POA: Diagnosis not present

## 2018-11-28 DIAGNOSIS — R1110 Vomiting, unspecified: Secondary | ICD-10-CM | POA: Diagnosis not present

## 2018-11-28 DIAGNOSIS — N323 Diverticulum of bladder: Secondary | ICD-10-CM | POA: Diagnosis not present

## 2018-11-28 DIAGNOSIS — Q631 Lobulated, fused and horseshoe kidney: Secondary | ICD-10-CM | POA: Diagnosis not present

## 2018-11-28 DIAGNOSIS — R6881 Early satiety: Secondary | ICD-10-CM | POA: Diagnosis not present

## 2018-11-28 DIAGNOSIS — I700 Atherosclerosis of aorta: Secondary | ICD-10-CM | POA: Diagnosis not present

## 2018-12-12 ENCOUNTER — Ambulatory Visit: Payer: PPO | Admitting: Urology

## 2018-12-12 DIAGNOSIS — R3915 Urgency of urination: Secondary | ICD-10-CM | POA: Diagnosis not present

## 2018-12-12 DIAGNOSIS — R350 Frequency of micturition: Secondary | ICD-10-CM

## 2018-12-12 DIAGNOSIS — N200 Calculus of kidney: Secondary | ICD-10-CM

## 2019-01-15 ENCOUNTER — Encounter (INDEPENDENT_AMBULATORY_CARE_PROVIDER_SITE_OTHER): Payer: Self-pay

## 2019-01-15 ENCOUNTER — Encounter: Payer: Self-pay | Admitting: Cardiovascular Disease

## 2019-01-15 ENCOUNTER — Ambulatory Visit: Payer: PPO | Admitting: Cardiovascular Disease

## 2019-01-15 VITALS — BP 104/78 | HR 58 | Ht 69.0 in | Wt 249.4 lb

## 2019-01-15 DIAGNOSIS — E782 Mixed hyperlipidemia: Secondary | ICD-10-CM

## 2019-01-15 DIAGNOSIS — I25118 Atherosclerotic heart disease of native coronary artery with other forms of angina pectoris: Secondary | ICD-10-CM | POA: Diagnosis not present

## 2019-01-15 DIAGNOSIS — I10 Essential (primary) hypertension: Secondary | ICD-10-CM

## 2019-01-15 LAB — CBC WITH DIFFERENTIAL/PLATELET
BASOS: 1 %
Basophils Absolute: 0.1 10*3/uL (ref 0.0–0.2)
EOS (ABSOLUTE): 0.1 10*3/uL (ref 0.0–0.4)
EOS: 2 %
HEMATOCRIT: 42.3 % (ref 37.5–51.0)
Hemoglobin: 14.6 g/dL (ref 13.0–17.7)
IMMATURE GRANS (ABS): 0 10*3/uL (ref 0.0–0.1)
IMMATURE GRANULOCYTES: 0 %
LYMPHS: 25 %
Lymphocytes Absolute: 1.7 10*3/uL (ref 0.7–3.1)
MCH: 30.2 pg (ref 26.6–33.0)
MCHC: 34.5 g/dL (ref 31.5–35.7)
MCV: 88 fL (ref 79–97)
MONOS ABS: 0.9 10*3/uL (ref 0.1–0.9)
Monocytes: 13 %
Neutrophils Absolute: 4.1 10*3/uL (ref 1.4–7.0)
Neutrophils: 59 %
Platelets: 229 10*3/uL (ref 150–450)
RBC: 4.83 x10E6/uL (ref 4.14–5.80)
RDW: 12.9 % (ref 11.6–15.4)
WBC: 6.8 10*3/uL (ref 3.4–10.8)

## 2019-01-15 LAB — BASIC METABOLIC PANEL
BUN/Creatinine Ratio: 15 (ref 10–24)
BUN: 11 mg/dL (ref 8–27)
CO2: 20 mmol/L (ref 20–29)
CREATININE: 0.74 mg/dL — AB (ref 0.76–1.27)
Calcium: 8.9 mg/dL (ref 8.6–10.2)
Chloride: 100 mmol/L (ref 96–106)
GFR calc Af Amer: 110 mL/min/{1.73_m2} (ref >59)
GFR, EST NON AFRICAN AMERICAN: 95 mL/min/{1.73_m2} (ref >59)
Glucose: 103 mg/dL — ABNORMAL HIGH (ref 65–99)
Potassium: 4.6 mmol/L (ref 3.5–5.2)
SODIUM: 135 mmol/L (ref 134–144)

## 2019-01-15 MED ORDER — PANTOPRAZOLE SODIUM 40 MG PO TBEC: 40.0000 mg | DELAYED_RELEASE_TABLET | Freq: Every day | ORAL | 11 refills | Status: DC

## 2019-01-15 NOTE — Patient Instructions (Addendum)
Medication Instructions:  1) STOP OMEPRAZOLE 2) START PANTOPRAZOLE 40 mg daily  Labwork: TODAY: BMET, CBC  Testing/Procedures: Your physician has requested that you have a cardiac catheterization. Cardiac catheterization is used to diagnose and/or treat various heart conditions. Doctors may recommend this procedure for a number of different reasons. The most common reason is to evaluate chest pain. Chest pain can be a symptom of coronary artery disease (CAD), and cardiac catheterization can show whether plaque is narrowing or blocking your heart's arteries. This procedure is also used to evaluate the valves, as well as measure the blood flow and oxygen levels in different parts of your heart. For further information please visit [email or web address]. Please follow instruction sheet, as given.  Follow-Up: Your provider recommends that you schedule a follow-up appointment AS NEEDED pending cath results.   Any Other Special Instructions Will Be Listed Below (If Applicable)  CATHETERIZATION INSTRUCTIONS:  You are scheduled for a Cardiac Catheterization on Wednesday, March 11 with Dr. Sherren Mocha.  1. Please arrive at the [facility] (Main Entrance A) at [facility]: [address] [place], [place] [address] at 8:00 AM (This time is two hours before your procedure to ensure your preparation). Free valet parking service is available.   Special note: Every effort is made to have your procedure done on time. Please understand that emergencies sometimes delay scheduled procedures.  2. Diet: Do not eat solid foods after midnight.  The patient may have clear liquids until 5am upon the day of the procedure.  3. Labs: TODAY!  4. Medication instructions in preparation for your procedure:  1) MAKE SURE TO TAKE YOUR ASPIRIN AND PLAVIX the morning of your cath.  2) you may take your other medications as directed with sips of water   5. Plan for one night stay--bring personal belongings. 6.  Bring a current list of your medications and current insurance cards. 7. You MUST have a responsible person to drive you home. 8. Someone MUST be with you the first 24 hours after you arrive home or your discharge will be delayed. 9. Please wear clothes that are easy to get on and off and wear slip-on shoes.  Thank you for allowing [place] to care for you!   -- [facility]

## 2019-01-15 NOTE — Progress Notes (Signed)
Cardiology Office Note:    Date:  01/15/2019   ID:  Kyle Lambert, Kyle Lambert [birth year], MRN [id number]  PCP:  Curlene Labrum, MD  Cardiologist:  Sherren Mocha, MD  Electrophysiologist:  None   Referring MD: Curlene Labrum, MD   Chief Complaint  Patient presents with  . Coronary Artery Disease  . Chest Pain   History of Present Illness:    Kyle Lambert is a 68 y.o. male with a hx of coronary artery disease, presenting for follow-up evaluation.  The patient was treated with drug-eluting stenting of the RCA and LAD in 2013 in 2014, respectively.  Other medical conditions include mixed hyperlipidemia and hypertension.  In 2018 he had some problems with rectal bleeding and his antiplatelet therapy had to be held.  His hemorrhoids were treated and he has had no further rectal bleeding in the past year.  The patient is here with his wife today.  He reports progressive symptoms of chest discomfort and exercise intolerance.  Symptoms have been present for almost a year.  He is not able to do a lot of the things he generally likes to do.  He complains of a pressure-like sensation in his central chest.  At times he has had radiation to the jaw and left arm.  His symptoms have been stable over the last 6 months.  Symptoms resolved with rest.  He has not required sublingual nitroglycerin.  He complains of constant headache.  Denies lightheadedness or frank syncope.  Denies heart palpitations.  He is compliant with his medications.  Past Medical History:  Diagnosis Date  . Arthritis   . CAD (coronary artery disease)    a. 03/2012 Cath: LAD 80, RCA 80, nl EF;  b. PCI of RCA w 3.0x16mm Promus DES, FFR of LAD was nl @ 0.85 ->Med Rx.;  c. LHC 02/15/13: proximal LAD 40-50%, mid LAD 95%, small D1 95-99%, mid CFX 30-40%, proximal RCA stent patent with ostial 20-30% prior to the stent, ostial PDA 50%, EF 55-65%. PCI:  Promus DES to the mid LAD and POBA to the proximal D1   . Hyperlipidemia   . Hypertension     . Obesity     Past Surgical History:  Procedure Laterality Date  . CORONARY ANGIOPLASTY WITH STENT PLACEMENT  04/24/12   DES-RCA  . CORONARY ANGIOPLASTY WITH STENT PLACEMENT  02/15/13   Patent RCA stent, diffuse nonobstructive R PDA, LCx disease, 99% mid LAD stenosis s/p DES-mid LAD; LVEF 60%  . CORONARY STENT PLACEMENT    . HERNIA REPAIR Left inguinal  . LEFT HEART CATHETERIZATION WITH CORONARY ANGIOGRAM N/A 04/21/2012   Procedure: LEFT HEART CATHETERIZATION WITH CORONARY ANGIOGRAM;  Surgeon: Hillary Bow, MD;  Location: [facility] CATH LAB;  Service: Cardiovascular;  Laterality: N/A;  . LEFT HEART CATHETERIZATION WITH CORONARY ANGIOGRAM N/A 02/15/2013   Procedure: LEFT HEART CATHETERIZATION WITH CORONARY ANGIOGRAM;  Surgeon: Sherren Mocha, MD;  Location: [facility] CATH LAB;  Service: Cardiovascular;  Laterality: N/A;  . LITHOTRIPSY    . PERCUTANEOUS CORONARY STENT INTERVENTION (PCI-S) N/A 04/24/2012   Procedure: PERCUTANEOUS CORONARY STENT INTERVENTION (PCI-S);  Surgeon: Sherren Mocha, MD;  Location: [facility] CATH LAB;  Service: Cardiovascular;  Laterality: N/A;  . PERCUTANEOUS CORONARY STENT INTERVENTION (PCI-S)  02/15/2013   Procedure: PERCUTANEOUS CORONARY STENT INTERVENTION (PCI-S);  Surgeon: Sherren Mocha, MD;  Location: [facility] CATH LAB;  Service: Cardiovascular;;    Current Medications: Current Meds  Medication Sig  . acetaminophen (TYLENOL) 500 MG tablet Take 500-1,000 mg by  mouth every 6 (six) hours as needed for mild pain or moderate pain.  Marland Kitchen amLODipine (NORVASC) 5 MG tablet TAKE 1 TABLET DAILY  . aspirin EC 81 MG tablet Take 81 mg by mouth daily.  . ciprofloxacin (CIPRO) 500 MG tablet Take 500 mg by mouth 2 (two) times daily.  . clopidogrel (PLAVIX) 75 MG tablet TAKE 1 TABLET (75 MG TOTAL) DAILY WITH BREAKFAST.  . cyclobenzaprine (FLEXERIL) 10 MG tablet Take 10 mg by mouth 3 (three) times daily as needed. for muscle spams  . Docusate Calcium (STOOL SOFTENER PO) TAKE 50 MG TABLET BY MOUTH DAILY  (OTC MEDICATION)   . HYDROcodone-acetaminophen (NORCO) 10-325 MG tablet Take 1 tablet by mouth every 6 (six) hours as needed.  . isosorbide mononitrate (IMDUR) 120 MG 24 hr tablet Take 1 tablet (120 mg total) by mouth daily.  Marland Kitchen lisinopril (PRINIVIL,ZESTRIL) 20 MG tablet Take 20 mg daily by mouth.  . metoprolol tartrate (LOPRESSOR) 25 MG tablet TAKE 1 TABLET TWICE A DAY  . nitroGLYCERIN (NITROSTAT) 0.4 MG SL tablet Place 1 tablet (0.4 mg total) under the tongue every 5 (five) minutes x 3 doses as needed for chest pain.  . simvastatin (ZOCOR) 10 MG tablet Take 20 mg by mouth daily.   . [DISCONTINUED] omeprazole (PRILOSEC) 40 MG capsule Take 1 capsule by mouth daily.     Allergies:   Patient has no known allergies.   Social History   Socioeconomic History  . Marital status: Married    Spouse name: Not on file  . Number of children: Not on file  . Years of education: Not on file  . Highest education level: Not on file  Occupational History  . Not on file  Social Needs  . Financial resource strain: Not on file  . Food insecurity:    Worry: Not on file    Inability: Not on file  . Transportation needs:    Medical: Not on file    Non-medical: Not on file  Tobacco Use  . Smoking status: Never Smoker  . Smokeless tobacco: Never Used  Substance and Sexual Activity  . Alcohol use: No    Comment: " quit along time ago"  . Drug use: No  . Sexual activity: Not Currently  Lifestyle  . Physical activity:    Days per week: Not on file    Minutes per session: Not on file  . Stress: Not on file  Relationships  . Social connections:    Talks on phone: Not on file    Gets together: Not on file    Attends religious service: Not on file    Active member of club or organization: Not on file    Attends meetings of clubs or organizations: Not on file    Relationship status: Not on file  Other Topics Concern  . Not on file  Social History Narrative  . Not on file     Family  History: The patient's family history is not on file.  ROS:   Please see the history of present illness.    Positive for chest pain, dyspnea, visual disturbance, depression, back pain, dizziness, fatigue, snoring, anxiety, headaches.  All other systems reviewed and are negative.  EKGs/Labs/Other Studies Reviewed:    The following studies were reviewed today: Myoview stress test 08-30-2017: Study Highlights    Nuclear stress EF: 67%.  There was no ST segment deviation noted during stress.  There is a small defect of mild severity present in the basal inferoseptal  location. The defect is non-reversible and consistent with diaphragmatic attenuation artifact. No ischemia noted.  The left ventricular ejection fraction is hyperdynamic (>65%).  This is a low risk study.     Cardiac catheterization 02/15/2013: PROCEDURAL FINDINGS Hemodynamics: AO 88/52 LV 89/7              Coronary angiography: Coronary dominance: right  Left mainstem: The left mainstem is short. There is no significant stenosis present.  Left anterior descending (LAD): The LAD is patent. The vessel has moderate proximal calcification. The mid vessel has a 95% stenosis. This is a focal napkin ring type stenosis. The distal vessel is diffusely diseased with severe stenosis near the apex. The proximal LAD has 40-50% stenosis. There is a small first diagonal branch with 95-99% stenosis.  Left circumflex (LCx): The left circumflex is patent. The vessel supplies 2 major OM branches. The first OM gives off multiple subbranches. The proximal circumflex has nonobstructive stenosis. The mid circumflex as it enters the OM has diffuse 30-40% stenosis  Right coronary artery (RCA): The RCA is a large, dominant vessel. The stented segment in the proximal vessel is widely patent. There is minor ostial stenosis before the stent. This is no more than 20-30% stenosis. The mid and distal vessel are patent. The origin of the PDA branch  has 50% stenosis. This is unchanged from the previous study.  Left ventriculography: Left ventricular systolic function is normal, LVEF is estimated at 55-65%, there is no significant mitral regurgitation   PCI Note:  Following the diagnostic procedure, the decision was made to proceed with PCI. The patient has progressive, severe stenosis in the mid LAD. This lesion is focal and I thought it was amenable to PCI. There is also high-grade diagonal stenosis involving a small vessel. I plan on approaching this with plain balloon angioplasty. The radial sheath was upsized to a 6 [place]. Weight-based unfractionated heparin was given for anticoagulation. The patient has been maintained on long-term aspirin and Plavix. Once a therapeutic ACT was achieved, a 6 [place] XB LAD guide catheter was inserted.  A pro-water coronary guidewire was used to cross the lesion in the mid LAD.  The lesion was predilated with a 2.0 mm balloon.  The lesion was then stented with a 2.25 x 12 mm Promus premier drug-eluting stent.  The stent was postdilated with a 2.25 mm noncompliant balloon.  Following PCI, there was 0% residual stenosis and TIMI-3 flow. Attention was then turned to the first diagonal branch. I initially tried to wire the vessel with a prolonged her wire. This was unsuccessful. A whisper wire was utilized successfully. The vessel was dilated with a 1.5 x 10 mm balloon to 10 atmospheres for 60 seconds and again to 12 atmospheres for about 45 seconds. This improved the percent stenosis from 99 down to 40% post PTCA. I felt this was the best we could do in a small vessel. The vessel was not amenable to stenting. Final angiography confirmed an excellent result. The patient tolerated the procedure well. There were no immediate procedural complications. A TR band was used for radial hemostasis. The patient was transferred to the post catheterization recovery area for further monitoring.  PCI Data: Lesion 1: Vessel -  LAD/Segment - mid Percent Stenosis (pre)  90 TIMI-flow 3 Stent 2.25 x 12 mm drug-eluting Percent Stenosis (post) 0 TIMI-flow (post) 3  Lesion 2: Vessel - diagonal 1/Segment - proximal Percent Stenosis (pre)  99 TIMI-flow 3 Stent none Percent Stenosis (post) 40 TIMI-flow (post)  3  Final Conclusions:   1. Continued patency of the stented segment in the right coronary artery with nonobstructive stenosis in the right PDA 2. Diffuse nonobstructive left circumflex stenosis 3. Severe mid LAD stenosis with successful PCI and severe diagonal stenosis treated successfully with balloon angioplasty 4. Preserved left ventricular systolic function with an estimated left ventricular ejection fraction of 60%   Recommendations:  Continue dual antiplatelet therapy and medical treatment for CAD.  EKG:  EKG is ordered today.  The ekg ordered today demonstrates sinus bradycardia 58 bpm, RV conduction delay  Recent Labs: No results found for requested labs within last 8760 hours.  Recent Lipid Panel No results found for: CHOL, TRIG, HDL, CHOLHDL, VLDL, LDLCALC, LDLDIRECT  Physical Exam:    VS:  BP 104/78   Pulse (!) 58   Ht 5\' 9"  (1.753 m)   Wt 249 lb 6.4 oz (113.1 kg)   SpO2 93%   BMI 36.83 kg/m     Wt Readings from Last 3 Encounters:  01/15/19 249 lb 6.4 oz (113.1 kg)  10/06/17 249 lb 12 oz (113.3 kg)  09/03/17 249 lb (112.9 kg)    GEN:  Obese male in no acute distress HEENT: Normal NECK: No JVD; No carotid bruits LYMPHATICS: No lymphadenopathy CARDIAC: RRR, no murmurs, rubs, gallops RESPIRATORY:  Clear to auscultation without rales, wheezing or rhonchi  ABDOMEN: Soft, non-tender, non-distended MUSCULOSKELETAL:  No edema; No deformity  SKIN: Warm and dry NEUROLOGIC:  Alert and oriented x 3 PSYCHIATRIC:  Normal affect   ASSESSMENT:    1. Coronary artery disease of native artery of native heart with stable angina pectoris ([place])   2. Essential hypertension   3. Mixed  hyperlipidemia    PLAN:    In order of problems listed above:  1. The patient reports CCS class III symptoms of exertional angina, dyspnea, and fatigue.  He is on a maximal medical program that includes metoprolol, high-dose isosorbide, and amlodipine for antianginal therapy.  He is tolerating dual antiplatelet therapy with aspirin and clopidogrel.  I think cardiac catheterization and possible PCI are indicated. I have reviewed the risks, indications, and alternatives to cardiac catheterization, possible angioplasty, and stenting with the patient. Risks include but are not limited to bleeding, infection, vascular injury, stroke, myocardial infection, arrhythmia, kidney injury, radiation-related injury in the case of prolonged fluoroscopy use, emergency cardiac surgery, and death. The patient understands the risks of serious complication is 1-2 in 3976 with diagnostic cardiac cath and 1-2% or less with angioplasty/stenting.  He understands to call EMS if symptoms progress or he develops prolonged rest pain.  He understands how to use sublingual nitroglycerin as needed. 2. Blood pressure is well controlled on his current regimen. 3. Most recent lipids are reviewed.  He is treated with a high intensity statin drug.   Medication Adjustments/Labs and Tests Ordered: Current medicines are reviewed at length with the patient today.  Concerns regarding medicines are outlined above.  Orders Placed This Encounter  Procedures  . Basic metabolic panel  . CBC with Differential/Platelet  . EKG 12-Lead   Meds ordered this encounter  Medications  . pantoprazole (PROTONIX) 40 MG tablet    Sig: Take 1 tablet (40 mg total) by mouth daily.    Dispense:  30 tablet    Refill:  11    Patient Instructions  Medication Instructions:  1) STOP OMEPRAZOLE 2) START PANTOPRAZOLE 40 mg daily  Labwork: TODAY: BMET, CBC  Testing/Procedures: Your physician has requested that you have  a cardiac catheterization.  Cardiac catheterization is used to diagnose and/or treat various heart conditions. Doctors may recommend this procedure for a number of different reasons. The most common reason is to evaluate chest pain. Chest pain can be a symptom of coronary artery disease (CAD), and cardiac catheterization can show whether plaque is narrowing or blocking your heart's arteries. This procedure is also used to evaluate the valves, as well as measure the blood flow and oxygen levels in different parts of your heart. For further information please visit [email or web address]. Please follow instruction sheet, as given.  Follow-Up: Your provider recommends that you schedule a follow-up appointment AS NEEDED pending cath results.   Any Other Special Instructions Will Be Listed Below (If Applicable)  CATHETERIZATION INSTRUCTIONS:  You are scheduled for a Cardiac Catheterization on Wednesday, March 11 with Dr. Sherren Mocha.  1. Please arrive at the [facility] (Main Entrance A) at [facility]: [address] [place], [place] [address] at 8:00 AM (This time is two hours before your procedure to ensure your preparation). Free valet parking service is available.   Special note: Every effort is made to have your procedure done on time. Please understand that emergencies sometimes delay scheduled procedures.  2. Diet: Do not eat solid foods after midnight.  The patient may have clear liquids until 5am upon the day of the procedure.  3. Labs: TODAY!  4. Medication instructions in preparation for your procedure:  1) MAKE SURE TO TAKE YOUR ASPIRIN AND PLAVIX the morning of your cath.  2) you may take your other medications as directed with sips of water   5. Plan for one night stay--bring personal belongings. 6. Bring a current list of your medications and current insurance cards. 7. You MUST have a responsible person to drive you home. 8. Someone MUST be with you the first 24 hours after you arrive home or your  discharge will be delayed. 9. Please wear clothes that are easy to get on and off and wear slip-on shoes.  Thank you for allowing [place] to care for you!   -- [facility] Cardi[facility] services     Signed, Sherren Mocha, MD  01/15/2019 12:19 PM    [place]

## 2019-01-15 NOTE — H&P (View-Only) (Signed)
Cardiology Office Note:    Date:  01/15/2019   ID:  Kyle, Lambert [birth year], MRN [id number]  PCP:  Curlene Labrum, MD  Cardiologist:  Sherren Mocha, MD  Electrophysiologist:  None   Referring MD: Curlene Labrum, MD   Chief Complaint  Patient presents with   Coronary Artery Disease   Chest Pain   History of Present Illness:    Kyle Lambert is a 68 y.o. male with a hx of coronary artery disease, presenting for follow-up evaluation.  The patient was treated with drug-eluting stenting of the RCA and LAD in 2013 in 2014, respectively.  Other medical conditions include mixed hyperlipidemia and hypertension.  In 2018 he had some problems with rectal bleeding and his antiplatelet therapy had to be held.  His hemorrhoids were treated and he has had no further rectal bleeding in the past year.  The patient is here with his wife today.  He reports progressive symptoms of chest discomfort and exercise intolerance.  Symptoms have been present for almost a year.  He is not able to do a lot of the things he generally likes to do.  He complains of a pressure-like sensation in his central chest.  At times he has had radiation to the jaw and left arm.  His symptoms have been stable over the last 6 months.  Symptoms resolved with rest.  He has not required sublingual nitroglycerin.  He complains of constant headache.  Denies lightheadedness or frank syncope.  Denies heart palpitations.  He is compliant with his medications.  Past Medical History:  Diagnosis Date   Arthritis    CAD (coronary artery disease)    a. 03/2012 Cath: LAD 80, RCA 80, nl EF;  b. PCI of RCA w 3.0x22mm Promus DES, FFR of LAD was nl @ 0.85 ->Med Rx.;  c. LHC 02/15/13: proximal LAD 40-50%, mid LAD 95%, small D1 95-99%, mid CFX 30-40%, proximal RCA stent patent with ostial 20-30% prior to the stent, ostial PDA 50%, EF 55-65%. PCI:  Promus DES to the mid LAD and POBA to the proximal D1    Hyperlipidemia    Hypertension      Obesity     Past Surgical History:  Procedure Laterality Date   CORONARY ANGIOPLASTY WITH STENT PLACEMENT  04/24/12   DES-RCA   CORONARY ANGIOPLASTY WITH STENT PLACEMENT  02/15/13   Patent RCA stent, diffuse nonobstructive R PDA, LCx disease, 99% mid LAD stenosis s/p DES-mid LAD; LVEF 60%   CORONARY STENT PLACEMENT     HERNIA REPAIR Left inguinal   LEFT HEART CATHETERIZATION WITH CORONARY ANGIOGRAM N/A 04/21/2012   Procedure: LEFT HEART CATHETERIZATION WITH CORONARY ANGIOGRAM;  Surgeon: Hillary Bow, MD;  Location: [facility] C[facility] LAB;  Service: Cardiovascular;  Laterality: N/A;   LEFT HEART CATHETERIZATION WITH CORONARY ANGIOGRAM N/A 02/15/2013   Procedure: LEFT HEART CATHETERIZATION WITH CORONARY ANGIOGRAM;  Surgeon: Sherren Mocha, MD;  Location: [facility] CATH LAB;  Service: Cardiovascular;  Laterality: N/A;   LITHOTRIPSY     PERCUTANEOUS CORONARY STENT INTERVENTION (PCI-S) N/A 04/24/2012   Procedure: PERCUTANEOUS CORONARY STENT INTERVENTION (PCI-S);  Surgeon: Sherren Mocha, MD;  Location: [facility] CATH LAB;  Service: Cardiovascular;  Laterality: N/A;   PERCUTANEOUS CORONARY STENT INTERVENTION (PCI-S)  02/15/2013   Procedure: PERCUTANEOUS CORONARY STENT INTERVENTION (PCI-S);  Surgeon: Sherren Mocha, MD;  Location: [facility] CATH LAB;  Service: Cardiovascular;;    Current Medications: Current Meds  Medication Sig   acetaminophen (TYLENOL) 500 MG tablet Take 500-1,000 mg by  mouth every 6 (six) hours as needed for mild pain or moderate pain.   amLODipine (NORVASC) 5 MG tablet TAKE 1 TABLET DAILY   aspirin EC 81 MG tablet Take 81 mg by mouth daily.   ciprofloxacin (CIPRO) 500 MG tablet Take 500 mg by mouth 2 (two) times daily.   clopidogrel (PLAVIX) 75 MG tablet TAKE 1 TABLET (75 MG TOTAL) DAILY WITH BREAKFAST.   cyclobenzaprine (FLEXERIL) 10 MG tablet Take 10 mg by mouth 3 (three) times daily as needed. for muscle spams   Docusate Calcium (STOOL SOFTENER PO) TAKE 50 MG TABLET BY MOUTH DAILY  (OTC MEDICATION)    HYDROcodone-acetaminophen (NORCO) 10-325 MG tablet Take 1 tablet by mouth every 6 (six) hours as needed.   isosorbide mononitrate (IMDUR) 120 MG 24 hr tablet Take 1 tablet (120 mg total) by mouth daily.   lisinopril (PRINIVIL,ZESTRIL) 20 MG tablet Take 20 mg daily by mouth.   metoprolol tartrate (LOPRESSOR) 25 MG tablet TAKE 1 TABLET TWICE A DAY   nitroGLYCERIN (NITROSTAT) 0.4 MG SL tablet Place 1 tablet (0.4 mg total) under the tongue every 5 (five) minutes x 3 doses as needed for chest pain.   simvastatin (ZOCOR) 10 MG tablet Take 20 mg by mouth daily.    [DISCONTINUED] omeprazole (PRILOSEC) 40 MG capsule Take 1 capsule by mouth daily.     Allergies:   Patient has no known allergies.   Social History   Socioeconomic History   Marital status: Married    Spouse name: Not on file   Number of children: Not on file   Years of education: Not on file   Highest education level: Not on file  Occupational History   Not on file  Social Needs   Financial resource strain: Not on file   Food insecurity:    Worry: Not on file    Inability: Not on file   Transportation needs:    Medical: Not on file    Non-medical: Not on file  Tobacco Use   Smoking status: Never Smoker   Smokeless tobacco: Never Used  Substance and Sexual Activity   Alcohol use: No    Comment: " quit along time ago"   Drug use: No   Sexual activity: Not Currently  Lifestyle   Physical activity:    Days per week: Not on file    Minutes per session: Not on file   Stress: Not on file  Relationships   Social connections:    Talks on phone: Not on file    Gets together: Not on file    Attends religious service: Not on file    Active member of club or organization: Not on file    Attends meetings of clubs or organizations: Not on file    Relationship status: Not on file  Other Topics Concern   Not on file  Social History Narrative   Not on file     Family  History: The patient's family history is not on file.  ROS:   Please see the history of present illness.    Positive for chest pain, dyspnea, visual disturbance, depression, back pain, dizziness, fatigue, snoring, anxiety, headaches.  All other systems reviewed and are negative.  EKGs/Labs/Other Studies Reviewed:    The following studies were reviewed today: Myoview stress test 08-30-2017: Study Highlights    Nuclear stress EF: 67%.  There was no ST segment deviation noted during stress.  There is a small defect of mild severity present in the basal inferoseptal  location. The defect is non-reversible and consistent with diaphragmatic attenuation artifact. No ischemia noted.  The left ventricular ejection fraction is hyperdynamic (>65%).  This is a low risk study.     Cardiac catheterization 02/15/2013: PROCEDURAL FINDINGS Hemodynamics: AO 88/52 LV 89/7              Coronary angiography: Coronary dominance: right  Left mainstem: The left mainstem is short. There is no significant stenosis present.  Left anterior descending (LAD): The LAD is patent. The vessel has moderate proximal calcification. The mid vessel has a 95% stenosis. This is a focal napkin ring type stenosis. The distal vessel is diffusely diseased with severe stenosis near the apex. The proximal LAD has 40-50% stenosis. There is a small first diagonal branch with 95-99% stenosis.  Left circumflex (LCx): The left circumflex is patent. The vessel supplies 2 major OM branches. The first OM gives off multiple subbranches. The proximal circumflex has nonobstructive stenosis. The mid circumflex as it enters the OM has diffuse 30-40% stenosis  Right coronary artery (RCA): The RCA is a large, dominant vessel. The stented segment in the proximal vessel is widely patent. There is minor ostial stenosis before the stent. This is no more than 20-30% stenosis. The mid and distal vessel are patent. The origin of the PDA branch  has 50% stenosis. This is unchanged from the previous study.  Left ventriculography: Left ventricular systolic function is normal, LVEF is estimated at 55-65%, there is no significant mitral regurgitation   PCI Note:  Following the diagnostic procedure, the decision was made to proceed with PCI. The patient has progressive, severe stenosis in the mid LAD. This lesion is focal and I thought it was amenable to PCI. There is also high-grade diagonal stenosis involving a small vessel. I plan on approaching this with plain balloon angioplasty. The radial sheath was upsized to a 6 [place]. Weight-based unfractionated heparin was given for anticoagulation. The patient has been maintained on long-term aspirin and Plavix. Once a therapeutic ACT was achieved, a 6 [place] XB LAD guide catheter was inserted.  A pro-water coronary guidewire was used to cross the lesion in the mid LAD.  The lesion was predilated with a 2.0 mm balloon.  The lesion was then stented with a 2.25 x 12 mm Promus premier drug-eluting stent.  The stent was postdilated with a 2.25 mm noncompliant balloon.  Following PCI, there was 0% residual stenosis and TIMI-3 flow. Attention was then turned to the first diagonal branch. I initially tried to wire the vessel with a prolonged her wire. This was unsuccessful. A whisper wire was utilized successfully. The vessel was dilated with a 1.5 x 10 mm balloon to 10 atmospheres for 60 seconds and again to 12 atmospheres for about 45 seconds. This improved the percent stenosis from 99 down to 40% post PTCA. I felt this was the best we could do in a small vessel. The vessel was not amenable to stenting. Final angiography confirmed an excellent result. The patient tolerated the procedure well. There were no immediate procedural complications. A TR band was used for radial hemostasis. The patient was transferred to the post catheterization recovery area for further monitoring.  PCI Data: Lesion 1: Vessel -  LAD/Segment - mid Percent Stenosis (pre)  90 TIMI-flow 3 Stent 2.25 x 12 mm drug-eluting Percent Stenosis (post) 0 TIMI-flow (post) 3  Lesion 2: Vessel - diagonal 1/Segment - proximal Percent Stenosis (pre)  99 TIMI-flow 3 Stent none Percent Stenosis (post) 40 TIMI-flow (post)  3  Final Conclusions:   1. Continued patency of the stented segment in the right coronary artery with nonobstructive stenosis in the right PDA 2. Diffuse nonobstructive left circumflex stenosis 3. Severe mid LAD stenosis with successful PCI and severe diagonal stenosis treated successfully with balloon angioplasty 4. Preserved left ventricular systolic function with an estimated left ventricular ejection fraction of 60%   Recommendations:  Continue dual antiplatelet therapy and medical treatment for CAD.  EKG:  EKG is ordered today.  The ekg ordered today demonstrates sinus bradycardia 58 bpm, RV conduction delay  Recent Labs: No results found for requested labs within last 8760 hours.  Recent Lipid Panel No results found for: CHOL, TRIG, HDL, CHOLHDL, VLDL, LDLCALC, LDLDIRECT  Physical Exam:    VS:  BP 104/78    Pulse (!) 58    Ht 5\' 9"  (1.753 m)    Wt 249 lb 6.4 oz (113.1 kg)    SpO2 93%    BMI 36.83 kg/m     Wt Readings from Last 3 Encounters:  01/15/19 249 lb 6.4 oz (113.1 kg)  10/06/17 249 lb 12 oz (113.3 kg)  09/03/17 249 lb (112.9 kg)    GEN:  Obese male in no acute distress HEENT: Normal NECK: No JVD; No carotid bruits LYMPHATICS: No lymphadenopathy CARDIAC: RRR, no murmurs, rubs, gallops RESPIRATORY:  Clear to auscultation without rales, wheezing or rhonchi  ABDOMEN: Soft, non-tender, non-distended MUSCULOSKELETAL:  No edema; No deformity  SKIN: Warm and dry NEUROLOGIC:  Alert and oriented x 3 PSYCHIATRIC:  Normal affect   ASSESSMENT:    1. Coronary artery disease of native artery of native heart with stable angina pectoris ([place])   2. Essential hypertension   3. Mixed  hyperlipidemia    PLAN:    In order of problems listed above:  1. The patient reports CCS class III symptoms of exertional angina, dyspnea, and fatigue.  He is on a maximal medical program that includes metoprolol, high-dose isosorbide, and amlodipine for antianginal therapy.  He is tolerating dual antiplatelet therapy with aspirin and clopidogrel.  I think cardiac catheterization and possible PCI are indicated. I have reviewed the risks, indications, and alternatives to cardiac catheterization, possible angioplasty, and stenting with the patient. Risks include but are not limited to bleeding, infection, vascular injury, stroke, myocardial infection, arrhythmia, kidney injury, radiation-related injury in the case of prolonged fluoroscopy use, emergency cardiac surgery, and death. The patient understands the risks of serious complication is 1-2 in [id number] with diagnostic cardiac cath and 1-2% or less with angioplasty/stenting.  He understands to call EMS if symptoms progress or he develops prolonged rest pain.  He understands how to use sublingual nitroglycerin as needed. 2. Blood pressure is well controlled on his current regimen. 3. Most recent lipids are reviewed.  He is treated with a high intensity statin drug.   Medication Adjustments/Labs and Tests Ordered: Current medicines are reviewed at length with the patient today.  Concerns regarding medicines are outlined above.  Orders Placed This Encounter  Procedures   Basic metabolic panel   CBC with Differential/Platelet   EKG 12-Lead   Meds ordered this encounter  Medications   pantoprazole (PROTONIX) 40 MG tablet    Sig: Take 1 tablet (40 mg total) by mouth daily.    Dispense:  30 tablet    Refill:  11    Patient Instructions  Medication Instructions:  1) STOP OMEPRAZOLE 2) START PANTOPRAZOLE 40 mg daily  Labwork: TODAY: BMET, CBC  Testing/Procedures: Your physician  has requested that you have a cardiac catheterization.  Cardiac catheterization is used to diagnose and/or treat various heart conditions. Doctors may recommend this procedure for a number of different reasons. The most common reason is to evaluate chest pain. Chest pain can be a symptom of coronary artery disease (CAD), and cardiac catheterization can show whether plaque is narrowing or blocking your hearts arteries. This procedure is also used to evaluate the valves, as well as measure the blood flow and oxygen levels in different parts of your heart. For further information please visit [employer]. Please follow instruction sheet, as given.  Follow-Up: Your provider recommends that you schedule a follow-up appointment AS NEEDED pending cath results.   Any Other Special Instructions Will Be Listed Below (If Applicable)  CATHETERIZATION INSTRUCTIONS:  You are scheduled for a Cardiac Catheterization on Wednesday, March 11 with Dr. Sherren Mocha.  1. Please arrive at the [facility] (Main Entrance A) at [facility]: [address] [place], [place] [address] at 8:00 AM (This time is two hours before your procedure to ensure your preparation). Free valet parking service is available.   Special note: Every effort is made to have your procedure done on time. Please understand that emergencies sometimes delay scheduled procedures.  2. Diet: Do not eat solid foods after midnight.  The patient may have clear liquids until 5am upon the day of the procedure.  3. Labs: TODAY!  4. Medication instructions in preparation for your procedure:  1) MAKE SURE TO TAKE YOUR ASPIRIN AND PLAVIX the morning of your cath.  2) you may take your other medications as directed with sips of water   5. Plan for one night stay--bring personal belongings. 6. Bring a current list of your medications and current insurance cards. 7. You MUST have a responsible person to drive you home. 8. Someone MUST be with you the first 24 hours after you arrive home or your  discharge will be delayed. 9. Please wear clothes that are easy to get on and off and wear slip-on shoes.  Thank you for allowing [place] to care for you!   -- [facility] Invasive Cardiovascular services     Signed, Sherren Mocha, MD  01/15/2019 12:19 PM    [place]

## 2019-01-23 ENCOUNTER — Ambulatory Visit: Payer: PPO | Admitting: Urology

## 2019-01-29 ENCOUNTER — Telehealth: Payer: Self-pay | Admitting: *Deleted

## 2019-01-29 NOTE — Telephone Encounter (Signed)
Pt contacted pre-catheterization scheduled at [facility] for: Wednesday January 31, 2019 10 AM Verified arrival time and place: [place] Entrance A at: 8 AM  No solid food after midnight prior to cath, clear liquids until 5 AM day of procedure. Contrast allergy: no  AM meds can be  taken pre-cath with sip of water including: ASA 81 mg Clopidogrel 75 mg  Confirmed patient has responsible person to drive home post procedure and observe 24 hours after arriving home: yes  Pt's wife states patient told by Dr Pleas Koch, PCP to stop simvastatin because it was interfering with his BP, was told to contact Dr Burt Knack for recommendation for cholesterol management. Pt's wife states patient is scheduled to have cholesterol checked at Dr Burdine's office soon.  Pt's wife states she has been giving patient one of her atorvastatin 40 mg because she felt he should be on something for cholesterol and is asking if okay with Dr Burt Knack for patient to continue atorvastatin. Pt's wife advised I will forward to Dr Burt Knack for review and recommendation.

## 2019-01-31 ENCOUNTER — Encounter (HOSPITAL_COMMUNITY)
Admission: RE | Disposition: A | Discharge status: Home / Self Care | Payer: Self-pay | Source: Home / Self Care | Attending: Cardiovascular Disease

## 2019-01-31 ENCOUNTER — Ambulatory Visit (HOSPITAL_COMMUNITY)
Admission: RE | Admit: 2019-01-31 | Discharge: 2019-01-31 | Disposition: A | Discharge status: Home / Self Care | Payer: PPO | Attending: Cardiovascular Disease | Admitting: Cardiovascular Disease

## 2019-01-31 ENCOUNTER — Other Ambulatory Visit: Payer: Self-pay

## 2019-01-31 DIAGNOSIS — M1990 Unspecified osteoarthritis, unspecified site: Secondary | ICD-10-CM | POA: Insufficient documentation

## 2019-01-31 DIAGNOSIS — Z79899 Other long term (current) drug therapy: Secondary | ICD-10-CM | POA: Diagnosis not present

## 2019-01-31 DIAGNOSIS — I10 Essential (primary) hypertension: Secondary | ICD-10-CM | POA: Diagnosis not present

## 2019-01-31 DIAGNOSIS — I25118 Atherosclerotic heart disease of native coronary artery with other forms of angina pectoris: Secondary | ICD-10-CM | POA: Diagnosis not present

## 2019-01-31 DIAGNOSIS — Z7982 Long term (current) use of aspirin: Secondary | ICD-10-CM | POA: Diagnosis not present

## 2019-01-31 DIAGNOSIS — Z7902 Long term (current) use of antithrombotics/antiplatelets: Secondary | ICD-10-CM | POA: Insufficient documentation

## 2019-01-31 DIAGNOSIS — E669 Obesity, unspecified: Secondary | ICD-10-CM | POA: Diagnosis not present

## 2019-01-31 DIAGNOSIS — I209 Angina pectoris, unspecified: Secondary | ICD-10-CM | POA: Diagnosis present

## 2019-01-31 DIAGNOSIS — E782 Mixed hyperlipidemia: Secondary | ICD-10-CM | POA: Diagnosis not present

## 2019-01-31 DIAGNOSIS — Z955 Presence of coronary angioplasty implant and graft: Secondary | ICD-10-CM | POA: Diagnosis not present

## 2019-01-31 HISTORY — PX: LEFT HEART CATH AND CORONARY ANGIOGRAPHY: CATH118249

## 2019-01-31 SURGERY — LEFT HEART CATH AND CORONARY ANGIOGRAPHY
Anesthesia: LOCAL

## 2019-01-31 MED ORDER — MIDAZOLAM HCL 2 MG/2ML IJ SOLN
INTRAMUSCULAR | Status: AC
  Filled 2019-01-31: qty 2

## 2019-01-31 MED ORDER — SODIUM CHLORIDE 0.9% FLUSH: 3.0000 mL | Freq: Two times a day (BID) | INTRAVENOUS | Status: DC

## 2019-01-31 MED ORDER — VERAPAMIL HCL 2.5 MG/ML IV SOLN
INTRAVENOUS | Status: DC | PRN
  Administered 2019-01-31: 10 mL via INTRA_ARTERIAL

## 2019-01-31 MED ORDER — SODIUM CHLORIDE 0.9 % WEIGHT BASED INFUSION: 1.0000 mL/kg/h | INTRAVENOUS | Status: DC

## 2019-01-31 MED ORDER — SODIUM CHLORIDE 0.9 % IV SOLN: 250.0000 mL | INTRAVENOUS | Status: DC | PRN

## 2019-01-31 MED ORDER — HEPARIN SODIUM (PORCINE) 1000 UNIT/ML IJ SOLN
INTRAMUSCULAR | Status: AC
  Filled 2019-01-31: qty 1

## 2019-01-31 MED ORDER — FENTANYL CITRATE (PF) 100 MCG/2ML IJ SOLN
INTRAMUSCULAR | Status: AC
  Filled 2019-01-31: qty 2

## 2019-01-31 MED ORDER — HEPARIN (PORCINE) IN NACL 1000-0.9 UT/500ML-% IV SOLN
INTRAVENOUS | Status: AC
  Filled 2019-01-31: qty 500

## 2019-01-31 MED ORDER — VERAPAMIL HCL 2.5 MG/ML IV SOLN
INTRAVENOUS | Status: AC
  Filled 2019-01-31: qty 2

## 2019-01-31 MED ORDER — LIDOCAINE HCL (PF) 1 % IJ SOLN
INTRAMUSCULAR | Status: DC | PRN
  Administered 2019-01-31: 2 mL

## 2019-01-31 MED ORDER — IOHEXOL 350 MG/ML SOLN
INTRAVENOUS | Status: DC | PRN
  Administered 2019-01-31: 65 mL via INTRAVENOUS

## 2019-01-31 MED ORDER — SODIUM CHLORIDE 0.9% FLUSH: 3.0000 mL | INTRAVENOUS | Status: DC | PRN

## 2019-01-31 MED ORDER — MIDAZOLAM HCL 2 MG/2ML IJ SOLN
INTRAMUSCULAR | Status: DC | PRN
  Administered 2019-01-31: 1 mg via INTRAVENOUS

## 2019-01-31 MED ORDER — CLOPIDOGREL BISULFATE 75 MG PO TABS: 75.0000 mg | ORAL_TABLET | ORAL | Status: DC

## 2019-01-31 MED ORDER — ACETAMINOPHEN 325 MG PO TABS: 650.0000 mg | ORAL_TABLET | ORAL | Status: DC | PRN

## 2019-01-31 MED ORDER — SODIUM CHLORIDE 0.9 % IV SOLN: INTRAVENOUS | Status: AC

## 2019-01-31 MED ORDER — HEPARIN SODIUM (PORCINE) 1000 UNIT/ML IJ SOLN
INTRAMUSCULAR | Status: DC | PRN
  Administered 2019-01-31: 6000 [IU] via INTRAVENOUS

## 2019-01-31 MED ORDER — FENTANYL CITRATE (PF) 100 MCG/2ML IJ SOLN
INTRAMUSCULAR | Status: DC | PRN
  Administered 2019-01-31: 25 ug via INTRAVENOUS

## 2019-01-31 MED ORDER — ASPIRIN 81 MG PO CHEW: 81.0000 mg | CHEWABLE_TABLET | ORAL | Status: DC

## 2019-01-31 MED ORDER — ONDANSETRON HCL 4 MG/2ML IJ SOLN: 4.0000 mg | Freq: Four times a day (QID) | INTRAMUSCULAR | Status: DC | PRN

## 2019-01-31 MED ORDER — SODIUM CHLORIDE 0.9 % WEIGHT BASED INFUSION
3.0000 mL/kg/h | INTRAVENOUS | Status: AC
  Administered 2019-01-31: 3 mL/kg/h via INTRAVENOUS

## 2019-01-31 MED ORDER — HEPARIN (PORCINE) IN NACL 1000-0.9 UT/500ML-% IV SOLN
INTRAVENOUS | Status: DC | PRN
  Administered 2019-01-31 (×2): 500 mL

## 2019-01-31 SURGICAL SUPPLY — 9 items

## 2019-01-31 NOTE — Interval H&P Note (Signed)
Cath Lab Visit (complete for each Cath Lab visit)  Clinical Evaluation Leading to the Procedure:   ACS: No.  Non-ACS:    Anginal Classification: CCS III  Anti-ischemic medical therapy: Maximal Therapy (2 or more classes of medications)  Non-Invasive Test Results: No non-invasive testing performed  Prior CABG: No previous CABG      History and Physical Interval Note:  01/31/2019 10:14 AM  Kyle Lambert  has presented today for surgery, with the diagnosis of Coronary Artery Disease.  The various methods of treatment have been discussed with the patient and family. After consideration of risks, benefits and other options for treatment, the patient has consented to  Procedure(s): LEFT HEART CATH AND CORONARY ANGIOGRAPHY (N/A) as a surgical intervention.  The patient's history has been reviewed, patient examined, no change in status, stable for surgery.  I have reviewed the patient's chart and labs.  Questions were answered to the patient's satisfaction.     Sherren Mocha

## 2019-01-31 NOTE — Discharge Instructions (Signed)
Drink plenty of fluids over next 48 hours and keep right wrist elevated at heart level for 24 hours ° °Radial Site Care ° °This sheet gives you information about how to care for yourself after your procedure. Your health care provider may also give you more specific instructions. If you have problems or questions, contact your health care provider. °What can I expect after the procedure? °After the procedure, it is common to have: °· Bruising and tenderness at the catheter insertion area. °Follow these instructions at home: °Medicines °· Take over-the-counter and prescription medicines only as told by your health care provider. °Insertion site care °· Follow instructions from your health care provider about how to take care of your insertion site. Make sure you: °? Wash your hands with soap and water before you change your bandage (dressing). If soap and water are not available, use hand sanitizer. °? Remove your dressing as told by your health care provider. In 24-48 hours °· Check your insertion site every day for signs of infection. Check for: °? Redness, swelling, or pain. °? Fluid or blood. °? Pus or a bad smell. °? Warmth. °· Do not take baths, swim, or use a hot tub until your health care provider approves. °· You may shower 24-48 hours after the procedure, or as directed by your health care provider. °? Remove the dressing and gently wash the site with plain soap and water. °? Pat the area dry with a clean towel. °? Do not rub the site. That could cause bleeding. °· Do not apply powder or lotion to the site. °Activity ° °· For 24 hours after the procedure, or as directed by your health care provider: °? Do not flex or bend the affected arm. °? Do not push or pull heavy objects with the affected arm. °? Do not drive yourself home from the hospital or clinic. You may drive 24 hours after the procedure unless your health care provider tells you not to. °? Do not operate machinery or power tools. °· Do not lift  anything that is heavier than 10 lb (4.5 kg), or the limit that you are told, until your health care provider says that it is safe. For 5 days °· Ask your health care provider when it is okay to: °? Return to work or school. °? Resume usual physical activities or sports. °? Resume sexual activity. °General instructions °· If the catheter site starts to bleed, raise your arm and put firm pressure on the site. If the bleeding does not stop, get help right away. This is a medical emergency. °· If you went home on the same day as your procedure, a responsible adult should be with you for the first 24 hours after you arrive home. °· Keep all follow-up visits as told by your health care provider. This is important. °Contact a health care provider if: °· You have a fever. °· You have redness, swelling, or yellow drainage around your insertion site. °Get help right away if: °· You have unusual pain at the radial site. °· The catheter insertion area swells very fast. °· The insertion area is bleeding, and the bleeding does not stop when you hold steady pressure on the area. °· Your arm or hand becomes pale, cool, tingly, or numb. °These symptoms may represent a serious problem that is an emergency. Do not wait to see if the symptoms will go away. Get medical help right away. Call your local emergency services (911 in the [place] Do not   drive yourself to the hospital. °Summary °· After the procedure, it is common to have bruising and tenderness at the site. °· Follow instructions from your health care provider about how to take care of your radial site wound. Check the wound every day for signs of infection. °· Do not lift anything that is heavier than 10 lb (4.5 kg), or the limit that you are told, until your health care provider says that it is safe. °This information is not intended to replace advice given to you by your health care provider. Make sure you discuss any questions you have with your health care  provider. °Document Released: 12/11/2010 Document Revised: 12/14/2017 Document Reviewed: 12/14/2017 °Elsevier Interactive Patient Education © 2019 [employer] ° °

## 2019-02-01 ENCOUNTER — Encounter (HOSPITAL_COMMUNITY): Payer: Self-pay | Admitting: Cardiovascular Disease

## 2019-02-01 NOTE — Telephone Encounter (Signed)
Atorvastatin 40 mg is a good choice for him and won't interfere with his antihypertensive medications. thanks

## 2019-02-01 NOTE — Telephone Encounter (Signed)
Pt's wife is aware of Dr Antionette Char recommendation, she will contact Dr Burdine's office about atorvastatin/lipid profile follow up.

## 2019-02-02 DIAGNOSIS — E782 Mixed hyperlipidemia: Secondary | ICD-10-CM | POA: Diagnosis not present

## 2019-02-02 DIAGNOSIS — I10 Essential (primary) hypertension: Secondary | ICD-10-CM | POA: Diagnosis not present

## 2019-02-02 DIAGNOSIS — R739 Hyperglycemia, unspecified: Secondary | ICD-10-CM | POA: Diagnosis not present

## 2019-02-02 DIAGNOSIS — N400 Enlarged prostate without lower urinary tract symptoms: Secondary | ICD-10-CM | POA: Diagnosis not present

## 2019-02-02 DIAGNOSIS — R946 Abnormal results of thyroid function studies: Secondary | ICD-10-CM | POA: Diagnosis not present

## 2019-02-07 DIAGNOSIS — N3281 Overactive bladder: Secondary | ICD-10-CM | POA: Diagnosis not present

## 2019-02-07 DIAGNOSIS — N200 Calculus of kidney: Secondary | ICD-10-CM | POA: Diagnosis not present

## 2019-02-07 DIAGNOSIS — I10 Essential (primary) hypertension: Secondary | ICD-10-CM | POA: Diagnosis not present

## 2019-02-07 DIAGNOSIS — I2510 Atherosclerotic heart disease of native coronary artery without angina pectoris: Secondary | ICD-10-CM | POA: Diagnosis not present

## 2019-02-07 DIAGNOSIS — E782 Mixed hyperlipidemia: Secondary | ICD-10-CM | POA: Diagnosis not present

## 2019-02-07 DIAGNOSIS — R7303 Prediabetes: Secondary | ICD-10-CM | POA: Diagnosis not present

## 2019-02-07 DIAGNOSIS — Z6837 Body mass index (BMI) 37.0-37.9, adult: Secondary | ICD-10-CM | POA: Diagnosis not present

## 2019-02-07 DIAGNOSIS — N401 Enlarged prostate with lower urinary tract symptoms: Secondary | ICD-10-CM | POA: Diagnosis not present

## 2019-04-27 ENCOUNTER — Telehealth: Payer: Self-pay | Admitting: *Deleted

## 2019-04-27 NOTE — Telephone Encounter (Signed)
Spoke with patient wife consent to doxy me      Confirm consent - "In the setting of the current Covid19 crisis, you are scheduled for a (phone or video) visit with your provider on (date) at (time).  Just as we do with many in-office visits, in order for you to participate in this visit, we must obtain consent.  If you'd like, I can send this to your mychart (if signed up) or email for you to review.  Otherwise, I can obtain your verbal consent now.  All virtual visits are billed to your insurance company just like a normal visit would be.  By agreeing to a virtual visit, we'd like you to understand that the technology does not allow for your provider to perform an examination, and thus may limit your provider's ability to fully assess your condition. If your provider identifies any concerns that need to be evaluated in person, we will make arrangements to do so.  Finally, though the technology is pretty good, we cannot assure that it will always work on either your or our end, and in the setting of a video visit, we may have to convert it to a phone-only visit.  In either situation, we cannot ensure that we have a secure connection.  Are you willing to proceed?" STAFF: Did the patient verbally acknowledge consent to telehealth visit? Document YES/NO her YES   TELEPHONE CALL NOTE  Kyle Lambert has been deemed a candidate for a follow-up tele-health visit to limit community exposure during the Covid-19 pandemic. I spoke with the patient via phone to ensure availability of phone/video source, confirm preferred email & phone number, and discuss instructions and expectations.  I reminded Kyle Lambert to be prepared with any vital sign and/or heart rhythm information that could potentially be obtained via home monitoring, at the time of his visit. I reminded Kyle Lambert to expect a phone call prior to his visit.  [name], [place] 04/27/2019 10:11 AM     FULL LENGTH CONSENT FOR TELE-HEALTH VISIT    I hereby voluntarily request, consent and authorize [facility] and its employed or contracted physicians, physician assistants, nurse practitioners or other licensed health care professionals (the Practitioner), to provide me with telemedicine health care services (the "Services") as deemed necessary by the treating Practitioner. I acknowledge and consent to receive the Services by the Practitioner via telemedicine. I understand that the telemedicine visit will involve communicating with the Practitioner through live audiovisual communication technology and the disclosure of certain medical information by electronic transmission. I acknowledge that I have been given the opportunity to request an in-person assessment or other available alternative prior to the telemedicine visit and am voluntarily participating in the telemedicine visit.  I understand that I have the right to withhold or withdraw my consent to the use of telemedicine in the course of my care at any time, without affecting my right to future care or treatment, and that the Practitioner or I may terminate the telemedicine visit at any time. I understand that I have the right to inspect all information obtained and/or recorded in the course of the telemedicine visit and may receive copies of available information for a reasonable fee.  I understand that some of the potential risks of receiving the Services via telemedicine include:  Marland Kitchen Delay or interruption in medical evaluation due to technological equipment failure or disruption; . Information transmitted may not be sufficient (e.g. poor resolution of images) to allow for appropriate medical  decision making by the Practitioner; and/or  . In rare instances, security protocols could fail, causing a breach of personal health information.  Furthermore, I acknowledge that it is my responsibility to provide information about my medical history, conditions and care that is complete and accurate  to the best of my ability. I acknowledge that Practitioner's advice, recommendations, and/or decision may be based on factors not within their control, such as incomplete or inaccurate data provided by me or distortions of diagnostic images or specimens that may result from electronic transmissions. I understand that the practice of medicine is not an exact science and that Practitioner makes no warranties or guarantees regarding treatment outcomes. I acknowledge that I will receive a copy of this consent concurrently upon execution via email to the email address I last provided but may also request a printed copy by calling the office of [place].    I understand that my insurance will be billed for this visit.   I have read or had this consent read to me. . I understand the contents of this consent, which adequately explains the benefits and risks of the Services being provided via telemedicine.  . I have been provided ample opportunity to ask questions regarding this consent and the Services and have had my questions answered to my satisfaction. . I give my informed consent for the services to be provided through the use of telemedicine in my medical care  By participating in this telemedicine visit I agree to the above.

## 2019-05-01 ENCOUNTER — Other Ambulatory Visit: Payer: Self-pay

## 2019-05-01 ENCOUNTER — Telehealth (INDEPENDENT_AMBULATORY_CARE_PROVIDER_SITE_OTHER): Payer: PPO | Admitting: Physician Assistant

## 2019-05-01 ENCOUNTER — Encounter: Payer: Self-pay | Admitting: Physician Assistant

## 2019-05-01 VITALS — BP 115/70 | HR 64 | Ht 69.0 in | Wt 254.0 lb

## 2019-05-01 DIAGNOSIS — Z7189 Other specified counseling: Secondary | ICD-10-CM

## 2019-05-01 DIAGNOSIS — I25118 Atherosclerotic heart disease of native coronary artery with other forms of angina pectoris: Secondary | ICD-10-CM | POA: Diagnosis not present

## 2019-05-01 DIAGNOSIS — I10 Essential (primary) hypertension: Secondary | ICD-10-CM | POA: Diagnosis not present

## 2019-05-01 DIAGNOSIS — E782 Mixed hyperlipidemia: Secondary | ICD-10-CM | POA: Diagnosis not present

## 2019-05-01 NOTE — Patient Instructions (Addendum)
Medication Instructions:   Your physician recommends that you continue on your current medications as directed. Please refer to the Current Medication list given to you today.   If you need a refill on your cardiac medications before your next appointment, please call your pharmacy.   Lab work: NONE ORDERED  TODAY    If you have labs (blood work) drawn today and your tests are completely normal, you will receive your results only by: . MyChart Message (if you have MyChart) OR . A paper copy in the mail If you have any lab test that is abnormal or we need to change your treatment, we will call you to review the results.  Testing/Procedures: NONE ORDERED  TODAY     Follow-Up: At [facility], you and your health needs are our priority.  As part of our continuing mission to provide you with exceptional heart care, we have created designated Provider Care Teams.  These Care Teams include your primary Cardiologist (physician) and Advanced Practice Providers (APPs -  Physician Assistants and Nurse Practitioners) who all work together to provide you with the care you need, when you need it. You will need a follow up appointment in:  6 months.  Please call our office 2 months in advance to schedule this appointment.  You may see Michael Cooper, MD or one of the following Advanced Practice Providers on your designated Care Team: Scott Weaver, PA-C Vin Bhagat, PA-C . Janine Hammond, NP  Any Other Special Instructions Will Be Listed Below (If Applicable).    

## 2019-05-01 NOTE — Progress Notes (Signed)
Virtual Visit via Telephone Note   This visit type was conducted due to national recommendations for restrictions regarding the COVID-19 Pandemic (e.g. social distancing) in an effort to limit this patient's exposure and mitigate transmission in our community.  Due to his co-morbid illnesses, this patient is at least at moderate risk for complications without adequate follow up.  This format is felt to be most appropriate for this patient at this time.  The patient did not have access to video technology/had technical difficulties with video requiring transitioning to audio format only (telephone).  All issues noted in this document were discussed and addressed.  No physical exam could be performed with this format.  Please refer to the patient's chart for his  consent to telehealth for [facility].   Date:  05/01/2019   ID:  Kyle Lambert, DOB 08/02/1951, MRN [id number]  Patient Location: Home Provider Location: Home  PCP:  Curlene Labrum, MD  Cardiologist:  Sherren Mocha, MD   Electrophysiologist:  None   Evaluation Performed:  Follow-Up Visit  Chief Complaint:  CAD, s/p cath  History of Present Illness:    Kyle Lambert is a 68 y.o. male with CAD s/p DES to the RCA in 2013 and DES to the mid LAD and POBA of the proximal D1 in 2014. Myoview in 2018 was low risk.  He had progressive symptoms of angina in 01/2019 and underwent Cardiac Catheterization.  This demonstrated patent stents in the LAD and RCA and moderate residual stenosis in a small to medium caliber D1 that was previously tx with POBA.    Today, he notes he is doing well without recurrent chest discomfort or shortness of breath.  He gets tired when he does more activity.  This seems to be fairly stable for him.  He has not had any orthopnea, lower extremity swelling or syncope.  He did have some dizziness when he stood up suddenly a few days ago.  He fell back on the couch.  He has not had any further episodes.  The  patient does not have symptoms concerning for COVID-19 infection (fever, chills, cough, or new shortness of breath).    Past Medical History:  Diagnosis Date  . Arthritis   . CAD (coronary artery disease)    a. 03/2012 Cath: LAD 80, RCA 80, nl EF;  b. PCI of RCA w 3.0x55mm Promus DES, FFR of LAD was nl @ 0.85 ->Med Rx.;  c. LHC 02/15/13: proximal LAD 40-50%, mid LAD 95%, small D1 95-99%, mid CFX 30-40%, proximal RCA stent patent with ostial 20-30% prior to the stent, ostial PDA 50%, EF 55-65%. PCI:  Promus DES to the mid LAD and POBA to the proximal D1   . Hyperlipidemia   . Hypertension   . Obesity    Past Surgical History:  Procedure Laterality Date  . CORONARY ANGIOPLASTY WITH STENT PLACEMENT  04/24/12   DES-RCA  . CORONARY ANGIOPLASTY WITH STENT PLACEMENT  02/15/13   Patent RCA stent, diffuse nonobstructive R PDA, LCx disease, 99% mid LAD stenosis s/p DES-mid LAD; LVEF 60%  . CORONARY STENT PLACEMENT    . HERNIA REPAIR Left inguinal  . LEFT HEART CATH AND CORONARY ANGIOGRAPHY N/A 01/31/2019   Procedure: LEFT HEART CATH AND CORONARY ANGIOGRAPHY;  Surgeon: Sherren Mocha, MD;  Location: [place] CV [facility];  Service: Cardiovascular;  Laterality: N/A;  . LEFT HEART CATHETERIZATION WITH CORONARY ANGIOGRAM N/A 04/21/2012   Procedure: LEFT HEART CATHETERIZATION WITH CORONARY ANGIOGRAM;  Surgeon: Marcello Moores  Baird Kay, MD;  Location: [place] CATH LAB;  Service: Cardiovascular;  Laterality: N/A;  . LEFT HEART CATHETERIZATION WITH CORONARY ANGIOGRAM N/A 02/15/2013   Procedure: LEFT HEART CATHETERIZATION WITH CORONARY ANGIOGRAM;  Surgeon: Sherren Mocha, MD;  Location: [facility] CATH LAB;  Service: Cardiovascular;  Laterality: N/A;  . LITHOTRIPSY    . PERCUTANEOUS CORONARY STENT INTERVENTION (PCI-S) N/A 04/24/2012   Procedure: PERCUTANEOUS CORONARY STENT INTERVENTION (PCI-S);  Surgeon: Sherren Mocha, MD;  Location: [facility] CATH LAB;  Service: Cardiovascular;  Laterality: N/A;  . PERCUTANEOUS CORONARY STENT INTERVENTION  (PCI-S)  02/15/2013   Procedure: PERCUTANEOUS CORONARY STENT INTERVENTION (PCI-S);  Surgeon: Sherren Mocha, MD;  Location: [facility] C[facility] LAB;  Service: Cardiovascular;;     Current Meds  Medication Sig  . acetaminophen (TYLENOL) 500 MG tablet Take 500-1,000 mg by mouth every 6 (six) hours as needed for mild pain or moderate pain.  Marland Kitchen amLODipine (NORVASC) 5 MG tablet TAKE 1 TABLET DAILY  . aspirin EC 81 MG tablet Take 81 mg by mouth daily.  Marland Kitchen atorvastatin (LIPITOR) 40 MG tablet Take 40 mg by mouth daily.  . clopidogrel (PLAVIX) 75 MG tablet TAKE 1 TABLET (75 MG TOTAL) DAILY WITH BREAKFAST.  . cyclobenzaprine (FLEXERIL) 10 MG tablet Take 10 mg by mouth 3 (three) times daily as needed for muscle spasms.   Marland Kitchen docusate sodium (COLACE) 50 MG capsule Take 50 mg by mouth daily.  . isosorbide mononitrate (IMDUR) 120 MG 24 hr tablet Take 1 tablet (120 mg total) by mouth daily.  Marland Kitchen lisinopril (PRINIVIL,ZESTRIL) 20 MG tablet Take 20 mg daily by mouth.  . metoprolol tartrate (LOPRESSOR) 25 MG tablet TAKE 1 TABLET TWICE A DAY  . nitroGLYCERIN (NITROSTAT) 0.4 MG SL tablet Place 1 tablet (0.4 mg total) under the tongue every 5 (five) minutes x 3 doses as needed for chest pain.  . pantoprazole (PROTONIX) 40 MG tablet Take 1 tablet (40 mg total) by mouth daily.     Allergies:   Patient has no known allergies.   Social History   Tobacco Use  . Smoking status: Never Smoker  . Smokeless tobacco: Never Used  Substance Use Topics  . Alcohol use: No    Comment: " quit along time ago"  . Drug use: No     Family Hx: The patient's family history is not on file.  ROS:   Please see the history of present illness.     All other systems reviewed and are negative.   Prior CV studies:   The following studies were reviewed today:  Cardiac Catheterization 01/31/2019 LAD ost 40, mid stent patent; D1 ost 50 LCx mid 65; OM1 ost 30 RCA prox stent patent with 10 ISR; RPDA ost 50  Myoview 08/30/17 EF 67, diaph atten,  no ischemia, Low Risk  LHC 02/15/13:  proximal LAD 40-50%, mid LAD 95%, small D1 95-99% mid CFX 30-40% proximal RCA stent patent with ostial 20-30% prior to the stent, ostial PDA 50% EF 55-65%.  PCI: Promus DES to the mid LAD and POBA to the proximal D1.   Labs/Other Tests and Data Reviewed:    EKG:  No ECG reviewed.  Recent Labs: 01/15/2019: BUN 11; Creatinine, Ser 0.74; Hemoglobin 14.6; Platelets 229; Potassium 4.6; Sodium 135   Recent Lipid Panel No results found for: CHOL, TRIG, HDL, CHOLHDL, LDLCALC, LDLDIRECT    Wt Readings from Last 3 Encounters:  05/01/19 254 lb (115.2 kg)  01/31/19 249 lb (112.9 kg)  01/15/19 249 lb 6.4 oz (113.1 kg)  Objective:    Vital Signs:  BP 115/70   Pulse 64   Ht 5\' 9"  (1.753 m)   Wt 254 lb (115.2 kg)   BMI 37.51 kg/m    VITAL SIGNS:  reviewed GEN:  no acute distress RESPIRATORY:  No labored breathing NEURO:  Alert and oriented PSYCH:  Mood seems to be good  ASSESSMENT & PLAN:     Coronary artery disease of native artery of native heart with stable angina pectoris ([place]) History of DES to the RCA in 2013 and DES to the mid LAD with balloon angioplasty to the proximal D1 in 2014.  Recent cardiac catheterization demonstrated patent stents and moderate nonobstructive disease elsewhere.  He is currently doing well without recurrent angina on his present medical regimen.  Continue current therapy.  Essential hypertension The patient's blood pressure is controlled on his current regimen.  Continue current therapy.   Mixed hyperlipidemia Continue high intensity statin.  COVID-19 Education: The signs and symptoms of COVID-19 were discussed with the patient and how to seek care for testing (follow up with PCP or arrange E-visit).  The importance of social distancing was discussed today.  Time:   Today, I have spent 5 minutes with the patient with telehealth technology discussing the above problems.     Medication Adjustments/Labs  and Tests Ordered: Current medicines are reviewed at length with the patient today.  Concerns regarding medicines are outlined above.   Tests Ordered: No orders of the defined types were placed in this encounter.   Medication Changes: No orders of the defined types were placed in this encounter.   Disposition:  Follow up in 6 month(s) With Dr. Burt Knack  Signed, Richardson Dopp, PA-C  05/01/2019 3:58 PM    [place]

## 2019-05-14 DIAGNOSIS — R300 Dysuria: Secondary | ICD-10-CM | POA: Diagnosis not present

## 2019-05-14 DIAGNOSIS — R350 Frequency of micturition: Secondary | ICD-10-CM | POA: Diagnosis not present

## 2019-05-14 DIAGNOSIS — Z6837 Body mass index (BMI) 37.0-37.9, adult: Secondary | ICD-10-CM | POA: Diagnosis not present

## 2019-05-30 DIAGNOSIS — Z6837 Body mass index (BMI) 37.0-37.9, adult: Secondary | ICD-10-CM | POA: Diagnosis not present

## 2019-05-30 DIAGNOSIS — N3941 Urge incontinence: Secondary | ICD-10-CM | POA: Diagnosis not present

## 2019-05-30 DIAGNOSIS — R300 Dysuria: Secondary | ICD-10-CM | POA: Diagnosis not present

## 2019-05-30 DIAGNOSIS — R350 Frequency of micturition: Secondary | ICD-10-CM | POA: Diagnosis not present

## 2019-08-13 DIAGNOSIS — I10 Essential (primary) hypertension: Secondary | ICD-10-CM | POA: Diagnosis not present

## 2019-08-13 DIAGNOSIS — N400 Enlarged prostate without lower urinary tract symptoms: Secondary | ICD-10-CM | POA: Diagnosis not present

## 2019-08-13 DIAGNOSIS — E782 Mixed hyperlipidemia: Secondary | ICD-10-CM | POA: Diagnosis not present

## 2019-08-13 DIAGNOSIS — R946 Abnormal results of thyroid function studies: Secondary | ICD-10-CM | POA: Diagnosis not present

## 2019-08-13 DIAGNOSIS — R7303 Prediabetes: Secondary | ICD-10-CM | POA: Diagnosis not present

## 2019-08-13 DIAGNOSIS — R5383 Other fatigue: Secondary | ICD-10-CM | POA: Diagnosis not present

## 2019-08-16 DIAGNOSIS — Z0001 Encounter for general adult medical examination with abnormal findings: Secondary | ICD-10-CM | POA: Diagnosis not present

## 2019-08-16 DIAGNOSIS — N3281 Overactive bladder: Secondary | ICD-10-CM | POA: Diagnosis not present

## 2019-08-16 DIAGNOSIS — I2510 Atherosclerotic heart disease of native coronary artery without angina pectoris: Secondary | ICD-10-CM | POA: Diagnosis not present

## 2019-08-16 DIAGNOSIS — Z23 Encounter for immunization: Secondary | ICD-10-CM | POA: Diagnosis not present

## 2019-08-16 DIAGNOSIS — C44311 Basal cell carcinoma of skin of nose: Secondary | ICD-10-CM | POA: Diagnosis not present

## 2019-08-16 DIAGNOSIS — N3941 Urge incontinence: Secondary | ICD-10-CM | POA: Diagnosis not present

## 2019-08-16 DIAGNOSIS — R7303 Prediabetes: Secondary | ICD-10-CM | POA: Diagnosis not present

## 2019-08-16 DIAGNOSIS — Z6837 Body mass index (BMI) 37.0-37.9, adult: Secondary | ICD-10-CM | POA: Diagnosis not present

## 2019-08-30 DIAGNOSIS — C44311 Basal cell carcinoma of skin of nose: Secondary | ICD-10-CM | POA: Diagnosis not present

## 2019-09-05 DIAGNOSIS — G4733 Obstructive sleep apnea (adult) (pediatric): Secondary | ICD-10-CM | POA: Diagnosis not present

## 2019-09-07 DIAGNOSIS — G4733 Obstructive sleep apnea (adult) (pediatric): Secondary | ICD-10-CM | POA: Diagnosis not present

## 2019-09-14 DIAGNOSIS — G4733 Obstructive sleep apnea (adult) (pediatric): Secondary | ICD-10-CM | POA: Diagnosis not present

## 2019-10-04 DIAGNOSIS — Z85828 Personal history of other malignant neoplasm of skin: Secondary | ICD-10-CM | POA: Diagnosis not present

## 2019-10-04 DIAGNOSIS — Z08 Encounter for follow-up examination after completed treatment for malignant neoplasm: Secondary | ICD-10-CM | POA: Diagnosis not present

## 2019-10-04 DIAGNOSIS — C44622 Squamous cell carcinoma of skin of right upper limb, including shoulder: Secondary | ICD-10-CM | POA: Diagnosis not present

## 2019-11-07 DIAGNOSIS — R7303 Prediabetes: Secondary | ICD-10-CM | POA: Diagnosis not present

## 2019-11-07 DIAGNOSIS — R5383 Other fatigue: Secondary | ICD-10-CM | POA: Diagnosis not present

## 2019-11-07 DIAGNOSIS — I10 Essential (primary) hypertension: Secondary | ICD-10-CM | POA: Diagnosis not present

## 2019-11-14 DIAGNOSIS — R7303 Prediabetes: Secondary | ICD-10-CM | POA: Diagnosis not present

## 2019-11-14 DIAGNOSIS — G4733 Obstructive sleep apnea (adult) (pediatric): Secondary | ICD-10-CM | POA: Diagnosis not present

## 2019-11-14 DIAGNOSIS — Z6837 Body mass index (BMI) 37.0-37.9, adult: Secondary | ICD-10-CM | POA: Diagnosis not present

## 2019-11-14 DIAGNOSIS — E782 Mixed hyperlipidemia: Secondary | ICD-10-CM | POA: Diagnosis not present

## 2019-11-14 DIAGNOSIS — I10 Essential (primary) hypertension: Secondary | ICD-10-CM | POA: Diagnosis not present

## 2019-11-14 DIAGNOSIS — N3281 Overactive bladder: Secondary | ICD-10-CM | POA: Diagnosis not present

## 2019-11-14 DIAGNOSIS — I2510 Atherosclerotic heart disease of native coronary artery without angina pectoris: Secondary | ICD-10-CM | POA: Diagnosis not present

## 2019-11-24 ENCOUNTER — Other Ambulatory Visit: Payer: Self-pay | Admitting: Cardiovascular Disease

## 2020-02-01 ENCOUNTER — Emergency Department (HOSPITAL_COMMUNITY): Payer: PPO

## 2020-02-01 ENCOUNTER — Inpatient Hospital Stay (HOSPITAL_COMMUNITY)
Admission: EM | Admit: 2020-02-01 | Discharge: 2020-02-03 | DRG: 872 | Disposition: A | Discharge status: Home / Self Care | Payer: PPO | Attending: Internal Medicine | Admitting: Internal Medicine

## 2020-02-01 ENCOUNTER — Other Ambulatory Visit: Payer: Self-pay

## 2020-02-01 ENCOUNTER — Encounter (HOSPITAL_COMMUNITY): Payer: Self-pay | Admitting: Emergency Medicine

## 2020-02-01 DIAGNOSIS — R0902 Hypoxemia: Secondary | ICD-10-CM | POA: Diagnosis not present

## 2020-02-01 DIAGNOSIS — Z7982 Long term (current) use of aspirin: Secondary | ICD-10-CM | POA: Diagnosis not present

## 2020-02-01 DIAGNOSIS — E872 Acidosis: Secondary | ICD-10-CM | POA: Diagnosis not present

## 2020-02-01 DIAGNOSIS — Z955 Presence of coronary angioplasty implant and graft: Secondary | ICD-10-CM

## 2020-02-01 DIAGNOSIS — E785 Hyperlipidemia, unspecified: Secondary | ICD-10-CM | POA: Diagnosis not present

## 2020-02-01 DIAGNOSIS — M1990 Unspecified osteoarthritis, unspecified site: Secondary | ICD-10-CM | POA: Diagnosis present

## 2020-02-01 DIAGNOSIS — R0602 Shortness of breath: Secondary | ICD-10-CM | POA: Diagnosis not present

## 2020-02-01 DIAGNOSIS — Z6837 Body mass index (BMI) 37.0-37.9, adult: Secondary | ICD-10-CM

## 2020-02-01 DIAGNOSIS — M5489 Other dorsalgia: Secondary | ICD-10-CM | POA: Diagnosis not present

## 2020-02-01 DIAGNOSIS — I10 Essential (primary) hypertension: Secondary | ICD-10-CM | POA: Diagnosis present

## 2020-02-01 DIAGNOSIS — R52 Pain, unspecified: Secondary | ICD-10-CM | POA: Diagnosis not present

## 2020-02-01 DIAGNOSIS — Q631 Lobulated, fused and horseshoe kidney: Secondary | ICD-10-CM | POA: Diagnosis not present

## 2020-02-01 DIAGNOSIS — E669 Obesity, unspecified: Secondary | ICD-10-CM | POA: Diagnosis present

## 2020-02-01 DIAGNOSIS — I491 Atrial premature depolarization: Secondary | ICD-10-CM | POA: Diagnosis present

## 2020-02-01 DIAGNOSIS — I2510 Atherosclerotic heart disease of native coronary artery without angina pectoris: Secondary | ICD-10-CM | POA: Diagnosis present

## 2020-02-01 DIAGNOSIS — Z7902 Long term (current) use of antithrombotics/antiplatelets: Secondary | ICD-10-CM

## 2020-02-01 DIAGNOSIS — N12 Tubulo-interstitial nephritis, not specified as acute or chronic: Secondary | ICD-10-CM | POA: Diagnosis not present

## 2020-02-01 DIAGNOSIS — Z20822 Contact with and (suspected) exposure to covid-19: Secondary | ICD-10-CM | POA: Diagnosis present

## 2020-02-01 DIAGNOSIS — N200 Calculus of kidney: Secondary | ICD-10-CM | POA: Diagnosis present

## 2020-02-01 DIAGNOSIS — R000 Tachycardia, unspecified: Secondary | ICD-10-CM | POA: Diagnosis not present

## 2020-02-01 DIAGNOSIS — A419 Sepsis, unspecified organism: Secondary | ICD-10-CM | POA: Diagnosis not present

## 2020-02-01 DIAGNOSIS — E782 Mixed hyperlipidemia: Secondary | ICD-10-CM | POA: Diagnosis not present

## 2020-02-01 DIAGNOSIS — R509 Fever, unspecified: Secondary | ICD-10-CM | POA: Diagnosis not present

## 2020-02-01 DIAGNOSIS — Z79899 Other long term (current) drug therapy: Secondary | ICD-10-CM

## 2020-02-01 DIAGNOSIS — M549 Dorsalgia, unspecified: Secondary | ICD-10-CM | POA: Diagnosis not present

## 2020-02-01 LAB — CBC WITH DIFFERENTIAL/PLATELET
Abs Immature Granulocytes: 0.03 10*3/uL (ref 0.00–0.07)
Basophils Absolute: 0 10*3/uL (ref 0.0–0.1)
Basophils Relative: 1 %
Eosinophils Absolute: 0.1 10*3/uL (ref 0.0–0.5)
Eosinophils Relative: 1 %
HCT: 45.7 % (ref 39.0–52.0)
Hemoglobin: 15.1 g/dL (ref 13.0–17.0)
Immature Granulocytes: 0 %
Lymphocytes Relative: 14 %
Lymphs Abs: 1 10*3/uL (ref 0.7–4.0)
MCH: 29.8 pg (ref 26.0–34.0)
MCHC: 33 g/dL (ref 30.0–36.0)
MCV: 90.3 fL (ref 80.0–100.0)
Monocytes Absolute: 0.1 10*3/uL (ref 0.1–1.0)
Monocytes Relative: 1 %
Neutro Abs: 6.4 10*3/uL (ref 1.7–7.7)
Neutrophils Relative %: 83 %
Platelets: 244 10*3/uL (ref 150–400)
RBC: 5.06 MIL/uL (ref 4.22–5.81)
RDW: 13.2 % (ref 11.5–15.5)
WBC: 7.6 10*3/uL (ref 4.0–10.5)
nRBC: 0 % (ref 0.0–0.2)

## 2020-02-01 LAB — COMPREHENSIVE METABOLIC PANEL
ALT: 25 U/L (ref 0–44)
AST: 26 U/L (ref 15–41)
Albumin: 4.3 g/dL (ref 3.5–5.0)
Alkaline Phosphatase: 99 U/L (ref 38–126)
Anion gap: 15 (ref 5–15)
BUN: 16 mg/dL (ref 8–23)
CO2: 17 mmol/L — ABNORMAL LOW (ref 22–32)
Calcium: 8.5 mg/dL — ABNORMAL LOW (ref 8.9–10.3)
Chloride: 102 mmol/L (ref 98–111)
Creatinine, Ser: 0.91 mg/dL (ref 0.61–1.24)
GFR calc Af Amer: >60 mL/min (ref >60)
GFR calc non Af Amer: >60 mL/min (ref >60)
Glucose, Bld: 159 mg/dL — ABNORMAL HIGH (ref 70–99)
Potassium: 4.2 mmol/L (ref 3.5–5.1)
Sodium: 134 mmol/L — ABNORMAL LOW (ref 135–145)
Total Bilirubin: 1.2 mg/dL (ref 0.3–1.2)
Total Protein: 7.4 g/dL (ref 6.5–8.1)

## 2020-02-01 LAB — URINALYSIS, ROUTINE W REFLEX MICROSCOPIC
Bilirubin Urine: NEGATIVE
Glucose, UA: NEGATIVE mg/dL
Ketones, ur: NEGATIVE mg/dL
Nitrite: NEGATIVE
Protein, ur: NEGATIVE mg/dL
RBC / HPF: >50 RBC/hpf — ABNORMAL HIGH (ref 0–5)
Specific Gravity, Urine: 1.011 (ref 1.005–1.030)
pH: 5 (ref 5.0–8.0)

## 2020-02-01 LAB — LACTIC ACID, PLASMA
Lactic Acid, Venous: 4.4 mmol/L (ref 0.5–1.9)
Lactic Acid, Venous: 4.3 mmol/L (ref 0.5–1.9)
Lactic Acid, Venous: 2.9 mmol/L (ref 0.5–1.9)
Lactic Acid, Venous: 3 mmol/L (ref 0.5–1.9)

## 2020-02-01 LAB — PROTIME-INR
INR: 1.1 (ref 0.8–1.2)
Prothrombin Time: 13.7 seconds (ref 11.4–15.2)

## 2020-02-01 LAB — HIV ANTIBODY (ROUTINE TESTING W REFLEX): HIV Screen 4th Generation wRfx: NONREACTIVE

## 2020-02-01 LAB — POC SARS CORONAVIRUS 2 AG -  ED: SARS Coronavirus 2 Ag: NEGATIVE

## 2020-02-01 LAB — PROCALCITONIN: Procalcitonin: 35.22 ng/mL

## 2020-02-01 LAB — FIBRINOGEN: Fibrinogen: 399 mg/dL (ref 210–475)

## 2020-02-01 LAB — HEMOGLOBIN A1C
Hgb A1c MFr Bld: 6.3 % — ABNORMAL HIGH (ref 4.8–5.6)
Mean Plasma Glucose: 134.11 mg/dL

## 2020-02-01 LAB — APTT: aPTT: 24 seconds (ref 24–36)

## 2020-02-01 LAB — SARS CORONAVIRUS 2 (TAT 6-24 HRS): SARS Coronavirus 2: NEGATIVE

## 2020-02-01 MED ORDER — LACTATED RINGERS IV BOLUS
1000.0000 mL | Freq: Once | INTRAVENOUS | Status: AC
  Administered 2020-02-01: 1000 mL via INTRAVENOUS

## 2020-02-01 MED ORDER — SODIUM CHLORIDE 0.9 % IV BOLUS
500.0000 mL | Freq: Once | INTRAVENOUS | Status: AC
  Administered 2020-02-01: 500 mL via INTRAVENOUS

## 2020-02-01 MED ORDER — SODIUM CHLORIDE 0.9 % IV SOLN
1.0000 g | INTRAVENOUS | Status: DC
  Administered 2020-02-01: 1 g via INTRAVENOUS
  Filled 2020-02-01 (×2): qty 10

## 2020-02-01 MED ORDER — ISOSORBIDE MONONITRATE ER 60 MG PO TB24
30.0000 mg | ORAL_TABLET | Freq: Every day | ORAL | Status: DC
  Administered 2020-02-01 – 2020-02-02 (×2): 30 mg via ORAL
  Filled 2020-02-01 (×2): qty 1

## 2020-02-01 MED ORDER — CLOPIDOGREL BISULFATE 75 MG PO TABS
75.0000 mg | ORAL_TABLET | Freq: Every day | ORAL | Status: DC
  Administered 2020-02-01 – 2020-02-03 (×3): 75 mg via ORAL
  Filled 2020-02-01 (×3): qty 1

## 2020-02-01 MED ORDER — VANCOMYCIN HCL 2000 MG/400ML IV SOLN
2000.0000 mg | Freq: Once | INTRAVENOUS | Status: AC
  Administered 2020-02-01: 2000 mg via INTRAVENOUS
  Filled 2020-02-01: qty 400

## 2020-02-01 MED ORDER — SODIUM CHLORIDE 0.9 % IV SOLN: INTRAVENOUS | Status: DC

## 2020-02-01 MED ORDER — METOPROLOL TARTRATE 25 MG PO TABS
12.5000 mg | ORAL_TABLET | Freq: Two times a day (BID) | ORAL | Status: DC
  Administered 2020-02-01 – 2020-02-02 (×2): 12.5 mg via ORAL
  Filled 2020-02-01 (×2): qty 1

## 2020-02-01 MED ORDER — ASPIRIN EC 81 MG PO TBEC
81.0000 mg | DELAYED_RELEASE_TABLET | Freq: Every day | ORAL | Status: DC
  Administered 2020-02-01 – 2020-02-03 (×3): 81 mg via ORAL
  Filled 2020-02-01 (×3): qty 1

## 2020-02-01 MED ORDER — SODIUM CHLORIDE 0.9 % IV SOLN
2.0000 g | Freq: Three times a day (TID) | INTRAVENOUS | Status: DC
  Administered 2020-02-01 – 2020-02-03 (×6): 2 g via INTRAVENOUS
  Filled 2020-02-01 (×7): qty 2

## 2020-02-01 MED ORDER — ENOXAPARIN SODIUM 40 MG/0.4ML ~~LOC~~ SOLN: 40.0000 mg | SUBCUTANEOUS | Status: DC

## 2020-02-01 MED ORDER — PANTOPRAZOLE SODIUM 40 MG PO TBEC
40.0000 mg | DELAYED_RELEASE_TABLET | Freq: Every day | ORAL | Status: DC
  Administered 2020-02-01 – 2020-02-03 (×3): 40 mg via ORAL
  Filled 2020-02-01 (×3): qty 1

## 2020-02-01 MED ORDER — ACETAMINOPHEN 650 MG RE SUPP: 650.0000 mg | Freq: Four times a day (QID) | RECTAL | Status: DC | PRN

## 2020-02-01 MED ORDER — VANCOMYCIN HCL 1750 MG/350ML IV SOLN
1750.0000 mg | Freq: Two times a day (BID) | INTRAVENOUS | Status: DC
  Administered 2020-02-02 (×2): 1750 mg via INTRAVENOUS
  Filled 2020-02-01 (×6): qty 350

## 2020-02-01 MED ORDER — ATORVASTATIN CALCIUM 40 MG PO TABS
40.0000 mg | ORAL_TABLET | Freq: Every day | ORAL | Status: DC
  Administered 2020-02-01 – 2020-02-03 (×3): 40 mg via ORAL
  Filled 2020-02-01 (×3): qty 1

## 2020-02-01 MED ORDER — ONDANSETRON HCL 4 MG PO TABS: 4.0000 mg | ORAL_TABLET | Freq: Four times a day (QID) | ORAL | Status: DC | PRN

## 2020-02-01 MED ORDER — ACETAMINOPHEN 325 MG PO TABS
650.0000 mg | ORAL_TABLET | Freq: Four times a day (QID) | ORAL | Status: DC | PRN
  Administered 2020-02-02: 650 mg via ORAL
  Filled 2020-02-01: qty 2

## 2020-02-01 MED ORDER — ONDANSETRON HCL 4 MG/2ML IJ SOLN: 4.0000 mg | Freq: Four times a day (QID) | INTRAMUSCULAR | Status: DC | PRN

## 2020-02-01 MED ORDER — ENOXAPARIN SODIUM 60 MG/0.6ML ~~LOC~~ SOLN
60.0000 mg | SUBCUTANEOUS | Status: DC
  Administered 2020-02-01 – 2020-02-02 (×2): 60 mg via SUBCUTANEOUS
  Filled 2020-02-01 (×2): qty 0.6

## 2020-02-01 MED ORDER — SODIUM CHLORIDE 0.9 % IV SOLN
1.0000 g | Freq: Once | INTRAVENOUS | Status: AC
  Administered 2020-02-01: 1 g via INTRAVENOUS

## 2020-02-01 MED ORDER — ACETAMINOPHEN 500 MG PO TABS
1000.0000 mg | ORAL_TABLET | Freq: Once | ORAL | Status: AC
  Administered 2020-02-01: 1000 mg via ORAL
  Filled 2020-02-01: qty 2

## 2020-02-01 MED ORDER — IOHEXOL 350 MG/ML SOLN
100.0000 mL | Freq: Once | INTRAVENOUS | Status: AC | PRN
  Administered 2020-02-01: 100 mL via INTRAVENOUS

## 2020-02-01 NOTE — ED Triage Notes (Signed)
EMS gave 4 mg's of Zofran IM

## 2020-02-01 NOTE — ED Notes (Signed)
Date and time results received: 02/01/20 5:17 AM (use smartphrase ".now" to insert current time)  Test: lactic acid Critical Value: 4.4  Name of Provider Notified: Dr Dayna Barker  Orders Received? Or Actions Taken?: see chart

## 2020-02-01 NOTE — Plan of Care (Signed)
  Problem: Education: Goal: Knowledge of General Education information will improve Description: Including pain rating scale, medication(s)/side effects and non-pharmacologic comfort measures Outcome: Progressing   Problem: Pain Managment: Goal: General experience of comfort will improve Outcome: Progressing   Problem: Safety: Goal: Ability to remain free from injury will improve Outcome: Progressing   

## 2020-02-01 NOTE — H&P (Signed)
History and Physical  Kyle Lambert [id number] DOB: May 16, 1951 DOA: 02/01/2020   PCP: Curlene Labrum, MD   Patient coming from: Home  Chief Complaint: f/c  HPI:  Kyle Lambert is a 69 y.o. male with medical history of with coronary disease, hypertension, hyperlipidemia presented with acute onset of fevers and chills when he woke up in the early morning of 02/01/2020.  He took his temperature at home and it was 101.0 F.  Patient also developed some shortness of breath.  Interestingly, the patient stated that he had been in his usual state of health without any complaints prior to the early morning of 02/01/2020.  He has not had any chest pain, headache, coughing, hemoptysis, diarrhea, abdominal pain. However, in the early a.m. the patient had associated nausea and vomiting x3 episodes without any blood.  He was given Zofran by EMS.  He also complained of some dysuria and hematuria in the early a.m. through 1221.  He complains of chronic right flank/lower back pain which she has had for 6 months.  He states that actually improves when he is able to ambulate.  He denied any hematochezia, melena, rashes, or synovitis. In the emergency department, the patient was febrile up to 101.4 F with tachycardia into the 120s.  Patient has soft blood pressures with SBP in the 100s.  Oxygen saturation was 96-90% room air.  BMP sodium 134, potassium 4.2, serum creatinine 0.91, bicarbonate 17.  LFTs were unremarkable.  WBC 7.6, hemoglobin 15.1, platelets 244,000.  Lactic acid was 4.4.  CT angiogram chest was negative for pulmonary embolus.  Did not show any infiltrates or edema.  There was shotty mediastinal lymph nodes.  CT abdomen/pelvis showed a left horseshoe kidney with left intraparenchymal renal stone without obstruction.  There was shotty retroperitoneal lymph nodes.  The patient was started on ceftriaxone and fluid resuscitated.  Admission was requested for further treatment and  work-up.  Assessment/Plan: Sepsis -Present on admission -Likely due to urinary source, but concerned about bacteremia -Lactic acid peaked 4.4 -Empiric vancomycin and cefepime pending culture data -Check procalcitonin -Chest x-ray without any infiltrates or edema -CT chest/abdomen/pelvis as above -UA 11-20 WBC  Dyspnea -Likely secondary to metabolic acidosis -CTA chest negative PE, edema, infiltrates -Stable on room air  Lactic acidosis -Lactic acid peaked 4.4  Coronary artery disease -No chest pain presently -Continue lower dose metoprolol and Imdur due to soft blood pressures -Continue aspirin Plavix  Essential hypertension -Holding amlodipine secondary to soft blood pressure -Continue lower dose metoprolol and Imdur  Hyperlipidemia -Continue statin  Obesity -BMI 37.51 -lifestyle modfication     Past Medical History:  Diagnosis Date  . Arthritis   . CAD (coronary artery disease)    a. 03/2012 Cath: LAD 80, RCA 80, nl EF;  b. PCI of RCA w 3.0x4mm Promus DES, FFR of LAD was nl @ 0.85 ->Med Rx.;  c. LHC 02/15/13: proximal LAD 40-50%, mid LAD 95%, small D1 95-99%, mid CFX 30-40%, proximal RCA stent patent with ostial 20-30% prior to the stent, ostial PDA 50%, EF 55-65%. PCI:  Promus DES to the mid LAD and POBA to the proximal D1   . Hyperlipidemia   . Hypertension   . Obesity    Past Surgical History:  Procedure Laterality Date  . CORONARY ANGIOPLASTY WITH STENT PLACEMENT  04/24/12   DES-RCA  . CORONARY ANGIOPLASTY WITH STENT PLACEMENT  02/15/13   Patent RCA stent, diffuse nonobstructive R PDA, LCx disease, 99% mid  LAD stenosis s/p DES-mid LAD; LVEF 60%  . CORONARY STENT PLACEMENT    . HERNIA REPAIR Left inguinal  . LEFT HEART CATH AND CORONARY ANGIOGRAPHY N/A 01/31/2019   Procedure: LEFT HEART CATH AND CORONARY ANGIOGRAPHY;  Surgeon: Sherren Mocha, MD;  Location: [facility];  Service: Cardiovascular;  Laterality: N/A;  . LEFT HEART CATHETERIZATION WITH  CORONARY ANGIOGRAM N/A 04/21/2012   Procedure: LEFT HEART CATHETERIZATION WITH CORONARY ANGIOGRAM;  Surgeon: Hillary Bow, MD;  Location: [facility] CATH LAB;  Service: Cardiovascular;  Laterality: N/A;  . LEFT HEART CATHETERIZATION WITH CORONARY ANGIOGRAM N/A 02/15/2013   Procedure: LEFT HEART CATHETERIZATION WITH CORONARY ANGIOGRAM;  Surgeon: Sherren Mocha, MD;  Location: [facility] CATH LAB;  Service: Cardiovascular;  Laterality: N/A;  . LITHOTRIPSY    . PERCUTANEOUS CORONARY STENT INTERVENTION (PCI-S) N/A 04/24/2012   Procedure: PERCUTANEOUS CORONARY STENT INTERVENTION (PCI-S);  Surgeon: Sherren Mocha, MD;  Location: [facility] CATH LAB;  Service: Cardiovascular;  Laterality: N/A;  . PERCUTANEOUS CORONARY STENT INTERVENTION (PCI-S)  02/15/2013   Procedure: PERCUTANEOUS CORONARY STENT INTERVENTION (PCI-S);  Surgeon: Sherren Mocha, MD;  Location: [facility] CATH LAB;  Service: Cardiovascular;;   Social History:  reports that he has never smoked. He has never used smokeless tobacco. He reports that he does not drink alcohol or use drugs.   History reviewed. No pertinent family history.   No Known Allergies   Prior to Admission medications   Medication Sig Start Date End Date Taking? Authorizing Provider  acetaminophen (TYLENOL) 500 MG tablet Take 500-1,000 mg by mouth every 6 (six) hours as needed for mild pain or moderate pain.    [provider]  amLODipine (NORVASC) 5 MG tablet TAKE 1 TABLET DAILY 12/25/13   Sherren Mocha, MD  aspirin EC 81 MG tablet Take 81 mg by mouth daily.    [provider]  atorvastatin (LIPITOR) 40 MG tablet Take 40 mg by mouth daily.    [provider]  clopidogrel (PLAVIX) 75 MG tablet TAKE 1 TABLET (75 MG TOTAL) DAILY WITH BREAKFAST. 12/25/13   Sherren Mocha, MD  cyclobenzaprine (FLEXERIL) 10 MG tablet Take 10 mg by mouth 3 (three) times daily as needed for muscle spasms.  10/11/18   [provider]  docusate sodium (COLACE) 50 MG capsule Take 50 mg by  mouth daily.    [provider]  isosorbide mononitrate (IMDUR) 120 MG 24 hr tablet Take 1 tablet (120 mg total) by mouth daily. 08/24/17   Richardson Dopp T, PA-C  lisinopril (PRINIVIL,ZESTRIL) 20 MG tablet Take 20 mg daily by mouth.    [provider]  metoprolol tartrate (LOPRESSOR) 25 MG tablet TAKE 1 TABLET TWICE A Cordella Register, Legrand Como, MD  nitroGLYCERIN (NITROSTAT) 0.4 MG SL tablet Place 1 tablet (0.4 mg total) under the tongue every 5 (five) minutes x 3 doses as needed for chest pain. 04/25/12   Theora Gianotti, NP  pantoprazole (PROTONIX) 40 MG tablet Take 1 tablet by mouth once daily 11/26/19   Sherren Mocha, MD    Review of Systems:  Constitutional:  No weight loss, night sweats Head&Eyes: No headache.  No vision loss.  No eye pain or scotoma ENT:  No Difficulty swallowing,Tooth/dental problems,Sore throat,  No ear ache, post nasal drip,  Cardio-vascular:  No chest pain, Orthopnea, PND, swelling in lower extremities,  dizziness, palpitations  GI:  No  abdominal pain,diarrhea, loss of appetite, hematochezia, melena, heartburn, indigestion, Resp:   No cough. No coughing up of blood .No  wheezing.No chest wall deformity  Skin:  no rash or lesions.  GU:  no dysuria, change in color of urine, no urgency or frequency. No flank pain.  Musculoskeletal:  No joint pain or swelling. No decreased range of motion. No back pain.  Psych:  No change in mood or affect. No depression or anxiety. Neurologic: No headache, no dysesthesia, no focal weakness, no vision loss. No syncope  Physical Exam: Vitals:   02/01/20 0530 02/01/20 0600 02/01/20 0630 02/01/20 0745  BP: 129/68 124/70 118/76 92/62  Pulse: (!) 119 (!) 115 (!) 112 (!) 113  Resp: (!) 36 (!) 36 (!) 37 (!) 35  Temp: 100 F (37.8 C)     TempSrc: Oral     SpO2: 98% 96% 97% 96%  Weight:      Height:       General:  A&O x 3, NAD, nontoxic, pleasant/cooperative Head/Eye: No conjunctival hemorrhage, no  icterus, [place]/AT, No nystagmus ENT:  No icterus,  No thrush, good dentition, no pharyngeal exudate Neck:  No masses, no lymphadenpathy, no bruits CV:  RRR, no rub, no gallop, no S3 Lung:  CTAB, good air movement, no wheeze, no rhonchi Abdomen: soft/NT, +BS, nondistended, no peritoneal signs Ext: No cyanosis, No rashes, No petechiae, No lymphangitis, No edema Neuro: CNII-XII intact, strength 4/5 in bilateral upper and lower extremities, no dysmetria  Labs on Admission:  Basic Metabolic Panel: Recent Labs  Lab 02/01/20 0444  NA 134*  K 4.2  CL 102  CO2 17*  GLUCOSE 159*  BUN 16  CREATININE 0.91  CALCIUM 8.5*   Liver Function Tests: Recent Labs  Lab 02/01/20 0444  AST 26  ALT 25  ALKPHOS 99  BILITOT 1.2  PROT 7.4  ALBUMIN 4.3   No results for input(s): LIPASE, AMYLASE in the last 168 hours. No results for input(s): AMMONIA in the last 168 hours. CBC: Recent Labs  Lab 02/01/20 0444  WBC 7.6  NEUTROABS 6.4  HGB 15.1  HCT 45.7  MCV 90.3  PLT 244   Coagulation Profile: Recent Labs  Lab 02/01/20 0444  INR 1.1   Cardiac Enzymes: No results for input(s): CKTOTAL, CKMB, CKMBINDEX, TROPONINI in the last 168 hours. BNP: Invalid input(s): POCBNP CBG: No results for input(s): GLUCAP in the last 168 hours. Urine analysis:    Component Value Date/Time   COLORURINE YELLOW 02/01/2020 0611   APPEARANCEUR CLOUDY (A) 02/01/2020 0611   LABSPEC 1.011 02/01/2020 0611   PHURINE 5.0 02/01/2020 0611   GLUCOSEU NEGATIVE 02/01/2020 0611   HGBUR LARGE (A) 02/01/2020 0611   BILIRUBINUR NEGATIVE 02/01/2020 0611   KETONESUR NEGATIVE 02/01/2020 0611   PROTEINUR NEGATIVE 02/01/2020 0611   UROBILINOGEN 2.0 (H) 04/22/2012 1905   NITRITE NEGATIVE 02/01/2020 0611   LEUKOCYTESUR TRACE (A) 02/01/2020 0611   Sepsis Labs: @LABRCNTIP (procalcitonin:4,lacticidven:4) ) Recent Results (from the past 240 hour(s))  Blood Culture (routine x 2)     Status: None (Preliminary result)    Collection Time: 02/01/20  4:44 AM   Specimen: BLOOD RIGHT WRIST  Result Value Ref Range Status   Specimen Description   Final    BLOOD RIGHT WRIST BOTTLES DRAWN AEROBIC AND ANAEROBIC   Special Requests   Final    Blood Culture adequate volume Performed at [facility], [address] [place], [place] [address]    Culture PENDING  Incomplete   Report Status PENDING  Incomplete  Blood Culture (routine x 2)     Status: None (Preliminary result)   Collection Time:  02/01/20  5:00 AM   Specimen: BLOOD  Result Value Ref Range Status   Specimen Description BLOOD RIGHT ARM  Final   Special Requests   Final    BOTTLES DRAWN AEROBIC AND ANAEROBIC Blood Culture adequate volume Performed at [facility], [address], [place], [place] [address]    Culture PENDING  Incomplete   Report Status PENDING  Incomplete     Radiological Exams on Admission: CT Angio Chest PE W and/or Wo Contrast  Result Date: 02/01/2020 CLINICAL DATA:  Fever, chest pain, shortness of breath, abdominal and flank pain for 2 hours. EXAM: CT ANGIOGRAPHY CHEST CT ABDOMEN AND PELVIS WITH CONTRAST TECHNIQUE: Multidetector CT imaging of the chest was performed using the standard protocol during bolus administration of intravenous contrast. Multiplanar CT image reconstructions and MIPs were obtained to evaluate the vascular anatomy. Multidetector CT imaging of the abdomen and pelvis was performed using the standard protocol during bolus administration of intravenous contrast. CONTRAST:  161mL OMNIPAQUE IOHEXOL 350 MG/ML SOLN COMPARISON:  CT scan from 2005 FINDINGS: CTA CHEST FINDINGS Cardiovascular: The heart is normal in size. No pericardial effusion. Mildly prominent epicardial and pericardial fat. The aorta is normal in caliber. No dissection or atherosclerotic calcifications. There are however significant age advanced three-vessel coronary artery calcifications. The pulmonary arterial tree is suboptimally opacified. No obvious large  central pulmonary emboli. Small distal pulmonary emboli could be missed. Mediastinum/Nodes: Small scattered mediastinal and hilar lymph nodes are noted. No mass or overt adenopathy. The esophagus is grossly normal. Lungs/Pleura: No acute pulmonary findings. No infiltrates, edema or effusions. No worrisome pulmonary lesions. Musculoskeletal: No chest wall mass, supraclavicular or axillary adenopathy. The bony thorax is intact. Review of the MIP images confirms the above findings. CT ABDOMEN and PELVIS FINDINGS Hepatobiliary: No focal hepatic lesions or intrahepatic biliary dilatation. The gallbladder is normal. No common bile duct dilatation. Pancreas: Slightly prominent fatty interstices but no mass, inflammation or ductal dilatation. Spleen: Normal size. No focal lesions. Adrenals/Urinary Tract: The adrenal glands are normal. Horseshoe kidney deformity noted. No worrisome renal lesions. Left-sided parapelvic cyst is noted. 7.5 x 5.0 mm calculus noted in the left renal pelvis. No obstructing ureteral calculi or bladder calculi. There or left-sided posterior bladder diverticulum noted. No bladder mass. Stomach/Bowel: The stomach, duodenum, small bowel and colon are unremarkable. No acute inflammatory process, mass lesion or obstructive findings. The terminal ileum is normal. The appendix is not identified and may be surgically absent. Vascular/Lymphatic: Moderate atherosclerotic calcifications involving the aorta and iliac arteries. The branch vessels are patent. The major venous structures are patent. Small scattered mesenteric and retroperitoneal lymph nodes but no mass or adenopathy. Reproductive: The prostate gland and seminal vesicles are unremarkable. Other: No pelvic mass or adenopathy. No free pelvic fluid collections. No inguinal mass or adenopathy. No abdominal wall hernia or subcutaneous lesions. Musculoskeletal: No significant bony findings. Review of the MIP images confirms the above findings.  IMPRESSION: 1. No CT findings for aortic dissection or aneurysm. No thoracic but moderate abdominal aortic calcifications. 2. No large central pulmonary emboli. 3. Age advanced three-vessel coronary artery calcifications. 4. No acute pulmonary findings. 5. No acute abdominal/pelvic findings, mass lesion or lymphadenopathy. 6. Horseshoe kidney deformity with 7.5 x 5.0 mm calculus in the left renal pelvis but no obstructing ureteral calculi or bladder calculi. 7. Left-sided bladder diverticuli. Electronically Signed   By: Marijo Sanes M.D.   On: 02/01/2020 06:55   CT ABDOMEN PELVIS W CONTRAST  Result Date: 02/01/2020 CLINICAL DATA:  Fever, chest pain, shortness of breath, abdominal and flank pain for 2 hours. EXAM: CT ANGIOGRAPHY CHEST CT ABDOMEN AND PELVIS WITH CONTRAST TECHNIQUE: Multidetector CT imaging of the chest was performed using the standard protocol during bolus administration of intravenous contrast. Multiplanar CT image reconstructions and MIPs were obtained to evaluate the vascular anatomy. Multidetector CT imaging of the abdomen and pelvis was performed using the standard protocol during bolus administration of intravenous contrast. CONTRAST:  158mL OMNIPAQUE IOHEXOL 350 MG/ML SOLN COMPARISON:  CT scan from 2005 FINDINGS: CTA CHEST FINDINGS Cardiovascular: The heart is normal in size. No pericardial effusion. Mildly prominent epicardial and pericardial fat. The aorta is normal in caliber. No dissection or atherosclerotic calcifications. There are however significant age advanced three-vessel coronary artery calcifications. The pulmonary arterial tree is suboptimally opacified. No obvious large central pulmonary emboli. Small distal pulmonary emboli could be missed. Mediastinum/Nodes: Small scattered mediastinal and hilar lymph nodes are noted. No mass or overt adenopathy. The esophagus is grossly normal. Lungs/Pleura: No acute pulmonary findings. No infiltrates, edema or effusions. No worrisome  pulmonary lesions. Musculoskeletal: No chest wall mass, supraclavicular or axillary adenopathy. The bony thorax is intact. Review of the MIP images confirms the above findings. CT ABDOMEN and PELVIS FINDINGS Hepatobiliary: No focal hepatic lesions or intrahepatic biliary dilatation. The gallbladder is normal. No common bile duct dilatation. Pancreas: Slightly prominent fatty interstices but no mass, inflammation or ductal dilatation. Spleen: Normal size. No focal lesions. Adrenals/Urinary Tract: The adrenal glands are normal. Horseshoe kidney deformity noted. No worrisome renal lesions. Left-sided parapelvic cyst is noted. 7.5 x 5.0 mm calculus noted in the left renal pelvis. No obstructing ureteral calculi or bladder calculi. There or left-sided posterior bladder diverticulum noted. No bladder mass. Stomach/Bowel: The stomach, duodenum, small bowel and colon are unremarkable. No acute inflammatory process, mass lesion or obstructive findings. The terminal ileum is normal. The appendix is not identified and may be surgically absent. Vascular/Lymphatic: Moderate atherosclerotic calcifications involving the aorta and iliac arteries. The branch vessels are patent. The major venous structures are patent. Small scattered mesenteric and retroperitoneal lymph nodes but no mass or adenopathy. Reproductive: The prostate gland and seminal vesicles are unremarkable. Other: No pelvic mass or adenopathy. No free pelvic fluid collections. No inguinal mass or adenopathy. No abdominal wall hernia or subcutaneous lesions. Musculoskeletal: No significant bony findings. Review of the MIP images confirms the above findings. IMPRESSION: 1. No CT findings for aortic dissection or aneurysm. No thoracic but moderate abdominal aortic calcifications. 2. No large central pulmonary emboli. 3. Age advanced three-vessel coronary artery calcifications. 4. No acute pulmonary findings. 5. No acute abdominal/pelvic findings, mass lesion or  lymphadenopathy. 6. Horseshoe kidney deformity with 7.5 x 5.0 mm calculus in the left renal pelvis but no obstructing ureteral calculi or bladder calculi. 7. Left-sided bladder diverticuli. Electronically Signed   By: Marijo Sanes M.D.   On: 02/01/2020 06:55   DG Chest Port 1 View  Result Date: 02/01/2020 CLINICAL DATA:  Fever EXAM: PORTABLE CHEST 1 VIEW COMPARISON:  08/28/2012 FINDINGS: Normal heart size and mediastinal contours. No acute infiltrate or edema. No effusion or pneumothorax. No acute osseous findings. IMPRESSION: No active disease. Electronically Signed   By: Monte Fantasia M.D.   On: 02/01/2020 04:40        Time spent:60 minutes Code Status:   FULL Family Communication:  No Family at bedside Disposition Plan: expect 2-3 day hospitalization Consults called: none DVT Prophylaxis: [place] Lovenox  Orson Eva, DO  [facility] Pager  [phone]  If 7PM-7AM, please contact night-coverage [email or web address] Password [facility] 02/01/2020, 7:46 AM

## 2020-02-01 NOTE — Progress Notes (Signed)
Pharmacy Antibiotic Note  Kyle Lambert is a 69 y.o. male admitted on 02/01/2020 with sepsis.  Pharmacy has been consulted for vancomycin and cefepime  dosing.  Plan: Loading dose:  vancomycin 2 g  IV x1 dose Maintenance dose:  vancomycin   1.75g IV  q12h Predicted AUC: 502.4 mcg*h/mL using SCr 0.91mg /dL Pharmacy will continue to monitor renal function, vancomycin levels as clinically appropriate,  cultures and patient progress.  Height: 5\' 9"  (175.3 cm) Weight: 254 lb (115.2 kg) IBW/kg (Calculated) : 70.7  Temp (24hrs), Avg:100.7 F (38.2 C), Min:100 F (37.8 C), Max:101.4 F (38.6 C)  Recent Labs  Lab 02/01/20 0444 02/01/20 0641  WBC 7.6  --   CREATININE 0.91  --   LATICACIDVEN 4.4* 4.3*    Estimated Creatinine Clearance: 95.9 mL/min (by C-G formula based on SCr of 0.91 mg/dL).    No Known Allergies   Antimicrobials this admission:  vancomycin 3/12 >>  ceftriaxone 3/12>> 3/12 cefepime 3/12>>    Microbiology results: 3/12  [facility] x2:  3/12 UCx:   3/11 Resp PCR: SARS CoV-2: neg      Flu A/B:  Thank you for allowing pharmacy to be a part of this patient's care.  Despina Pole 02/01/2020 11:56 AM

## 2020-02-01 NOTE — ED Notes (Signed)
Patient transported to CT 

## 2020-02-01 NOTE — ED Triage Notes (Signed)
Patient complains of painful urination, and dark urine with flank pain that started 2 hours ago. Patient has had a fever of 100.9 oral with nausea and vomiting

## 2020-02-01 NOTE — ED Provider Notes (Signed)
Emergency Department Provider Note   I have reviewed the triage vital signs and the nursing notes.   HISTORY  Chief Complaint Flank Pain (emesis)   HPI Kyle Lambert is a 69 y.o. male with medical problems as documented below who presents the emergency department today with chills and vomiting.  Patient states that he woke up approximately 0 100 feeling this way.  He is and multiple episodes of dysuria in the last 24 hours as well.  He states he has a history of had a kidney infection and felt similar to this.  EMS was called he is febrile tachycardic they gave him some Zofran which seemed to help a little bit but still vomited some stomach contents.  No bilious or bloody vomit.  No diarrhea or constipation.  No significant abdominal pain has some right-sided back pain but also knows he has a kidney stone there.  No rashes.  No sick contacts.  Has coughed once but no persistent productive cough.   No other associated or modifying symptoms.    Past Medical History:  Diagnosis Date  . Arthritis   . CAD (coronary artery disease)    a. 03/2012 Cath: LAD 80, RCA 80, nl EF;  b. PCI of RCA w 3.0x63mm Promus DES, FFR of LAD was nl @ 0.85 ->Med Rx.;  c. LHC 02/15/13: proximal LAD 40-50%, mid LAD 95%, small D1 95-99%, mid CFX 30-40%, proximal RCA stent patent with ostial 20-30% prior to the stent, ostial PDA 50%, EF 55-65%. PCI:  Promus DES to the mid LAD and POBA to the proximal D1   . Hyperlipidemia   . Hypertension   . Obesity     Patient Active Problem List   Diagnosis Date Noted  . Sepsis due to undetermined organism ([place]) 02/01/2020  . Pyelonephritis   . Obesity 05/16/2012  . CAD (coronary artery disease) 04/25/2012  . Hyperlipidemia 04/25/2012  . HTN (hypertension) 04/21/2012    Past Surgical History:  Procedure Laterality Date  . CORONARY ANGIOPLASTY WITH STENT PLACEMENT  04/24/12   DES-RCA  . CORONARY ANGIOPLASTY WITH STENT PLACEMENT  02/15/13   Patent RCA stent, diffuse  nonobstructive R PDA, LCx disease, 99% mid LAD stenosis s/p DES-mid LAD; LVEF 60%  . CORONARY STENT PLACEMENT    . HERNIA REPAIR Left inguinal  . LEFT HEART CATH AND CORONARY ANGIOGRAPHY N/A 01/31/2019   Procedure: LEFT HEART CATH AND CORONARY ANGIOGRAPHY;  Surgeon: Sherren Mocha, MD;  Location: [place] [facility];  Service: Cardiovascular;  Laterality: N/A;  . LEFT HEART CATHETERIZATION WITH CORONARY ANGIOGRAM N/A 04/21/2012   Procedure: LEFT HEART CATHETERIZATION WITH CORONARY ANGIOGRAM;  Surgeon: Hillary Bow, MD;  Location: [facility] CATH LAB;  Service: Cardiovascular;  Laterality: N/A;  . LEFT HEART CATHETERIZATION WITH CORONARY ANGIOGRAM N/A 02/15/2013   Procedure: LEFT HEART CATHETERIZATION WITH CORONARY ANGIOGRAM;  Surgeon: Sherren Mocha, MD;  Location: [facility] CATH LAB;  Service: Cardiovascular;  Laterality: N/A;  . LITHOTRIPSY    . PERCUTANEOUS CORONARY STENT INTERVENTION (PCI-S) N/A 04/24/2012   Procedure: PERCUTANEOUS CORONARY STENT INTERVENTION (PCI-S);  Surgeon: Sherren Mocha, MD;  Location: [facility] CATH LAB;  Service: Cardiovascular;  Laterality: N/A;  . PERCUTANEOUS CORONARY STENT INTERVENTION (PCI-S)  02/15/2013   Procedure: PERCUTANEOUS CORONARY STENT INTERVENTION (PCI-S);  Surgeon: Sherren Mocha, MD;  Location: [facility] CATH LAB;  Service: Cardiovascular;;      Allergies Patient has no known allergies.  History reviewed. No pertinent family history.  Social History Social History   Tobacco Use  .  Smoking status: Never Smoker  . Smokeless tobacco: Never Used  Substance Use Topics  . Alcohol use: No    Comment: " quit along time ago"  . Drug use: No    Review of Systems  All other systems negative except as documented in the HPI. All pertinent positives and negatives as reviewed in the HPI. ____________________________________________   PHYSICAL EXAM:  VITAL SIGNS: ED Triage Vitals  Enc Vitals Group     BP 02/01/20 0407 103/67     Pulse Rate 02/01/20 0407 (!) 128     Resp  02/01/20 0407 18     Temp 02/01/20 0407 (!) 101.4 F (38.6 C)     Temp Source 02/01/20 0407 Oral     SpO2 02/01/20 0407 93 %     Weight 02/01/20 0409 254 lb (115.2 kg)     Height 02/01/20 0409 5\' 9"  (1.753 m)    Constitutional: Alert and oriented. Well appearing and in no acute distress. Eyes: Conjunctivae are normal. PERRL. EOMI. Head: Atraumatic. Nose: No congestion/rhinnorhea. Mouth/Throat: Mucous membranes are moist.  Oropharynx non-erythematous. Neck: No stridor.  No meningeal signs.   Cardiovascular: tachycardic rate, regular rhythm. Good peripheral circulation. Grossly normal heart sounds.   Respiratory: tachypneic respiratory effort.  No retractions. Lungs CTAB. Gastrointestinal: Soft and nontender. No distention.  Musculoskeletal: No lower extremity tenderness nor edema. No gross deformities of extremities. Neurologic:  Normal speech and language. No gross focal neurologic deficits are appreciated.  Skin:  Skin is warm, diaphoretic but is intact. No rash noted.  ____________________________________________   LABS (all labs ordered are listed, but only abnormal results are displayed)  Labs Reviewed  LACTIC ACID, PLASMA - Abnormal; Notable for the following components:      Result Value   Lactic Acid, Venous 4.4 (*)    All other components within normal limits  LACTIC ACID, PLASMA - Abnormal; Notable for the following components:   Lactic Acid, Venous 4.3 (*)    All other components within normal limits  COMPREHENSIVE METABOLIC PANEL - Abnormal; Notable for the following components:   Sodium 134 (*)    CO2 17 (*)    Glucose, Bld 159 (*)    Calcium 8.5 (*)    All other components within normal limits  URINALYSIS, ROUTINE W REFLEX MICROSCOPIC - Abnormal; Notable for the following components:   APPearance CLOUDY (*)    Hgb urine dipstick LARGE (*)    Leukocytes,Ua TRACE (*)    RBC / HPF >50 (*)    Bacteria, UA RARE (*)    All other components within normal limits    HEMOGLOBIN A1C - Abnormal; Notable for the following components:   Hgb A1c MFr Bld 6.3 (*)    All other components within normal limits  LACTIC ACID, PLASMA - Abnormal; Notable for the following components:   Lactic Acid, Venous 2.9 (*)    All other components within normal limits  LACTIC ACID, PLASMA - Abnormal; Notable for the following components:   Lactic Acid, Venous 3.0 (*)    All other components within normal limits  CBC - Abnormal; Notable for the following components:   WBC 12.5 (*)    All other components within normal limits  CULTURE, BLOOD (ROUTINE X 2)  CULTURE, BLOOD (ROUTINE X 2)  SARS CORONAVIRUS 2 (TAT 6-24 HRS)  URINE CULTURE  MRSA PCR SCREENING  CBC WITH DIFFERENTIAL/PLATELET  APTT  PROTIME-INR  HIV ANTIBODY (ROUTINE TESTING W REFLEX)  FIBRINOGEN  PROCALCITONIN  BASIC METABOLIC PANEL  POC SARS CORONAVIRUS 2 AG -  ED   ____________________________________________  EKG   EKG Interpretation  Date/Time:  Friday February 01 2020 04:10:10 EST Ventricular Rate:  126 PR Interval:    QRS Duration: 89 QT Interval:  308 QTC Calculation: 446 R Axis:   97 Text Interpretation: Sinus tachycardia Atrial premature complex Right axis deviation No significant change since last tracing Confirmed by Merrily Pew [phone]) on 02/02/2020 7:46:17 AM       ____________________________________________  RADIOLOGY  No results found.  ____________________________________________   PROCEDURES  Procedure(s) performed:   .Critical Care Performed by: Merrily Pew, MD Authorized by: Merrily Pew, MD   Critical care provider statement:    Critical care time (minutes):  45   Critical care was necessary to treat or prevent imminent or life-threatening deterioration of the following conditions:  Sepsis   Critical care was time spent personally by me on the following activities:  Discussions with consultants, evaluation of patient's response to treatment, examination of  patient, ordering and performing treatments and interventions, ordering and review of laboratory studies, ordering and review of radiographic studies, pulse oximetry, re-evaluation of patient's condition, obtaining history from patient or surrogate and review of old charts   ____________________________________________   INITIAL IMPRESSION / ASSESSMENT AND PLAN / ED COURSE  Febrile, tachycardic, tachypneic. Likely UTI/pyelo. Code sepsis initiated. Rocephin, lr bolus ordered. Will hold on large volume fluid resuscitation until lactic and response to antipyretics is assessed.    Clinical Course as of Feb 01 745  Fri Feb 01, 2020  [id number] Another liter of fluids. Antibiotics already given. Blood pressure holding.  Lactic Acid, Venous(!!): 4.4 [JM]  0526 C/w dehydration/lactic acid elevation  CO2(!): 17 [JM]    Clinical Course User Index [JM] Decarla Siemen, Corene Cornea, MD   Lactic still elevated so another liter given. BP's stable. MS stable.   Pertinent labs & imaging results that were available during my care of the patient were reviewed by me and considered in my medical decision making (see chart for details).  A medical screening exam was performed and I feel the patient has had an appropriate workup for their chief complaint at this time and likelihood of emergent condition existing is low. They have been counseled on decision, discharge, follow up and which symptoms necessitate immediate return to the emergency department. They or their family verbally stated understanding and agreement with plan and discharged in stable condition.   ____________________________________________  FINAL CLINICAL IMPRESSION(S) / ED DIAGNOSES  Final diagnoses:  Pyelonephritis  Sepsis, due to unspecified organism, unspecified whether acute organ dysfunction present [facility])    MEDICATIONS GIVEN DURING THIS VISIT:  Medications  0.9 %  sodium chloride infusion ( Intravenous New Bag/Given 02/01/20 0924)  aspirin EC  tablet 81 mg (81 mg Oral Given 02/01/20 1715)  atorvastatin (LIPITOR) tablet 40 mg (40 mg Oral Given 02/01/20 1714)  metoprolol tartrate (LOPRESSOR) tablet 12.5 mg (12.5 mg Oral Given 02/01/20 2258)  isosorbide mononitrate (IMDUR) 24 hr tablet 30 mg (30 mg Oral Given 02/01/20 1714)  pantoprazole (PROTONIX) EC tablet 40 mg (40 mg Oral Given 02/01/20 1715)  clopidogrel (PLAVIX) tablet 75 mg (75 mg Oral Given 02/01/20 1715)  acetaminophen (TYLENOL) tablet 650 mg (has no administration in time range)    Or  acetaminophen (TYLENOL) suppository 650 mg (has no administration in time range)  ondansetron (ZOFRAN) tablet 4 mg (has no administration in time range)    Or  ondansetron (ZOFRAN) injection 4 mg (has no administration in time range)  0.9 %  sodium chloride infusion ( Intravenous New Bag/Given 02/01/20 2053)  ceFEPIme (MAXIPIME) 2 g in sodium chloride 0.9 % 100 mL IVPB (2 g Intravenous New Bag/Given 02/02/20 0427)  vancomycin (VANCOREADY) IVPB 1750 mg/350 mL (1,750 mg Intravenous New Bag/Given 02/02/20 0026)  enoxaparin (LOVENOX) injection 60 mg (60 mg Subcutaneous Given 02/01/20 1724)  lactated ringers bolus 1,000 mL (1,000 mLs Intravenous New Bag/Given 02/01/20 0423)  acetaminophen (TYLENOL) tablet 1,000 mg (1,000 mg Oral Given 02/01/20 0422)  lactated ringers bolus 1,000 mL (0 mLs Intravenous Stopped 02/01/20 0528)  iohexol (OMNIPAQUE) 350 MG/ML injection 100 mL (100 mLs Intravenous Contrast Given 02/01/20 0616)  lactated ringers bolus 1,000 mL (0 mLs Intravenous Stopped 02/01/20 0750)  cefTRIAXone (ROCEPHIN) 1 g in sodium chloride 0.9 % 100 mL IVPB (0 g Intravenous Stopped 02/01/20 1208)  vancomycin (VANCOREADY) IVPB 2000 mg/400 mL (0 mg Intravenous Stopped 02/01/20 1208)  sodium chloride 0.9 % bolus 500 mL (500 mLs Intravenous New Bag/Given 02/01/20 1811)     NEW OUTPATIENT MEDICATIONS STARTED DURING THIS VISIT:  Current Discharge Medication List      Note:  This note was prepared with assistance  of Dragon voice recognition software. Occasional wrong-word or sound-a-like substitutions may have occurred due to the inherent limitations of voice recognition software.   Kaydence Menard, Corene Cornea, MD 02/02/20 [phone]

## 2020-02-02 LAB — CBC
HCT: 40.7 % (ref 39.0–52.0)
Hemoglobin: 13.1 g/dL (ref 13.0–17.0)
MCH: 29.4 pg (ref 26.0–34.0)
MCHC: 32.2 g/dL (ref 30.0–36.0)
MCV: 91.5 fL (ref 80.0–100.0)
Platelets: 166 10*3/uL (ref 150–400)
RBC: 4.45 MIL/uL (ref 4.22–5.81)
RDW: 13.4 % (ref 11.5–15.5)
WBC: 12.5 10*3/uL — ABNORMAL HIGH (ref 4.0–10.5)
nRBC: 0 % (ref 0.0–0.2)

## 2020-02-02 LAB — BASIC METABOLIC PANEL
Anion gap: 7 (ref 5–15)
BUN: 13 mg/dL (ref 8–23)
CO2: 22 mmol/L (ref 22–32)
Calcium: 8 mg/dL — ABNORMAL LOW (ref 8.9–10.3)
Chloride: 105 mmol/L (ref 98–111)
Creatinine, Ser: 0.73 mg/dL (ref 0.61–1.24)
GFR calc Af Amer: >60 mL/min (ref >60)
GFR calc non Af Amer: >60 mL/min (ref >60)
Glucose, Bld: 106 mg/dL — ABNORMAL HIGH (ref 70–99)
Potassium: 3.8 mmol/L (ref 3.5–5.1)
Sodium: 134 mmol/L — ABNORMAL LOW (ref 135–145)

## 2020-02-02 LAB — URINE CULTURE: Culture: <10000 — AB

## 2020-02-02 MED ORDER — METOPROLOL TARTRATE 25 MG PO TABS
25.0000 mg | ORAL_TABLET | Freq: Two times a day (BID) | ORAL | Status: DC
  Administered 2020-02-02 – 2020-02-03 (×2): 25 mg via ORAL
  Filled 2020-02-02 (×2): qty 1

## 2020-02-02 MED ORDER — ISOSORBIDE MONONITRATE ER 60 MG PO TB24
60.0000 mg | ORAL_TABLET | Freq: Every day | ORAL | Status: DC
  Administered 2020-02-03: 60 mg via ORAL
  Filled 2020-02-02: qty 1

## 2020-02-02 MED ORDER — VANCOMYCIN HCL 1500 MG/300ML IV SOLN
1500.0000 mg | Freq: Two times a day (BID) | INTRAVENOUS | Status: DC
  Administered 2020-02-02 – 2020-02-03 (×2): 1500 mg via INTRAVENOUS
  Filled 2020-02-02 (×2): qty 300

## 2020-02-02 MED ORDER — ISOSORBIDE MONONITRATE ER 60 MG PO TB24
30.0000 mg | ORAL_TABLET | Freq: Once | ORAL | Status: AC
  Administered 2020-02-02: 30 mg via ORAL
  Filled 2020-02-02: qty 1

## 2020-02-02 NOTE — Progress Notes (Signed)
PROGRESS NOTE  LLEYTON BAISA [id number] DOB: Apr 16, 1951 DOA: 02/01/2020 PCP: Curlene Labrum, MD  Brief History:  69 y.o. male with medical history of with coronary disease, hypertension, hyperlipidemia presented with acute onset of fevers and chills when he woke up in the early morning of 02/01/2020.  He took his temperature at home and it was 101.0 F.  Patient also developed some shortness of breath.  Interestingly, the patient stated that he had been in his usual state of health without any complaints prior to the early morning of 02/01/2020.   The patient had associated nausea and vomiting x3 episodes without any blood.  He was given Zofran by EMS.  He also complained of some dysuria and hematuria in the early a.m. 3/12.  He complains of chronic right flank/lower back pain which she has had for 6 months, but stated it was slightly worse. In the emergency department, the patient was febrile up to 101.4 F with tachycardia into the 120s.  Patient has soft blood pressures with SBP in the 100s.  Oxygen saturation was 96-90% room air.  BMP sodium 134, potassium 4.2, serum creatinine 0.91, bicarbonate 17.  LFTs were unremarkable.  WBC 7.6, hemoglobin 15.1, platelets 244,000.  Lactic acid was 4.4.  CT angiogram chest was negative for pulmonary embolus.  Did not show any infiltrates or edema.  There was shotty mediastinal lymph nodes.  CT abdomen/pelvis showed a left horseshoe kidney with left intraparenchymal renal stone without obstruction.  There was shotty retroperitoneal lymph nodes.  The patient was started on ceftriaxone and fluid resuscitated.  Admission was requested for further treatment and work-up.  Assessment/Plan:  Sepsis -Present on admission -Likely due to urinary source, but concerned about bacteremia -Lactic acid peaked 4.4 -Empiric vancomycin and cefepime pending culture data -Check procalcitonin--35.22 -Chest x-ray without any infiltrates or edema -CT  chest/abdomen/pelvis as above -UA 11-20 WBC -follow blood and urine culture -SARS-CoV2--neg  Pyelonephritis -urine cultures obtained after abx initially given -continue empiric vanc/cefepime -urine culture unrevealing  Dyspnea -Likely secondary to metabolic acidosis -CTA chest negative PE, edema, infiltrates -Stable on room air -improved with IVF  Lactic acidosis -Lactic acid peaked 4.4  Coronary artery disease -No chest pain presently -Continue lower dose Imdur due to soft blood pressures -Continue aspirin Plavix -increase metoprolol back to home dose  Essential hypertension -Holding amlodipine secondary to soft blood pressure -Continue lower dose Imdur -increase metoprolol back to home dose  Hyperlipidemia -Continue statin  Obesity -BMI 37.51 -lifestyle modfication      Disposition Plan: Patient From: Home D/C Place: Home - 1-2  Days Barriers: Not Clinically Stable--remains on IV abx, await final cultures  Family Communication:   Family at bedside  Consultants:  none  Code Status:  FULL   DVT Prophylaxis:  [place] Lovenox   Procedures: As Listed in Progress Note Above  Antibiotics: vanco 3/12>>> Cefepime 3/12>>>     Subjective: Pt is feeling better.  Sob is improving but still a little bit sob with exertion.  Patient denies fevers, chills, headache, chest pain, nausea, vomiting, diarrhea, abdominal pain, dysuria, hematuria, hematochezia, and melena.   Objective: Vitals:   02/02/20 0030 02/02/20 0235 02/02/20 0429 02/02/20 0838  BP: 110/80 126/77 122/73 115/80  Pulse: 80 84 81 84  Resp: 20 20 20    Temp: 99.3 F (37.4 C) 99.5 F (37.5 C) 99.3 F (37.4 C)   TempSrc: Oral Oral Oral   SpO2: 94% 94% 96% 95%  Weight:      Height:        Intake/Output Summary (Last 24 hours) at 02/02/2020 0917 Last data filed at 02/02/2020 0842 Gross per 24 hour  Intake 3463.96 ml  Output 1800 ml  Net 1663.96 ml   Weight change: -1.114  kg Exam:   General:  Pt is alert, follows commands appropriately, not in acute distress  HEENT: No icterus, No thrush, No neck mass, [place]/AT  Cardiovascular: RRR, S1/S2, no rubs, no gallops  Respiratory: CTA bilaterally, no wheezing, no crackles, no rhonchi  Abdomen: Soft/+BS, non tender, non distended, no guarding  Extremities: No edema, No lymphangitis, No petechiae, No rashes, no synovitis   Data Reviewed: I have personally reviewed following labs and imaging studies Basic Metabolic Panel: Recent Labs  Lab 02/01/20 0444 02/02/20 0556  NA 134* 134*  K 4.2 3.8  CL 102 105  CO2 17* 22  GLUCOSE 159* 106*  BUN 16 13  CREATININE 0.91 0.73  CALCIUM 8.5* 8.0*   Liver Function Tests: Recent Labs  Lab 02/01/20 0444  AST 26  ALT 25  ALKPHOS 99  BILITOT 1.2  PROT 7.4  ALBUMIN 4.3   No results for input(s): LIPASE, AMYLASE in the last 168 hours. No results for input(s): AMMONIA in the last 168 hours. Coagulation Profile: Recent Labs  Lab 02/01/20 0444  INR 1.1   CBC: Recent Labs  Lab 02/01/20 0444 02/02/20 0556  WBC 7.6 12.5*  NEUTROABS 6.4  --   HGB 15.1 13.1  HCT 45.7 40.7  MCV 90.3 91.5  PLT 244 166   Cardiac Enzymes: No results for input(s): CKTOTAL, CKMB, CKMBINDEX, TROPONINI in the last 168 hours. BNP: Invalid input(s): POCBNP CBG: No results for input(s): GLUCAP in the last 168 hours. HbA1C: Recent Labs    02/01/20 1558  HGBA1C 6.3*   Urine analysis:    Component Value Date/Time   COLORURINE YELLOW 02/01/2020 0611   APPEARANCEUR CLOUDY (A) 02/01/2020 0611   LABSPEC 1.011 02/01/2020 0611   PHURINE 5.0 02/01/2020 0611   GLUCOSEU NEGATIVE 02/01/2020 0611   HGBUR LARGE (A) 02/01/2020 0611   BILIRUBINUR NEGATIVE 02/01/2020 0611   KETONESUR NEGATIVE 02/01/2020 0611   PROTEINUR NEGATIVE 02/01/2020 0611   UROBILINOGEN 2.0 (H) 04/22/2012 1905   NITRITE NEGATIVE 02/01/2020 0611   LEUKOCYTESUR TRACE (A) 02/01/2020 0611   Sepsis  Labs: @LABRCNTIP (procalcitonin:4,lacticidven:4) ) Recent Results (from the past 240 hour(s))  Blood Culture (routine x 2)     Status: None (Preliminary result)   Collection Time: 02/01/20  4:44 AM   Specimen: BLOOD RIGHT WRIST  Result Value Ref Range Status   Specimen Description   Final    BLOOD RIGHT WRIST BOTTLES DRAWN AEROBIC AND ANAEROBIC   Special Requests Blood Culture adequate volume  Final   Culture   Final    NO GROWTH 1 DAY Performed at [facility], [address], [place], [place] [address]    Report Status PENDING  Incomplete  Blood Culture (routine x 2)     Status: None (Preliminary result)   Collection Time: 02/01/20  5:00 AM   Specimen: BLOOD  Result Value Ref Range Status   Specimen Description BLOOD RIGHT ARM  Final   Special Requests   Final    BOTTLES DRAWN AEROBIC AND ANAEROBIC Blood Culture adequate volume   Culture   Final    NO GROWTH 1 DAY Performed at [facility], [address], [place], [place] [address]    Report Status PENDING  Incomplete  Urine culture     Status: Abnormal   Collection Time: 02/01/20  6:11 AM   Specimen: Urine, Catheterized  Result Value Ref Range Status   Specimen Description   Final    URINE, CATHETERIZED Performed at [facility], [address] [place], [place] [address]    Special Requests   Final    NONE Performed at [facility], [address] [place], [place] [address]    Culture (A)  Final    <10,000 COLONIES/mL INSIGNIFICANT GROWTH Performed at [facility] [address], [place], [place] [address]    Report Status 02/02/2020 FINAL  Final  SARS CORONAVIRUS 2 ([place] 6-24 HRS) Nasopharyngeal Nasopharyngeal Swab     Status: None   Collection Time: 02/01/20  7:48 AM   Specimen: Nasopharyngeal Swab  Result Value Ref Range Status   SARS Coronavirus 2 NEGATIVE NEGATIVE Final    Comment: (NOTE) SARS-CoV-2 target nucleic acids are NOT DETECTED. The SARS-CoV-2 RNA is generally detectable in upper and  lower respiratory specimens during the acute phase of infection. Negative results do not preclude SARS-CoV-2 infection, do not rule out co-infections with other pathogens, and should not be used as the sole basis for treatment or other patient management decisions. Negative results must be combined with clinical observations, patient history, and epidemiological information. The expected result is Negative. Fact Sheet for Patients: SugarRoll.be Fact Sheet for Healthcare Providers: https://www.woods-mathews.com/ This test is not yet approved or cleared by the [place] FDA and  has been authorized for detection and/or diagnosis of SARS-CoV-2 by FDA under an Emergency Use Authorization (EUA). This EUA will remain  in effect (meaning this test can be used) for the duration of the COVID-19 declaration under Section 56 4(b)(1) of the Act, 21 U.S.C. section 360bbb-3(b)(1), unless the authorization is terminated or revoked sooner. Performed at [facility], [place] [address] [place],  [address]      Scheduled Meds: . aspirin EC  81 mg Oral Daily  . atorvastatin  40 mg Oral Daily  . clopidogrel  75 mg Oral Daily  . enoxaparin (LOVENOX) injection  60 mg Subcutaneous Q24H  . isosorbide mononitrate  30 mg Oral Daily  . metoprolol tartrate  12.5 mg Oral BID  . pantoprazole  40 mg Oral Daily   Continuous Infusions: . sodium chloride 100 mL/hr at 02/01/20 0924  . sodium chloride 100 mL/hr at 02/01/20 2053  . ceFEPime (MAXIPIME) IV 2 g (02/02/20 0427)  . vancomycin 1,750 mg (02/02/20 [id number])    Procedures/Studies: CT Angio Chest PE W and/or Wo Contrast  Result Date: 02/01/2020 CLINICAL DATA:  Fever, chest pain, shortness of breath, abdominal and flank pain for 2 hours. EXAM: CT ANGIOGRAPHY CHEST CT ABDOMEN AND PELVIS WITH CONTRAST TECHNIQUE: Multidetector CT imaging of the chest was performed using the standard protocol during bolus  administration of intravenous contrast. Multiplanar CT image reconstructions and MIPs were obtained to evaluate the vascular anatomy. Multidetector CT imaging of the abdomen and pelvis was performed using the standard protocol during bolus administration of intravenous contrast. CONTRAST:  144mL OMNIPAQUE IOHEXOL 350 MG/ML SOLN COMPARISON:  CT scan from 2005 FINDINGS: CTA CHEST FINDINGS Cardiovascular: The heart is normal in size. No pericardial effusion. Mildly prominent epicardial and pericardial fat. The aorta is normal in caliber. No dissection or atherosclerotic calcifications. There are however significant age advanced three-vessel coronary artery calcifications. The pulmonary arterial tree is suboptimally opacified. No obvious large central pulmonary emboli. Small distal pulmonary emboli could be  missed. Mediastinum/Nodes: Small scattered mediastinal and hilar lymph nodes are noted. No mass or overt adenopathy. The esophagus is grossly normal. Lungs/Pleura: No acute pulmonary findings. No infiltrates, edema or effusions. No worrisome pulmonary lesions. Musculoskeletal: No chest wall mass, supraclavicular or axillary adenopathy. The bony thorax is intact. Review of the MIP images confirms the above findings. CT ABDOMEN and PELVIS FINDINGS Hepatobiliary: No focal hepatic lesions or intrahepatic biliary dilatation. The gallbladder is normal. No common bile duct dilatation. Pancreas: Slightly prominent fatty interstices but no mass, inflammation or ductal dilatation. Spleen: Normal size. No focal lesions. Adrenals/Urinary Tract: The adrenal glands are normal. Horseshoe kidney deformity noted. No worrisome renal lesions. Left-sided parapelvic cyst is noted. 7.5 x 5.0 mm calculus noted in the left renal pelvis. No obstructing ureteral calculi or bladder calculi. There or left-sided posterior bladder diverticulum noted. No bladder mass. Stomach/Bowel: The stomach, duodenum, small bowel and colon are unremarkable. No  acute inflammatory process, mass lesion or obstructive findings. The terminal ileum is normal. The appendix is not identified and may be surgically absent. Vascular/Lymphatic: Moderate atherosclerotic calcifications involving the aorta and iliac arteries. The branch vessels are patent. The major venous structures are patent. Small scattered mesenteric and retroperitoneal lymph nodes but no mass or adenopathy. Reproductive: The prostate gland and seminal vesicles are unremarkable. Other: No pelvic mass or adenopathy. No free pelvic fluid collections. No inguinal mass or adenopathy. No abdominal wall hernia or subcutaneous lesions. Musculoskeletal: No significant bony findings. Review of the MIP images confirms the above findings. IMPRESSION: 1. No CT findings for aortic dissection or aneurysm. No thoracic but moderate abdominal aortic calcifications. 2. No large central pulmonary emboli. 3. Age advanced three-vessel coronary artery calcifications. 4. No acute pulmonary findings. 5. No acute abdominal/pelvic findings, mass lesion or lymphadenopathy. 6. Horseshoe kidney deformity with 7.5 x 5.0 mm calculus in the left renal pelvis but no obstructing ureteral calculi or bladder calculi. 7. Left-sided bladder diverticuli. Electronically Signed   By: Marijo Sanes M.D.   On: 02/01/2020 06:55   CT ABDOMEN PELVIS W CONTRAST  Result Date: 02/01/2020 CLINICAL DATA:  Fever, chest pain, shortness of breath, abdominal and flank pain for 2 hours. EXAM: CT ANGIOGRAPHY CHEST CT ABDOMEN AND PELVIS WITH CONTRAST TECHNIQUE: Multidetector CT imaging of the chest was performed using the standard protocol during bolus administration of intravenous contrast. Multiplanar CT image reconstructions and MIPs were obtained to evaluate the vascular anatomy. Multidetector CT imaging of the abdomen and pelvis was performed using the standard protocol during bolus administration of intravenous contrast. CONTRAST:  133mL OMNIPAQUE IOHEXOL 350  MG/ML SOLN COMPARISON:  CT scan from 2005 FINDINGS: CTA CHEST FINDINGS Cardiovascular: The heart is normal in size. No pericardial effusion. Mildly prominent epicardial and pericardial fat. The aorta is normal in caliber. No dissection or atherosclerotic calcifications. There are however significant age advanced three-vessel coronary artery calcifications. The pulmonary arterial tree is suboptimally opacified. No obvious large central pulmonary emboli. Small distal pulmonary emboli could be missed. Mediastinum/Nodes: Small scattered mediastinal and hilar lymph nodes are noted. No mass or overt adenopathy. The esophagus is grossly normal. Lungs/Pleura: No acute pulmonary findings. No infiltrates, edema or effusions. No worrisome pulmonary lesions. Musculoskeletal: No chest wall mass, supraclavicular or axillary adenopathy. The bony thorax is intact. Review of the MIP images confirms the above findings. CT ABDOMEN and PELVIS FINDINGS Hepatobiliary: No focal hepatic lesions or intrahepatic biliary dilatation. The gallbladder is normal. No common bile duct dilatation. Pancreas: Slightly prominent fatty interstices but no mass, inflammation  or ductal dilatation. Spleen: Normal size. No focal lesions. Adrenals/Urinary Tract: The adrenal glands are normal. Horseshoe kidney deformity noted. No worrisome renal lesions. Left-sided parapelvic cyst is noted. 7.5 x 5.0 mm calculus noted in the left renal pelvis. No obstructing ureteral calculi or bladder calculi. There or left-sided posterior bladder diverticulum noted. No bladder mass. Stomach/Bowel: The stomach, duodenum, small bowel and colon are unremarkable. No acute inflammatory process, mass lesion or obstructive findings. The terminal ileum is normal. The appendix is not identified and may be surgically absent. Vascular/Lymphatic: Moderate atherosclerotic calcifications involving the aorta and iliac arteries. The branch vessels are patent. The major venous structures are  patent. Small scattered mesenteric and retroperitoneal lymph nodes but no mass or adenopathy. Reproductive: The prostate gland and seminal vesicles are unremarkable. Other: No pelvic mass or adenopathy. No free pelvic fluid collections. No inguinal mass or adenopathy. No abdominal wall hernia or subcutaneous lesions. Musculoskeletal: No significant bony findings. Review of the MIP images confirms the above findings. IMPRESSION: 1. No CT findings for aortic dissection or aneurysm. No thoracic but moderate abdominal aortic calcifications. 2. No large central pulmonary emboli. 3. Age advanced three-vessel coronary artery calcifications. 4. No acute pulmonary findings. 5. No acute abdominal/pelvic findings, mass lesion or lymphadenopathy. 6. Horseshoe kidney deformity with 7.5 x 5.0 mm calculus in the left renal pelvis but no obstructing ureteral calculi or bladder calculi. 7. Left-sided bladder diverticuli. Electronically Signed   By: Marijo Sanes M.D.   On: 02/01/2020 06:55   DG Chest Port 1 View  Result Date: 02/01/2020 CLINICAL DATA:  Fever EXAM: PORTABLE CHEST 1 VIEW COMPARISON:  08/28/2012 FINDINGS: Normal heart size and mediastinal contours. No acute infiltrate or edema. No effusion or pneumothorax. No acute osseous findings. IMPRESSION: No active disease. Electronically Signed   By: Monte Fantasia M.D.   On: 02/01/2020 04:40    Orson Eva, DO  [facility]  If 7PM-7AM, please contact night-coverage [email or web address] Password [facility] 02/02/2020, 9:17 AM   LOS: 1 day

## 2020-02-03 LAB — CBC
HCT: 41.2 % (ref 39.0–52.0)
Hemoglobin: 13.1 g/dL (ref 13.0–17.0)
MCH: 29.2 pg (ref 26.0–34.0)
MCHC: 31.8 g/dL (ref 30.0–36.0)
MCV: 91.8 fL (ref 80.0–100.0)
Platelets: 176 10*3/uL (ref 150–400)
RBC: 4.49 MIL/uL (ref 4.22–5.81)
RDW: 13.3 % (ref 11.5–15.5)
WBC: 7 10*3/uL (ref 4.0–10.5)
nRBC: 0 % (ref 0.0–0.2)

## 2020-02-03 LAB — BASIC METABOLIC PANEL
Anion gap: 7 (ref 5–15)
BUN: 12 mg/dL (ref 8–23)
CO2: 22 mmol/L (ref 22–32)
Calcium: 8.2 mg/dL — ABNORMAL LOW (ref 8.9–10.3)
Chloride: 104 mmol/L (ref 98–111)
Creatinine, Ser: 0.71 mg/dL (ref 0.61–1.24)
GFR calc Af Amer: >60 mL/min (ref >60)
GFR calc non Af Amer: >60 mL/min (ref >60)
Glucose, Bld: 125 mg/dL — ABNORMAL HIGH (ref 70–99)
Potassium: 4 mmol/L (ref 3.5–5.1)
Sodium: 133 mmol/L — ABNORMAL LOW (ref 135–145)

## 2020-02-03 LAB — MAGNESIUM: Magnesium: 1.8 mg/dL (ref 1.7–2.4)

## 2020-02-03 MED ORDER — CEFDINIR 300 MG PO CAPS: 300.0000 mg | ORAL_CAPSULE | Freq: Two times a day (BID) | ORAL | 0 refills | Status: DC

## 2020-02-03 MED ORDER — CEFDINIR 300 MG PO CAPS
300.0000 mg | ORAL_CAPSULE | Freq: Two times a day (BID) | ORAL | Status: DC
  Administered 2020-02-03: 300 mg via ORAL
  Filled 2020-02-03: qty 1

## 2020-02-03 NOTE — Progress Notes (Signed)
Nsg Discharge Note  Admit Date:  02/01/2020 Discharge date: 02/03/2020   ALTHA GILLEO to be D/C'd Home per MD order.  AVS completed.  Copy for chart, and copy for patient signed, and dated. Patient/caregiver able to verbalize understanding.  Discharge Medication: Allergies as of 02/03/2020   No Known Allergies     Medication List    TAKE these medications   acetaminophen 500 MG tablet Commonly known as: TYLENOL Take 1,000 mg by mouth every 6 (six) hours as needed for mild pain, moderate pain or headache.   amLODipine 5 MG tablet Commonly known as: NORVASC TAKE 1 TABLET DAILY   aspirin EC 81 MG tablet Take 81 mg by mouth daily.   atorvastatin 40 MG tablet Commonly known as: LIPITOR Take 40 mg by mouth at bedtime.   cefdinir 300 MG capsule Commonly known as: OMNICEF Take 1 capsule (300 mg total) by mouth every 12 (twelve) hours.   clopidogrel 75 MG tablet Commonly known as: PLAVIX TAKE 1 TABLET (75 MG TOTAL) DAILY WITH BREAKFAST. What changed: See the new instructions.   cyclobenzaprine 10 MG tablet Commonly known as: FLEXERIL Take 10 mg by mouth 3 (three) times daily as needed for muscle spasms.   isosorbide mononitrate 120 MG 24 hr tablet Commonly known as: Imdur Take 1 tablet (120 mg total) by mouth daily.   lisinopril 20 MG tablet Commonly known as: ZESTRIL Take 20 mg daily by mouth.   metFORMIN 500 MG tablet Commonly known as: GLUCOPHAGE Take 500 mg by mouth 2 (two) times daily.   metoprolol tartrate 25 MG tablet Commonly known as: LOPRESSOR TAKE 1 TABLET TWICE A DAY   nitroGLYCERIN 0.4 MG SL tablet Commonly known as: NITROSTAT Place 1 tablet (0.4 mg total) under the tongue every 5 (five) minutes x 3 doses as needed for chest pain.   pantoprazole 40 MG tablet Commonly known as: PROTONIX Take 1 tablet by mouth once daily   tolterodine 2 MG 24 hr capsule Commonly known as: DETROL LA Take 2 mg by mouth daily.       Discharge  Assessment: Vitals:   02/02/20 2113 02/03/20 0529  BP: 131/88 126/86  Pulse: 76 61  Resp: 20 20  Temp: 98.7 F (37.1 C) 98.9 F (37.2 C)  SpO2: 97% 97%   Skin clean, dry and intact without evidence of skin break down, no evidence of skin tears noted. IV catheter discontinued intact. Site without signs and symptoms of complications - no redness or edema noted at insertion site, patient denies c/o pain - only slight tenderness at site.  Dressing with slight pressure applied.  D/c Instructions-Education: Discharge instructions given to patient/family with verbalized understanding. D/c education completed with patient/family including follow up instructions, medication list, d/c activities limitations if indicated, with other d/c instructions as indicated by MD - patient able to verbalize understanding, all questions fully answered. Patient instructed to return to ED, call 911, or call MD for any changes in condition.  Patient escorted via [place], and D/C home via private auto.  Loa Socks, RN 02/03/2020 1:43 PM

## 2020-02-03 NOTE — Discharge Summary (Signed)
Physician Discharge Summary  Kyle Lambert [id number] DOB: 1950-12-05 DOA: 02/01/2020  PCP: Curlene Labrum, MD  Admit date: 02/01/2020 Discharge date: 02/03/2020  Admitted From: Home Disposition:  Home   Recommendations for Outpatient Follow-up:  1. Follow up with PCP in 1-2 weeks 2. Please obtain BMP/CBC in one week   Discharge Condition: Stable CODE STATUS: FULL Diet recommendation: Heart Healthy   Brief/Interim Summary: 69 y.o.malewith medical history ofwith coronary disease, hypertension, hyperlipidemia presented with acute onset of fevers and chills when he woke up in the early morning of 02/01/2020. He took his temperature at home and it was 101.0 F. Patient also developed some shortness of breath. Interestingly, the patient stated that he had been in his usual state of health without any complaints prior to the early morning of 02/01/2020.  The patient had associated nausea and vomiting x3 episodes without any blood. He was given Zofran by EMS. He also complained of some dysuria and hematuria in the early a.m. 3/12. He complains of chronic right flank/lower back pain which she has had for 6 months, but stated it was slightly worse. In the emergency department, the patient was febrile up to 101.4 F with tachycardia into the 120s. Patient has soft blood pressures with SBP in the 100s. Oxygen saturation was 96-90% room air. BMPsodium 134, potassium 4.2, serum creatinine 0.91, bicarbonate 17. LFTs were unremarkable. WBC 7.6, hemoglobin 15.1, platelets 244,000. Lactic acid was 4.4. CT angiogram chest was negative for pulmonary embolus. Did not show any infiltrates or edema. There was shotty mediastinal lymph nodes. CT abdomen/pelvis showed a left horseshoe kidney with left intraparenchymal renal stone without obstruction. There was shotty retroperitoneal lymph nodes. The patient was started on ceftriaxone and fluid resuscitated. Admission was requested for further  treatment and work-up.  He was started on IVF and IV vanc and cefepime pending culture data.  Blood cultures were negative and urine culture was nondiagnostic.  Sepsis physiology resolved and patient was de-escalated to po abx  Discharge Diagnoses:  Sepsis -Present on admission -Likely due to urinary source, but concerned about bacteremia -Lactic acid peaked 4.4 -Empiric vancomycin and cefepime pending culture data -Check procalcitonin--35.22 -Chest x-ray without any infiltrates or edema -CT chest/abdomen/pelvis as above -UA 11-20 WBC -follow blood and urine culture -blood cultures neg at time of d/c -SARS-CoV2--neg -sepsis physiology resolved  Pyelonephritis -urine cultures obtained after abx initially given -continue empiric vanc/cefepime intially -urine culture unrevealing but obtained after antibiotics were given -d/c home with cefdinir x 8 more days to complete 10 days tx  Dyspnea -Likely secondary to metabolic acidosis -CTA chest negative PE, edema, infiltrates -Stable on room air -improved with IVF  Lactic acidosis -Lactic acid peaked 4.4  Coronary artery disease -No chest pain presently -Continue lower dose Imdur due to soft blood pressures -Continue aspirin Plavix -increase metoprolol back to home dose  Essential hypertension -Holding amlodipine secondary to soft blood pressure initially -Continue lower dose Imdur initially -increase metoprolol back to home dose  Hyperlipidemia -Continue statin  Obesity -BMI 37.51 -lifestyle modfication  Discharge Instructions   Allergies as of 02/03/2020   No Known Allergies     Medication List    TAKE these medications   acetaminophen 500 MG tablet Commonly known as: TYLENOL Take 1,000 mg by mouth every 6 (six) hours as needed for mild pain, moderate pain or headache.   amLODipine 5 MG tablet Commonly known as: NORVASC TAKE 1 TABLET DAILY   aspirin EC 81 MG tablet Take 81 mg by  mouth daily.    atorvastatin 40 MG tablet Commonly known as: LIPITOR Take 40 mg by mouth at bedtime.   cefdinir 300 MG capsule Commonly known as: OMNICEF Take 1 capsule (300 mg total) by mouth every 12 (twelve) hours.   clopidogrel 75 MG tablet Commonly known as: PLAVIX TAKE 1 TABLET (75 MG TOTAL) DAILY WITH BREAKFAST. What changed: See the new instructions.   cyclobenzaprine 10 MG tablet Commonly known as: FLEXERIL Take 10 mg by mouth 3 (three) times daily as needed for muscle spasms.   isosorbide mononitrate 120 MG 24 hr tablet Commonly known as: Imdur Take 1 tablet (120 mg total) by mouth daily.   lisinopril 20 MG tablet Commonly known as: ZESTRIL Take 20 mg daily by mouth.   metFORMIN 500 MG tablet Commonly known as: GLUCOPHAGE Take 500 mg by mouth 2 (two) times daily.   metoprolol tartrate 25 MG tablet Commonly known as: LOPRESSOR TAKE 1 TABLET TWICE A DAY   nitroGLYCERIN 0.4 MG SL tablet Commonly known as: NITROSTAT Place 1 tablet (0.4 mg total) under the tongue every 5 (five) minutes x 3 doses as needed for chest pain.   pantoprazole 40 MG tablet Commonly known as: PROTONIX Take 1 tablet by mouth once daily   tolterodine 2 MG 24 hr capsule Commonly known as: DETROL LA Take 2 mg by mouth daily.       No Known Allergies  Consultations:  none   Procedures/Studies: CT Angio Chest PE W and/or Wo Contrast  Result Date: 02/01/2020 CLINICAL DATA:  Fever, chest pain, shortness of breath, abdominal and flank pain for 2 hours. EXAM: CT ANGIOGRAPHY CHEST CT ABDOMEN AND PELVIS WITH CONTRAST TECHNIQUE: Multidetector CT imaging of the chest was performed using the standard protocol during bolus administration of intravenous contrast. Multiplanar CT image reconstructions and MIPs were obtained to evaluate the vascular anatomy. Multidetector CT imaging of the abdomen and pelvis was performed using the standard protocol during bolus administration of intravenous contrast. CONTRAST:   152mL OMNIPAQUE IOHEXOL 350 MG/ML SOLN COMPARISON:  CT scan from 2005 FINDINGS: CTA CHEST FINDINGS Cardiovascular: The heart is normal in size. No pericardial effusion. Mildly prominent epicardial and pericardial fat. The aorta is normal in caliber. No dissection or atherosclerotic calcifications. There are however significant age advanced three-vessel coronary artery calcifications. The pulmonary arterial tree is suboptimally opacified. No obvious large central pulmonary emboli. Small distal pulmonary emboli could be missed. Mediastinum/Nodes: Small scattered mediastinal and hilar lymph nodes are noted. No mass or overt adenopathy. The esophagus is grossly normal. Lungs/Pleura: No acute pulmonary findings. No infiltrates, edema or effusions. No worrisome pulmonary lesions. Musculoskeletal: No chest wall mass, supraclavicular or axillary adenopathy. The bony thorax is intact. Review of the MIP images confirms the above findings. CT ABDOMEN and PELVIS FINDINGS Hepatobiliary: No focal hepatic lesions or intrahepatic biliary dilatation. The gallbladder is normal. No common bile duct dilatation. Pancreas: Slightly prominent fatty interstices but no mass, inflammation or ductal dilatation. Spleen: Normal size. No focal lesions. Adrenals/Urinary Tract: The adrenal glands are normal. Horseshoe kidney deformity noted. No worrisome renal lesions. Left-sided parapelvic cyst is noted. 7.5 x 5.0 mm calculus noted in the left renal pelvis. No obstructing ureteral calculi or bladder calculi. There or left-sided posterior bladder diverticulum noted. No bladder mass. Stomach/Bowel: The stomach, duodenum, small bowel and colon are unremarkable. No acute inflammatory process, mass lesion or obstructive findings. The terminal ileum is normal. The appendix is not identified and may be surgically absent. Vascular/Lymphatic: Moderate atherosclerotic calcifications involving  the aorta and iliac arteries. The branch vessels are patent. The  major venous structures are patent. Small scattered mesenteric and retroperitoneal lymph nodes but no mass or adenopathy. Reproductive: The prostate gland and seminal vesicles are unremarkable. Other: No pelvic mass or adenopathy. No free pelvic fluid collections. No inguinal mass or adenopathy. No abdominal wall hernia or subcutaneous lesions. Musculoskeletal: No significant bony findings. Review of the MIP images confirms the above findings. IMPRESSION: 1. No CT findings for aortic dissection or aneurysm. No thoracic but moderate abdominal aortic calcifications. 2. No large central pulmonary emboli. 3. Age advanced three-vessel coronary artery calcifications. 4. No acute pulmonary findings. 5. No acute abdominal/pelvic findings, mass lesion or lymphadenopathy. 6. Horseshoe kidney deformity with 7.5 x 5.0 mm calculus in the left renal pelvis but no obstructing ureteral calculi or bladder calculi. 7. Left-sided bladder diverticuli. Electronically Signed   By: Marijo Sanes M.D.   On: 02/01/2020 06:55   CT ABDOMEN PELVIS W CONTRAST  Result Date: 02/01/2020 CLINICAL DATA:  Fever, chest pain, shortness of breath, abdominal and flank pain for 2 hours. EXAM: CT ANGIOGRAPHY CHEST CT ABDOMEN AND PELVIS WITH CONTRAST TECHNIQUE: Multidetector CT imaging of the chest was performed using the standard protocol during bolus administration of intravenous contrast. Multiplanar CT image reconstructions and MIPs were obtained to evaluate the vascular anatomy. Multidetector CT imaging of the abdomen and pelvis was performed using the standard protocol during bolus administration of intravenous contrast. CONTRAST:  150mL OMNIPAQUE IOHEXOL 350 MG/ML SOLN COMPARISON:  CT scan from 2005 FINDINGS: CTA CHEST FINDINGS Cardiovascular: The heart is normal in size. No pericardial effusion. Mildly prominent epicardial and pericardial fat. The aorta is normal in caliber. No dissection or atherosclerotic calcifications. There are however  significant age advanced three-vessel coronary artery calcifications. The pulmonary arterial tree is suboptimally opacified. No obvious large central pulmonary emboli. Small distal pulmonary emboli could be missed. Mediastinum/Nodes: Small scattered mediastinal and hilar lymph nodes are noted. No mass or overt adenopathy. The esophagus is grossly normal. Lungs/Pleura: No acute pulmonary findings. No infiltrates, edema or effusions. No worrisome pulmonary lesions. Musculoskeletal: No chest wall mass, supraclavicular or axillary adenopathy. The bony thorax is intact. Review of the MIP images confirms the above findings. CT ABDOMEN and PELVIS FINDINGS Hepatobiliary: No focal hepatic lesions or intrahepatic biliary dilatation. The gallbladder is normal. No common bile duct dilatation. Pancreas: Slightly prominent fatty interstices but no mass, inflammation or ductal dilatation. Spleen: Normal size. No focal lesions. Adrenals/Urinary Tract: The adrenal glands are normal. Horseshoe kidney deformity noted. No worrisome renal lesions. Left-sided parapelvic cyst is noted. 7.5 x 5.0 mm calculus noted in the left renal pelvis. No obstructing ureteral calculi or bladder calculi. There or left-sided posterior bladder diverticulum noted. No bladder mass. Stomach/Bowel: The stomach, duodenum, small bowel and colon are unremarkable. No acute inflammatory process, mass lesion or obstructive findings. The terminal ileum is normal. The appendix is not identified and may be surgically absent. Vascular/Lymphatic: Moderate atherosclerotic calcifications involving the aorta and iliac arteries. The branch vessels are patent. The major venous structures are patent. Small scattered mesenteric and retroperitoneal lymph nodes but no mass or adenopathy. Reproductive: The prostate gland and seminal vesicles are unremarkable. Other: No pelvic mass or adenopathy. No free pelvic fluid collections. No inguinal mass or adenopathy. No abdominal wall  hernia or subcutaneous lesions. Musculoskeletal: No significant bony findings. Review of the MIP images confirms the above findings. IMPRESSION: 1. No CT findings for aortic dissection or aneurysm. No thoracic but moderate abdominal  aortic calcifications. 2. No large central pulmonary emboli. 3. Age advanced three-vessel coronary artery calcifications. 4. No acute pulmonary findings. 5. No acute abdominal/pelvic findings, mass lesion or lymphadenopathy. 6. Horseshoe kidney deformity with 7.5 x 5.0 mm calculus in the left renal pelvis but no obstructing ureteral calculi or bladder calculi. 7. Left-sided bladder diverticuli. Electronically Signed   By: Marijo Sanes M.D.   On: 02/01/2020 06:55   DG Chest Port 1 View  Result Date: 02/01/2020 CLINICAL DATA:  Fever EXAM: PORTABLE CHEST 1 VIEW COMPARISON:  08/28/2012 FINDINGS: Normal heart size and mediastinal contours. No acute infiltrate or edema. No effusion or pneumothorax. No acute osseous findings. IMPRESSION: No active disease. Electronically Signed   By: Monte Fantasia M.D.   On: 02/01/2020 04:40        Discharge Exam: Vitals:   02/02/20 2113 02/03/20 0529  BP: 131/88 126/86  Pulse: 76 61  Resp: 20 20  Temp: 98.7 F (37.1 C) 98.9 F (37.2 C)  SpO2: 97% 97%   Vitals:   02/02/20 0838 02/02/20 1403 02/02/20 2113 02/03/20 0529  BP: 115/80 117/81 131/88 126/86  Pulse: 84 69 76 61  Resp:  18 20 20   Temp:  98 F (36.7 C) 98.7 F (37.1 C) 98.9 F (37.2 C)  TempSrc:   Oral   SpO2: 95% 99% 97% 97%  Weight:      Height:        General: Pt is alert, awake, not in acute distress Cardiovascular: RRR, S1/S2 +, no rubs, no gallops Respiratory: CTA bilaterally, no wheezing, no rhonchi Abdominal: Soft, NT, ND, bowel sounds + Extremities: no edema, no cyanosis   The results of significant diagnostics from this hospitalization (including imaging, microbiology, ancillary and laboratory) are listed below for reference.    Significant  Diagnostic Studies: CT Angio Chest PE W and/or Wo Contrast  Result Date: 02/01/2020 CLINICAL DATA:  Fever, chest pain, shortness of breath, abdominal and flank pain for 2 hours. EXAM: CT ANGIOGRAPHY CHEST CT ABDOMEN AND PELVIS WITH CONTRAST TECHNIQUE: Multidetector CT imaging of the chest was performed using the standard protocol during bolus administration of intravenous contrast. Multiplanar CT image reconstructions and MIPs were obtained to evaluate the vascular anatomy. Multidetector CT imaging of the abdomen and pelvis was performed using the standard protocol during bolus administration of intravenous contrast. CONTRAST:  15mL OMNIPAQUE IOHEXOL 350 MG/ML SOLN COMPARISON:  CT scan from 2005 FINDINGS: CTA CHEST FINDINGS Cardiovascular: The heart is normal in size. No pericardial effusion. Mildly prominent epicardial and pericardial fat. The aorta is normal in caliber. No dissection or atherosclerotic calcifications. There are however significant age advanced three-vessel coronary artery calcifications. The pulmonary arterial tree is suboptimally opacified. No obvious large central pulmonary emboli. Small distal pulmonary emboli could be missed. Mediastinum/Nodes: Small scattered mediastinal and hilar lymph nodes are noted. No mass or overt adenopathy. The esophagus is grossly normal. Lungs/Pleura: No acute pulmonary findings. No infiltrates, edema or effusions. No worrisome pulmonary lesions. Musculoskeletal: No chest wall mass, supraclavicular or axillary adenopathy. The bony thorax is intact. Review of the MIP images confirms the above findings. CT ABDOMEN and PELVIS FINDINGS Hepatobiliary: No focal hepatic lesions or intrahepatic biliary dilatation. The gallbladder is normal. No common bile duct dilatation. Pancreas: Slightly prominent fatty interstices but no mass, inflammation or ductal dilatation. Spleen: Normal size. No focal lesions. Adrenals/Urinary Tract: The adrenal glands are normal. Horseshoe  kidney deformity noted. No worrisome renal lesions. Left-sided parapelvic cyst is noted. 7.5 x 5.0 mm calculus noted  in the left renal pelvis. No obstructing ureteral calculi or bladder calculi. There or left-sided posterior bladder diverticulum noted. No bladder mass. Stomach/Bowel: The stomach, duodenum, small bowel and colon are unremarkable. No acute inflammatory process, mass lesion or obstructive findings. The terminal ileum is normal. The appendix is not identified and may be surgically absent. Vascular/Lymphatic: Moderate atherosclerotic calcifications involving the aorta and iliac arteries. The branch vessels are patent. The major venous structures are patent. Small scattered mesenteric and retroperitoneal lymph nodes but no mass or adenopathy. Reproductive: The prostate gland and seminal vesicles are unremarkable. Other: No pelvic mass or adenopathy. No free pelvic fluid collections. No inguinal mass or adenopathy. No abdominal wall hernia or subcutaneous lesions. Musculoskeletal: No significant bony findings. Review of the MIP images confirms the above findings. IMPRESSION: 1. No CT findings for aortic dissection or aneurysm. No thoracic but moderate abdominal aortic calcifications. 2. No large central pulmonary emboli. 3. Age advanced three-vessel coronary artery calcifications. 4. No acute pulmonary findings. 5. No acute abdominal/pelvic findings, mass lesion or lymphadenopathy. 6. Horseshoe kidney deformity with 7.5 x 5.0 mm calculus in the left renal pelvis but no obstructing ureteral calculi or bladder calculi. 7. Left-sided bladder diverticuli. Electronically Signed   By: Marijo Sanes M.D.   On: 02/01/2020 06:55   CT ABDOMEN PELVIS W CONTRAST  Result Date: 02/01/2020 CLINICAL DATA:  Fever, chest pain, shortness of breath, abdominal and flank pain for 2 hours. EXAM: CT ANGIOGRAPHY CHEST CT ABDOMEN AND PELVIS WITH CONTRAST TECHNIQUE: Multidetector CT imaging of the chest was performed using the  standard protocol during bolus administration of intravenous contrast. Multiplanar CT image reconstructions and MIPs were obtained to evaluate the vascular anatomy. Multidetector CT imaging of the abdomen and pelvis was performed using the standard protocol during bolus administration of intravenous contrast. CONTRAST:  133mL OMNIPAQUE IOHEXOL 350 MG/ML SOLN COMPARISON:  CT scan from 2005 FINDINGS: CTA CHEST FINDINGS Cardiovascular: The heart is normal in size. No pericardial effusion. Mildly prominent epicardial and pericardial fat. The aorta is normal in caliber. No dissection or atherosclerotic calcifications. There are however significant age advanced three-vessel coronary artery calcifications. The pulmonary arterial tree is suboptimally opacified. No obvious large central pulmonary emboli. Small distal pulmonary emboli could be missed. Mediastinum/Nodes: Small scattered mediastinal and hilar lymph nodes are noted. No mass or overt adenopathy. The esophagus is grossly normal. Lungs/Pleura: No acute pulmonary findings. No infiltrates, edema or effusions. No worrisome pulmonary lesions. Musculoskeletal: No chest wall mass, supraclavicular or axillary adenopathy. The bony thorax is intact. Review of the MIP images confirms the above findings. CT ABDOMEN and PELVIS FINDINGS Hepatobiliary: No focal hepatic lesions or intrahepatic biliary dilatation. The gallbladder is normal. No common bile duct dilatation. Pancreas: Slightly prominent fatty interstices but no mass, inflammation or ductal dilatation. Spleen: Normal size. No focal lesions. Adrenals/Urinary Tract: The adrenal glands are normal. Horseshoe kidney deformity noted. No worrisome renal lesions. Left-sided parapelvic cyst is noted. 7.5 x 5.0 mm calculus noted in the left renal pelvis. No obstructing ureteral calculi or bladder calculi. There or left-sided posterior bladder diverticulum noted. No bladder mass. Stomach/Bowel: The stomach, duodenum, small bowel  and colon are unremarkable. No acute inflammatory process, mass lesion or obstructive findings. The terminal ileum is normal. The appendix is not identified and may be surgically absent. Vascular/Lymphatic: Moderate atherosclerotic calcifications involving the aorta and iliac arteries. The branch vessels are patent. The major venous structures are patent. Small scattered mesenteric and retroperitoneal lymph nodes but no mass or adenopathy. Reproductive:  The prostate gland and seminal vesicles are unremarkable. Other: No pelvic mass or adenopathy. No free pelvic fluid collections. No inguinal mass or adenopathy. No abdominal wall hernia or subcutaneous lesions. Musculoskeletal: No significant bony findings. Review of the MIP images confirms the above findings. IMPRESSION: 1. No CT findings for aortic dissection or aneurysm. No thoracic but moderate abdominal aortic calcifications. 2. No large central pulmonary emboli. 3. Age advanced three-vessel coronary artery calcifications. 4. No acute pulmonary findings. 5. No acute abdominal/pelvic findings, mass lesion or lymphadenopathy. 6. Horseshoe kidney deformity with 7.5 x 5.0 mm calculus in the left renal pelvis but no obstructing ureteral calculi or bladder calculi. 7. Left-sided bladder diverticuli. Electronically Signed   By: Marijo Sanes M.D.   On: 02/01/2020 06:55   DG Chest Port 1 View  Result Date: 02/01/2020 CLINICAL DATA:  Fever EXAM: PORTABLE CHEST 1 VIEW COMPARISON:  08/28/2012 FINDINGS: Normal heart size and mediastinal contours. No acute infiltrate or edema. No effusion or pneumothorax. No acute osseous findings. IMPRESSION: No active disease. Electronically Signed   By: Monte Fantasia M.D.   On: 02/01/2020 04:40     Microbiology: Recent Results (from the past 240 hour(s))  Blood Culture (routine x 2)     Status: None (Preliminary result)   Collection Time: 02/01/20  4:44 AM   Specimen: BLOOD RIGHT WRIST  Result Value Ref Range Status    Specimen Description   Final    BLOOD RIGHT WRIST BOTTLES DRAWN AEROBIC AND ANAEROBIC   Special Requests Blood Culture adequate volume  Final   Culture   Final    NO GROWTH 2 DAYS Performed at [facility], [address], [place], [place] [address]    Report Status PENDING  Incomplete  Blood Culture (routine x 2)     Status: None (Preliminary result)   Collection Time: 02/01/20  5:00 AM   Specimen: BLOOD  Result Value Ref Range Status   Specimen Description BLOOD RIGHT ARM  Final   Special Requests   Final    BOTTLES DRAWN AEROBIC AND ANAEROBIC Blood Culture adequate volume   Culture   Final    NO GROWTH 2 DAYS Performed at [facility], [address] [place], [place] [address]    Report Status PENDING  Incomplete  Urine culture     Status: Abnormal   Collection Time: 02/01/20  6:11 AM   Specimen: Urine, Catheterized  Result Value Ref Range Status   Specimen Description   Final    URINE, CATHETERIZED Performed at [facility], [address], [place], [place] [address]    Special Requests   Final    NONE Performed at [facility], [address], [place], [place] [address]    Culture (A)  Final    <10,000 COLONIES/mL INSIGNIFICANT GROWTH Performed at [facility], [place] [address], [place],  [address]    Report Status 02/02/2020 FINAL  Final  SARS CORONAVIRUS 2 (Brisha Mccabe 6-24 HRS) Nasopharyngeal Nasopharyngeal Swab     Status: None   Collection Time: 02/01/20  7:48 AM   Specimen: Nasopharyngeal Swab  Result Value Ref Range Status   SARS Coronavirus 2 NEGATIVE NEGATIVE Final    Comment: (NOTE) SARS-CoV-2 target nucleic acids are NOT DETECTED. The SARS-CoV-2 RNA is generally detectable in upper and lower respiratory specimens during the acute phase of infection. Negative results do not preclude SARS-CoV-2 infection, do not rule out co-infections with other pathogens, and should not be used as the sole basis for treatment or  other patient management  decisions. Negative results must be combined with clinical observations, patient history, and epidemiological information. The expected result is Negative. Fact Sheet for Patients: SugarRoll.be Fact Sheet for Healthcare Providers: https://www.woods-mathews.com/ This test is not yet approved or cleared by the [place] FDA and  has been authorized for detection and/or diagnosis of SARS-CoV-2 by FDA under an Emergency Use Authorization (EUA). This EUA will remain  in effect (meaning this test can be used) for the duration of the COVID-19 declaration under Section 56 4(b)(1) of the Act, 21 U.S.C. section 360bbb-3(b)(1), unless the authorization is terminated or revoked sooner. Performed at [facility], [place] [address], [place], [place] [address]      Labs: Basic Metabolic Panel: Recent Labs  Lab 02/01/20 0444 02/01/20 0444 02/02/20 0556 02/03/20 0635  NA 134*  --  134* 133*  K 4.2   < > 3.8 4.0  CL 102  --  105 104  CO2 17*  --  22 22  GLUCOSE 159*  --  106* 125*  BUN 16  --  13 12  CREATININE 0.91  --  0.73 0.71  CALCIUM 8.5*  --  8.0* 8.2*  MG  --   --   --  1.8   < > = values in this interval not displayed.   Liver Function Tests: Recent Labs  Lab 02/01/20 0444  AST 26  ALT 25  ALKPHOS 99  BILITOT 1.2  PROT 7.4  ALBUMIN 4.3   No results for input(s): LIPASE, AMYLASE in the last 168 hours. No results for input(s): AMMONIA in the last 168 hours. CBC: Recent Labs  Lab 02/01/20 0444 02/02/20 0556 02/03/20 0635  WBC 7.6 12.5* 7.0  NEUTROABS 6.4  --   --   HGB 15.1 13.1 13.1  HCT 45.7 40.7 41.2  MCV 90.3 91.5 91.8  PLT 244 166 176   Cardiac Enzymes: No results for input(s): CKTOTAL, CKMB, CKMBINDEX, TROPONINI in the last 168 hours. BNP: Invalid input(s): POCBNP CBG: No results for input(s): GLUCAP in the last 168 hours.  Time coordinating discharge:  36 minutes  Signed:  Orson Eva, DO [facility] Pager: [phone] 02/03/2020, 11:20 AM

## 2020-02-03 NOTE — Treatment Plan (Signed)
Lab called with blood culture results.  Result showed gram positive cocci in anarobic bottle.   Dr. Humphrey Rolls called and informed patient of these results infront of me. Informed Kyle Lambert to follow up with his PCP in 3-5 days.

## 2020-02-06 LAB — CULTURE, BLOOD (ROUTINE X 2)
Culture: NO GROWTH
Special Requests: ADEQUATE
Special Requests: ADEQUATE

## 2020-02-07 DIAGNOSIS — N12 Tubulo-interstitial nephritis, not specified as acute or chronic: Secondary | ICD-10-CM | POA: Diagnosis not present

## 2020-02-07 DIAGNOSIS — R799 Abnormal finding of blood chemistry, unspecified: Secondary | ICD-10-CM | POA: Diagnosis not present

## 2020-02-07 DIAGNOSIS — A419 Sepsis, unspecified organism: Secondary | ICD-10-CM | POA: Diagnosis not present

## 2020-02-11 DIAGNOSIS — N3281 Overactive bladder: Secondary | ICD-10-CM | POA: Diagnosis not present

## 2020-02-11 DIAGNOSIS — I2510 Atherosclerotic heart disease of native coronary artery without angina pectoris: Secondary | ICD-10-CM | POA: Diagnosis not present

## 2020-02-11 DIAGNOSIS — Z6837 Body mass index (BMI) 37.0-37.9, adult: Secondary | ICD-10-CM | POA: Diagnosis not present

## 2020-02-11 DIAGNOSIS — R7303 Prediabetes: Secondary | ICD-10-CM | POA: Diagnosis not present

## 2020-02-11 DIAGNOSIS — Z0001 Encounter for general adult medical examination with abnormal findings: Secondary | ICD-10-CM | POA: Diagnosis not present

## 2020-02-11 DIAGNOSIS — Q631 Lobulated, fused and horseshoe kidney: Secondary | ICD-10-CM | POA: Diagnosis not present

## 2020-02-11 DIAGNOSIS — A419 Sepsis, unspecified organism: Secondary | ICD-10-CM | POA: Diagnosis not present

## 2020-02-11 DIAGNOSIS — I10 Essential (primary) hypertension: Secondary | ICD-10-CM | POA: Diagnosis not present

## 2020-02-15 NOTE — Progress Notes (Addendum)
Blood cultures resulted aerococcus urinae -pt was sent home with cefdinir -spoke with pt-->Patient denies fevers, chills, headache, chest pain, dyspnea, nausea, vomiting, diarrhea, abdominal pain, dysuria -additional rx  For amoxil 500 mg tid, #30 as it has lower MIC for this organism -called Rx to [facility] per patient request -discussed with patient who expressed understanding and agrees to take med  DTat

## 2020-03-04 DIAGNOSIS — N12 Tubulo-interstitial nephritis, not specified as acute or chronic: Secondary | ICD-10-CM | POA: Diagnosis not present

## 2020-03-04 DIAGNOSIS — N200 Calculus of kidney: Secondary | ICD-10-CM | POA: Diagnosis not present

## 2020-03-04 DIAGNOSIS — E872 Acidosis: Secondary | ICD-10-CM | POA: Diagnosis not present

## 2020-03-04 DIAGNOSIS — A419 Sepsis, unspecified organism: Secondary | ICD-10-CM | POA: Diagnosis not present

## 2020-03-04 DIAGNOSIS — Q631 Lobulated, fused and horseshoe kidney: Secondary | ICD-10-CM | POA: Diagnosis not present

## 2020-03-05 ENCOUNTER — Telehealth: Payer: Self-pay | Admitting: Urology

## 2020-03-05 NOTE — Telephone Encounter (Signed)
Scheduled 4/19

## 2020-03-05 NOTE — Telephone Encounter (Signed)
Pts wife called and requests a appt asap. She asked for a nurse to return her call regarding his issue.

## 2020-03-10 ENCOUNTER — Other Ambulatory Visit: Payer: Self-pay

## 2020-03-10 ENCOUNTER — Encounter: Payer: Self-pay | Admitting: Urology

## 2020-03-10 ENCOUNTER — Ambulatory Visit: Payer: PPO | Admitting: Urology

## 2020-03-10 VITALS — BP 132/93 | HR 85 | Temp 98.2°F | Ht 69.0 in | Wt 254.0 lb

## 2020-03-10 DIAGNOSIS — N159 Renal tubulo-interstitial disease, unspecified: Secondary | ICD-10-CM | POA: Diagnosis not present

## 2020-03-10 DIAGNOSIS — N200 Calculus of kidney: Secondary | ICD-10-CM

## 2020-03-10 LAB — BLADDER SCAN AMB NON-IMAGING: Scan Result: 64.8

## 2020-03-10 MED ORDER — OXYCODONE-ACETAMINOPHEN 5-325 MG PO TABS: 1.0000 | ORAL_TABLET | ORAL | 0 refills | Status: DC | PRN

## 2020-03-10 MED ORDER — TAMSULOSIN HCL 0.4 MG PO CAPS: 0.4000 mg | ORAL_CAPSULE | Freq: Every day | ORAL | 3 refills | Status: DC

## 2020-03-10 NOTE — Patient Instructions (Signed)

## 2020-03-10 NOTE — Progress Notes (Signed)
03/10/2020 3:09 PM   [name] 01/04/1951 [id number]  Referring provider: Curlene Labrum, MD [place],  [place] [address]  Back pain  HPI: Mr Seepersad is [phone] here for evaluation of back pain and nephrolithiasis. He developed severe back pain in 01/2020 and presented to the ER and was diagnosed with a 33mm left UPJ calculus. He has intermittent severe sharp nonraditing mid back pain. No hematuria. He has occasional urge incontinence.    PMH: Past Medical History:  Diagnosis Date  . Arthritis   . CAD (coronary artery disease)    a. 03/2012 Cath: LAD 80, RCA 80, nl EF;  b. PCI of RCA w 3.0x57mm Promus DES, FFR of LAD was nl @ 0.85 ->Med Rx.;  c. LHC 02/15/13: proximal LAD 40-50%, mid LAD 95%, small D1 95-99%, mid CFX 30-40%, proximal RCA stent patent with ostial 20-30% prior to the stent, ostial PDA 50%, EF 55-65%. PCI:  Promus DES to the mid LAD and POBA to the proximal D1   . Hyperlipidemia   . Hypertension   . Obesity     Surgical History: Past Surgical History:  Procedure Laterality Date  . CORONARY ANGIOPLASTY WITH STENT PLACEMENT  04/24/12   DES-RCA  . CORONARY ANGIOPLASTY WITH STENT PLACEMENT  02/15/13   Patent RCA stent, diffuse nonobstructive R PDA, LCx disease, 99% mid LAD stenosis s/p DES-mid LAD; LVEF 60%  . CORONARY STENT PLACEMENT    . HERNIA REPAIR Left inguinal  . LEFT HEART CATH AND CORONARY ANGIOGRAPHY N/A 01/31/2019   Procedure: LEFT HEART CATH AND CORONARY ANGIOGRAPHY;  Surgeon: Sherren Mocha, MD;  Location: [facility];  Service: Cardiovascular;  Laterality: N/A;  . LEFT HEART CATHETERIZATION WITH CORONARY ANGIOGRAM N/A 04/21/2012   Procedure: LEFT HEART CATHETERIZATION WITH CORONARY ANGIOGRAM;  Surgeon: Hillary Bow, MD;  Location: [facility] CATH LAB;  Service: Cardiovascular;  Laterality: N/A;  . LEFT HEART CATHETERIZATION WITH CORONARY ANGIOGRAM N/A 02/15/2013   Procedure: LEFT HEART CATHETERIZATION WITH CORONARY ANGIOGRAM;  Surgeon: Sherren Mocha, MD;  Location: [facility] CATH LAB;  Service: Cardiovascular;  Laterality: N/A;  . LITHOTRIPSY    . PERCUTANEOUS CORONARY STENT INTERVENTION (PCI-S) N/A 04/24/2012   Procedure: PERCUTANEOUS CORONARY STENT INTERVENTION (PCI-S);  Surgeon: Sherren Mocha, MD;  Location: [facility] CATH LAB;  Service: Cardiovascular;  Laterality: N/A;  . PERCUTANEOUS CORONARY STENT INTERVENTION (PCI-S)  02/15/2013   Procedure: PERCUTANEOUS CORONARY STENT INTERVENTION (PCI-S);  Surgeon: Sherren Mocha, MD;  Location: [facility] CATH LAB;  Service: Cardiovascular;;    Home Medications:  Allergies as of 03/10/2020   No Known Allergies     Medication List       Accurate as of March 10, 2020  3:09 PM. If you have any questions, ask your nurse or doctor.        acetaminophen 500 MG tablet Commonly known as: TYLENOL Take 1,000 mg by mouth every 6 (six) hours as needed for mild pain, moderate pain or headache.   amLODipine 5 MG tablet Commonly known as: NORVASC TAKE 1 TABLET DAILY   aspirin EC 81 MG tablet Take 81 mg by mouth daily.   atorvastatin 40 MG tablet Commonly known as: LIPITOR Take 40 mg by mouth at bedtime.   cefdinir 300 MG capsule Commonly known as: OMNICEF Take 1 capsule (300 mg total) by mouth every 12 (twelve) hours.   clopidogrel 75 MG tablet Commonly known as: PLAVIX TAKE 1 TABLET (75 MG TOTAL) DAILY WITH BREAKFAST. What changed: See the  new instructions.   cyclobenzaprine 10 MG tablet Commonly known as: FLEXERIL Take 10 mg by mouth 3 (three) times daily as needed for muscle spasms.   isosorbide mononitrate 120 MG 24 hr tablet Commonly known as: Imdur Take 1 tablet (120 mg total) by mouth daily.   lisinopril 20 MG tablet Commonly known as: ZESTRIL Take 20 mg daily by mouth.   metFORMIN 500 MG tablet Commonly known as: GLUCOPHAGE Take 500 mg by mouth 2 (two) times daily.   metoprolol tartrate 25 MG tablet Commonly known as: LOPRESSOR TAKE 1 TABLET TWICE A DAY   nitroGLYCERIN 0.4 MG SL  tablet Commonly known as: NITROSTAT Place 1 tablet (0.4 mg total) under the tongue every 5 (five) minutes x 3 doses as needed for chest pain.   pantoprazole 40 MG tablet Commonly known as: PROTONIX Take 1 tablet by mouth once daily   tolterodine 2 MG 24 hr capsule Commonly known as: DETROL LA Take 2 mg by mouth daily.       Allergies: No Known Allergies  Family History: History reviewed. No pertinent family history.  Social History:  reports that he has never smoked. He has never used smokeless tobacco. He reports that he does not drink alcohol or use drugs.  ROS: All other review of systems were reviewed and are negative except what is noted above in HPI  Physical Exam: BP (!) 132/93   Pulse 85   Temp 98.2 F (36.8 C)   Ht 5\' 9"  (1.753 m)   Wt 254 lb (115.2 kg)   BMI 37.51 kg/m   Constitutional:  Alert and oriented, No acute distress. HEENT: [place] AT, moist mucus membranes.  Trachea midline, no masses. Cardiovascular: No clubbing, cyanosis, or edema. Respiratory: Normal respiratory effort, no increased work of breathing. GI: Abdomen is soft, nontender, nondistended, no abdominal masses GU: No CVA tenderness. Circumcised phallus. No masses/lesions on penis, testis, scrotum. Prostate 40g smooth no nodules no induration.  Lymph: No cervical or inguinal lymphadenopathy. Skin: No rashes, bruises or suspicious lesions. Neurologic: Grossly intact, no focal deficits, moving all 4 extremities. Psychiatric: Normal mood and affect.  Laboratory Data: Lab Results  Component Value Date   WBC 7.0 02/03/2020   HGB 13.1 02/03/2020   HCT 41.2 02/03/2020   MCV 91.8 02/03/2020   PLT 176 02/03/2020    Lab Results  Component Value Date   CREATININE 0.71 02/03/2020    No results found for: PSA  No results found for: TESTOSTERONE  Lab Results  Component Value Date   HGBA1C 6.3 (H) 02/01/2020    Urinalysis    Component Value Date/Time   COLORURINE YELLOW 02/01/2020 0611     APPEARANCEUR CLOUDY (A) 02/01/2020 0611   LABSPEC 1.011 02/01/2020 0611   PHURINE 5.0 02/01/2020 0611   GLUCOSEU NEGATIVE 02/01/2020 0611   HGBUR LARGE (A) 02/01/2020 0611   BILIRUBINUR NEGATIVE 02/01/2020 0611   KETONESUR NEGATIVE 02/01/2020 0611   PROTEINUR NEGATIVE 02/01/2020 0611   UROBILINOGEN 2.0 (H) 04/22/2012 1905   NITRITE NEGATIVE 02/01/2020 0611   LEUKOCYTESUR TRACE (A) 02/01/2020 0611    Lab Results  Component Value Date   BACTERIA RARE (A) 02/01/2020    Pertinent Imaging: CT abd/pelvis 3/12: images reviewed and discussed with patient Results for orders placed during the hospital encounter of 06/29/07  DG Abd 1 View   Narrative History: Left renal calculus status post lithotripsy   ABDOMEN ONE VIEW:   Comparison 06/21/2007   Previously identified left renal calculus no longer seen. No  definite stone debris visualized. Small metallic foreign body projects over left sacrum. Stable pelvic phleboliths. Bowel gas pattern normal. Spur formation lumbar spine.   IMPRESSION: Previously identified calculus left kidney no longer seen status post lithotripsy.  Provider: Beattie Sink   No results found for this or any previous visit. No results found for this or any previous visit. No results found for this or any previous visit. No results found for this or any previous visit. No results found for this or any previous visit. No results found for this or any previous visit. No results found for this or any previous visit.  Assessment & Plan:    1. Infection of kidney -likely related to UPJ calculus. Patient treated and we will schedule for left ureteroscopic stone extraction - BLADDER SCAN AMB NON-IMAGING  2. Nephrolithiasis -We discussed the management including ureteroscopic stone extraction and the patient wishes to proceed. Risks/benefits/alterantives.    No follow-ups on file.  Nicolette Bang, MD  [facility] [place]

## 2020-03-10 NOTE — Progress Notes (Signed)
Urological Symptom Review  Patient is experiencing the following symptoms:Kidney stone Kidney stone  Review of Systems  Gastrointestinal (upper)  : Negative for upper GI symptoms  Gastrointestinal (lower) : Negative for lower GI symptoms  Constitutional : Negative for symptoms  Skin: Negative for skin symptoms  Eyes: Negative for eye symptoms  Ear/Nose/Throat : Negative for Ear/Nose/Throat symptoms  Hematologic/Lymphatic: Negative for Hematologic/Lymphatic symptoms  Cardiovascular : Negative for cardiovascular symptoms  Respiratory : Negative for respiratory symptoms  Endocrine: Negative for endocrine symptoms  Musculoskeletal: Back pain  Neurological: Negative for neurological symptoms  Psychologic: Negative for psychiatric symptoms

## 2020-03-10 NOTE — H&P (View-Only) (Signed)
03/10/2020 3:09 PM   [name] 03/11/1951 [id number]  Referring provider: Curlene Labrum, MD [place],  [place] [address]  Back pain  HPI: [name] is [phone] here for evaluation of back pain and nephrolithiasis. He developed severe back pain in 01/2020 and presented to the ER and was diagnosed with a 45mm left UPJ calculus. He has intermittent severe sharp nonraditing mid back pain. No hematuria. He has occasional urge incontinence.    PMH: Past Medical History:  Diagnosis Date  . Arthritis   . CAD (coronary artery disease)    a. 03/2012 Cath: LAD 80, RCA 80, nl EF;  b. PCI of RCA w 3.0x82mm Promus DES, FFR of LAD was nl @ 0.85 ->Med Rx.;  c. LHC 02/15/13: proximal LAD 40-50%, mid LAD 95%, small D1 95-99%, mid CFX 30-40%, proximal RCA stent patent with ostial 20-30% prior to the stent, ostial PDA 50%, EF 55-65%. PCI:  Promus DES to the mid LAD and POBA to the proximal D1   . Hyperlipidemia   . Hypertension   . Obesity     Surgical History: Past Surgical History:  Procedure Laterality Date  . CORONARY ANGIOPLASTY WITH STENT PLACEMENT  04/24/12   DES-RCA  . CORONARY ANGIOPLASTY WITH STENT PLACEMENT  02/15/13   Patent RCA stent, diffuse nonobstructive R PDA, LCx disease, 99% mid LAD stenosis s/p DES-mid LAD; LVEF 60%  . CORONARY STENT PLACEMENT    . HERNIA REPAIR Left inguinal  . LEFT HEART CATH AND CORONARY ANGIOGRAPHY N/A 01/31/2019   Procedure: LEFT HEART CATH AND CORONARY ANGIOGRAPHY;  Surgeon: Sherren Mocha, MD;  Location: [facility];  Service: Cardiovascular;  Laterality: N/A;  . LEFT HEART CATHETERIZATION WITH CORONARY ANGIOGRAM N/A 04/21/2012   Procedure: LEFT HEART CATHETERIZATION WITH CORONARY ANGIOGRAM;  Surgeon: Hillary Bow, MD;  Location: [facility] CATH LAB;  Service: Cardiovascular;  Laterality: N/A;  . LEFT HEART CATHETERIZATION WITH CORONARY ANGIOGRAM N/A 02/15/2013   Procedure: LEFT HEART CATHETERIZATION WITH CORONARY ANGIOGRAM;  Surgeon: Sherren Mocha, MD;  Location: [facility] CATH LAB;  Service: Cardiovascular;  Laterality: N/A;  . LITHOTRIPSY    . PERCUTANEOUS CORONARY STENT INTERVENTION (PCI-S) N/A 04/24/2012   Procedure: PERCUTANEOUS CORONARY STENT INTERVENTION (PCI-S);  Surgeon: Sherren Mocha, MD;  Location: [facility] CATH LAB;  Service: Cardiovascular;  Laterality: N/A;  . PERCUTANEOUS CORONARY STENT INTERVENTION (PCI-S)  02/15/2013   Procedure: PERCUTANEOUS CORONARY STENT INTERVENTION (PCI-S);  Surgeon: Sherren Mocha, MD;  Location: [facility] CATH LAB;  Service: Cardiovascular;;    Home Medications:  Allergies as of 03/10/2020   No Known Allergies     Medication List       Accurate as of March 10, 2020  3:09 PM. If you have any questions, ask your nurse or doctor.        acetaminophen 500 MG tablet Commonly known as: TYLENOL Take 1,000 mg by mouth every 6 (six) hours as needed for mild pain, moderate pain or headache.   amLODipine 5 MG tablet Commonly known as: NORVASC TAKE 1 TABLET DAILY   aspirin EC 81 MG tablet Take 81 mg by mouth daily.   atorvastatin 40 MG tablet Commonly known as: LIPITOR Take 40 mg by mouth at bedtime.   cefdinir 300 MG capsule Commonly known as: OMNICEF Take 1 capsule (300 mg total) by mouth every 12 (twelve) hours.   clopidogrel 75 MG tablet Commonly known as: PLAVIX TAKE 1 TABLET (75 MG TOTAL) DAILY WITH BREAKFAST. What changed: See the  new instructions.   cyclobenzaprine 10 MG tablet Commonly known as: FLEXERIL Take 10 mg by mouth 3 (three) times daily as needed for muscle spasms.   isosorbide mononitrate 120 MG 24 hr tablet Commonly known as: Imdur Take 1 tablet (120 mg total) by mouth daily.   lisinopril 20 MG tablet Commonly known as: ZESTRIL Take 20 mg daily by mouth.   metFORMIN 500 MG tablet Commonly known as: GLUCOPHAGE Take 500 mg by mouth 2 (two) times daily.   metoprolol tartrate 25 MG tablet Commonly known as: LOPRESSOR TAKE 1 TABLET TWICE A DAY   nitroGLYCERIN 0.4 MG SL  tablet Commonly known as: NITROSTAT Place 1 tablet (0.4 mg total) under the tongue every 5 (five) minutes x 3 doses as needed for chest pain.   pantoprazole 40 MG tablet Commonly known as: PROTONIX Take 1 tablet by mouth once daily   tolterodine 2 MG 24 hr capsule Commonly known as: DETROL LA Take 2 mg by mouth daily.       Allergies: No Known Allergies  Family History: History reviewed. No pertinent family history.  Social History:  reports that he has never smoked. He has never used smokeless tobacco. He reports that he does not drink alcohol or use drugs.  ROS: All other review of systems were reviewed and are negative except what is noted above in HPI  Physical Exam: BP (!) 132/93   Pulse 85   Temp 98.2 F (36.8 C)   Ht 5\' 9"  (1.753 m)   Wt 254 lb (115.2 kg)   BMI 37.51 kg/m   Constitutional:  Alert and oriented, No acute distress. HEENT: [place] AT, moist mucus membranes.  Trachea midline, no masses. Cardiovascular: No clubbing, cyanosis, or edema. Respiratory: Normal respiratory effort, no increased work of breathing. GI: Abdomen is soft, nontender, nondistended, no abdominal masses GU: No CVA tenderness. Circumcised phallus. No masses/lesions on penis, testis, scrotum. Prostate 40g smooth no nodules no induration.  Lymph: No cervical or inguinal lymphadenopathy. Skin: No rashes, bruises or suspicious lesions. Neurologic: Grossly intact, no focal deficits, moving all 4 extremities. Psychiatric: Normal mood and affect.  Laboratory Data: Lab Results  Component Value Date   WBC 7.0 02/03/2020   HGB 13.1 02/03/2020   HCT 41.2 02/03/2020   MCV 91.8 02/03/2020   PLT 176 02/03/2020    Lab Results  Component Value Date   CREATININE 0.71 02/03/2020    No results found for: PSA  No results found for: TESTOSTERONE  Lab Results  Component Value Date   HGBA1C 6.3 (H) 02/01/2020    Urinalysis    Component Value Date/Time   COLORURINE YELLOW 02/01/2020 0611     APPEARANCEUR CLOUDY (A) 02/01/2020 0611   LABSPEC 1.011 02/01/2020 0611   PHURINE 5.0 02/01/2020 0611   GLUCOSEU NEGATIVE 02/01/2020 0611   HGBUR LARGE (A) 02/01/2020 0611   BILIRUBINUR NEGATIVE 02/01/2020 0611   KETONESUR NEGATIVE 02/01/2020 0611   PROTEINUR NEGATIVE 02/01/2020 0611   UROBILINOGEN 2.0 (H) 04/22/2012 1905   NITRITE NEGATIVE 02/01/2020 0611   LEUKOCYTESUR TRACE (A) 02/01/2020 0611    Lab Results  Component Value Date   BACTERIA RARE (A) 02/01/2020    Pertinent Imaging: CT abd/pelvis 3/12: images reviewed and discussed with patient Results for orders placed during the hospital encounter of 06/29/07  DG Abd 1 View   Narrative History: Left renal calculus status post lithotripsy   ABDOMEN ONE VIEW:   Comparison 06/21/2007   Previously identified left renal calculus no longer seen. No  definite stone debris visualized. Small metallic foreign body projects over left sacrum. Stable pelvic phleboliths. Bowel gas pattern normal. Spur formation lumbar spine.   IMPRESSION: Previously identified calculus left kidney no longer seen status post lithotripsy.  Provider: [place] Sink   No results found for this or any previous visit. No results found for this or any previous visit. No results found for this or any previous visit. No results found for this or any previous visit. No results found for this or any previous visit. No results found for this or any previous visit. No results found for this or any previous visit.  Assessment & Plan:    1. Infection of kidney -likely related to UPJ calculus. Patient treated and we will schedule for left ureteroscopic stone extraction - BLADDER SCAN AMB NON-IMAGING  2. Nephrolithiasis -We discussed the management including ureteroscopic stone extraction and the patient wishes to proceed. Risks/benefits/alterantives.    No follow-ups on file.  Nicolette Bang, MD  [facility] [place]

## 2020-03-13 ENCOUNTER — Telehealth: Payer: Self-pay

## 2020-03-13 ENCOUNTER — Telehealth: Payer: Self-pay | Admitting: Internal Medicine

## 2020-03-13 DIAGNOSIS — Z23 Encounter for immunization: Secondary | ICD-10-CM | POA: Diagnosis not present

## 2020-03-13 NOTE — Telephone Encounter (Signed)
Dr. Pleas Koch calling to speak with DOD. Transferred call to the [facility].

## 2020-03-13 NOTE — Telephone Encounter (Signed)
error 

## 2020-03-22 DIAGNOSIS — C679 Malignant neoplasm of bladder, unspecified: Secondary | ICD-10-CM

## 2020-03-22 HISTORY — DX: Malignant neoplasm of bladder, unspecified: C67.9

## 2020-03-24 ENCOUNTER — Emergency Department (HOSPITAL_COMMUNITY)
Admission: EM | Admit: 2020-03-24 | Discharge: 2020-03-25 | Disposition: A | Discharge status: Home / Self Care | Payer: PPO | Attending: Emergency Medicine | Admitting: Emergency Medicine

## 2020-03-24 ENCOUNTER — Other Ambulatory Visit: Payer: Self-pay

## 2020-03-24 ENCOUNTER — Encounter (HOSPITAL_COMMUNITY): Payer: Self-pay | Admitting: Emergency Medicine

## 2020-03-24 DIAGNOSIS — Z7982 Long term (current) use of aspirin: Secondary | ICD-10-CM | POA: Insufficient documentation

## 2020-03-24 DIAGNOSIS — I10 Essential (primary) hypertension: Secondary | ICD-10-CM | POA: Insufficient documentation

## 2020-03-24 DIAGNOSIS — R1084 Generalized abdominal pain: Secondary | ICD-10-CM | POA: Diagnosis not present

## 2020-03-24 DIAGNOSIS — Z79899 Other long term (current) drug therapy: Secondary | ICD-10-CM | POA: Insufficient documentation

## 2020-03-24 DIAGNOSIS — I2510 Atherosclerotic heart disease of native coronary artery without angina pectoris: Secondary | ICD-10-CM | POA: Diagnosis not present

## 2020-03-24 DIAGNOSIS — R112 Nausea with vomiting, unspecified: Secondary | ICD-10-CM | POA: Diagnosis not present

## 2020-03-24 LAB — URINALYSIS, ROUTINE W REFLEX MICROSCOPIC
Bacteria, UA: NONE SEEN
Bilirubin Urine: NEGATIVE
Glucose, UA: 50 mg/dL — AB
Ketones, ur: 5 mg/dL — AB
Leukocytes,Ua: NEGATIVE
Nitrite: NEGATIVE
Protein, ur: 30 mg/dL — AB
Specific Gravity, Urine: 1.02 (ref 1.005–1.030)
pH: 6 (ref 5.0–8.0)

## 2020-03-24 LAB — CBC WITH DIFFERENTIAL/PLATELET
Abs Immature Granulocytes: 0.01 10*3/uL (ref 0.00–0.07)
Basophils Absolute: 0 10*3/uL (ref 0.0–0.1)
Basophils Relative: 0 %
Eosinophils Absolute: 0.1 10*3/uL (ref 0.0–0.5)
Eosinophils Relative: 1 %
HCT: 45 % (ref 39.0–52.0)
Hemoglobin: 14.7 g/dL (ref 13.0–17.0)
Immature Granulocytes: 0 %
Lymphocytes Relative: 11 %
Lymphs Abs: 1.2 10*3/uL (ref 0.7–4.0)
MCH: 29.2 pg (ref 26.0–34.0)
MCHC: 32.7 g/dL (ref 30.0–36.0)
MCV: 89.3 fL (ref 80.0–100.0)
Monocytes Absolute: 0.9 10*3/uL (ref 0.1–1.0)
Monocytes Relative: 9 %
Neutro Abs: 8.3 10*3/uL — ABNORMAL HIGH (ref 1.7–7.7)
Neutrophils Relative %: 79 %
Platelets: 263 10*3/uL (ref 150–400)
RBC: 5.04 MIL/uL (ref 4.22–5.81)
RDW: 13.2 % (ref 11.5–15.5)
WBC: 10.5 10*3/uL (ref 4.0–10.5)
nRBC: 0 % (ref 0.0–0.2)

## 2020-03-24 LAB — COMPREHENSIVE METABOLIC PANEL
ALT: 22 U/L (ref 0–44)
AST: 21 U/L (ref 15–41)
Albumin: 4.6 g/dL (ref 3.5–5.0)
Alkaline Phosphatase: 93 U/L (ref 38–126)
Anion gap: 12 (ref 5–15)
BUN: 8 mg/dL (ref 8–23)
CO2: 19 mmol/L — ABNORMAL LOW (ref 22–32)
Calcium: 8.7 mg/dL — ABNORMAL LOW (ref 8.9–10.3)
Chloride: 100 mmol/L (ref 98–111)
Creatinine, Ser: 0.64 mg/dL (ref 0.61–1.24)
GFR calc Af Amer: >60 mL/min (ref >60)
GFR calc non Af Amer: >60 mL/min (ref >60)
Glucose, Bld: 148 mg/dL — ABNORMAL HIGH (ref 70–99)
Potassium: 3.8 mmol/L (ref 3.5–5.1)
Sodium: 131 mmol/L — ABNORMAL LOW (ref 135–145)
Total Bilirubin: 0.9 mg/dL (ref 0.3–1.2)
Total Protein: 7.8 g/dL (ref 6.5–8.1)

## 2020-03-24 LAB — LIPASE, BLOOD: Lipase: 21 U/L (ref 11–51)

## 2020-03-24 MED ORDER — MORPHINE SULFATE (PF) 4 MG/ML IV SOLN
4.0000 mg | Freq: Once | INTRAVENOUS | Status: AC
  Administered 2020-03-24: 21:00:00 4 mg via INTRAVENOUS
  Filled 2020-03-24: qty 1

## 2020-03-24 MED ORDER — ONDANSETRON HCL 4 MG/2ML IJ SOLN
4.0000 mg | Freq: Once | INTRAMUSCULAR | Status: AC
  Administered 2020-03-24: 22:00:00 4 mg via INTRAVENOUS
  Filled 2020-03-24: qty 2

## 2020-03-24 MED ORDER — ONDANSETRON HCL 4 MG/2ML IJ SOLN
4.0000 mg | Freq: Once | INTRAMUSCULAR | Status: AC
  Administered 2020-03-24: 4 mg via INTRAVENOUS
  Filled 2020-03-24: qty 2

## 2020-03-24 MED ORDER — FENTANYL CITRATE (PF) 100 MCG/2ML IJ SOLN
100.0000 ug | Freq: Once | INTRAMUSCULAR | Status: AC
  Administered 2020-03-24: 100 ug via INTRAVENOUS
  Filled 2020-03-24: qty 2

## 2020-03-24 MED ORDER — SODIUM CHLORIDE 0.9 % IV BOLUS
1000.0000 mL | Freq: Once | INTRAVENOUS | Status: AC
  Administered 2020-03-24: 1000 mL via INTRAVENOUS

## 2020-03-24 NOTE — ED Triage Notes (Signed)
Pt c/o epigastric pain and vomiting since yesterday. Pt states he ate bacon yesterday that had mold on it.

## 2020-03-24 NOTE — ED Notes (Signed)
Gave pt's pocket knife to wife to lock in their car.

## 2020-03-24 NOTE — ED Provider Notes (Signed)
[facility] EMERGENCY DEPARTMENT Provider Note   CSN: [id number] Arrival date & time: 03/24/20  2019     History Chief Complaint  Patient presents with  . Abdominal Pain    JABIN KALICH is a 69 y.o. male past with history of CAD, hyperlipidemia, hypertension who presents for evaluation of abdominal pain, nausea/vomiting that began yesterday.  He reports that yesterday morning, he ate some bacon and some mold on it.  He states that he threw it out before anybody else in the house could eat it.  He reports that about a few hours later, he started having generalized abdominal pain and then started having vomiting that began today.  Reports that he has had about 5 episodes of vomiting.  Emesis is nonbloody, nonbilious.  He reports the pain has been constant in his epigastric region.  He has not had any diarrhea.  He has not noted any fevers.  Patient states that he has not been able to tolerate much p.o. today secondary to symptoms.  He denies any chest pain, difficulty breathing, dysuria, hematuria.  The history is provided by the patient.       Past Medical History:  Diagnosis Date  . Arthritis   . CAD (coronary artery disease)    a. 03/2012 Cath: LAD 80, RCA 80, nl EF;  b. PCI of RCA w 3.0x56mm Promus DES, FFR of LAD was nl @ 0.85 ->Med Rx.;  c. LHC 02/15/13: proximal LAD 40-50%, mid LAD 95%, small D1 95-99%, mid CFX 30-40%, proximal RCA stent patent with ostial 20-30% prior to the stent, ostial PDA 50%, EF 55-65%. PCI:  Promus DES to the mid LAD and POBA to the proximal D1   . Hyperlipidemia   . Hypertension   . Obesity     Patient Active Problem List   Diagnosis Date Noted  . Nephrolithiasis 03/10/2020  . Sepsis due to undetermined organism ([place]) 02/01/2020  . Pyelonephritis   . Obesity 05/16/2012  . CAD (coronary artery disease) 04/25/2012  . Hyperlipidemia 04/25/2012  . HTN (hypertension) 04/21/2012    Past Surgical History:  Procedure Laterality Date  . CORONARY  ANGIOPLASTY WITH STENT PLACEMENT  04/24/12   DES-RCA  . CORONARY ANGIOPLASTY WITH STENT PLACEMENT  02/15/13   Patent RCA stent, diffuse nonobstructive R PDA, LCx disease, 99% mid LAD stenosis s/p DES-mid LAD; LVEF 60%  . CORONARY STENT PLACEMENT    . HERNIA REPAIR Left inguinal  . LEFT HEART CATH AND CORONARY ANGIOGRAPHY N/A 01/31/2019   Procedure: LEFT HEART CATH AND CORONARY ANGIOGRAPHY;  Surgeon: Sherren Mocha, MD;  Location: [facility];  Service: Cardiovascular;  Laterality: N/A;  . LEFT HEART CATHETERIZATION WITH CORONARY ANGIOGRAM N/A 04/21/2012   Procedure: LEFT HEART CATHETERIZATION WITH CORONARY ANGIOGRAM;  Surgeon: Hillary Bow, MD;  Location: [facility] CATH LAB;  Service: Cardiovascular;  Laterality: N/A;  . LEFT HEART CATHETERIZATION WITH CORONARY ANGIOGRAM N/A 02/15/2013   Procedure: LEFT HEART CATHETERIZATION WITH CORONARY ANGIOGRAM;  Surgeon: Sherren Mocha, MD;  Location: [facility] CATH LAB;  Service: Cardiovascular;  Laterality: N/A;  . LITHOTRIPSY    . PERCUTANEOUS CORONARY STENT INTERVENTION (PCI-S) N/A 04/24/2012   Procedure: PERCUTANEOUS CORONARY STENT INTERVENTION (PCI-S);  Surgeon: Sherren Mocha, MD;  Location: [facility] CATH LAB;  Service: Cardiovascular;  Laterality: N/A;  . PERCUTANEOUS CORONARY STENT INTERVENTION (PCI-S)  02/15/2013   Procedure: PERCUTANEOUS CORONARY STENT INTERVENTION (PCI-S);  Surgeon: Sherren Mocha, MD;  Location: [facility] CATH LAB;  Service: Cardiovascular;;       No  family history on file.  Social History   Tobacco Use  . Smoking status: Never Smoker  . Smokeless tobacco: Never Used  Substance Use Topics  . Alcohol use: No    Comment: " quit along time ago"  . Drug use: No    Home Medications Prior to Admission medications   Medication Sig Start Date End Date Taking? Authorizing Provider  acetaminophen (TYLENOL) 500 MG tablet Take 1,000 mg by mouth every 6 (six) hours as needed for mild pain, moderate pain or headache.    Yes [provider]    amLODipine (NORVASC) 5 MG tablet TAKE 1 TABLET DAILY Patient taking differently: Take 5 mg by mouth daily.  12/25/13  Yes Sherren Mocha, MD  aspirin EC 81 MG tablet Take 81 mg by mouth daily.   Yes [provider]  atorvastatin (LIPITOR) 40 MG tablet Take 40 mg by mouth at bedtime.    Yes [provider]  clopidogrel (PLAVIX) 75 MG tablet TAKE 1 TABLET (75 MG TOTAL) DAILY WITH BREAKFAST. Patient taking differently: Take 75 mg by mouth daily.  12/25/13  Yes Sherren Mocha, MD  cyclobenzaprine (FLEXERIL) 10 MG tablet Take 10 mg by mouth 3 (three) times daily as needed for muscle spasms.  10/11/18  Yes [provider]  isosorbide mononitrate (IMDUR) 120 MG 24 hr tablet Take 1 tablet (120 mg total) by mouth daily. 08/24/17  Yes Weaver, Scott T, PA-C  lisinopril (PRINIVIL,ZESTRIL) 20 MG tablet Take 20 mg daily by mouth.   Yes [provider]  metFORMIN (GLUCOPHAGE) 500 MG tablet Take 500 mg by mouth 2 (two) times daily. 01/22/20  Yes [provider]  metoprolol tartrate (LOPRESSOR) 25 MG tablet TAKE 1 TABLET TWICE A DAY Patient taking differently: Take 25 mg by mouth in the morning and at bedtime.    Yes Sherren Mocha, MD  nitroGLYCERIN (NITROSTAT) 0.4 MG SL tablet Place 1 tablet (0.4 mg total) under the tongue every 5 (five) minutes x 3 doses as needed for chest pain. 04/25/12  Yes Theora Gianotti, NP  oxyCODONE-acetaminophen (PERCOCET) 5-325 MG tablet Take 1 tablet by mouth every 4 (four) hours as needed for moderate pain or severe pain. 03/10/20  Yes McKenzie, Candee Furbish, MD  pantoprazole (PROTONIX) 40 MG tablet Take 1 tablet by mouth once daily Patient taking differently: Take 40 mg by mouth daily.  11/26/19  Yes Sherren Mocha, MD  tamsulosin (FLOMAX) 0.4 MG CAPS capsule Take 1 capsule (0.4 mg total) by mouth daily after supper. Patient taking differently: Take 0.4 mg by mouth daily.  03/10/20  Yes McKenzie, Candee Furbish, MD  tolterodine (DETROL LA) 2  MG 24 hr capsule Take 2 mg by mouth daily. 01/21/20  Yes [provider]  cefdinir (OMNICEF) 300 MG capsule Take 1 capsule (300 mg total) by mouth every 12 (twelve) hours. Patient not taking: Reported on 03/24/2020 02/03/20   Orson Eva, MD  ondansetron (ZOFRAN ODT) 4 MG disintegrating tablet Take 1 tablet (4 mg total) by mouth every 8 (eight) hours as needed for nausea or vomiting. 03/25/20   Volanda Napoleon, PA-C    Allergies    Patient has no known allergies.  Review of Systems   Review of Systems  Constitutional: Positive for appetite change. Negative for fever.  Respiratory: Negative for cough and shortness of breath.   Cardiovascular: Negative for chest pain.  Gastrointestinal: Positive for abdominal pain, nausea and vomiting.  Genitourinary: Negative for dysuria and hematuria.  Neurological: Negative for headaches.  All other systems reviewed and are negative.   Physical Exam Updated Vital Signs BP (!) 142/90   Pulse 70   Temp 97.8 F (36.6 C) (Oral)   Resp 19   Ht 6' (1.829 m)   Wt 115.2 kg   SpO2 98%   BMI 34.45 kg/m   Physical Exam Vitals and nursing note reviewed.  Constitutional:      Appearance: Normal appearance. He is well-developed.  HENT:     Head: Normocephalic and atraumatic.  Eyes:     General: Lids are normal.     Conjunctiva/sclera: Conjunctivae normal.     Pupils: Pupils are equal, round, and reactive to light.  Cardiovascular:     Rate and Rhythm: Normal rate and regular rhythm.     Pulses: Normal pulses.     Heart sounds: Normal heart sounds. No murmur. No friction rub. No gallop.   Pulmonary:     Effort: Pulmonary effort is normal.     Breath sounds: Normal breath sounds.     Comments: Lungs clear to auscultation bilaterally.  Symmetric chest rise.  No wheezing, rales, rhonchi. Abdominal:     Palpations: Abdomen is soft. Abdomen is not rigid.     Tenderness: There is abdominal tenderness in the right upper quadrant, epigastric area  and left upper quadrant. There is no guarding.     Comments: Abdomen is soft, nondistended.  Tenderness noted to the upper abdominal region.  No rigidity, guarding.  No CVA tenderness noted bilaterally. Diastasis recti present.   Musculoskeletal:        General: Normal range of motion.     Cervical back: Full passive range of motion without pain.  Skin:    General: Skin is warm and dry.     Capillary Refill: Capillary refill takes less than 2 seconds.  Neurological:     Mental Status: He is alert and oriented to person, place, and time.  Psychiatric:        Speech: Speech normal.     ED Results / Procedures / Treatments   Labs (all labs ordered are listed, but only abnormal results are displayed) Labs Reviewed  COMPREHENSIVE METABOLIC PANEL - Abnormal; Notable for the following components:      Result Value   Sodium 131 (*)    CO2 19 (*)    Glucose, Bld 148 (*)    Calcium 8.7 (*)    All other components within normal limits  CBC WITH DIFFERENTIAL/PLATELET - Abnormal; Notable for the following components:   Neutro Abs 8.3 (*)    All other components within normal limits  URINALYSIS, ROUTINE W REFLEX MICROSCOPIC - Abnormal; Notable for the following components:   Glucose, UA 50 (*)    Hgb urine dipstick SMALL (*)    Ketones, ur 5 (*)    Protein, ur 30 (*)    All other components within normal limits  LIPASE, BLOOD    EKG EKG Interpretation  Date/Time:  Monday Mar 24 2020 22:23:20 EDT Ventricular Rate:  81 PR Interval:    QRS Duration: 99 QT Interval:  396 QTC Calculation: 460 R Axis:   45 Text Interpretation: Sinus rhythm RSR' in V1 or V2, right VCD or RVH Since last tracing Rate slower Confirmed by Calvert Cantor [phone]) on 03/24/2020 10:29:01 PM   Radiology No results found.  Procedures Procedures (including critical care time)  Medications Ordered in ED Medications  ondansetron (ZOFRAN) injection 4 mg (4 mg Intravenous Given 03/24/20 2100)  morphine 4 MG/ML  injection 4 mg (4 mg Intravenous Given 03/24/20 2100)  fentaNYL (SUBLIMAZE) injection 100 mcg (100 mcg Intravenous Given 03/24/20 2227)  ondansetron (ZOFRAN) injection 4 mg (4 mg Intravenous Given 03/24/20 2227)  sodium chloride 0.9 % bolus 1,000 mL (0 mLs Intravenous Stopped 03/25/20 0032)    ED Course  I have reviewed the triage vital signs and the nursing notes.  Pertinent labs & imaging results that were available during my care of the patient were reviewed by me and considered in my medical decision making (see chart for details).    MDM Rules/Calculators/A&P                      69 year old male who presents for evaluation of abdominal pain, nausea/vomiting that has been ongoing since yesterday.  He reports he ate some bacon with some mold on it.  He reports that nobody else in the house ate the bacon.  No fevers.  No chest pain, difficulty breathing.  On initial ED arrival, he is afebrile, nontoxic-appearing vital signs are stable.  He is slightly hypertensive but I suspect this is likely due to pain.  He did have an episode of vomiting in the ED waiting room.  On exam, he has tenderness palpation noted to the upper abdomen.  Concern for infectious process versus viral GI process.  History/physical exam is not concerning for ACS etiology, appendicitis, diverticulitis.  Plan to check labs, give fluid.  Lipase normal.  CBC shows no leukocytosis or anemia.  CMP shows BUN of 8, creatinine of 0.64. Urine shows small hemoglobin. No evidence of infectious etiology.   Reevaluation.  Patient still having some pain but does report improvement.  No more vomiting.  We will plan additional analgesics and reassess.  Reevaluation after analgesics.  Patient reports improvement in pain.  Repeat abdominal exam shows improved tenderness.  Patient is able to tolerate p.o. and states he feels better.  He is hemodynamically stable.  At this time, I suspect this is most likely foodborne illness as he ate bacon with  mold on it.  At this time, his exam is not concerning for intra-abdominal process and no indication for CT on pelvis. Discussed patient with Dr. Karle Starch. At this time, patient exhibits no emergent life-threatening condition that require further evaluation in ED or admission. Patient had ample opportunity for questions and discussion. All patient's questions were answered with full understanding. Strict return precautions discussed. Patient expresses understanding and agreement to plan.   Portions of this note were generated with [employer]. Dictation errors may occur despite best attempts at proofreading.   Final Clinical Impression(s) / ED Diagnoses Final diagnoses:  Generalized abdominal pain  Non-intractable vomiting with nausea, unspecified vomiting type    Rx / DC Orders ED Discharge Orders         Ordered    ondansetron (ZOFRAN ODT) 4 MG disintegrating tablet  Every 8 hours PRN     03/25/20 0021           Volanda Napoleon, PA-C 03/25/20 0046    Truddie Hidden, MD 03/25/20 1304

## 2020-03-25 MED ORDER — ONDANSETRON 4 MG PO TBDP: 4.0000 mg | ORAL_TABLET | Freq: Three times a day (TID) | ORAL | 0 refills | Status: DC | PRN

## 2020-03-25 NOTE — Discharge Instructions (Signed)
As we discussed, your work-up today was reassuring.  Make sure you are staying hydrated drinking plenty of fluids.  I have provided you with some Zofran for nausea.  Return to the emergency department for further evaluation or for any worsening abdominal pain, persistent vomiting, chest pain, fever or any other worsening or concerning symptoms.

## 2020-03-28 ENCOUNTER — Other Ambulatory Visit: Payer: Self-pay | Admitting: Cardiovascular Disease

## 2020-04-01 NOTE — Patient Instructions (Signed)
Kyle Lambert  04/01/2020     @PREFPERIOPPHARMACY @   Your procedure is scheduled on  04/07/2020 .  Report to [facility] at  [phone]  A.M.  Call this number if you have problems the morning of surgery:  [phone]   Remember:  Do not eat or drink after midnight.                       Take these medicines the morning of surgery with A SIP OF WATER amlodipine, flexeril(if needed), isosorbide, metoprolol, zofran(if needed), oxycodone(if needed), protonix, flomax.    Do not wear jewelry, make-up or nail polish.  Do not wear lotions, powders, or perfumes. Please wear deodorant and brush your teeth.  Do not shave 48 hours prior to surgery.  Men may shave face and neck.  Do not bring valuables to the hospital.  [facility] is not responsible for any belongings or valuables.  Contacts, dentures or bridgework may not be worn into surgery.  Leave your suitcase in the car.  After surgery it may be brought to your room.  For patients admitted to the hospital, discharge time will be determined by your treatment team.  Patients discharged the day of surgery will not be allowed to drive home.   Name and phone number of your driver:   family Special instructions:  DO NOT smoke the morning of your procedure.  Please read over the following fact sheets that you were given. Anesthesia Post-op Instructions and Care and Recovery After Surgery       Ureteral Stent Implantation, Care After This sheet gives you information about how to care for yourself after your procedure. Your health care provider may also give you more specific instructions. If you have problems or questions, contact your health care provider. What can I expect after the procedure? After the procedure, it is common to have:  Nausea.  Mild pain when you urinate. You may feel this pain in your lower back or lower abdomen. The pain should stop within a few minutes after you urinate. This may last for up to 1  week.  A small amount of blood in your urine for several days. Follow these instructions at home: Medicines  Take over-the-counter and prescription medicines only as told by your health care provider.  If you were prescribed an antibiotic medicine, take it as told by your health care provider. Do not stop taking the antibiotic even if you start to feel better.  Do not drive for 24 hours if you were given a sedative during your procedure.  Ask your health care provider if the medicine prescribed to you requires you to avoid driving or using heavy machinery. Activity  Rest as told by your health care provider.  Avoid sitting for a long time without moving. Get up to take short walks every 1-2 hours. This is important to improve blood flow and breathing. Ask for help if you feel weak or unsteady.  Return to your normal activities as told by your health care provider. Ask your health care provider what activities are safe for you. General instructions   Watch for any blood in your urine. Call your health care provider if the amount of blood in your urine increases.  If you have a catheter: ? Follow instructions from your health care provider about taking care of your catheter and collection bag. ? Do not take baths, swim, or use a  hot tub until your health care provider approves. Ask your health care provider if you may take showers. You may only be allowed to take sponge baths.  Drink enough fluid to keep your urine pale yellow.  Do not use any products that contain nicotine or tobacco, such as cigarettes, e-cigarettes, and chewing tobacco. These can delay healing after surgery. If you need help quitting, ask your health care provider.  Keep all follow-up visits as told by your health care provider. This is important. Contact a health care provider if:  You have pain that gets worse or does not get better with medicine, especially pain when you urinate.  You have difficulty  urinating.  You feel nauseous or you vomit repeatedly during a period of more than 2 days after the procedure. Get help right away if:  Your urine is dark red or has blood clots in it.  You are leaking urine (have incontinence).  The end of the stent comes out of your urethra.  You cannot urinate.  You have sudden, sharp, or severe pain in your abdomen or lower back.  You have a fever.  You have swelling or pain in your legs.  You have difficulty breathing. Summary  After the procedure, it is common to have mild pain when you urinate that goes away within a few minutes after you urinate. This may last for up to 1 week.  Watch for any blood in your urine. Call your health care provider if the amount of blood in your urine increases.  Take over-the-counter and prescription medicines only as told by your health care provider.  Drink enough fluid to keep your urine pale yellow. This information is not intended to replace advice given to you by your health care provider. Make sure you discuss any questions you have with your health care provider. Document Revised: 08/15/2018 Document Reviewed: 08/16/2018 Elsevier Patient Education  [place].  Ureteroscopy Ureteroscopy is a procedure to check for and treat problems inside part of the urinary tract. In this procedure, a thin, tube-shaped instrument with a light at the end (ureteroscope) is used to look at the inside of the kidneys and the ureters, which are the tubes that carry urine from the kidneys to the bladder. The ureteroscope is inserted into one or both of the ureters. You may need this procedure if you have frequent urinary tract infections (UTIs), blood in your urine, or a stone in one of your ureters. A ureteroscopy can be done to find the cause of urine blockage in a ureter and to evaluate other abnormalities inside the ureters or kidneys. If stones are found, they can be removed during the procedure. Polyps,  abnormal tissue, and some types of tumors can also be removed or treated. The ureteroscope may also have a tool to remove tissue to be checked for disease under a microscope (biopsy). Tell a health care provider about:  Any allergies you have.  All medicines you are taking, including vitamins, herbs, eye drops, creams, and over-the-counter medicines.  Any problems you or family members have had with anesthetic medicines.  Any blood disorders you have.  Any surgeries you have had.  Any medical conditions you have.  Whether you are pregnant or may be pregnant. What are the risks? Generally, this is a safe procedure. However, problems may occur, including:  Bleeding.  Infection.  Allergic reactions to medicines.  Scarring that narrows the ureter (stricture).  Creating a hole in the ureter (perforation). What happens before  the procedure? Staying hydrated Follow instructions from your health care provider about hydration, which may include:  Up to 2 hours before the procedure - you may continue to drink clear liquids, such as water, clear fruit juice, black coffee, and plain tea. Eating and drinking restrictions Follow instructions from your health care provider about eating and drinking, which may include:  8 hours before the procedure - stop eating heavy meals or foods such as meat, fried foods, or fatty foods.  6 hours before the procedure - stop eating light meals or foods, such as toast or cereal.  6 hours before the procedure - stop drinking milk or drinks that contain milk.  2 hours before the procedure - stop drinking clear liquids. Medicines  Ask your health care provider about: ? Changing or stopping your regular medicines. This is especially important if you are taking diabetes medicines or blood thinners. ? Taking medicines such as aspirin and ibuprofen. These medicines can thin your blood. Do not take these medicines before your procedure if your health care  provider instructs you not to.  You may be given antibiotic medicine to help prevent infection. General instructions  You may have a urine sample taken to check for infection.  Plan to have someone take you home from the hospital or clinic. What happens during the procedure?   To reduce your risk of infection: ? Your health care team will wash or sanitize their hands. ? Your skin will be washed with soap.  An IV tube will be inserted into one of your veins.  You will be given one of the following: ? A medicine to help you relax (sedative). ? A medicine to make you fall asleep (general anesthetic). ? A medicine that is injected into your spine to numb the area below and slightly above the injection site (spinal anesthetic).  To lower your risk of infection, you may be given an antibiotic medicine by an injection or through the IV tube.  The opening from which you urinate (urethra) will be cleaned with a germ-killing solution.  The ureteroscope will be passed through your urethra into your bladder.  A salt-water solution will flow through the ureteroscope to fill your bladder. This will help the health care provider see the openings of your ureters more clearly.  Then, the ureteroscope will be passed into your ureter. ? If a growth is found, a piece of it may be removed so it can be examined under a microscope (biopsy). ? If a stone is found, it may be removed through the ureteroscope, or the stone may be broken up using a laser, shock waves, or electrical energy. ? In some cases, if the ureter is too small, a tube may be inserted that keeps the ureter open (ureteral stent). The stent may be left in place for 1 or 2 weeks to keep the ureter open, and then the ureteroscopy procedure will be performed.  The scope will be removed, and your bladder will be emptied. The procedure may vary among health care providers and hospitals. What happens after the procedure?  Your blood  pressure, heart rate, breathing rate, and blood oxygen level will be monitored until the medicines you were given have worn off.  You may be asked to urinate.  Donot drive for 24 hours if you were given a sedative. This information is not intended to replace advice given to you by your health care provider. Make sure you discuss any questions you have with your health care  provider. Document Revised: 10/21/2017 Document Reviewed: 08/20/2016 Elsevier Patient Education  2020 [place].  Cystoscopy Cystoscopy is a procedure that is used to help diagnose and sometimes treat conditions that affect the lower urinary tract. The lower urinary tract includes the bladder and the urethra. The urethra is the tube that drains urine from the bladder. Cystoscopy is done using a thin, tube-shaped instrument with a light and camera at the end (cystoscope). The cystoscope may be hard or flexible, depending on the goal of the procedure. The cystoscope is inserted through the urethra, into the bladder. Cystoscopy may be recommended if you have:  Urinary tract infections that keep coming back.  Blood in the urine (hematuria).  An inability to control when you urinate (urinary incontinence) or an overactive bladder.  Unusual cells found in a urine sample.  A blockage in the urethra, such as a urinary stone.  Painful urination.  An abnormality in the bladder found during an intravenous pyelogram (IVP) or CT scan. Cystoscopy may also be done to remove a sample of tissue to be examined under a microscope (biopsy). Tell a health care provider about:  Any allergies you have.  All medicines you are taking, including vitamins, herbs, eye drops, creams, and over-the-counter medicines.  Any problems you or family members have had with anesthetic medicines.  Any blood disorders you have.  Any surgeries you have had.  Any medical conditions you have.  Whether you are pregnant or may be pregnant. What  are the risks? Generally, this is a safe procedure. However, problems may occur, including:  Infection.  Bleeding.  Allergic reactions to medicines.  Damage to other structures or organs. What happens before the procedure?  Ask your health care provider about: ? Changing or stopping your regular medicines. This is especially important if you are taking diabetes medicines or blood thinners. ? Taking medicines such as aspirin and ibuprofen. These medicines can thin your blood. Do not take these medicines unless your health care provider tells you to take them. ? Taking over-the-counter medicines, vitamins, herbs, and supplements.  Follow instructions from your health care provider about eating or drinking restrictions.  Ask your health care provider what steps will be taken to help prevent infection. These may include: ? Washing skin with a germ-killing soap. ? Taking antibiotic medicine.  You may have an exam or testing, such as: ? X-rays of the bladder, urethra, or kidneys. ? Urine tests to check for signs of infection.  Plan to have someone take you home from the hospital or clinic. What happens during the procedure?   You will be given one or more of the following: ? A medicine to help you relax (sedative). ? A medicine to numb the area (local anesthetic).  The area around the opening of your urethra will be cleaned.  The cystoscope will be passed through your urethra into your bladder.  Germ-free (sterile) fluid will flow through the cystoscope to fill your bladder. The fluid will stretch your bladder so that your health care provider can clearly examine your bladder walls.  Your doctor will look at the urethra and bladder. Your doctor may take a biopsy or remove stones.  The cystoscope will be removed, and your bladder will be emptied. The procedure may vary among health care providers and hospitals. What can I expect after the procedure? After the procedure, it is  common to have:  Some soreness or pain in your abdomen and urethra.  Urinary symptoms. These include: ? Mild pain  or burning when you urinate. Pain should stop within a few minutes after you urinate. This may last for up to 1 week. ? A small amount of blood in your urine for several days. ? Feeling like you need to urinate but producing only a small amount of urine. Follow these instructions at home: Medicines  Take over-the-counter and prescription medicines only as told by your health care provider.  If you were prescribed an antibiotic medicine, take it as told by your health care provider. Do not stop taking the antibiotic even if you start to feel better. General instructions  Return to your normal activities as told by your health care provider. Ask your health care provider what activities are safe for you.  Do not drive for 24 hours if you were given a sedative during your procedure.  Watch for any blood in your urine. If the amount of blood in your urine increases, call your health care provider.  Follow instructions from your health care provider about eating or drinking restrictions.  If a tissue sample was removed for testing (biopsy) during your procedure, it is up to you to get your test results. Ask your health care provider, or the department that is doing the test, when your results will be ready.  Drink enough fluid to keep your urine pale yellow.  Keep all follow-up visits as told by your health care provider. This is important. Contact a health care provider if you:  Have pain that gets worse or does not get better with medicine, especially pain when you urinate.  Have trouble urinating.  Have more blood in your urine. Get help right away if you:  Have blood clots in your urine.  Have abdominal pain.  Have a fever or chills.  Are unable to urinate. Summary  Cystoscopy is a procedure that is used to help diagnose and sometimes treat conditions that  affect the lower urinary tract.  Cystoscopy is done using a thin, tube-shaped instrument with a light and camera at the end.  After the procedure, it is common to have some soreness or pain in your abdomen and urethra.  Watch for any blood in your urine. If the amount of blood in your urine increases, call your health care provider.  If you were prescribed an antibiotic medicine, take it as told by your health care provider. Do not stop taking the antibiotic even if you start to feel better. This information is not intended to replace advice given to you by your health care provider. Make sure you discuss any questions you have with your health care provider. Document Revised: 10/31/2018 Document Reviewed: 10/31/2018 Elsevier Patient Education  2020 [place] Anesthesia, Adult, Care After This sheet gives you information about how to care for yourself after your procedure. Your health care provider may also give you more specific instructions. If you have problems or questions, contact your health care provider. What can I expect after the procedure? After the procedure, the following side effects are common:  Pain or discomfort at the IV site.  Nausea.  Vomiting.  Sore throat.  Trouble concentrating.  Feeling cold or chills.  Weak or tired.  Sleepiness and fatigue.  Soreness and body aches. These side effects can affect parts of the body that were not involved in surgery. Follow these instructions at home:  For at least 24 hours after the procedure:  Have a responsible adult stay with you. It is important to have someone help care for you  until you are awake and alert.  Rest as needed.  Do not: ? Participate in activities in which you could fall or become injured. ? Drive. ? Use heavy machinery. ? Drink alcohol. ? Take sleeping pills or medicines that cause drowsiness. ? Make important decisions or sign legal documents. ? Take care of children on your  own. Eating and drinking  Follow any instructions from your health care provider about eating or drinking restrictions.  When you feel hungry, start by eating small amounts of foods that are soft and easy to digest (bland), such as toast. Gradually return to your regular diet.  Drink enough fluid to keep your urine pale yellow.  If you vomit, rehydrate by drinking water, juice, or clear broth. General instructions  If you have sleep apnea, surgery and certain medicines can increase your risk for breathing problems. Follow instructions from your health care provider about wearing your sleep device: ? Anytime you are sleeping, including during daytime naps. ? While taking prescription pain medicines, sleeping medicines, or medicines that make you drowsy.  Return to your normal activities as told by your health care provider. Ask your health care provider what activities are safe for you.  Take over-the-counter and prescription medicines only as told by your health care provider.  If you smoke, do not smoke without supervision.  Keep all follow-up visits as told by your health care provider. This is important. Contact a health care provider if:  You have nausea or vomiting that does not get better with medicine.  You cannot eat or drink without vomiting.  You have pain that does not get better with medicine.  You are unable to pass urine.  You develop a skin rash.  You have a fever.  You have redness around your IV site that gets worse. Get help right away if:  You have difficulty breathing.  You have chest pain.  You have blood in your urine or stool, or you vomit blood. Summary  After the procedure, it is common to have a sore throat or nausea. It is also common to feel tired.  Have a responsible adult stay with you for the first 24 hours after general anesthesia. It is important to have someone help care for you until you are awake and alert.  When you feel hungry,  start by eating small amounts of foods that are soft and easy to digest (bland), such as toast. Gradually return to your regular diet.  Drink enough fluid to keep your urine pale yellow.  Return to your normal activities as told by your health care provider. Ask your health care provider what activities are safe for you. This information is not intended to replace advice given to you by your health care provider. Make sure you discuss any questions you have with your health care provider. Document Revised: 11/11/2017 Document Reviewed: 06/24/2017 Elsevier Patient Education  [place].

## 2020-04-03 ENCOUNTER — Encounter (HOSPITAL_COMMUNITY)
Admission: RE | Admit: 2020-04-03 | Discharge: 2020-04-03 | Disposition: A | Discharge status: Home / Self Care | Payer: PPO | Source: Ambulatory Visit | Attending: Urology | Admitting: Urology

## 2020-04-03 ENCOUNTER — Encounter (HOSPITAL_COMMUNITY): Payer: Self-pay

## 2020-04-03 ENCOUNTER — Other Ambulatory Visit: Payer: Self-pay

## 2020-04-03 DIAGNOSIS — Z01812 Encounter for preprocedural laboratory examination: Secondary | ICD-10-CM | POA: Insufficient documentation

## 2020-04-03 LAB — BASIC METABOLIC PANEL
Anion gap: 10 (ref 5–15)
BUN: 14 mg/dL (ref 8–23)
CO2: 22 mmol/L (ref 22–32)
Calcium: 8.7 mg/dL — ABNORMAL LOW (ref 8.9–10.3)
Chloride: 101 mmol/L (ref 98–111)
Creatinine, Ser: 0.73 mg/dL (ref 0.61–1.24)
GFR calc Af Amer: >60 mL/min (ref >60)
GFR calc non Af Amer: >60 mL/min (ref >60)
Glucose, Bld: 170 mg/dL — ABNORMAL HIGH (ref 70–99)
Potassium: 4.2 mmol/L (ref 3.5–5.1)
Sodium: 133 mmol/L — ABNORMAL LOW (ref 135–145)

## 2020-04-03 LAB — HEMOGLOBIN A1C
Hgb A1c MFr Bld: 6.5 % — ABNORMAL HIGH (ref 4.8–5.6)
Mean Plasma Glucose: 139.85 mg/dL

## 2020-04-04 ENCOUNTER — Other Ambulatory Visit (HOSPITAL_COMMUNITY)
Admission: RE | Admit: 2020-04-04 | Discharge: 2020-04-04 | Disposition: A | Discharge status: Home / Self Care | Payer: PPO | Source: Ambulatory Visit | Attending: Urology | Admitting: Urology

## 2020-04-04 DIAGNOSIS — Z01812 Encounter for preprocedural laboratory examination: Secondary | ICD-10-CM | POA: Insufficient documentation

## 2020-04-04 DIAGNOSIS — Z20822 Contact with and (suspected) exposure to covid-19: Secondary | ICD-10-CM | POA: Diagnosis not present

## 2020-04-04 LAB — SARS CORONAVIRUS 2 (TAT 6-24 HRS): SARS Coronavirus 2: NEGATIVE

## 2020-04-06 NOTE — Telephone Encounter (Signed)
Pt with no ischemic symptoms, low risk patient. Spoke with MD.  Madaline Brilliant to proceed.  Thompson Grayer MD, [place]

## 2020-04-07 ENCOUNTER — Ambulatory Visit (HOSPITAL_COMMUNITY): Payer: PPO | Admitting: Anesthesiology

## 2020-04-07 ENCOUNTER — Encounter (HOSPITAL_COMMUNITY)
Admission: RE | Disposition: A | Discharge status: Home / Self Care | Payer: Self-pay | Source: Home / Self Care | Attending: Urology

## 2020-04-07 ENCOUNTER — Encounter (HOSPITAL_COMMUNITY): Payer: Self-pay | Admitting: Urology

## 2020-04-07 ENCOUNTER — Other Ambulatory Visit: Payer: Self-pay

## 2020-04-07 ENCOUNTER — Ambulatory Visit (HOSPITAL_COMMUNITY)
Admission: RE | Admit: 2020-04-07 | Discharge: 2020-04-07 | Disposition: A | Discharge status: Home / Self Care | Payer: PPO | Attending: Urology | Admitting: Urology

## 2020-04-07 ENCOUNTER — Ambulatory Visit (HOSPITAL_COMMUNITY): Payer: PPO

## 2020-04-07 DIAGNOSIS — C672 Malignant neoplasm of lateral wall of bladder: Secondary | ICD-10-CM | POA: Diagnosis not present

## 2020-04-07 DIAGNOSIS — Z955 Presence of coronary angioplasty implant and graft: Secondary | ICD-10-CM | POA: Insufficient documentation

## 2020-04-07 DIAGNOSIS — D090 Carcinoma in situ of bladder: Secondary | ICD-10-CM | POA: Diagnosis not present

## 2020-04-07 DIAGNOSIS — I10 Essential (primary) hypertension: Secondary | ICD-10-CM | POA: Diagnosis not present

## 2020-04-07 DIAGNOSIS — Z7902 Long term (current) use of antithrombotics/antiplatelets: Secondary | ICD-10-CM | POA: Insufficient documentation

## 2020-04-07 DIAGNOSIS — Z6836 Body mass index (BMI) 36.0-36.9, adult: Secondary | ICD-10-CM | POA: Insufficient documentation

## 2020-04-07 DIAGNOSIS — E785 Hyperlipidemia, unspecified: Secondary | ICD-10-CM | POA: Insufficient documentation

## 2020-04-07 DIAGNOSIS — I2510 Atherosclerotic heart disease of native coronary artery without angina pectoris: Secondary | ICD-10-CM | POA: Diagnosis not present

## 2020-04-07 DIAGNOSIS — E669 Obesity, unspecified: Secondary | ICD-10-CM | POA: Insufficient documentation

## 2020-04-07 DIAGNOSIS — M1990 Unspecified osteoarthritis, unspecified site: Secondary | ICD-10-CM | POA: Diagnosis not present

## 2020-04-07 DIAGNOSIS — Z79899 Other long term (current) drug therapy: Secondary | ICD-10-CM | POA: Insufficient documentation

## 2020-04-07 DIAGNOSIS — Z7982 Long term (current) use of aspirin: Secondary | ICD-10-CM | POA: Insufficient documentation

## 2020-04-07 DIAGNOSIS — N200 Calculus of kidney: Secondary | ICD-10-CM | POA: Diagnosis not present

## 2020-04-07 DIAGNOSIS — D494 Neoplasm of unspecified behavior of bladder: Secondary | ICD-10-CM | POA: Diagnosis not present

## 2020-04-07 HISTORY — PX: CYSTOSCOPY W/ RETROGRADES: SHX1426

## 2020-04-07 HISTORY — PX: CYSTOSCOPY WITH BIOPSY: SHX5122

## 2020-04-07 HISTORY — PX: CYSTOSCOPY WITH FULGERATION: SHX6638

## 2020-04-07 HISTORY — PX: CYSTOSCOPY/URETEROSCOPY/HOLMIUM LASER/STENT PLACEMENT: SHX6546

## 2020-04-07 LAB — GLUCOSE, CAPILLARY
Glucose-Capillary: 105 mg/dL — ABNORMAL HIGH (ref 70–99)
Glucose-Capillary: 125 mg/dL — ABNORMAL HIGH (ref 70–99)

## 2020-04-07 SURGERY — CYSTOSCOPY/URETEROSCOPY/HOLMIUM LASER/STENT PLACEMENT
Anesthesia: General | Laterality: Left

## 2020-04-07 MED ORDER — FENTANYL CITRATE (PF) 100 MCG/2ML IJ SOLN
INTRAMUSCULAR | Status: AC
  Filled 2020-04-07: qty 2

## 2020-04-07 MED ORDER — MIDAZOLAM HCL 2 MG/2ML IJ SOLN
INTRAMUSCULAR | Status: AC
  Filled 2020-04-07: qty 2

## 2020-04-07 MED ORDER — LACTATED RINGERS IV SOLN: INTRAVENOUS | Status: DC

## 2020-04-07 MED ORDER — DEXAMETHASONE SODIUM PHOSPHATE 10 MG/ML IJ SOLN
INTRAMUSCULAR | Status: AC
  Filled 2020-04-07: qty 1

## 2020-04-07 MED ORDER — PROPOFOL 10 MG/ML IV BOLUS
INTRAVENOUS | Status: AC
  Filled 2020-04-07: qty 20

## 2020-04-07 MED ORDER — DIATRIZOATE MEGLUMINE 30 % UR SOLN
URETHRAL | Status: AC
  Filled 2020-04-07: qty 100

## 2020-04-07 MED ORDER — OXYCODONE-ACETAMINOPHEN 5-325 MG PO TABS: 1.0000 | ORAL_TABLET | ORAL | 0 refills | Status: DC | PRN

## 2020-04-07 MED ORDER — PROPOFOL 10 MG/ML IV BOLUS
INTRAVENOUS | Status: DC | PRN
  Administered 2020-04-07: 200 mg via INTRAVENOUS

## 2020-04-07 MED ORDER — ONDANSETRON HCL 4 MG/2ML IJ SOLN
INTRAMUSCULAR | Status: AC
  Filled 2020-04-07: qty 2

## 2020-04-07 MED ORDER — CEFAZOLIN SODIUM-DEXTROSE 2-4 GM/100ML-% IV SOLN
INTRAVENOUS | Status: AC
  Filled 2020-04-07: qty 100

## 2020-04-07 MED ORDER — DEXAMETHASONE SODIUM PHOSPHATE 10 MG/ML IJ SOLN
INTRAMUSCULAR | Status: DC | PRN
  Administered 2020-04-07: 4 mg via INTRAVENOUS

## 2020-04-07 MED ORDER — LACTATED RINGERS IV SOLN: INTRAVENOUS | Status: DC | PRN

## 2020-04-07 MED ORDER — CEFAZOLIN SODIUM-DEXTROSE 2-4 GM/100ML-% IV SOLN
2.0000 g | INTRAVENOUS | Status: AC
  Administered 2020-04-07: 2 g via INTRAVENOUS

## 2020-04-07 MED ORDER — STERILE WATER FOR IRRIGATION IR SOLN
Status: DC | PRN
  Administered 2020-04-07: 1000 mL

## 2020-04-07 MED ORDER — DIATRIZOATE MEGLUMINE 30 % UR SOLN
URETHRAL | Status: DC | PRN
  Administered 2020-04-07: 10 mL

## 2020-04-07 MED ORDER — ONDANSETRON HCL 4 MG/2ML IJ SOLN
INTRAMUSCULAR | Status: DC | PRN
  Administered 2020-04-07: 4 mg via INTRAVENOUS

## 2020-04-07 MED ORDER — FENTANYL CITRATE (PF) 100 MCG/2ML IJ SOLN
INTRAMUSCULAR | Status: DC | PRN
  Administered 2020-04-07: 100 ug via INTRAVENOUS
  Administered 2020-04-07 (×2): 50 ug via INTRAVENOUS

## 2020-04-07 MED ORDER — MIDAZOLAM HCL 5 MG/5ML IJ SOLN
INTRAMUSCULAR | Status: DC | PRN
  Administered 2020-04-07: 2 mg via INTRAVENOUS

## 2020-04-07 MED ORDER — FENTANYL CITRATE (PF) 100 MCG/2ML IJ SOLN
25.0000 ug | INTRAMUSCULAR | Status: DC | PRN
  Administered 2020-04-07 (×2): 50 ug via INTRAVENOUS
  Filled 2020-04-07: qty 2

## 2020-04-07 MED ORDER — SODIUM CHLORIDE 0.9 % IR SOLN
Status: DC | PRN
  Administered 2020-04-07 (×2): 3000 mL

## 2020-04-07 SURGICAL SUPPLY — 25 items
BAG DRAIN URO TABLE W/ADPT NS (BAG) ×4 IMPLANT
BAG HAMPER (MISCELLANEOUS) ×4 IMPLANT
CATH INTERMIT  6FR 70CM (CATHETERS) ×4 IMPLANT
CLOTH BEACON ORANGE TIMEOUT ST (SAFETY) ×4 IMPLANT
EXTRACTOR STONE NITINOL NGAGE (UROLOGICAL SUPPLIES) ×4 IMPLANT
FIBER LASER FLEXIVA 200 (UROLOGICAL SUPPLIES) ×4 IMPLANT
GLOVE BIO SURGEON STRL SZ8 (GLOVE) ×4 IMPLANT
GLOVE BIOGEL M 7.0 STRL (GLOVE) ×4 IMPLANT
GLOVE BIOGEL PI IND STRL 7.0 (GLOVE) ×4 IMPLANT
GLOVE BIOGEL PI INDICATOR 7.0 (GLOVE) ×4
GOWN STRL REUS W/TWL LRG LVL3 (GOWN DISPOSABLE) ×4 IMPLANT
GOWN STRL REUS W/TWL XL LVL3 (GOWN DISPOSABLE) ×4 IMPLANT
GUIDEWIRE ANG ZIPWIRE 038X150 (WIRE) ×4 IMPLANT
GUIDEWIRE STR DUAL SENSOR (WIRE) ×4 IMPLANT
IV NS IRRIG 3000ML ARTHROMATIC (IV SOLUTION) ×8 IMPLANT
KIT TURNOVER CYSTO (KITS) ×4 IMPLANT
MANIFOLD NEPTUNE II (INSTRUMENTS) ×4 IMPLANT
PAD ARMBOARD 7.5X6 YLW CONV (MISCELLANEOUS) ×4 IMPLANT
SET IRRIG Y TYPE TUR BLADDER L (SET/KITS/TRAYS/PACK) ×4 IMPLANT
SHEATH URETERAL 12FRX35CM (MISCELLANEOUS) ×4 IMPLANT
STENT URET 6FRX26 CONTOUR (STENTS) ×4 IMPLANT
SYR 10ML LL (SYRINGE) ×4 IMPLANT
TOWEL NATURAL 4PK STERILE (DISPOSABLE) ×4 IMPLANT
TRAY CYSTO PACK (CUSTOM PROCEDURE TRAY) ×4 IMPLANT
WATER STERILE IRR 500ML POUR (IV SOLUTION) ×4 IMPLANT

## 2020-04-07 NOTE — Anesthesia Postprocedure Evaluation (Signed)
Anesthesia Post Note  Patient: Kyle Lambert  Procedure(s) Performed: CYSTOSCOPY/LEFT URETEROSCOPY/HOLMIUM LASER LITHOTRIPSY/LEFT URETERAL STENT PLACEMENT (Left ) CYSTOSCOPY WITH LEFT RETROGRADE PYELOGRAM CYSTOSCOPY WITH BLADDER BIOPSY CYSTOSCOPY WITH Post  Patient location during evaluation: PACU Anesthesia Type: General Level of consciousness: awake, oriented, awake and alert and patient cooperative Pain management: satisfactory to patient Vital Signs Assessment: post-procedure vital signs reviewed and stable Respiratory status: spontaneous breathing, respiratory function stable and nonlabored ventilation Cardiovascular status: stable Postop Assessment: no apparent nausea or vomiting Anesthetic complications: no     Last Vitals:  Vitals:   04/07/20 0837  BP: (!) 149/97  Pulse: 75  Resp: 20  Temp: (!) 36.2 C  SpO2: 98%    Last Pain:  Vitals:   04/07/20 0837  TempSrc: Oral  PainSc: 0-No pain                 Jaspal Pultz

## 2020-04-07 NOTE — Anesthesia Procedure Notes (Signed)
Procedure Name: LMA Insertion Date/Time: 04/07/2020 10:12 AM Performed by: Jonna Munro, CRNA Pre-anesthesia Checklist: Patient identified, Emergency Drugs available, Suction available, Patient being monitored and Timeout performed Patient Re-evaluated:Patient Re-evaluated prior to induction Oxygen Delivery Method: Circle system utilized Preoxygenation: Pre-oxygenation with 100% oxygen Induction Type: IV induction LMA: LMA inserted LMA Size: 5.0 Number of attempts: 1 Placement Confirmation: positive ETCO2 and breath sounds checked- equal and bilateral Tube secured with: Tape

## 2020-04-07 NOTE — Op Note (Signed)
.  Preoperative diagnosis: Left renal stone  Postoperative diagnosis: left renal stone, rigtht lateral wall bladder tumor  Procedure: 1 cystoscopy 2. Left retrograde pyelography 3.  Intraoperative fluoroscopy, under one hour, with interpretation 4.  Left ureteroscopic stone manipulation with laser lithotripsy 5.  Left 6 x 26 JJ stent placement 6. 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 No hydronephrosis. No masses/lesions in the bladder. Ureteral orifices in normal anatomic location.  Indications: Patient is a 69 year old male with a history of left renal stone and who has persistent left flank pain.  After discussing treatment options, he decided proceed with left ureteroscopic stone manipulation.  Procedure her in detail: The patient was brought to the operating room and a brief timeout was done to ensure correct patient, correct procedure, correct site.  General anesthesia was administered patient was placed in dorsal lithotomy position.  Her genitalia was then prepped and draped in usual sterile fashion.  A rigid 98 French cystoscope was passed in the urethra and the bladder.  Bladder was inspected and we noted a papillary 0.5cm right lateral wall tumor.  the ureteral orifices were in the normal orthotopic locations.  a 6 french ureteral catheter was then instilled into the left ureteral orifice.  a gentle retrograde was obtained and findings noted above.  we then placed a zip wire through the ureteral catheter and advanced up to the renal pelvis.  we then removed the cystoscope and cannulated the left ureteral orifice with a semirigid ureteroscope.  No stone was found in the ureter. Once we reached the UPJ a sensor wire was  advanced in to the renal pelvis. We then removed the ureteroscope and advanced am 12/14 x 35cm access sheath up to the renal pelvis. We then used the flexible ureteroscope to perform nephroscopy. We encountered the stone in the lower pole. Using a 200nm laser fiber the stone was fragmented. the fragments were then removed with a Ngage basket.   once all stone fragments were removed we then removed the access sheath under direct vision and noted no injury to the ureter. We then placed a 6 x 26 double-j ureteral stent over the original zip wire.  We then removed the wire and good coil was noted in the the renal pelvis under fluoroscopy and the bladder under direct vision. Using a biopsy forceps the right lateral wall tumor was removed. We then used the bugbee to obtain hemostasis. the bladder was then drained and this concluded the procedure which was well tolerated by patient.  Complications: None  Condition: Stable, extubated, transferred to PACU  Plan: Patient is to be discharged home as to follow-up in one week for stent removal and pathology discussion

## 2020-04-07 NOTE — Anesthesia Preprocedure Evaluation (Addendum)
Anesthesia Evaluation  Patient identified by MRN, date of birth, ID band Patient awake    Reviewed: Allergy & Precautions, H&P , NPO status , Patient's Chart, lab work & pertinent test results, reviewed documented beta blocker date and time   Airway Mallampati: II  TM Distance: >3 FB Neck ROM: full    Dental no notable dental hx. (+) Missing   Pulmonary neg pulmonary ROS,    Pulmonary exam normal breath sounds clear to auscultation       Cardiovascular Exercise Tolerance: Good hypertension, + CAD and + Cardiac Stents   Rhythm:regular Rate:Normal     Neuro/Psych negative neurological ROS  negative psych ROS   GI/Hepatic negative GI ROS, Neg liver ROS,   Endo/Other  negative endocrine ROS  Renal/GU Renal disease  negative genitourinary   Musculoskeletal   Abdominal   Peds  Hematology negative hematology ROS (+)   Anesthesia Other Findings   Reproductive/Obstetrics negative OB ROS                            Anesthesia Physical Anesthesia Plan  ASA: III  Anesthesia Plan: General   Post-op Pain Management:    Induction:   PONV Risk Score and Plan: 2  Airway Management Planned:   Additional Equipment:   Intra-op Plan:   Post-operative Plan:   Informed Consent: I have reviewed the patients History and Physical, chart, labs and discussed the procedure including the risks, benefits and alternatives for the proposed anesthesia with the patient or authorized representative who has indicated his/her understanding and acceptance.     Dental Advisory Given  Plan Discussed with: CRNA  Anesthesia Plan Comments:        Anesthesia Quick Evaluation

## 2020-04-07 NOTE — Transfer of Care (Signed)
Immediate Anesthesia Transfer of Care Note  Patient: Kyle Lambert  Procedure(s) Performed: CYSTOSCOPY/LEFT URETEROSCOPY/HOLMIUM LASER LITHOTRIPSY/LEFT URETERAL STENT PLACEMENT (Left ) CYSTOSCOPY WITH LEFT RETROGRADE PYELOGRAM CYSTOSCOPY WITH BLADDER BIOPSY CYSTOSCOPY WITH FULGERATION  Patient Location: PACU  Anesthesia Type:General  Level of Consciousness: awake, alert , oriented and patient cooperative  Airway & Oxygen Therapy: Patient Spontanous Breathing  Post-op Assessment: Report given to RN, Post -op Vital signs reviewed and stable and Patient moving all extremities X 4  Post vital signs: Reviewed and stable  Last Vitals:  Vitals Value Taken Time  BP    Temp    Pulse    Resp    SpO2      Last Pain:  Vitals:   04/07/20 0837  TempSrc: Oral  PainSc: 0-No pain         Complications: No apparent anesthesia complications

## 2020-04-07 NOTE — Discharge Instructions (Signed)

## 2020-04-07 NOTE — Interval H&P Note (Signed)
History and Physical Interval Note:  04/07/2020 9:38 AM  Kyle Lambert  has presented today for surgery, with the diagnosis of renal calculus.  The various methods of treatment have been discussed with the patient and family. After consideration of risks, benefits and other options for treatment, the patient has consented to  Procedure(s): CYSTOSCOPY/LEFT URETEROSCOPY/HOLMIUM LASER LITHOTRIPSY/LEFT URETERAL STENT PLACEMENT (Left) CYSTOSCOPY WITH LEFT RETROGRADE PYELOGRAM as a surgical intervention.  The patient's history has been reviewed, patient examined, no change in status, stable for surgery.  I have reviewed the patient's chart and labs.  Questions were answered to the patient's satisfaction.     Nicolette Bang

## 2020-04-08 LAB — SURGICAL PATHOLOGY

## 2020-04-10 DIAGNOSIS — Z23 Encounter for immunization: Secondary | ICD-10-CM | POA: Diagnosis not present

## 2020-04-11 LAB — CALCULI, WITH PHOTOGRAPH (CLINICAL LAB)
Calcium Oxalate Dihydrate: 60 %
Calcium Oxalate Monohydrate: 40 %
Weight Calculi: 50 mg

## 2020-04-14 ENCOUNTER — Other Ambulatory Visit: Payer: Self-pay

## 2020-04-14 ENCOUNTER — Ambulatory Visit (INDEPENDENT_AMBULATORY_CARE_PROVIDER_SITE_OTHER): Payer: PPO | Admitting: Urology

## 2020-04-14 ENCOUNTER — Telehealth: Payer: Self-pay | Admitting: Urology

## 2020-04-14 ENCOUNTER — Encounter: Payer: Self-pay | Admitting: Urology

## 2020-04-14 VITALS — BP 92/62 | HR 90 | Temp 97.3°F | Ht 69.0 in | Wt 250.0 lb

## 2020-04-14 DIAGNOSIS — N159 Renal tubulo-interstitial disease, unspecified: Secondary | ICD-10-CM

## 2020-04-14 DIAGNOSIS — N200 Calculus of kidney: Secondary | ICD-10-CM | POA: Diagnosis not present

## 2020-04-14 DIAGNOSIS — C672 Malignant neoplasm of lateral wall of bladder: Secondary | ICD-10-CM | POA: Insufficient documentation

## 2020-04-14 LAB — POCT URINALYSIS DIPSTICK
Bilirubin, UA: NEGATIVE
Glucose, UA: NEGATIVE
Ketones, UA: NEGATIVE
Nitrite, UA: NEGATIVE
Protein, UA: POSITIVE — AB
Spec Grav, UA: >=1.03 — AB (ref 1.010–1.025)
Urobilinogen, UA: 0.2 E.U./dL
pH, UA: 5 (ref 5.0–8.0)

## 2020-04-14 MED ORDER — CIPROFLOXACIN HCL 500 MG PO TABS
500.0000 mg | ORAL_TABLET | Freq: Once | ORAL | Status: AC
  Administered 2020-04-14: 500 mg via ORAL

## 2020-04-14 NOTE — Progress Notes (Signed)
Urological Symptom Review  Patient is experiencing the following symptoms: Burning/pain with urination Get up at night to urinate Trouble starting stream Have to strain to urinate Weak stream Pt took 2nd covid shot 04/10/20 and he developed most symptoms after the shot.  Review of Systems  Gastrointestinal (upper)  : Nausea Vomiting  Gastrointestinal (lower) : Diarrhea  Constitutional : Fever Night Sweats  Skin: Negative for skin symptoms  Eyes: Negative for eye symptoms  Ear/Nose/Throat : Negative for Ear/Nose/Throat symptoms  Hematologic/Lymphatic: Negative for Hematologic/Lymphatic symptoms  Cardiovascular : Chest pain  Respiratory : Shortness of breath  Endocrine: Negative for endocrine symptoms  Musculoskeletal: Back pain Joint pain  Neurological: Negative for neurological symptoms  Psychologic: Negative for psychiatric symptoms

## 2020-04-14 NOTE — Telephone Encounter (Signed)
I spoke with Dr. Alyson Ingles that pt does not need anything else post stent removal. Wife states pt has had periods of nausea since the covid vaccine. Encouraged wife to call PCP in regard to covid vaccine issues.

## 2020-04-14 NOTE — Progress Notes (Signed)
   04/14/20  CC: followup ureteroscopy  HPI: Kyle Lambert is a 69yo here for stent removal. He underwent bladder biopsy at the time of ureteroscopy which revealed TaG1 bladder cancer Blood pressure 92/62, pulse 90, temperature (!) 97.3 F (36.3 C), height 5\' 9"  (1.753 m), weight 250 lb (113.4 kg). NED. A&Ox3.   No respiratory distress   Abd soft, NT, ND Normal phallus with bilateral descended testicles  Cystoscopy Procedure Note  Patient identification was confirmed, informed consent was obtained, and patient was prepped using Betadine solution.  Lidocaine jelly was administered per urethral meatus.     Pre-Procedure: - Inspection reveals a normal caliber ureteral meatus.  Procedure: The flexible cystoscope was introduced without difficulty - No urethral strictures/lesions are present. - Enlarged prostate  - Tight bladder neck - Bilateral ureteral orifices identified - Bladder mucosa  reveals no ulcers, tumors, or lesions - No bladder stones - No trabeculation  Left ureteral stent removed intact   Post-Procedure: - Patient tolerated the procedure well  Assessment/ Plan:   Return in about 3 months (around 07/15/2020) for cystoscopy.  Nicolette Bang, MD

## 2020-04-14 NOTE — Telephone Encounter (Signed)
Pts wife called and stated he got sick after cipro. Asked for nurse to call her back.

## 2020-04-15 NOTE — Patient Instructions (Signed)
Dietary Guidelines to Help Prevent Kidney Stones Kidney stones are deposits of minerals and salts that form inside your kidneys. Your risk of developing kidney stones may be greater depending on your diet, your lifestyle, the medicines you take, and whether you have certain medical conditions. Most people can reduce their chances of developing kidney stones by following the instructions below. Depending on your overall health and the type of kidney stones you tend to develop, your dietitian may give you more specific instructions. What are tips for following this plan? Reading food labels  Choose foods with "no salt added" or "low-salt" labels. Limit your sodium intake to less than 1500 mg per day.  Choose foods with calcium for each meal and snack. Try to eat about 300 mg of calcium at each meal. Foods that contain 200-500 mg of calcium per serving include: ? 8 oz (237 ml) of milk, fortified nondairy milk, and fortified fruit juice. ? 8 oz (237 ml) of kefir, yogurt, and soy yogurt. ? 4 oz (118 ml) of tofu. ? 1 oz of cheese. ? 1 cup (300 g) of dried figs. ? 1 cup (91 g) of cooked broccoli. ? 1-3 oz can of sardines or mackerel.  Most people need 1000 to 1500 mg of calcium each day. Talk to your dietitian about how much calcium is recommended for you. Shopping  Buy plenty of fresh fruits and vegetables. Most people do not need to avoid fruits and vegetables, even if they contain nutrients that may contribute to kidney stones.  When shopping for convenience foods, choose: ? Whole pieces of fruit. ? Premade salads with dressing on the side. ? Low-fat fruit and yogurt smoothies.  Avoid buying frozen meals or prepared deli foods.  Look for foods with live cultures, such as yogurt and kefir. Cooking  Do not add salt to food when cooking. Place a salt shaker on the table and allow each person to add his or her own salt to taste.  Use vegetable protein, such as beans, textured vegetable  protein (TVP), or tofu instead of meat in pasta, casseroles, and soups. Meal planning   Eat less salt, if told by your dietitian. To do this: ? Avoid eating processed or premade food. ? Avoid eating fast food.  Eat less animal protein, including cheese, meat, poultry, or fish, if told by your dietitian. To do this: ? Limit the number of times you have meat, poultry, fish, or cheese each week. Eat a diet free of meat at least 2 days a week. ? Eat only one serving each day of meat, poultry, fish, or seafood. ? When you prepare animal protein, cut pieces into small portion sizes. For most meat and fish, one serving is about the size of one deck of cards.  Eat at least 5 servings of fresh fruits and vegetables each day. To do this: ? Keep fruits and vegetables on hand for snacks. ? Eat 1 piece of fruit or a handful of berries with breakfast. ? Have a salad and fruit at lunch. ? Have two kinds of vegetables at dinner.  Limit foods that are high in a substance called oxalate. These include: ? Spinach. ? Rhubarb. ? Beets. ? Potato chips and french fries. ? Nuts.  If you regularly take a diuretic medicine, make sure to eat at least 1-2 fruits or vegetables high in potassium each day. These include: ? Avocado. ? Banana. ? Orange, prune, carrot, or tomato juice. ? Baked potato. ? Cabbage. ? Beans and split   peas. General instructions   Drink enough fluid to keep your urine clear or pale yellow. This is the most important thing you can do.  Talk to your health care provider and dietitian about taking daily supplements. Depending on your health and the cause of your kidney stones, you may be advised: ? Not to take supplements with vitamin C. ? To take a calcium supplement. ? To take a daily probiotic supplement. ? To take other supplements such as magnesium, fish oil, or vitamin B6.  Take all medicines and supplements as told by your health care provider.  Limit alcohol intake to no  more than 1 drink a day for nonpregnant women and 2 drinks a day for men. One drink equals 12 oz of beer, 5 oz of wine, or 1 oz of hard liquor.  Lose weight if told by your health care provider. Work with your dietitian to find strategies and an eating plan that works best for you. What foods are not recommended? Limit your intake of the following foods, or as told by your dietitian. Talk to your dietitian about specific foods you should avoid based on the type of kidney stones and your overall health. Grains Breads. Bagels. Rolls. Baked goods. Salted crackers. Cereal. Pasta. Vegetables Spinach. Rhubarb. Beets. Canned vegetables. Pickles. Olives. Meats and other protein foods Nuts. Nut butters. Large portions of meat, poultry, or fish. Salted or cured meats. Deli meats. Hot dogs. Sausages. Dairy Cheese. Beverages Regular soft drinks. Regular vegetable juice. Seasonings and other foods Seasoning blends with salt. Salad dressings. Canned soups. Soy sauce. Ketchup. Barbecue sauce. Canned pasta sauce. Casseroles. Pizza. Lasagna. Frozen meals. Potato chips. French fries. Summary  You can reduce your risk of kidney stones by making changes to your diet.  The most important thing you can do is drink enough fluid. You should drink enough fluid to keep your urine clear or pale yellow.  Ask your health care provider or dietitian how much protein from animal sources you should eat each day, and also how much salt and calcium you should have each day. This information is not intended to replace advice given to you by your health care provider. Make sure you discuss any questions you have with your health care provider. Document Revised: 02/28/2019 Document Reviewed: 10/19/2016 [employer] Patient Education  2020 [employer]  

## 2020-04-19 ENCOUNTER — Emergency Department (HOSPITAL_COMMUNITY): Payer: PPO

## 2020-04-19 ENCOUNTER — Other Ambulatory Visit: Payer: Self-pay

## 2020-04-19 ENCOUNTER — Encounter (HOSPITAL_COMMUNITY): Payer: Self-pay | Admitting: Emergency Medicine

## 2020-04-19 ENCOUNTER — Emergency Department (HOSPITAL_COMMUNITY)
Admission: EM | Admit: 2020-04-19 | Discharge: 2020-04-19 | Disposition: A | Discharge status: Home / Self Care | Payer: PPO | Attending: Emergency Medicine | Admitting: Emergency Medicine

## 2020-04-19 DIAGNOSIS — Z7984 Long term (current) use of oral hypoglycemic drugs: Secondary | ICD-10-CM | POA: Insufficient documentation

## 2020-04-19 DIAGNOSIS — K625 Hemorrhage of anus and rectum: Secondary | ICD-10-CM | POA: Insufficient documentation

## 2020-04-19 DIAGNOSIS — M6281 Muscle weakness (generalized): Secondary | ICD-10-CM | POA: Diagnosis not present

## 2020-04-19 DIAGNOSIS — N390 Urinary tract infection, site not specified: Secondary | ICD-10-CM | POA: Insufficient documentation

## 2020-04-19 DIAGNOSIS — R319 Hematuria, unspecified: Secondary | ICD-10-CM | POA: Insufficient documentation

## 2020-04-19 DIAGNOSIS — I10 Essential (primary) hypertension: Secondary | ICD-10-CM | POA: Diagnosis not present

## 2020-04-19 DIAGNOSIS — Z79899 Other long term (current) drug therapy: Secondary | ICD-10-CM | POA: Diagnosis not present

## 2020-04-19 DIAGNOSIS — Z7982 Long term (current) use of aspirin: Secondary | ICD-10-CM | POA: Diagnosis not present

## 2020-04-19 DIAGNOSIS — R4182 Altered mental status, unspecified: Secondary | ICD-10-CM | POA: Diagnosis not present

## 2020-04-19 DIAGNOSIS — R531 Weakness: Secondary | ICD-10-CM | POA: Diagnosis not present

## 2020-04-19 DIAGNOSIS — I2510 Atherosclerotic heart disease of native coronary artery without angina pectoris: Secondary | ICD-10-CM | POA: Insufficient documentation

## 2020-04-19 DIAGNOSIS — C672 Malignant neoplasm of lateral wall of bladder: Secondary | ICD-10-CM | POA: Insufficient documentation

## 2020-04-19 DIAGNOSIS — Z7901 Long term (current) use of anticoagulants: Secondary | ICD-10-CM | POA: Insufficient documentation

## 2020-04-19 DIAGNOSIS — R079 Chest pain, unspecified: Secondary | ICD-10-CM | POA: Diagnosis not present

## 2020-04-19 LAB — CBC WITH DIFFERENTIAL/PLATELET
Abs Immature Granulocytes: 0.12 10*3/uL — ABNORMAL HIGH (ref 0.00–0.07)
Basophils Absolute: 0 10*3/uL (ref 0.0–0.1)
Basophils Relative: 1 %
Eosinophils Absolute: 0.1 10*3/uL (ref 0.0–0.5)
Eosinophils Relative: 2 %
HCT: 37.1 % — ABNORMAL LOW (ref 39.0–52.0)
Hemoglobin: 12.4 g/dL — ABNORMAL LOW (ref 13.0–17.0)
Immature Granulocytes: 2 %
Lymphocytes Relative: 21 %
Lymphs Abs: 1.2 10*3/uL (ref 0.7–4.0)
MCH: 29 pg (ref 26.0–34.0)
MCHC: 33.4 g/dL (ref 30.0–36.0)
MCV: 86.9 fL (ref 80.0–100.0)
Monocytes Absolute: 0.9 10*3/uL (ref 0.1–1.0)
Monocytes Relative: 16 %
Neutro Abs: 3.4 10*3/uL (ref 1.7–7.7)
Neutrophils Relative %: 58 %
Platelets: 323 10*3/uL (ref 150–400)
RBC: 4.27 MIL/uL (ref 4.22–5.81)
RDW: 14.3 % (ref 11.5–15.5)
WBC: 5.7 10*3/uL (ref 4.0–10.5)
nRBC: 0 % (ref 0.0–0.2)

## 2020-04-19 LAB — URINALYSIS, ROUTINE W REFLEX MICROSCOPIC
Bilirubin Urine: NEGATIVE
Glucose, UA: NEGATIVE mg/dL
Ketones, ur: NEGATIVE mg/dL
Nitrite: NEGATIVE
Protein, ur: NEGATIVE mg/dL
Specific Gravity, Urine: 1.011 (ref 1.005–1.030)
pH: 5 (ref 5.0–8.0)

## 2020-04-19 LAB — COMPREHENSIVE METABOLIC PANEL
ALT: 70 U/L — ABNORMAL HIGH (ref 0–44)
AST: 52 U/L — ABNORMAL HIGH (ref 15–41)
Albumin: 2.6 g/dL — ABNORMAL LOW (ref 3.5–5.0)
Alkaline Phosphatase: 111 U/L (ref 38–126)
Anion gap: 12 (ref 5–15)
BUN: 13 mg/dL (ref 8–23)
CO2: 24 mmol/L (ref 22–32)
Calcium: 8.2 mg/dL — ABNORMAL LOW (ref 8.9–10.3)
Chloride: 98 mmol/L (ref 98–111)
Creatinine, Ser: 0.78 mg/dL (ref 0.61–1.24)
GFR calc Af Amer: >60 mL/min (ref >60)
GFR calc non Af Amer: >60 mL/min (ref >60)
Glucose, Bld: 146 mg/dL — ABNORMAL HIGH (ref 70–99)
Potassium: 3.8 mmol/L (ref 3.5–5.1)
Sodium: 134 mmol/L — ABNORMAL LOW (ref 135–145)
Total Bilirubin: 0.8 mg/dL (ref 0.3–1.2)
Total Protein: 6.4 g/dL — ABNORMAL LOW (ref 6.5–8.1)

## 2020-04-19 LAB — TROPONIN I (HIGH SENSITIVITY)
Troponin I (High Sensitivity): 3 ng/L (ref <18)
Troponin I (High Sensitivity): 3 ng/L (ref <18)

## 2020-04-19 LAB — POC OCCULT BLOOD, ED: Fecal Occult Bld: POSITIVE — AB

## 2020-04-19 MED ORDER — SODIUM CHLORIDE 0.9 % IV BOLUS
1000.0000 mL | Freq: Once | INTRAVENOUS | Status: AC
  Administered 2020-04-19: 1000 mL via INTRAVENOUS

## 2020-04-19 MED ORDER — CEPHALEXIN 500 MG PO CAPS
500.0000 mg | ORAL_CAPSULE | Freq: Four times a day (QID) | ORAL | 0 refills | Status: DC
Start: 2020-04-19 — End: 2022-12-07

## 2020-04-19 NOTE — ED Provider Notes (Signed)
[facility] EMERGENCY DEPARTMENT Provider Note   CSN: [id number] Arrival date & time: 04/19/20  [id number]     History Chief Complaint  Patient presents with  . Weakness    Kyle Lambert is a 69 y.o. male.  HPI      Kyle Lambert is a 69 y.o. male with past medical history significant for coronary artery disease, hypertension, and recently diagnosed bladder cancer who presents to the Emergency Department complaining of generalized weakness and dizziness upon standing.  Symptoms have been present for 1 week.  He describes feeling weak and seeing "black spots" when he stands or moves too quickly.  Symptoms seem to improve at rest.  He endorses having some shortness of breath with exertion.  He had one episode of chest pain and jaw pain 3 days ago that was relieved with 1 nitroglycerin.  He admits to having recurrent chest pain the following day and took an additional nitroglycerin with relief.  He was seen by urology this week for lithotripsy for recently diagnosed kidney stone he was found to have a "polyp" in his bladder that was determined to be cancer.  He also had a kidney stent placed that has subsequently been removed.  He also had his second Covid vaccine this week reports some nausea and vomiting which he contributed to side effects from the vaccine.  He denies abdominal pain and the vomiting has resolved.  Patient spouse states this morning he seemed confused not knowing where he was at.  Patient believes that he had just woke up and he was "not with it yet."  He denies headache, shortness of breath, numbness of the face or extremities, facial weakness, fever or chills.  No dysuria.  No known tick bites.    Past Medical History:  Diagnosis Date  . Arthritis   . CAD (coronary artery disease)    a. 03/2012 Cath: LAD 80, RCA 80, nl EF;  b. PCI of RCA w 3.0x54mm Promus DES, FFR of LAD was nl @ 0.85 ->Med Rx.;  c. LHC 02/15/13: proximal LAD 40-50%, mid LAD 95%, small D1 95-99%, mid CFX  30-40%, proximal RCA stent patent with ostial 20-30% prior to the stent, ostial PDA 50%, EF 55-65%. PCI:  Promus DES to the mid LAD and POBA to the proximal D1   . Hyperlipidemia   . Hypertension   . Obesity     Patient Active Problem List   Diagnosis Date Noted  . Malignant neoplasm of lateral wall of urinary bladder ([place]) 04/14/2020  . Nephrolithiasis 03/10/2020  . Sepsis due to undetermined organism ([place]) 02/01/2020  . Pyelonephritis   . Obesity 05/16/2012  . CAD (coronary artery disease) 04/25/2012  . Hyperlipidemia 04/25/2012  . HTN (hypertension) 04/21/2012    Past Surgical History:  Procedure Laterality Date  . CORONARY ANGIOPLASTY WITH STENT PLACEMENT  04/24/12   DES-RCA  . CORONARY ANGIOPLASTY WITH STENT PLACEMENT  02/15/13   Patent RCA stent, diffuse nonobstructive R PDA, LCx disease, 99% mid LAD stenosis s/p DES-mid LAD; LVEF 60%  . CORONARY STENT PLACEMENT    . CYSTOSCOPY W/ RETROGRADES  04/07/2020   Procedure: CYSTOSCOPY WITH LEFT RETROGRADE PYELOGRAM;  Surgeon: Cleon Gustin, MD;  Location: [facility];  Service: Urology;;  . Consuela Mimes WITH BIOPSY  04/07/2020   Procedure: CYSTOSCOPY WITH BLADDER BIOPSY;  Surgeon: Cleon Gustin, MD;  Location: [facility];  Service: Urology;;  . Consuela Mimes WITH FULGERATION  04/07/2020   Procedure: Consuela Mimes WITH FULGERATION;  Surgeon: Nicolette Bang  L, MD;  Location: [facility];  Service: Urology;;  . CYSTOSCOPY/URETEROSCOPY/HOLMIUM LASER/STENT PLACEMENT Left 04/07/2020   Procedure: CYSTOSCOPY/LEFT URETEROSCOPY/HOLMIUM LASER LITHOTRIPSY/LEFT URETERAL STENT PLACEMENT;  Surgeon: Cleon Gustin, MD;  Location: [facility];  Service: Urology;  Laterality: Left;  . HERNIA REPAIR Left inguinal  . LEFT HEART CATH AND CORONARY ANGIOGRAPHY N/A 01/31/2019   Procedure: LEFT HEART CATH AND CORONARY ANGIOGRAPHY;  Surgeon: Sherren Mocha, MD;  Location: [place] CV [facility];  Service: Cardiovascular;  Laterality: N/A;  . LEFT HEART CATHETERIZATION WITH  CORONARY ANGIOGRAM N/A 04/21/2012   Procedure: LEFT HEART CATHETERIZATION WITH CORONARY ANGIOGRAM;  Surgeon: Hillary Bow, MD;  Location: [facility] CATH LAB;  Service: Cardiovascular;  Laterality: N/A;  . LEFT HEART CATHETERIZATION WITH CORONARY ANGIOGRAM N/A 02/15/2013   Procedure: LEFT HEART CATHETERIZATION WITH CORONARY ANGIOGRAM;  Surgeon: Sherren Mocha, MD;  Location: [facility] CATH LAB;  Service: Cardiovascular;  Laterality: N/A;  . LITHOTRIPSY    . PERCUTANEOUS CORONARY STENT INTERVENTION (PCI-S) N/A 04/24/2012   Procedure: PERCUTANEOUS CORONARY STENT INTERVENTION (PCI-S);  Surgeon: Sherren Mocha, MD;  Location: [facility] CATH LAB;  Service: Cardiovascular;  Laterality: N/A;  . PERCUTANEOUS CORONARY STENT INTERVENTION (PCI-S)  02/15/2013   Procedure: PERCUTANEOUS CORONARY STENT INTERVENTION (PCI-S);  Surgeon: Sherren Mocha, MD;  Location: [facility] CATH LAB;  Service: Cardiovascular;;       No family history on file.  Social History   Tobacco Use  . Smoking status: Never Smoker  . Smokeless tobacco: Never Used  Substance Use Topics  . Alcohol use: No    Comment: " quit along time ago"  . Drug use: No    Home Medications Prior to Admission medications   Medication Sig Start Date End Date Taking? Authorizing Provider  acetaminophen (TYLENOL) 500 MG tablet Take 1,000 mg by mouth every 6 (six) hours as needed for mild pain, moderate pain or headache.     [provider]  amLODipine (NORVASC) 5 MG tablet TAKE 1 TABLET DAILY Patient taking differently: Take 5 mg by mouth daily.  12/25/13   Sherren Mocha, MD  aspirin EC 81 MG tablet Take 81 mg by mouth daily.    [provider]  atorvastatin (LIPITOR) 40 MG tablet Take 40 mg by mouth at bedtime.     [provider]  clopidogrel (PLAVIX) 75 MG tablet TAKE 1 TABLET (75 MG TOTAL) DAILY WITH BREAKFAST. Patient taking differently: Take 75 mg by mouth daily.  12/25/13   Sherren Mocha, MD  cyclobenzaprine (FLEXERIL) 10 MG tablet Take 10  mg by mouth 3 (three) times daily as needed for muscle spasms.  10/11/18   [provider]  isosorbide mononitrate (IMDUR) 120 MG 24 hr tablet Take 1 tablet (120 mg total) by mouth daily. 08/24/17   Richardson Dopp T, PA-C  lisinopril (PRINIVIL,ZESTRIL) 20 MG tablet Take 20 mg daily by mouth.    [provider]  metFORMIN (GLUCOPHAGE) 500 MG tablet Take 500 mg by mouth 2 (two) times daily. 01/22/20   [provider]  metoprolol tartrate (LOPRESSOR) 25 MG tablet TAKE 1 TABLET TWICE A DAY Patient taking differently: Take 25 mg by mouth in the morning and at bedtime.     Sherren Mocha, MD  nitroGLYCERIN (NITROSTAT) 0.4 MG SL tablet Place 1 tablet (0.4 mg total) under the tongue every 5 (five) minutes x 3 doses as needed for chest pain. 04/25/12   Theora Gianotti, NP  ondansetron (ZOFRAN ODT) 4 MG disintegrating tablet Take 1 tablet (4 mg total) by mouth  every 8 (eight) hours as needed for nausea or vomiting. 03/25/20   Volanda Napoleon, PA-C  oxyCODONE-acetaminophen (PERCOCET) 5-325 MG tablet Take 1 tablet by mouth every 4 (four) hours as needed for moderate pain or severe pain. 04/07/20   McKenzie, Candee Furbish, MD  pantoprazole (PROTONIX) 40 MG tablet Take 1 tablet by mouth once daily Patient taking differently: Take 40 mg by mouth daily.  11/26/19   Sherren Mocha, MD  tamsulosin (FLOMAX) 0.4 MG CAPS capsule Take 1 capsule (0.4 mg total) by mouth daily after supper. Patient taking differently: Take 0.4 mg by mouth daily.  03/10/20   McKenzie, Candee Furbish, MD  tolterodine (DETROL LA) 2 MG 24 hr capsule Take 2 mg by mouth daily. 01/21/20   [provider]    Allergies    Patient has no known allergies.  Review of Systems   Review of Systems  Constitutional: Positive for activity change and chills. Negative for appetite change.  HENT: Negative for trouble swallowing.   Eyes: Positive for visual disturbance ("black dots" in visual field with standing).  Respiratory:  Negative for cough, chest tightness and shortness of breath.   Cardiovascular: Positive for chest pain (chest pain earlier this week). Negative for leg swelling.  Gastrointestinal: Positive for nausea and vomiting. Negative for abdominal pain.  Genitourinary: Negative for difficulty urinating, dysuria, flank pain and hematuria.  Musculoskeletal: Negative for arthralgias and myalgias.  Skin: Negative for rash.  Neurological: Positive for dizziness and weakness. Negative for syncope, speech difficulty, numbness and headaches.    Physical Exam Updated Vital Signs BP 98/66   Pulse 65   Temp 97.8 F (36.6 C) (Oral)   Resp (!) 25   Ht 5\' 9"  (1.753 m)   Wt 113.4 kg   SpO2 95%   BMI 36.92 kg/m   Physical Exam Vitals and nursing note reviewed. Exam conducted with a chaperone present.  Constitutional:      Appearance: Normal appearance. He is not toxic-appearing.  HENT:     Head: Atraumatic.     Mouth/Throat:     Mouth: Mucous membranes are dry.     Comments: Mucous membranes are dry Eyes:     Extraocular Movements: Extraocular movements intact.     Conjunctiva/sclera: Conjunctivae normal.  Cardiovascular:     Rate and Rhythm: Normal rate and regular rhythm.     Pulses: Normal pulses.  Pulmonary:     Effort: Pulmonary effort is normal.     Breath sounds: Normal breath sounds.  Chest:     Chest wall: No tenderness.  Abdominal:     General: There is no distension.     Palpations: Abdomen is soft.     Tenderness: There is no abdominal tenderness. There is no right CVA tenderness, left CVA tenderness or guarding.  Genitourinary:    Rectum: Guaiac result positive. No tenderness, anal fissure or external hemorrhoid. Normal anal tone.     Comments: Scantly positive guaiac rectal exam with soft brown stool present.  No palpable rectal masses.   Musculoskeletal:        General: Normal range of motion.     Cervical back: Normal range of motion. No rigidity.     Right lower leg: No  edema.     Left lower leg: No edema.  Lymphadenopathy:     Cervical: No cervical adenopathy.  Skin:    General: Skin is warm.     Capillary Refill: Capillary refill takes less than 2 seconds.     Findings:  No rash.  Neurological:     Mental Status: He is alert.     GCS: GCS eye subscore is 4. GCS verbal subscore is 5. GCS motor subscore is 6.     Sensory: Sensation is intact.     Motor: Motor function is intact. No weakness.     Coordination: Coordination is intact.     Comments: CN II-XII intact.  Speech slow, but clear.  No pronator drift.  Normal finger-nose testing.  No facial or extremity weakness     ED Results / Procedures / Treatments   Labs (all labs ordered are listed, but only abnormal results are displayed) Labs Reviewed  COMPREHENSIVE METABOLIC PANEL - Abnormal; Notable for the following components:      Result Value   Sodium 134 (*)    Glucose, Bld 146 (*)    Calcium 8.2 (*)    Total Protein 6.4 (*)    Albumin 2.6 (*)    AST 52 (*)    ALT 70 (*)    All other components within normal limits  CBC WITH DIFFERENTIAL/PLATELET - Abnormal; Notable for the following components:   Hemoglobin 12.4 (*)    HCT 37.1 (*)    Abs Immature Granulocytes 0.12 (*)    All other components within normal limits  URINALYSIS, ROUTINE W REFLEX MICROSCOPIC - Abnormal; Notable for the following components:   Color, Urine AMBER (*)    Hgb urine dipstick MODERATE (*)    Leukocytes,Ua SMALL (*)    Bacteria, UA RARE (*)    All other components within normal limits  POC OCCULT BLOOD, ED - Abnormal; Notable for the following components:   Fecal Occult Bld POSITIVE (*)    All other components within normal limits  URINE CULTURE  TROPONIN I (HIGH SENSITIVITY)  TROPONIN I (HIGH SENSITIVITY)    EKG EKG Interpretation  Date/Time:  Saturday Apr 19 2020 10:05:34 EDT Ventricular Rate:  72 PR Interval:    QRS Duration: 95 QT Interval:  424 QTC Calculation: 464 R Axis:   38 Text  Interpretation: Sinus rhythm RSR' in V1 or V2, right VCD or RVH Baseline wander in lead(s) V1 since last tracing no significant change Confirmed by Noemi Chapel [id number]) on 04/19/2020 10:59:01 AM   Radiology DG Chest 2 View  Result Date: 04/19/2020 CLINICAL DATA:  Chest pain. EXAM: CHEST - 2 VIEW COMPARISON:  Radiograph 02/01/2020 FINDINGS: Normal mediastinum and cardiac silhouette. Normal pulmonary vasculature. No evidence of effusion, infiltrate, or pneumothorax. No acute bony abnormality. Degenerative osteophytosis of the spine. IMPRESSION: No active cardiopulmonary disease. Electronically Signed   By: Suzy Bouchard M.D.   On: 04/19/2020 11:33   CT Head Wo Contrast  Result Date: 04/19/2020 CLINICAL DATA:  TIA. Generalized weakness. Intermittent episodes of altered mental status. No reported injury. EXAM: CT HEAD WITHOUT CONTRAST TECHNIQUE: Contiguous axial images were obtained from the base of the skull through the vertex without intravenous contrast. COMPARISON:  None. FINDINGS: Brain: No evidence of parenchymal hemorrhage or extra-axial fluid collection. No mass lesion, mass effect, or midline shift. No CT evidence of acute infarction. Nonspecific mild subcortical and periventricular white matter hypodensity, most in keeping with chronic small vessel ischemic change. Cerebral volume is age appropriate. No ventriculomegaly. Vascular: No acute abnormality. Skull: No evidence of calvarial fracture. Sinuses/Orbits: The visualized paranasal sinuses are essentially clear. Other:  The mastoid air cells are unopacified. IMPRESSION: 1. No evidence of acute intracranial abnormality. 2. Mild chronic small vessel ischemic changes in the cerebral white matter.  Electronically Signed   By: Ilona Sorrel M.D.   On: 04/19/2020 11:18    Procedures Procedures (including critical care time)  Medications Ordered in ED Medications  sodium chloride 0.9 % bolus 1,000 mL (has no administration in time range)    ED  Course  I have reviewed the triage vital signs and the nursing notes.  Pertinent labs & imaging results that were available during my care of the patient were reviewed by me and considered in my medical decision making (see chart for details).    MDM Rules/Calculators/A&P                      Patient here for evaluation for generalized weakness and dizziness.  Dizziness is associated with movement.  He had recent Covid vaccine and urological procedures that involved lithotripsy and ureteral stent.  He was found to have bladder cancer.  On exam, he is neurovascularly intact.  No focal neuro deficits.  He appears alert and lucid.  Nontoxic appearing.  Blood pressure initially soft.  Doubt sepsis.  Will obtain laboratory studies, chest x-ray, and CT head and order IV normal saline as a bolus.   Orthostatic VS for the past 24 hrs (Last 3 readings):  BP- Lying Pulse- Lying BP- Sitting Pulse- Sitting BP- Standing at 0 minutes Pulse- Standing at 0 minutes BP- Standing at 3 minutes Pulse- Standing at 3 minutes  04/19/20 1130 (!) 87/62 66 101/70 69 98/82 70 128/82 73    On recheck, patient has received IV fluids he is not orthostatic.  He ambulated to the restroom and nurse reported that patient was cool and clammy, but ambulated with steady gait.  He denied symptoms upon ambulating with me.  Soft blood pressure on arrival, now 112/71.  No tachycardia tachypnea or hypoxia.  I have ordered and interpreted laboratory studies that show mildly elevated transaminases and a two-point drop and his hemoglobin from May 3.  Rectal exam was performed by me, mildly guaiac positive.  Patient has GI follow-up in [place].  CT head and chest x-ray were reassuring.  Patient also seen by Dr. Sabra Heck and care plan discussed.  Will obtain urine culture start patient on antibiotics for possible urinary tract infection.  He was rechecked ambulating in the department and his gait remains steady, he denies symptoms.  He agrees to  GI follow-up in PCP follow-up for recheck of his urine.  Return precautions discussed.   Final Clinical Impression(s) / ED Diagnoses Final diagnoses:  Urinary tract infection with hematuria, site unspecified  Generalized weakness  Blood on rectal exam    Rx / DC Orders ED Discharge Orders    None       Bufford Lope 04/19/20 1549    Noemi Chapel, MD 04/20/20 1511

## 2020-04-19 NOTE — ED Notes (Signed)
Pt ambulatory with a steady gait, pt denies dizziness, states that he is ready to go home

## 2020-04-19 NOTE — Discharge Instructions (Addendum)
Your urine test today shows that you have have a urinary tract infection.  It is important that you take the antibiotic as directed until its finished.  Try to drink plenty of water.  Also, your rectal test shows some mild bleeding from your rectum.  You will need to have this followed by your GI doctor in [place].  You can call their office on Tuesday to arrange a follow-up appointment to have this rechecked.  Return the emergency department for any worsening symptoms.

## 2020-04-19 NOTE — ED Triage Notes (Signed)
Per wife, pt has had intermittent periods of nausea/vomiting, generalized weakness, cold chills, and fever since his second Covid vaccine on 5/20. Wife states that after 4 days symptoms improved. Pt had a kidney stone removed on 5/17, but denies any urinary symptoms at this time. States he also had chest pain on 5/19, took 1 nitro, but did not seek medical attention. Reports continues generalized weakness and periods of AMS. AOx4 at this time.

## 2020-04-19 NOTE — ED Notes (Signed)
During orthostatic vital signs pt reports some dizziness when going from laying down to sitting, states that he is able to continue the vs, pt denies dizziness when going from sitting to standing and was able to stand for the three minutes to complete the exam. Pt ambulatory to bathroom with steady gate, upon return to bed, pt is cool and clammy, hr 71 bp 109/70 pa notified

## 2020-04-19 NOTE — ED Provider Notes (Signed)
Patient is a 69 year old male, recently had his Covid vaccine, shortly thereafter he developed nausea vomiting diarrhea, was pretty fatigued for the last week and then gradually over the last 48 hours has been better.  He is getting some appetite back, he was able to eat and drink the last 2 days.  On my exam the patient is obese with a soft nontender abdomen, clear heart and lung sounds without tachycardia wheezing rhonchi or rales.  His vital signs reflect a blood pressure that is close to 120/80, there is no tachycardia fever or hypoxia.  His lab work suggests some synthetic dysfunction but no significant leukocytosis, no severe anemia, there has been a slight drop in his hemoglobin which will require a rectal exam prior to discharge.  The patient is eating drinking and looking well, anticipate the ability to be discharged home, will treat prophylactically for UTI given recent manipulation but culture pending.  Patient agreeable, stable for discharge  Medical screening examination/treatment/procedure(s) were conducted as a shared visit with non-physician practitioner(s) and myself.  I personally evaluated the patient during the encounter.  Clinical Impression:   Final diagnoses:  Urinary tract infection with hematuria, site unspecified  Generalized weakness  Blood on rectal exam         Noemi Chapel, MD 04/20/20 1511

## 2020-04-19 NOTE — ED Notes (Signed)
Pt refused rectal temp.

## 2020-04-22 LAB — URINE CULTURE: Culture: >=100000 — AB

## 2020-04-23 ENCOUNTER — Telehealth: Payer: Self-pay

## 2020-04-23 NOTE — Telephone Encounter (Signed)
PT with negative UC per Dr Regenia Skeeter from [place] [facility] 2/29/21  May stop Cephalexin  Msg to return call

## 2020-04-23 NOTE — Telephone Encounter (Signed)
Post ED Visit - Positive Culture Follow-up: Unsuccessful Patient Follow-up  Culture assessed and recommendations reviewed by:  []  Elenor Quinones, Pharm.D. []  Heide Guile, Pharm.D., BCPS AQ-ID []  Parks Neptune, Pharm.D., BCPS []  Alycia Rossetti, Pharm.D., BCPS []  [place], Pharm.D., BCPS, AAHIVP []  Legrand Como, Pharm.D., BCPS, AAHIVP []  Wynell Balloon, PharmD []  Vincenza Hews, PharmD, BCPS Pharm D Positive urine culture May stop Cephalexin per Dr Regenia Skeeter [facility] []  Patient discharged without antimicrobial prescription and treatment is now indicated []  Organism is resistant to prescribed ED discharge antimicrobial []  Patient with positive blood cultures   Unable to contact patient after 3 attempts, letter will be sent to address on file  Genia Del 04/23/2020, 4:24 PM

## 2020-05-22 ENCOUNTER — Telehealth: Payer: Self-pay | Admitting: Urology

## 2020-05-22 ENCOUNTER — Other Ambulatory Visit: Payer: Self-pay

## 2020-05-22 DIAGNOSIS — N200 Calculus of kidney: Secondary | ICD-10-CM

## 2020-05-22 MED ORDER — OXYCODONE-ACETAMINOPHEN 5-325 MG PO TABS: 1.0000 | ORAL_TABLET | ORAL | 0 refills | Status: DC | PRN

## 2020-05-22 NOTE — Telephone Encounter (Signed)
Rx sent 

## 2020-05-22 NOTE — Telephone Encounter (Signed)
Spoke with wife about getting u/s and KUB. Orders placed for pt to have tomorrow.  Pt asking for refill on his pain medication in the mean time please.

## 2020-05-22 NOTE — Telephone Encounter (Signed)
Patients wife called and states he is having back pain on rt side. He recently had kidney stone procedure. She asked for a nurse call.

## 2020-05-23 ENCOUNTER — Telehealth: Payer: Self-pay

## 2020-05-23 NOTE — Telephone Encounter (Signed)
Wife called and stated [facility] did not have in stock the pain prescription the md sent in yesterday. Wife is asking for an alternative.

## 2020-05-30 ENCOUNTER — Telehealth: Payer: Self-pay | Admitting: Urology

## 2020-05-30 DIAGNOSIS — Z6836 Body mass index (BMI) 36.0-36.9, adult: Secondary | ICD-10-CM | POA: Diagnosis not present

## 2020-05-30 DIAGNOSIS — C679 Malignant neoplasm of bladder, unspecified: Secondary | ICD-10-CM | POA: Diagnosis not present

## 2020-05-30 DIAGNOSIS — M62830 Muscle spasm of back: Secondary | ICD-10-CM | POA: Diagnosis not present

## 2020-05-30 DIAGNOSIS — Q631 Lobulated, fused and horseshoe kidney: Secondary | ICD-10-CM | POA: Diagnosis not present

## 2020-05-30 DIAGNOSIS — M545 Low back pain: Secondary | ICD-10-CM | POA: Diagnosis not present

## 2020-05-30 NOTE — Telephone Encounter (Signed)
Number given to wife for centralized scheduling for pt to schedule renal u/s

## 2020-05-30 NOTE — Telephone Encounter (Signed)
Left message for pt to return call.

## 2020-05-30 NOTE — Telephone Encounter (Signed)
Patients wife called and requested a nurse return her call.

## 2020-06-09 ENCOUNTER — Ambulatory Visit (HOSPITAL_COMMUNITY)
Admission: RE | Admit: 2020-06-09 | Discharge: 2020-06-09 | Disposition: A | Discharge status: Home / Self Care | Payer: PPO | Source: Ambulatory Visit | Attending: Urology | Admitting: Urology

## 2020-06-09 ENCOUNTER — Other Ambulatory Visit: Payer: Self-pay

## 2020-06-09 DIAGNOSIS — K5900 Constipation, unspecified: Secondary | ICD-10-CM | POA: Diagnosis not present

## 2020-06-09 DIAGNOSIS — N2889 Other specified disorders of kidney and ureter: Secondary | ICD-10-CM | POA: Diagnosis not present

## 2020-06-09 DIAGNOSIS — N200 Calculus of kidney: Secondary | ICD-10-CM | POA: Insufficient documentation

## 2020-06-13 ENCOUNTER — Other Ambulatory Visit: Payer: Self-pay

## 2020-06-13 DIAGNOSIS — N200 Calculus of kidney: Secondary | ICD-10-CM

## 2020-06-13 MED ORDER — TAMSULOSIN HCL 0.4 MG PO CAPS: 0.4000 mg | ORAL_CAPSULE | Freq: Every day | ORAL | 1 refills | Status: DC

## 2020-06-16 NOTE — Progress Notes (Signed)
Letter mailed

## 2020-06-20 DIAGNOSIS — H3530 Unspecified macular degeneration: Secondary | ICD-10-CM | POA: Diagnosis not present

## 2020-07-15 DIAGNOSIS — H353123 Nonexudative age-related macular degeneration, left eye, advanced atrophic without subfoveal involvement: Secondary | ICD-10-CM | POA: Diagnosis not present

## 2020-07-15 DIAGNOSIS — H353114 Nonexudative age-related macular degeneration, right eye, advanced atrophic with subfoveal involvement: Secondary | ICD-10-CM | POA: Diagnosis not present

## 2020-07-15 DIAGNOSIS — E119 Type 2 diabetes mellitus without complications: Secondary | ICD-10-CM | POA: Diagnosis not present

## 2020-07-15 DIAGNOSIS — H35033 Hypertensive retinopathy, bilateral: Secondary | ICD-10-CM | POA: Diagnosis not present

## 2020-07-16 ENCOUNTER — Other Ambulatory Visit: Payer: Self-pay

## 2020-07-16 ENCOUNTER — Ambulatory Visit: Payer: PPO | Admitting: Urology

## 2020-07-16 ENCOUNTER — Encounter: Payer: Self-pay | Admitting: Urology

## 2020-07-16 ENCOUNTER — Ambulatory Visit (INDEPENDENT_AMBULATORY_CARE_PROVIDER_SITE_OTHER): Payer: PPO | Admitting: Urology

## 2020-07-16 VITALS — BP 120/80 | HR 64 | Temp 98.1°F | Ht 69.0 in | Wt 253.0 lb

## 2020-07-16 DIAGNOSIS — C672 Malignant neoplasm of lateral wall of bladder: Secondary | ICD-10-CM

## 2020-07-16 DIAGNOSIS — N200 Calculus of kidney: Secondary | ICD-10-CM

## 2020-07-16 LAB — URINALYSIS, ROUTINE W REFLEX MICROSCOPIC
Bilirubin, UA: NEGATIVE
Glucose, UA: NEGATIVE
Nitrite, UA: NEGATIVE
Protein,UA: NEGATIVE
RBC, UA: NEGATIVE
Specific Gravity, UA: 1.025 (ref 1.005–1.030)
Urobilinogen, Ur: 2 mg/dL — ABNORMAL HIGH (ref 0.2–1.0)
pH, UA: 6.5 (ref 5.0–7.5)

## 2020-07-16 LAB — MICROSCOPIC EXAMINATION: Renal Epithel, UA: NONE SEEN /hpf

## 2020-07-16 MED ORDER — CIPROFLOXACIN HCL 500 MG PO TABS
500.0000 mg | ORAL_TABLET | Freq: Once | ORAL | Status: AC
  Administered 2020-07-16: 500 mg via ORAL

## 2020-07-16 NOTE — Patient Instructions (Signed)
Bladder Cancer  Bladder cancer is an abnormal growth of tissue in the bladder. The bladder is the balloon-like sac in the pelvis. It collects and stores urine that comes from the kidneys through the ureters. The bladder wall is made of layers. If cancer spreads into these layers and through the wall of the bladder, it becomes more difficult to treat. What are the causes? The cause of this condition is not known. What increases the risk? The following factors may make you more likely to develop this condition:  Smoking.  Workplace risks (occupational exposures), such as rubber, leather, textile, dyes, chemicals, and paint.  Being white.  Your age. Most people with bladder cancer are over the age of 55.  Being male.  Having chronic bladder inflammation.  Having a personal history of bladder cancer.  Having a family history of bladder cancer (heredity).  Having had chemotherapy or radiation therapy to the pelvis.  Having been exposed to arsenic. What are the signs or symptoms? Initial symptoms of this condition include:  Blood in the urine.  Painful urination.  Frequent bladder or urine infections.  Increase in urgency and frequency of urination. Advanced symptoms of this condition include:  Not being able to urinate.  Low back pain on one side.  Loss of appetite.  Weight loss.  Fatigue.  Swelling in the feet.  Bone pain. How is this diagnosed? This condition is diagnosed based on your medical history, a physical exam, urine tests, lab tests, imaging tests, and your symptoms. You may also have other tests or procedures done, such as:  A narrow tube being inserted into your bladder through your urethra (cystoscopy) in order to view the lining of your bladder for tumors.  A biopsy to sample the tumor to see if cancer is present. If cancer is present, it will then be staged to determine its severity and extent. Staging is an assessment of:  The size of the  tumor.  Whether the cancer has spread.  Where the cancer has spread. It is important to know how deeply into the bladder wall cancer has grown and whether cancer has spread to any other parts of your body. Staging may require blood tests or imaging tests, such as a CT scan, MRI, bone scan, or chest X-ray. How is this treated? Based on the stage of cancer, one treatment or a combination of treatments may be recommended. The most common forms of treatment are:  Surgery to remove the cancer. Procedures that may be done include transurethral resection and cystectomy.  Radiation therapy. This is high-energy X-rays or other particles. This is often used in combination with chemotherapy.  Chemotherapy. During this treatment, medicines are used to kill cancer cells.  Immunotherapy. This uses medicines to help your own immune system destroy cancer cells. Follow these instructions at home:  Take over-the-counter and prescription medicines only as told by your health care provider.  Maintain a healthy diet. Some of your treatments might affect your appetite.  Consider joining a support group. This may help you learn to cope with the stress of having bladder cancer.  Tell your cancer care team if you develop side effects. They may be able to recommend ways to relieve them.  Keep all follow-up visits as told by your health care provider. This is important. Where to find more information  American Cancer Society: www.cancer.org  National Cancer Institute (NCI): www.cancer.gov Contact a health care provider if:  You have symptoms of a urinary tract infection. These include: ?   Fever. ? Chills. ? Weakness. ? Muscle aches. ? Abdominal pain. ? Frequent and intense urge to urinate. ? Burning feeling in the bladder or urethra during urination. Get help right away if:  There is blood in your urine.  You cannot urinate.  You have severe pain or other symptoms that do not go  away. Summary  Bladder cancer is an abnormal growth of tissue in the bladder.  This condition is diagnosed based on your medical history, a physical exam, urine tests, lab tests, imaging tests, and your symptoms.  Based on the stage of cancer, surgery, chemotherapy, or a combination of treatments may be recommended.  Consider joining a support group. This may help you learn to cope with the stress of having bladder cancer. This information is not intended to replace advice given to you by your health care provider. Make sure you discuss any questions you have with your health care provider. Document Revised: 10/21/2017 Document Reviewed: 10/12/2016 Elsevier Patient Education  2020 [employer]  

## 2020-07-16 NOTE — Progress Notes (Signed)
07/16/2020 10:14 AM   [name] [birth year] [id number]  Referring provider: Curlene Labrum, MD [place],   [id number]  Nephrolithiasis and bladder cancer  HPI: Mr Heinlen is a 69yo here for followup for bladder cancer and nephrolithiasis. No stone events since last visit. Renal US from 7/19 showed a 93mm right renal calculus. No hydronephrosis. NO worsening LUTS. He denies hematuria or dysuria. He has a hx of TaG1 bladder cancer diagnosed at the time of ureteroscopic stone extraction   PMH: Past Medical History:  Diagnosis Date  . Arthritis   . CAD (coronary artery disease)    a. 03/2012 Cath: LAD 80, RCA 80, nl EF;  b. PCI of RCA w 3.0x92mm Promus DES, FFR of LAD was nl @ 0.85 ->Med Rx.;  c. LHC 02/15/13: proximal LAD 40-50%, mid LAD 95%, small D1 95-99%, mid CFX 30-40%, proximal RCA stent patent with ostial 20-30% prior to the stent, ostial PDA 50%, EF 55-65%. PCI:  Promus DES to the mid LAD and POBA to the proximal D1   . Hyperlipidemia   . Hypertension   . Obesity     Surgical History: Past Surgical History:  Procedure Laterality Date  . CORONARY ANGIOPLASTY WITH STENT PLACEMENT  04/24/12   DES-RCA  . CORONARY ANGIOPLASTY WITH STENT PLACEMENT  02/15/13   Patent RCA stent, diffuse nonobstructive R PDA, LCx disease, 99% mid LAD stenosis s/p DES-mid LAD; LVEF 60%  . CORONARY STENT PLACEMENT    . CYSTOSCOPY W/ RETROGRADES  04/07/2020   Procedure: CYSTOSCOPY WITH LEFT RETROGRADE PYELOGRAM;  Surgeon: Cleon Gustin, MD;  Location: [facility];  Service: Urology;;  . Consuela Mimes WITH BIOPSY  04/07/2020   Procedure: CYSTOSCOPY WITH BLADDER BIOPSY;  Surgeon: Cleon Gustin, MD;  Location: [facility];  Service: Urology;;  . Consuela Mimes WITH FULGERATION  04/07/2020   Procedure: Consuela Mimes WITH FULGERATION;  Surgeon: Cleon Gustin, MD;  Location: [facility];  Service: Urology;;  . CYSTOSCOPY/URETEROSCOPY/HOLMIUM LASER/STENT PLACEMENT Left 04/07/2020   Procedure:  CYSTOSCOPY/LEFT URETEROSCOPY/HOLMIUM LASER LITHOTRIPSY/LEFT URETERAL STENT PLACEMENT;  Surgeon: Cleon Gustin, MD;  Location: [facility];  Service: Urology;  Laterality: Left;  . HERNIA REPAIR Left inguinal  . LEFT HEART CATH AND CORONARY ANGIOGRAPHY N/A 01/31/2019   Procedure: LEFT HEART CATH AND CORONARY ANGIOGRAPHY;  Surgeon: Sherren Mocha, MD;  Location: [facility];  Service: Cardiovascular;  Laterality: N/A;  . LEFT HEART CATHETERIZATION WITH CORONARY ANGIOGRAM N/A 04/21/2012   Procedure: LEFT HEART CATHETERIZATION WITH CORONARY ANGIOGRAM;  Surgeon: Hillary Bow, MD;  Location: [facility] CATH LAB;  Service: Cardiovascular;  Laterality: N/A;  . LEFT HEART CATHETERIZATION WITH CORONARY ANGIOGRAM N/A 02/15/2013   Procedure: LEFT HEART CATHETERIZATION WITH CORONARY ANGIOGRAM;  Surgeon: Sherren Mocha, MD;  Location: [facility] CATH LAB;  Service: Cardiovascular;  Laterality: N/A;  . LITHOTRIPSY    . PERCUTANEOUS CORONARY STENT INTERVENTION (PCI-S) N/A 04/24/2012   Procedure: PERCUTANEOUS CORONARY STENT INTERVENTION (PCI-S);  Surgeon: Sherren Mocha, MD;  Location: [facility] CATH LAB;  Service: Cardiovascular;  Laterality: N/A;  . PERCUTANEOUS CORONARY STENT INTERVENTION (PCI-S)  02/15/2013   Procedure: PERCUTANEOUS CORONARY STENT INTERVENTION (PCI-S);  Surgeon: Sherren Mocha, MD;  Location: [facility] CATH LAB;  Service: Cardiovascular;;    Home Medications:  Allergies as of 07/16/2020   No Known Allergies     Medication List       Accurate as of July 16, 2020 10:14 AM. If you have any questions, ask your nurse or doctor.  acetaminophen 500 MG tablet Commonly known as: TYLENOL Take 1,000 mg by mouth every 6 (six) hours as needed for mild pain, moderate pain or headache.   amLODipine 5 MG tablet Commonly known as: NORVASC TAKE 1 TABLET DAILY   aspirin EC 81 MG tablet Take 81 mg by mouth daily.   atorvastatin 40 MG tablet Commonly known as: LIPITOR Take 40 mg by mouth at bedtime.     cephALEXin 500 MG capsule Commonly known as: KEFLEX Take 1 capsule (500 mg total) by mouth 4 (four) times daily.   clopidogrel 75 MG tablet Commonly known as: PLAVIX TAKE 1 TABLET (75 MG TOTAL) DAILY WITH BREAKFAST. What changed: See the new instructions.   cyclobenzaprine 10 MG tablet Commonly known as: FLEXERIL Take 10 mg by mouth 3 (three) times daily as needed for muscle spasms.   isosorbide mononitrate 120 MG 24 hr tablet Commonly known as: Imdur Take 1 tablet (120 mg total) by mouth daily.   lisinopril 20 MG tablet Commonly known as: ZESTRIL Take 20 mg daily by mouth.   metFORMIN 500 MG tablet Commonly known as: GLUCOPHAGE Take 500 mg by mouth 2 (two) times daily.   metoprolol tartrate 25 MG tablet Commonly known as: LOPRESSOR TAKE 1 TABLET TWICE A DAY What changed: when to take this   nitroGLYCERIN 0.4 MG SL tablet Commonly known as: NITROSTAT Place 1 tablet (0.4 mg total) under the tongue every 5 (five) minutes x 3 doses as needed for chest pain.   ondansetron 4 MG disintegrating tablet Commonly known as: Zofran ODT Take 1 tablet (4 mg total) by mouth every 8 (eight) hours as needed for nausea or vomiting.   oxyCODONE-acetaminophen 5-325 MG tablet Commonly known as: Percocet Take 1 tablet by mouth every 4 (four) hours as needed for moderate pain or severe pain.   pantoprazole 40 MG tablet Commonly known as: PROTONIX Take 1 tablet by mouth once daily   tamsulosin 0.4 MG Caps capsule Commonly known as: FLOMAX Take 1 capsule (0.4 mg total) by mouth daily after supper.   tolterodine 2 MG 24 hr capsule Commonly known as: DETROL LA Take 2 mg by mouth daily.       Allergies: No Known Allergies  Family History: History reviewed. No pertinent family history.  Social History:  reports that he has never smoked. He has never used smokeless tobacco. He reports that he does not drink alcohol and does not use drugs.  ROS: All other review of systems were  reviewed and are negative except what is noted above in HPI  Physical Exam: BP 120/80   Pulse 64   Temp 98.1 F (36.7 C)   Ht 5\' 9"  (1.753 m)   Wt 253 lb (114.8 kg)   BMI 37.36 kg/m   Constitutional:  Alert and oriented, No acute distress. HEENT: [place] AT, moist mucus membranes.  Trachea midline, no masses. Cardiovascular: No clubbing, cyanosis, or edema. Respiratory: Normal respiratory effort, no increased work of breathing. GI: Abdomen is soft, nontender, nondistended, no abdominal masses GU: No CVA tenderness.  Lymph: No cervical or inguinal lymphadenopathy. Skin: No rashes, bruises or suspicious lesions. Neurologic: Grossly intact, no focal deficits, moving all 4 extremities. Psychiatric: Normal mood and affect.  Laboratory Data: Lab Results  Component Value Date   WBC 5.7 04/19/2020   HGB 12.4 (L) 04/19/2020   HCT 37.1 (L) 04/19/2020   MCV 86.9 04/19/2020   PLT 323 04/19/2020    Lab Results  Component Value Date   CREATININE 0.78  04/19/2020    No results found for: PSA  No results found for: TESTOSTERONE  Lab Results  Component Value Date   HGBA1C 6.5 (H) 04/03/2020    Urinalysis    Component Value Date/Time   COLORURINE AMBER (A) 04/19/2020 1109   APPEARANCEUR Cloudy (A) 07/16/2020 0942   LABSPEC 1.011 04/19/2020 1109   PHURINE 5.0 04/19/2020 1109   GLUCOSEU Negative 07/16/2020 0942   HGBUR MODERATE (A) 04/19/2020 1109   BILIRUBINUR Negative 07/16/2020 0942   KETONESUR NEGATIVE 04/19/2020 1109   PROTEINUR Negative 07/16/2020 0942   PROTEINUR NEGATIVE 04/19/2020 1109   UROBILINOGEN 0.2 04/14/2020 1004   UROBILINOGEN 2.0 (H) 04/22/2012 1905   NITRITE Negative 07/16/2020 0942   NITRITE NEGATIVE 04/19/2020 1109   LEUKOCYTESUR 1+ (A) 07/16/2020 0942   LEUKOCYTESUR SMALL (A) 04/19/2020 1109    Lab Results  Component Value Date   LABMICR See below: 07/16/2020   WBCUA 11-30 (A) 07/16/2020   LABEPIT 0-10 07/16/2020   BACTERIA Many (A) 07/16/2020     Pertinent Imaging: Renal US 7/19: Images reviewed and disucssed with the patient Results for orders placed during the hospital encounter of 06/09/20  DG Abd 1 View  Narrative CLINICAL DATA:  Right-sided flank pain.  EXAM: ABDOMEN - 1 VIEW  COMPARISON:  June 29, 2007  FINDINGS: The bowel gas pattern is normal. Large amount of formed stool throughout the colon no radio-opaque calculi or other significant radiographic abnormality are seen.  IMPRESSION: 1. Nonobstructive bowel gas pattern. 2. Constipation. 3. No radiopaque renal calculi identified.   Electronically Signed By: Fidela Salisbury M.D. On: 06/10/2020 16:01  No results found for this or any previous visit.  No results found for this or any previous visit.  No results found for this or any previous visit.  Results for orders placed during the hospital encounter of 06/09/20  US RENAL  Narrative CLINICAL DATA:  Right flank pain for the past 2 months. History of horseshoe kidney and kidney stones.  EXAM: RENAL / URINARY TRACT ULTRASOUND COMPLETE  COMPARISON:  CT abdomen pelvis dated February 01, 2020.  FINDINGS: There is indistinctness of the bilateral renal lower poles related to known horseshoe kidney deformity.  Right Kidney:  Renal measurements: 10.2 x 0.9 x 5.2 cm = volume: 163 mL . Echogenicity within normal limits. 6 mm shadowing calculus in the midpole. No mass or hydronephrosis visualized.  Left Kidney:  Renal measurements: 13.7 x 6.4 x 5.3 cm = volume: 243 mL. Echogenicity within normal limits. No mass or hydronephrosis visualized.  Bladder:  Appears normal for degree of bladder distention.  Other:  None.  IMPRESSION: 1. 6 mm nonobstructive right nephrolithiasis. 2. Horseshoe kidney.   Electronically Signed By: Titus Dubin M.D. On: 06/10/2020 16:34  No results found for this or any previous visit.  No results found for this or any previous visit.  No results  found for this or any previous visit.      Cystoscopy Procedure Note  Patient identification was confirmed, informed consent was obtained, and patient was prepped using Betadine solution.  Lidocaine jelly was administered per urethral meatus.     Pre-Procedure: - Inspection reveals a normal caliber ureteral meatus.  Procedure: The flexible cystoscope was introduced without difficulty - No urethral strictures/lesions are present. - Enlarged prostate  - Normal bladder neck - Bilateral ureteral orifices identified - Bladder mucosa  reveals no ulcers, tumors, or lesions - No bladder stones - No trabeculation  Retroflexion shows no intravesical prostatic protrusion  Post-Procedure: - Patient tolerated the procedure well    Assessment & Plan:    1. Malignant neoplasm of lateral wall of urinary bladder (HCC) -RTC 6 months for cystoscopy - Urinalysis, Routine w reflex microscopic - ciprofloxacin (CIPRO) tablet 500 mg - Ultrasound renal complete; Future  2. Nephrolithiasis: -RTC 6 months with renal US   Return in about 6 months (around 01/16/2021) for renal US.  Nicolette Bang, MD  [facility] [place]

## 2020-07-16 NOTE — Progress Notes (Signed)

## 2020-09-01 ENCOUNTER — Ambulatory Visit: Payer: PPO | Admitting: Urology

## 2020-09-02 DIAGNOSIS — R5383 Other fatigue: Secondary | ICD-10-CM | POA: Diagnosis not present

## 2020-09-02 DIAGNOSIS — E782 Mixed hyperlipidemia: Secondary | ICD-10-CM | POA: Diagnosis not present

## 2020-09-02 DIAGNOSIS — R739 Hyperglycemia, unspecified: Secondary | ICD-10-CM | POA: Diagnosis not present

## 2020-09-02 DIAGNOSIS — I10 Essential (primary) hypertension: Secondary | ICD-10-CM | POA: Diagnosis not present

## 2020-09-02 DIAGNOSIS — R946 Abnormal results of thyroid function studies: Secondary | ICD-10-CM | POA: Diagnosis not present

## 2020-09-05 DIAGNOSIS — Q631 Lobulated, fused and horseshoe kidney: Secondary | ICD-10-CM | POA: Diagnosis not present

## 2020-09-05 DIAGNOSIS — I2510 Atherosclerotic heart disease of native coronary artery without angina pectoris: Secondary | ICD-10-CM | POA: Diagnosis not present

## 2020-09-05 DIAGNOSIS — R7303 Prediabetes: Secondary | ICD-10-CM | POA: Diagnosis not present

## 2020-09-05 DIAGNOSIS — E782 Mixed hyperlipidemia: Secondary | ICD-10-CM | POA: Diagnosis not present

## 2020-09-05 DIAGNOSIS — Z23 Encounter for immunization: Secondary | ICD-10-CM | POA: Diagnosis not present

## 2020-09-05 DIAGNOSIS — C679 Malignant neoplasm of bladder, unspecified: Secondary | ICD-10-CM | POA: Diagnosis not present

## 2020-09-05 DIAGNOSIS — Z6836 Body mass index (BMI) 36.0-36.9, adult: Secondary | ICD-10-CM | POA: Diagnosis not present

## 2020-09-05 DIAGNOSIS — I10 Essential (primary) hypertension: Secondary | ICD-10-CM | POA: Diagnosis not present

## 2020-09-20 DIAGNOSIS — I10 Essential (primary) hypertension: Secondary | ICD-10-CM | POA: Diagnosis not present

## 2020-09-20 DIAGNOSIS — M1711 Unilateral primary osteoarthritis, right knee: Secondary | ICD-10-CM | POA: Diagnosis not present

## 2020-09-20 DIAGNOSIS — I2510 Atherosclerotic heart disease of native coronary artery without angina pectoris: Secondary | ICD-10-CM | POA: Diagnosis not present

## 2020-09-20 DIAGNOSIS — E7849 Other hyperlipidemia: Secondary | ICD-10-CM | POA: Diagnosis not present

## 2020-09-29 DIAGNOSIS — Z20828 Contact with and (suspected) exposure to other viral communicable diseases: Secondary | ICD-10-CM | POA: Diagnosis not present

## 2020-09-29 DIAGNOSIS — J209 Acute bronchitis, unspecified: Secondary | ICD-10-CM | POA: Diagnosis not present

## 2020-11-17 ENCOUNTER — Other Ambulatory Visit: Payer: Self-pay | Admitting: Cardiovascular Disease

## 2020-11-21 DIAGNOSIS — M1711 Unilateral primary osteoarthritis, right knee: Secondary | ICD-10-CM | POA: Diagnosis not present

## 2020-11-21 DIAGNOSIS — I10 Essential (primary) hypertension: Secondary | ICD-10-CM | POA: Diagnosis not present

## 2020-11-21 DIAGNOSIS — I2510 Atherosclerotic heart disease of native coronary artery without angina pectoris: Secondary | ICD-10-CM | POA: Diagnosis not present

## 2020-11-21 DIAGNOSIS — E7849 Other hyperlipidemia: Secondary | ICD-10-CM | POA: Diagnosis not present

## 2020-12-19 ENCOUNTER — Other Ambulatory Visit: Payer: Self-pay

## 2020-12-19 ENCOUNTER — Ambulatory Visit (HOSPITAL_COMMUNITY)
Admission: RE | Admit: 2020-12-19 | Discharge: 2020-12-19 | Disposition: A | Discharge status: Home / Self Care | Payer: PPO | Source: Ambulatory Visit | Attending: Urology | Admitting: Urology

## 2020-12-19 DIAGNOSIS — Z8551 Personal history of malignant neoplasm of bladder: Secondary | ICD-10-CM | POA: Diagnosis not present

## 2020-12-19 DIAGNOSIS — C672 Malignant neoplasm of lateral wall of bladder: Secondary | ICD-10-CM | POA: Insufficient documentation

## 2020-12-19 DIAGNOSIS — N200 Calculus of kidney: Secondary | ICD-10-CM | POA: Insufficient documentation

## 2020-12-19 DIAGNOSIS — Z87442 Personal history of urinary calculi: Secondary | ICD-10-CM | POA: Diagnosis not present

## 2020-12-19 DIAGNOSIS — Q631 Lobulated, fused and horseshoe kidney: Secondary | ICD-10-CM | POA: Diagnosis not present

## 2020-12-19 DIAGNOSIS — N323 Diverticulum of bladder: Secondary | ICD-10-CM | POA: Diagnosis not present

## 2020-12-19 DIAGNOSIS — N289 Disorder of kidney and ureter, unspecified: Secondary | ICD-10-CM | POA: Diagnosis not present

## 2020-12-24 ENCOUNTER — Encounter: Payer: Self-pay | Admitting: Urology

## 2020-12-24 ENCOUNTER — Ambulatory Visit (INDEPENDENT_AMBULATORY_CARE_PROVIDER_SITE_OTHER): Payer: PPO | Admitting: Urology

## 2020-12-24 ENCOUNTER — Other Ambulatory Visit: Payer: Self-pay

## 2020-12-24 VITALS — BP 144/88 | HR 65 | Temp 97.9°F | Ht 71.0 in | Wt 240.0 lb

## 2020-12-24 DIAGNOSIS — N401 Enlarged prostate with lower urinary tract symptoms: Secondary | ICD-10-CM | POA: Diagnosis not present

## 2020-12-24 DIAGNOSIS — C672 Malignant neoplasm of lateral wall of bladder: Secondary | ICD-10-CM | POA: Diagnosis not present

## 2020-12-24 DIAGNOSIS — N138 Other obstructive and reflux uropathy: Secondary | ICD-10-CM | POA: Diagnosis not present

## 2020-12-24 DIAGNOSIS — R350 Frequency of micturition: Secondary | ICD-10-CM

## 2020-12-24 DIAGNOSIS — N200 Calculus of kidney: Secondary | ICD-10-CM | POA: Diagnosis not present

## 2020-12-24 LAB — URINALYSIS, ROUTINE W REFLEX MICROSCOPIC
Bilirubin, UA: NEGATIVE
Glucose, UA: NEGATIVE
Ketones, UA: NEGATIVE
Nitrite, UA: NEGATIVE
Protein,UA: NEGATIVE
RBC, UA: NEGATIVE
Specific Gravity, UA: 1.01 (ref 1.005–1.030)
Urobilinogen, Ur: 2 mg/dL — ABNORMAL HIGH (ref 0.2–1.0)
pH, UA: 5.5 (ref 5.0–7.5)

## 2020-12-24 LAB — MICROSCOPIC EXAMINATION
RBC, Urine: NONE SEEN /hpf (ref 0–2)
Renal Epithel, UA: NONE SEEN /hpf

## 2020-12-24 MED ORDER — TAMSULOSIN HCL 0.4 MG PO CAPS: 0.4000 mg | ORAL_CAPSULE | Freq: Every day | ORAL | 3 refills | Status: DC

## 2020-12-24 MED ORDER — CIPROFLOXACIN HCL 500 MG PO TABS
500.0000 mg | ORAL_TABLET | Freq: Once | ORAL | Status: AC
  Administered 2020-12-24: 500 mg via ORAL

## 2020-12-24 MED ORDER — TOLTERODINE TARTRATE ER 2 MG PO CP24: 4.0000 mg | ORAL_CAPSULE | Freq: Every day | ORAL | 11 refills | Status: DC

## 2020-12-24 NOTE — Progress Notes (Signed)
12/24/2020 11:16 AM   Kyle Lambert [birth year] [id number]  Referring provider: Curlene Labrum, MD [place],  [place] [address]  Followup BPH, nephrolithiasis and bladder cancer  HPI: Kyle Lambert is a 70yo here ofr followup for BPH with urinary frequency, nephrolithiasis and bladder cancer. NO stone events since last visit. Renal US from 12/19/2020 shows no calculi and no hydronephrosis. The patient denies any flank pain. He has stable mild LUTS on flomax and detrol. Nocturia 2-3x, frequency every 3 hours, no urgency or urge incontinence. Stream strong. Overall he is happy with his urination. No hematuria or dysuria.   PMH: Past Medical History:  Diagnosis Date  . Arthritis   . CAD (coronary artery disease)    a. 03/2012 Cath: LAD 80, RCA 80, nl EF;  b. PCI of RCA w 3.0x11mm Promus DES, FFR of LAD was nl @ 0.85 ->Med Rx.;  c. LHC 02/15/13: proximal LAD 40-50%, mid LAD 95%, small D1 95-99%, mid CFX 30-40%, proximal RCA stent patent with ostial 20-30% prior to the stent, ostial PDA 50%, EF 55-65%. PCI:  Promus DES to the mid LAD and POBA to the proximal D1   . Hyperlipidemia   . Hypertension   . Obesity     Surgical History: Past Surgical History:  Procedure Laterality Date  . CORONARY ANGIOPLASTY WITH STENT PLACEMENT  04/24/12   DES-RCA  . CORONARY ANGIOPLASTY WITH STENT PLACEMENT  02/15/13   Patent RCA stent, diffuse nonobstructive R PDA, LCx disease, 99% mid LAD stenosis s/p DES-mid LAD; LVEF 60%  . CORONARY STENT PLACEMENT    . CYSTOSCOPY W/ RETROGRADES  04/07/2020   Procedure: CYSTOSCOPY WITH LEFT RETROGRADE PYELOGRAM;  Surgeon: Cleon Gustin, MD;  Location: [facility];  Service: Urology;;  . Consuela Mimes WITH BIOPSY  04/07/2020   Procedure: CYSTOSCOPY WITH BLADDER BIOPSY;  Surgeon: Cleon Gustin, MD;  Location: [facility];  Service: Urology;;  . Consuela Mimes WITH FULGERATION  04/07/2020   Procedure: Consuela Mimes WITH FULGERATION;  Surgeon: Cleon Gustin, MD;  Location: [facility];  Service: Urology;;  . CYSTOSCOPY/URETEROSCOPY/HOLMIUM LASER/STENT PLACEMENT Left 04/07/2020   Procedure: CYSTOSCOPY/LEFT URETEROSCOPY/HOLMIUM LASER LITHOTRIPSY/LEFT URETERAL STENT PLACEMENT;  Surgeon: Cleon Gustin, MD;  Location: [facility];  Service: Urology;  Laterality: Left;  . HERNIA REPAIR Left inguinal  . LEFT HEART CATH AND CORONARY ANGIOGRAPHY N/A 01/31/2019   Procedure: LEFT HEART CATH AND CORONARY ANGIOGRAPHY;  Surgeon: Sherren Mocha, MD;  Location: [facility];  Service: Cardiovascular;  Laterality: N/A;  . LEFT HEART CATHETERIZATION WITH CORONARY ANGIOGRAM N/A 04/21/2012   Procedure: LEFT HEART CATHETERIZATION WITH CORONARY ANGIOGRAM;  Surgeon: Hillary Bow, MD;  Location: [facility] CATH LAB;  Service: Cardiovascular;  Laterality: N/A;  . LEFT HEART CATHETERIZATION WITH CORONARY ANGIOGRAM N/A 02/15/2013   Procedure: LEFT HEART CATHETERIZATION WITH CORONARY ANGIOGRAM;  Surgeon: Sherren Mocha, MD;  Location: [facility] CATH LAB;  Service: Cardiovascular;  Laterality: N/A;  . LITHOTRIPSY    . PERCUTANEOUS CORONARY STENT INTERVENTION (PCI-S) N/A 04/24/2012   Procedure: PERCUTANEOUS CORONARY STENT INTERVENTION (PCI-S);  Surgeon: Sherren Mocha, MD;  Location: [facility] CATH LAB;  Service: Cardiovascular;  Laterality: N/A;  . PERCUTANEOUS CORONARY STENT INTERVENTION (PCI-S)  02/15/2013   Procedure: PERCUTANEOUS CORONARY STENT INTERVENTION (PCI-S);  Surgeon: Sherren Mocha, MD;  Location: [facility] CATH LAB;  Service: Cardiovascular;;    Home Medications:  Allergies as of 12/24/2020   No Known Allergies     Medication List       Accurate as  of December 24, 2020 11:16 AM. If you have any questions, ask your nurse or doctor.        acetaminophen 500 MG tablet Commonly known as: TYLENOL Take 1,000 mg by mouth every 6 (six) hours as needed for mild pain, moderate pain or headache.   amLODipine 5 MG tablet Commonly known as: NORVASC TAKE 1 TABLET DAILY   aspirin EC 81 MG tablet Take 81 mg by  mouth daily.   atorvastatin 40 MG tablet Commonly known as: LIPITOR Take 40 mg by mouth at bedtime.   cephALEXin 500 MG capsule Commonly known as: KEFLEX Take 1 capsule (500 mg total) by mouth 4 (four) times daily.   clopidogrel 75 MG tablet Commonly known as: PLAVIX TAKE 1 TABLET (75 MG TOTAL) DAILY WITH BREAKFAST. What changed: See the new instructions.   cyclobenzaprine 10 MG tablet Commonly known as: FLEXERIL Take 10 mg by mouth 3 (three) times daily as needed for muscle spasms.   isosorbide mononitrate 120 MG 24 hr tablet Commonly known as: Imdur Take 1 tablet (120 mg total) by mouth daily.   lisinopril 20 MG tablet Commonly known as: ZESTRIL Take 20 mg daily by mouth.   metFORMIN 500 MG tablet Commonly known as: GLUCOPHAGE Take 500 mg by mouth 2 (two) times daily.   metoprolol tartrate 25 MG tablet Commonly known as: LOPRESSOR TAKE 1 TABLET TWICE A DAY What changed: when to take this   nitroGLYCERIN 0.4 MG SL tablet Commonly known as: NITROSTAT Place 1 tablet (0.4 mg total) under the tongue every 5 (five) minutes x 3 doses as needed for chest pain.   ondansetron 4 MG disintegrating tablet Commonly known as: Zofran ODT Take 1 tablet (4 mg total) by mouth every 8 (eight) hours as needed for nausea or vomiting.   oxyCODONE-acetaminophen 5-325 MG tablet Commonly known as: Percocet Take 1 tablet by mouth every 4 (four) hours as needed for moderate pain or severe pain.   pantoprazole 40 MG tablet Commonly known as: PROTONIX Take 1 tablet by mouth once daily   tamsulosin 0.4 MG Caps capsule Commonly known as: FLOMAX Take 1 capsule (0.4 mg total) by mouth daily after supper.   tolterodine 2 MG 24 hr capsule Commonly known as: DETROL LA Take 2 mg by mouth daily.       Allergies: No Known Allergies  Family History: No family history on file.  Social History:  reports that he has never smoked. He has never used smokeless tobacco. He reports that he does  not drink alcohol and does not use drugs.  ROS: All other review of systems were reviewed and are negative except what is noted above in HPI  Physical Exam: BP (!) 144/88   Pulse 65   Temp 97.9 F (36.6 C)   Ht 5\' 11"  (1.803 m)   Wt 240 lb (108.9 kg)   BMI 33.47 kg/m   Constitutional:  Alert and oriented, No acute distress. HEENT: [place] AT, moist mucus membranes.  Trachea midline, no masses. Cardiovascular: No clubbing, cyanosis, or edema. Respiratory: Normal respiratory effort, no increased work of breathing. GI: Abdomen is soft, nontender, nondistended, no abdominal masses GU: No CVA tenderness.  Lymph: No cervical or inguinal lymphadenopathy. Skin: No rashes, bruises or suspicious lesions. Neurologic: Grossly intact, no focal deficits, moving all 4 extremities. Psychiatric: Normal mood and affect.  Laboratory Data: Lab Results  Component Value Date   WBC 5.7 04/19/2020   HGB 12.4 (L) 04/19/2020   HCT 37.1 (L) 04/19/2020  MCV 86.9 04/19/2020   PLT 323 04/19/2020    Lab Results  Component Value Date   CREATININE 0.78 04/19/2020    No results found for: PSA  No results found for: TESTOSTERONE  Lab Results  Component Value Date   HGBA1C 6.5 (H) 04/03/2020    Urinalysis    Component Value Date/Time   COLORURINE AMBER (A) 04/19/2020 1109   APPEARANCEUR Cloudy (A) 07/16/2020 0942   LABSPEC 1.011 04/19/2020 1109   PHURINE 5.0 04/19/2020 1109   GLUCOSEU Negative 07/16/2020 0942   HGBUR MODERATE (A) 04/19/2020 1109   BILIRUBINUR Negative 07/16/2020 0942   KETONESUR NEGATIVE 04/19/2020 1109   PROTEINUR Negative 07/16/2020 0942   PROTEINUR NEGATIVE 04/19/2020 1109   UROBILINOGEN 0.2 04/14/2020 1004   UROBILINOGEN 2.0 (H) 04/22/2012 1905   NITRITE Negative 07/16/2020 0942   NITRITE NEGATIVE 04/19/2020 1109   LEUKOCYTESUR 1+ (A) 07/16/2020 0942   LEUKOCYTESUR SMALL (A) 04/19/2020 1109    Lab Results  Component Value Date   LABMICR See below: 07/16/2020    WBCUA 11-30 (A) 07/16/2020   LABEPIT 0-10 07/16/2020   BACTERIA Many (A) 07/16/2020    Pertinent Imaging: Renal US 12/19/2020: Images reviewed and discussed with the patient Results for orders placed during the hospital encounter of 06/09/20  DG Abd 1 View  Narrative CLINICAL DATA:  Right-sided flank pain.  EXAM: ABDOMEN - 1 VIEW  COMPARISON:  June 29, 2007  FINDINGS: The bowel gas pattern is normal. Large amount of formed stool throughout the colon no radio-opaque calculi or other significant radiographic abnormality are seen.  IMPRESSION: 1. Nonobstructive bowel gas pattern. 2. Constipation. 3. No radiopaque renal calculi identified.   Electronically Signed By: Fidela Salisbury M.D. On: 06/10/2020 16:01  No results found for this or any previous visit.  No results found for this or any previous visit.  No results found for this or any previous visit.  Results for orders placed during the hospital encounter of 12/19/20  Ultrasound renal complete  Narrative CLINICAL DATA:  Nephrolithiasis, history bladder cancer  EXAM: RENAL / URINARY TRACT ULTRASOUND COMPLETE  COMPARISON:  06/09/2020  Correlation: CT abdomen and pelvis 02/01/2020  FINDINGS: Right Kidney:  Renal measurements: 15.0 x 9.2 x 7.6 cm = volume: 542 mL. Suboptimally visualized due to body habitus and horseshoe morphology. No mass or hydronephrosis. Nonshadowing echogenic focus 5 mm diameter at mid kidney, nonspecific. No shadowing calculi visualized.  Left Kidney:  Renal measurements: 13.7 x 6.6 x 5.2 cm = volume: 244 mL. Suboptimally visualized due to body habitus and horseshoe morphology. Cortical thinning. Normal cortical echogenicity. No mass, hydronephrosis or shadowing calcification.  Bladder:  Appears normal for degree of bladder distention. BILATERAL ureteral jets visualized. Small posterior wall bladder diverticulum.  Other:  None.  IMPRESSION: Horseshoe kidney  without gross mass or hydronephrosis.  Small bladder diverticulum.   Electronically Signed By: Lavonia Dana M.D. On: 12/19/2020 18:17  No results found for this or any previous visit.  No results found for this or any previous visit.  No results found for this or any previous visit.   Assessment & Plan:    1. Nephrolithiasis -RTC 6 months with renal US  2. Malignant neoplasm of lateral wall of urinary bladder (HCC) -followup 6 months with cystoscopy  3. Benign prostatic hyperplasia with urinary obstruction -Continue flomax 0.4mg  daily  4. Urinary frequency -continue detrol 4mg  daily   No follow-ups on file.  Nicolette Bang, MD  [facility] [place]  Blood pressure (!) 144/88, pulse 65, temperature 97.9 F (36.6 C), height 5\' 11"  (1.803 m), weight 240 lb (108.9 kg). NED. A&Ox3.   No respiratory distress   Abd soft, NT, ND Normal external genitalia with patent urethral meatus  Cystoscopy Procedure Note  Patient identification was confirmed, informed consent was obtained, and patient was prepped using Betadine solution.  Lidocaine jelly was administered per urethral meatus.    Procedure: - Flexible cystoscope introduced, without any difficulty.   - Thorough search of the bladder revealed:    normal urethral meatus    normal urothelium    no stones    no ulcers     no tumors    no urethral polyps    no trabeculation  - Ureteral orifices were normal in position and appearance.  Post-Procedure: - Patient tolerated the procedure well

## 2020-12-24 NOTE — Progress Notes (Signed)
Urological Symptom Review  Patient is experiencing the following symptoms: Frequent urination Hard to postpone urination Burning/pain with urination Get up at night to urinate Leakage of urine Stream starts and stops Trouble starting stream Erection problems (male only)   Review of Systems  Gastrointestinal (upper)  : Negative for upper GI symptoms  Gastrointestinal (lower) : Negative for lower GI symptoms  Constitutional : Night Sweats Fatigue  Skin: Negative for skin symptoms  Eyes: Blurred vision  Ear/Nose/Throat : Negative for Ear/Nose/Throat symptoms  Hematologic/Lymphatic: Negative for Hematologic/Lymphatic symptoms  Cardiovascular : Chest pain  Respiratory : Shortness of breath  Endocrine: Excessive thirst  Musculoskeletal: Back pain Joint pain  Neurological: Dizziness Negative for neurological symptoms  Psychologic: Depression Anxiety

## 2020-12-24 NOTE — Patient Instructions (Signed)
Bladder Cancer  Bladder cancer is a condition in which abnormal tissue (a tumor) grows in the bladder. The bladder is the organ that holds urine. Two tubes (ureters) carry the urine from the kidneys to the bladder. The bladder wall is made of layers of tissue. Cancer that spreads through these layers of the bladder wall becomes more difficult to treat. What are the causes? The cause of this condition is not known. What increases the risk? The following factors may make you more likely to develop this condition:  Smoking.  Working where there are risks (occupational exposures), such as working with rubber, leather, clothing fabric, dyes, chemicals, and paint.  Being 55 years of age or older.  Being male.  Having bladder inflammation that is long-term (chronic).  Having a history of cancer, including: ? A family history of bladder cancer. ? Personal experience with bladder cancer. ? Having had certain treatments for cancer before. These include:  Medicines to kill cancer cells (chemotherapy).  Strong X-ray beams or capsules high in energy to kill cancer cells and shrink tumors (radiation therapy).  Having been exposed to arsenic. This is a chemical element that can poison you. What are the signs or symptoms? Early symptoms of this condition include:  Seeing blood in your urine.  Feeling pain when urinating.  Having infections of your urinary system (urinary tract infections or UTIs) that happen often.  Having to urinate sooner or more often than usual. Later symptoms of this condition include:  Not being able to urinate.  Pain on one side of your lower back.  Loss of appetite.  Weight loss.  Tiredness (fatigue).  Swelling in your feet.  Bone pain. How is this diagnosed? This condition is diagnosed based on:  Your medical history.  A physical exam.  Lab tests, such as urine tests.  Imaging tests.  Your symptoms. You may also have other tests or  procedures done, such as:  A cystoscopy. A narrow tube is inserted into your bladder through the organ that connects your bladder to the outside of your body (urethra). This is done to view the lining of your bladder for tumors.  A biopsy. This procedure involves removing a tissue sample to look at it under a microscope to see if cancer is present. It is important to find out:  How deeply into the bladder wall cancer has grown.  Whether cancer has spread to any other parts of your body. This may require blood tests or imaging tests, such as a CT scan, MRI, bone scan, or X-rays. How is this treated? Your health care provider may recommend one or more types of treatment based on the stage of your cancer. The most common types of treatment are:  Surgery to remove the cancer. Procedures that may be done include: ? Removing a tumor on the inside wall of the bladder (transurethral resection). ? Removing the bladder (cystectomy).  Radiation therapy. This is often used together with chemotherapy.  Chemotherapy.  Immunotherapy. This uses medicines to help your immune system destroy cancer cells. Follow these instructions at home:  Take over-the-counter and prescription medicines only as told by your health care provider.  Eat a healthy diet. Some of your treatments might affect your appetite.  Do not use any products that contain nicotine or tobacco, such as cigarettes, e-cigarettes, and chewing tobacco. If you need help quitting, ask your health care provider.  Consider joining a support group. This may help you learn to cope with the stress of having   bladder cancer.  Tell your cancer care team if you develop side effects. Your team may be able to recommend ways to get relief.  Keep all follow-up visits as told by your health care provider. This is important. Where to find more information  American Cancer Society: www.cancer.org  National Cancer Institute (NCI):  www.cancer.gov Contact a health care provider if:  You have symptoms of a urinary tract infection. These include: ? Fever. ? Chills. ? Weakness. ? Muscle aches. ? Pain in your abdomen. ? Urge to urinate that is stronger and happens more often than usual. ? Burning feeling in the bladder or urethra when you urinate. Get help right away if:  There is blood in your urine.  You cannot urinate.  You have severe pain or other symptoms that do not go away. Summary  Bladder cancer is a condition in which tumors grow in the bladder and cause illness.  This condition is diagnosed based on your medical history, a physical exam, lab tests, imaging tests, and your symptoms.  Your health care provider may recommend one or more types of treatment based on the stage of your cancer.  Consider joining a support group. This may help you learn to cope with the stress of having bladder cancer. This information is not intended to replace advice given to you by your health care provider. Make sure you discuss any questions you have with your health care provider. Document Revised: 07/18/2019 Document Reviewed: 07/18/2019 Elsevier Patient Education  2021 Elsevier Inc.  

## 2020-12-29 NOTE — Progress Notes (Signed)
Sent via mail 

## 2021-01-01 ENCOUNTER — Other Ambulatory Visit: Payer: Self-pay | Admitting: Cardiovascular Disease

## 2021-01-19 DIAGNOSIS — E7849 Other hyperlipidemia: Secondary | ICD-10-CM | POA: Diagnosis not present

## 2021-01-19 DIAGNOSIS — I10 Essential (primary) hypertension: Secondary | ICD-10-CM | POA: Diagnosis not present

## 2021-01-19 DIAGNOSIS — M1711 Unilateral primary osteoarthritis, right knee: Secondary | ICD-10-CM | POA: Diagnosis not present

## 2021-01-19 DIAGNOSIS — I2510 Atherosclerotic heart disease of native coronary artery without angina pectoris: Secondary | ICD-10-CM | POA: Diagnosis not present

## 2021-03-04 ENCOUNTER — Other Ambulatory Visit: Payer: Self-pay | Admitting: Cardiovascular Disease

## 2021-03-04 NOTE — Telephone Encounter (Signed)
Pt's pharmacy is requesting a refill on pantoprazole. Would Dr. Cooper like to refill this medication? Please address 

## 2021-03-11 DIAGNOSIS — I10 Essential (primary) hypertension: Secondary | ICD-10-CM | POA: Diagnosis not present

## 2021-03-11 DIAGNOSIS — E782 Mixed hyperlipidemia: Secondary | ICD-10-CM | POA: Diagnosis not present

## 2021-03-11 DIAGNOSIS — R7303 Prediabetes: Secondary | ICD-10-CM | POA: Diagnosis not present

## 2021-03-11 DIAGNOSIS — E7849 Other hyperlipidemia: Secondary | ICD-10-CM | POA: Diagnosis not present

## 2021-03-13 DIAGNOSIS — Q631 Lobulated, fused and horseshoe kidney: Secondary | ICD-10-CM | POA: Diagnosis not present

## 2021-03-13 DIAGNOSIS — I2510 Atherosclerotic heart disease of native coronary artery without angina pectoris: Secondary | ICD-10-CM | POA: Diagnosis not present

## 2021-03-13 DIAGNOSIS — R7303 Prediabetes: Secondary | ICD-10-CM | POA: Diagnosis not present

## 2021-03-13 DIAGNOSIS — N401 Enlarged prostate with lower urinary tract symptoms: Secondary | ICD-10-CM | POA: Diagnosis not present

## 2021-03-13 DIAGNOSIS — E7849 Other hyperlipidemia: Secondary | ICD-10-CM | POA: Diagnosis not present

## 2021-03-13 DIAGNOSIS — Z0001 Encounter for general adult medical examination with abnormal findings: Secondary | ICD-10-CM | POA: Diagnosis not present

## 2021-03-13 DIAGNOSIS — I10 Essential (primary) hypertension: Secondary | ICD-10-CM | POA: Diagnosis not present

## 2021-03-13 DIAGNOSIS — C44311 Basal cell carcinoma of skin of nose: Secondary | ICD-10-CM | POA: Diagnosis not present

## 2021-03-21 DIAGNOSIS — I10 Essential (primary) hypertension: Secondary | ICD-10-CM | POA: Diagnosis not present

## 2021-03-21 DIAGNOSIS — E7849 Other hyperlipidemia: Secondary | ICD-10-CM | POA: Diagnosis not present

## 2021-03-21 DIAGNOSIS — I2510 Atherosclerotic heart disease of native coronary artery without angina pectoris: Secondary | ICD-10-CM | POA: Diagnosis not present

## 2021-03-21 DIAGNOSIS — M1711 Unilateral primary osteoarthritis, right knee: Secondary | ICD-10-CM | POA: Diagnosis not present

## 2021-03-24 DIAGNOSIS — H35033 Hypertensive retinopathy, bilateral: Secondary | ICD-10-CM | POA: Diagnosis not present

## 2021-03-24 DIAGNOSIS — E119 Type 2 diabetes mellitus without complications: Secondary | ICD-10-CM | POA: Diagnosis not present

## 2021-03-24 DIAGNOSIS — H353114 Nonexudative age-related macular degeneration, right eye, advanced atrophic with subfoveal involvement: Secondary | ICD-10-CM | POA: Diagnosis not present

## 2021-03-24 DIAGNOSIS — H353123 Nonexudative age-related macular degeneration, left eye, advanced atrophic without subfoveal involvement: Secondary | ICD-10-CM | POA: Diagnosis not present

## 2021-03-27 DIAGNOSIS — Z20828 Contact with and (suspected) exposure to other viral communicable diseases: Secondary | ICD-10-CM | POA: Diagnosis not present

## 2021-04-01 DIAGNOSIS — Z20828 Contact with and (suspected) exposure to other viral communicable diseases: Secondary | ICD-10-CM | POA: Diagnosis not present

## 2021-04-01 DIAGNOSIS — R059 Cough, unspecified: Secondary | ICD-10-CM | POA: Diagnosis not present

## 2021-04-01 DIAGNOSIS — R509 Fever, unspecified: Secondary | ICD-10-CM | POA: Diagnosis not present

## 2021-04-16 DIAGNOSIS — C44319 Basal cell carcinoma of skin of other parts of face: Secondary | ICD-10-CM | POA: Diagnosis not present

## 2021-04-16 DIAGNOSIS — C44622 Squamous cell carcinoma of skin of right upper limb, including shoulder: Secondary | ICD-10-CM | POA: Diagnosis not present

## 2021-04-20 DIAGNOSIS — E7849 Other hyperlipidemia: Secondary | ICD-10-CM | POA: Diagnosis not present

## 2021-04-20 DIAGNOSIS — M1711 Unilateral primary osteoarthritis, right knee: Secondary | ICD-10-CM | POA: Diagnosis not present

## 2021-04-20 DIAGNOSIS — I2510 Atherosclerotic heart disease of native coronary artery without angina pectoris: Secondary | ICD-10-CM | POA: Diagnosis not present

## 2021-04-20 DIAGNOSIS — I10 Essential (primary) hypertension: Secondary | ICD-10-CM | POA: Diagnosis not present

## 2021-05-09 IMAGING — CT CT HEAD W/O CM
3 series · 15 of 47 positions shown, 18 images · non-contrast
Comparison: None.

CLINICAL DATA: TIA. Generalized weakness. Intermittent episodes of
altered mental status. No reported injury.

EXAM:
CT HEAD WITHOUT CONTRAST
TECHNIQUE: Contiguous axial images were obtained from the base of the skull
through the vertex without intravenous contrast.

[Series 3: head w o · axial · 0.47mm/px · z∈[-44,+82]mm · 9 of 31 slices shown, 12 images]
[im 3/31  brain]
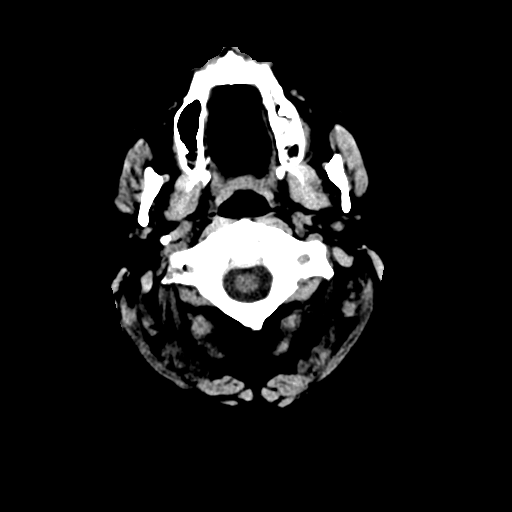
[im 3/31  bone]
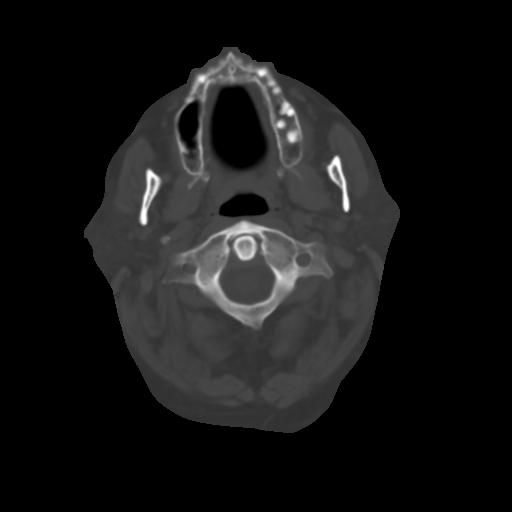
[im 6/31  brain]
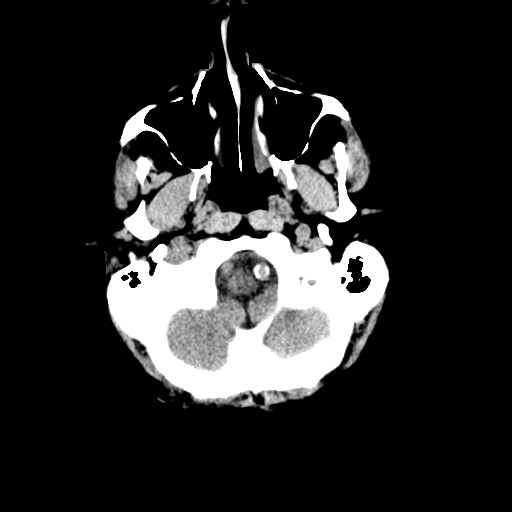
[im 9/31  brain]
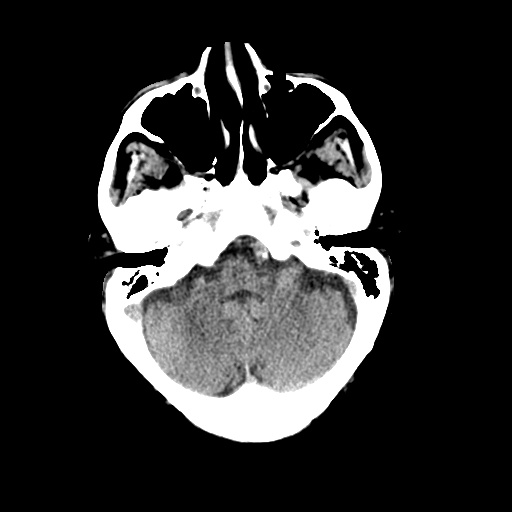
[im 12/31  brain]
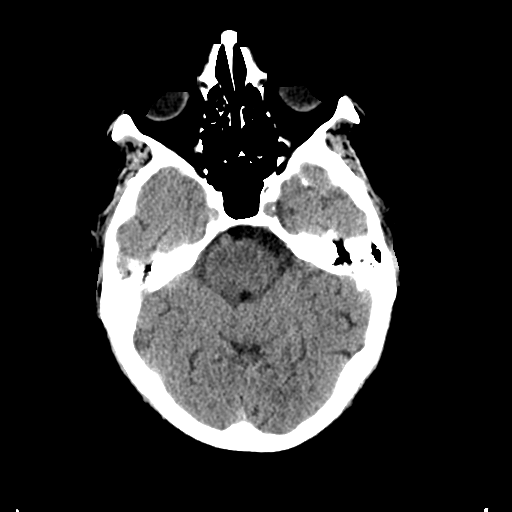
[im 16/31  brain]
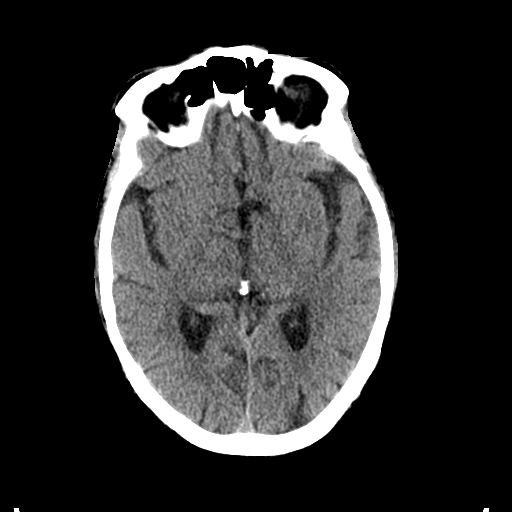
[im 16/31  bone]
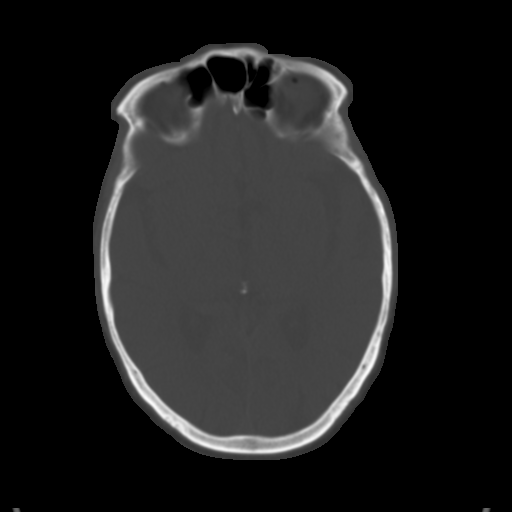
[im 19/31  brain]
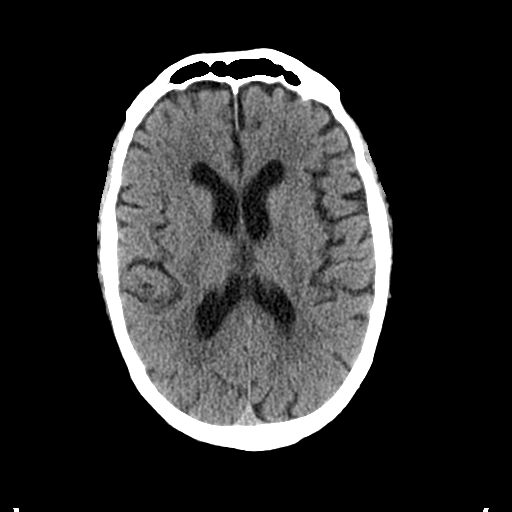
[im 22/31  brain]
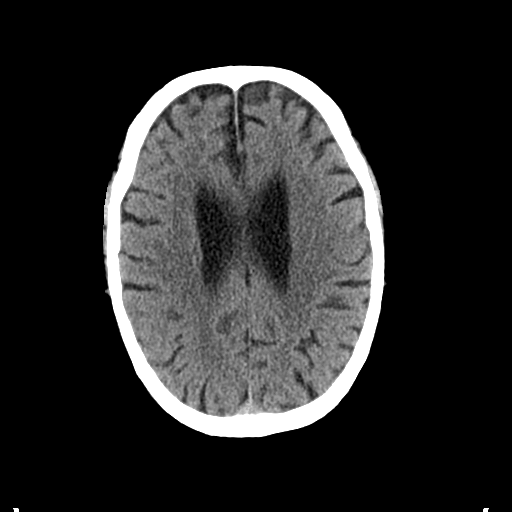
[im 25/31  brain]
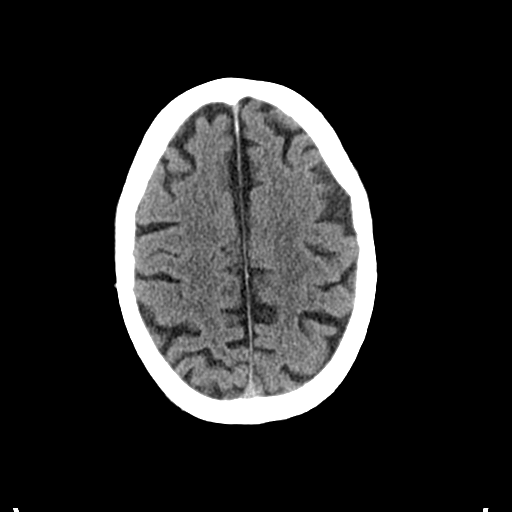
[im 28/31  brain]
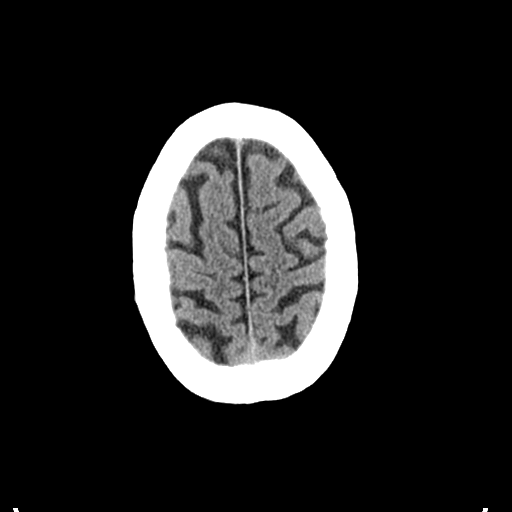
[im 28/31  bone]
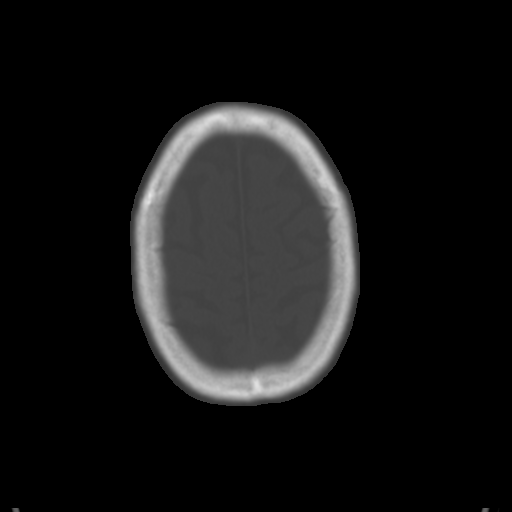

[Series 5: coronal soft · coronal · 0.35mm/px · 3 of 73 slices shown]
[im 25/73  brain]
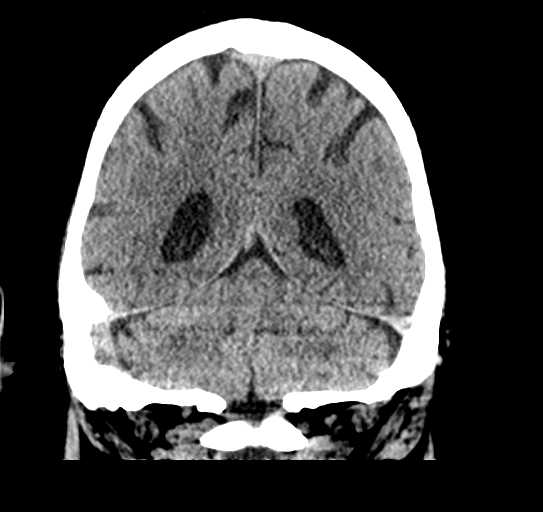
[im 33/73  brain]
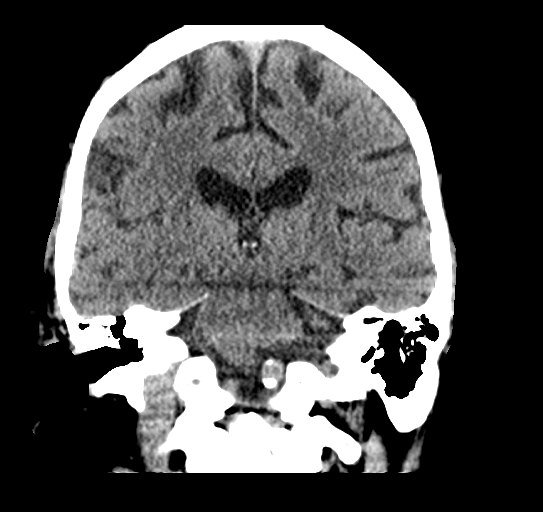
[im 41/73  brain]
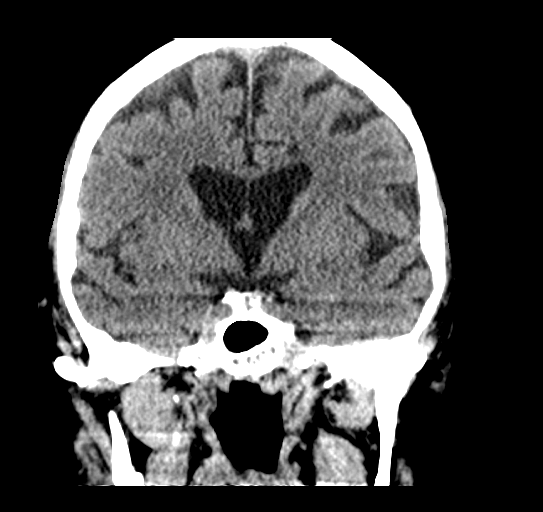

[Series 6: sagittal soft · sagittal · 0.35mm/px · 3 of 59 slices shown]
[im 20/59  brain]
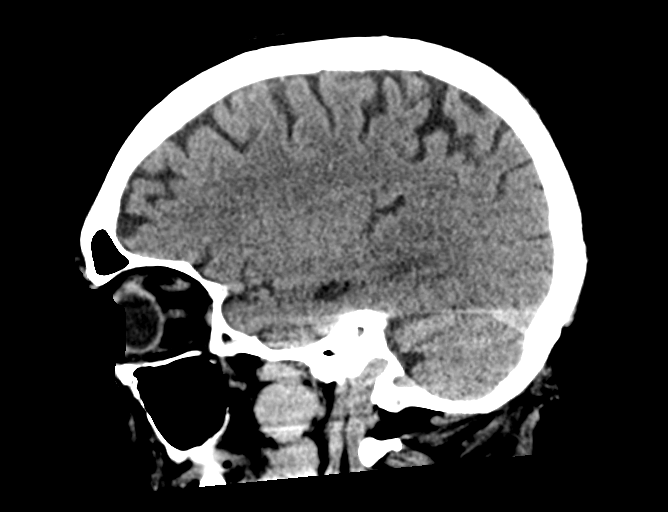
[im 30/59  brain]
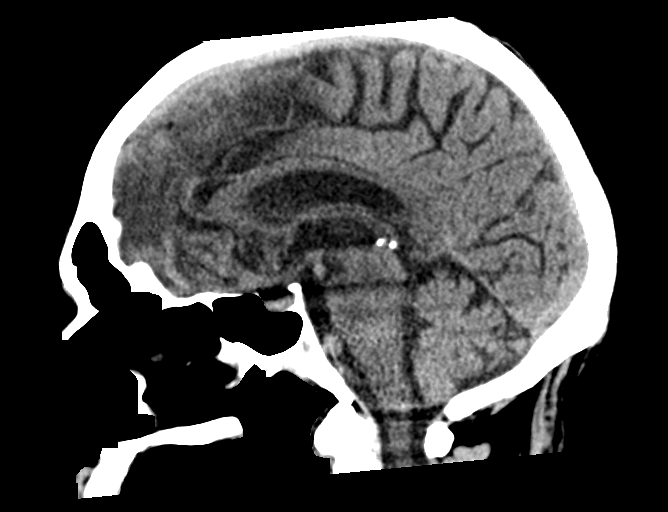
[im 39/59  brain]
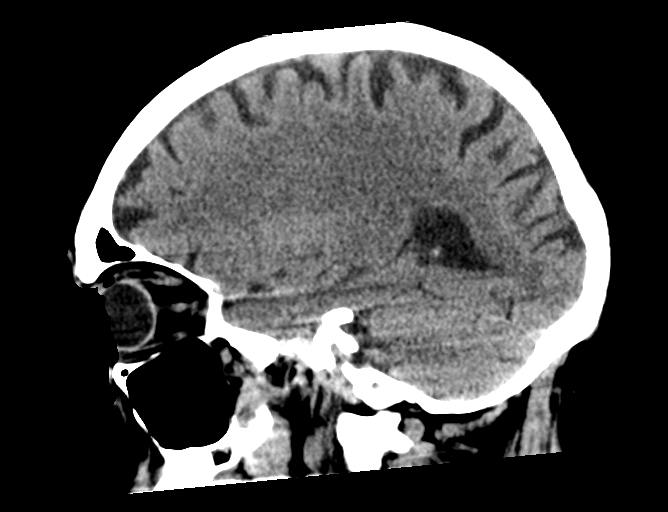

[15 of 47 positions shown; findings below may reference images not displayed]

FINDINGS: Brain: No evidence of parenchymal hemorrhage or extra-axial fluid
collection. No mass lesion, mass effect, or midline shift. No CT
evidence of acute infarction. Nonspecific mild subcortical and
periventricular white matter hypodensity, most in keeping with
chronic small vessel ischemic change. Cerebral volume is age
appropriate. No ventriculomegaly.

Vascular: No acute abnormality.

Skull: No evidence of calvarial fracture.

Sinuses/Orbits: The visualized paranasal sinuses are essentially
clear.

Other:  The mastoid air cells are unopacified.
IMPRESSION: 1. No evidence of acute intracranial abnormality.
2. Mild chronic small vessel ischemic changes in the cerebral white
matter.

## 2021-05-21 DIAGNOSIS — E7849 Other hyperlipidemia: Secondary | ICD-10-CM | POA: Diagnosis not present

## 2021-05-21 DIAGNOSIS — I10 Essential (primary) hypertension: Secondary | ICD-10-CM | POA: Diagnosis not present

## 2021-05-21 DIAGNOSIS — M1711 Unilateral primary osteoarthritis, right knee: Secondary | ICD-10-CM | POA: Diagnosis not present

## 2021-05-21 DIAGNOSIS — I2510 Atherosclerotic heart disease of native coronary artery without angina pectoris: Secondary | ICD-10-CM | POA: Diagnosis not present

## 2021-06-21 DIAGNOSIS — I10 Essential (primary) hypertension: Secondary | ICD-10-CM | POA: Diagnosis not present

## 2021-06-21 DIAGNOSIS — E7849 Other hyperlipidemia: Secondary | ICD-10-CM | POA: Diagnosis not present

## 2021-06-21 DIAGNOSIS — M1711 Unilateral primary osteoarthritis, right knee: Secondary | ICD-10-CM | POA: Diagnosis not present

## 2021-06-21 DIAGNOSIS — I2510 Atherosclerotic heart disease of native coronary artery without angina pectoris: Secondary | ICD-10-CM | POA: Diagnosis not present

## 2021-06-29 ENCOUNTER — Other Ambulatory Visit: Payer: PPO | Admitting: Urology

## 2021-06-29 DIAGNOSIS — N200 Calculus of kidney: Secondary | ICD-10-CM

## 2021-06-29 DIAGNOSIS — C672 Malignant neoplasm of lateral wall of bladder: Secondary | ICD-10-CM

## 2021-06-29 DIAGNOSIS — N138 Other obstructive and reflux uropathy: Secondary | ICD-10-CM

## 2021-06-30 DIAGNOSIS — H35033 Hypertensive retinopathy, bilateral: Secondary | ICD-10-CM | POA: Diagnosis not present

## 2021-06-30 DIAGNOSIS — H353114 Nonexudative age-related macular degeneration, right eye, advanced atrophic with subfoveal involvement: Secondary | ICD-10-CM | POA: Diagnosis not present

## 2021-06-30 DIAGNOSIS — H353221 Exudative age-related macular degeneration, left eye, with active choroidal neovascularization: Secondary | ICD-10-CM | POA: Diagnosis not present

## 2021-06-30 DIAGNOSIS — E119 Type 2 diabetes mellitus without complications: Secondary | ICD-10-CM | POA: Diagnosis not present

## 2021-07-21 ENCOUNTER — Ambulatory Visit: Payer: PPO | Admitting: Physician Assistant

## 2021-07-21 NOTE — Progress Notes (Deleted)
Cardiology Office Note:    Date:  07/21/2021   ID:  Tyres, Wierzba 1951/10/22, MRN [id number]  PCP:  Curlene Labrum, MD   [place] Providers Cardiologist:  Sherren Mocha, MD { Click to update primary MD,subspecialty MD or APP then REFRESH:1}  ***  Referring MD: Curlene Labrum, MD   Chief Complaint:  No chief complaint on file.    Patient Profile:    Kyle Lambert is a 70 y.o. male with:  Coronary artery disease  S/p DES to RCA in 2013 S/p DES to mLAD and POBA to pD1 in 2014 Myoview in 2018: low risk  Cath 01/2019: patent stents Hypertension  Hyperlipidemia  Bladder CA Dr. Alyson Ingles  Nephrolithiasis Hx of recurrent UTI; admx with pyelonephritis 3/21  Prior CV studies: Cardiac Catheterization February 14, 2019 LAD ost 40, mid stent patent; D1 ost 71 LCx mid 3; OM1 ost 30 RCA prox stent patent with 10 ISR; RPDA ost 50   Myoview 08/30/17 EF 67, diaph atten, no ischemia, Low Risk   LHC 02/15/13:  proximal LAD 40-50%, mid LAD 95%, small D1 95-99% mid CFX 30-40% proximal RCA stent patent with ostial 20-30% prior to the stent, ostial PDA 50% EF 55-65%.  PCI:  Promus DES to the mid LAD and POBA to the proximal D1.    History of Present Illness: Kyle Lambert was last seen via telemedicine in 04/2019.  Since that time, he has been admitted several times with recurrent UTI in the setting of nephrolithiasis.  He was admitted with sepsis and pyelonephritis in 3/21.  He has been diagnosed with bladder cancer.  He is followed by Dr. Alyson Ingles with urology.  He returns for cardiology follow-up.***    Past Medical History:  Diagnosis Date   Arthritis    CAD (coronary artery disease)    a. 03/2012 Cath: LAD 80, RCA 80, nl EF;  b. PCI of RCA w 3.0x3m Promus DES, FFR of LAD was nl @ 0.85 ->Med Rx.;  c. LHC 02/15/13: proximal LAD 40-50%, mid LAD 95%, small D1 95-99%, mid CFX 30-40%, proximal RCA stent patent with ostial 20-30% prior to the stent, ostial PDA 50%, EF 55-65%. PCI:   Promus DES to the mid LAD and POBA to the proximal D1    Hyperlipidemia    Hypertension    Obesity     Current Medications: No outpatient medications have been marked as taking for the 07/21/21 encounter (Appointment) with WRichardson DoppT, PA-C.     Allergies:   Patient has no known allergies.   Social History   Tobacco Use   Smoking status: Never   Smokeless tobacco: Never  Vaping Use   Vaping Use: Never used  Substance Use Topics   Alcohol use: No    Comment: " quit along time ago"   Drug use: No     Family Hx: The patient's family history is not on file.  ROS   EKGs/Labs/Other Test Reviewed:    EKG:  EKG is *** ordered today.  The ekg ordered today demonstrates ***  Recent Labs: No results found for requested labs within last 8760 hours.   Recent Lipid Panel No results found for: CHOL, TRIG, HDL, LDLCALC, LDLDIRECT    Risk Assessment/Calculations:   {Does this patient have ATRIAL FIBRILLATION?[phone]}      Physical Exam:    VS:  There were no vitals taken for this visit.    Wt Readings from Last 3 Encounters:  12/24/20 240 lb (108.9 kg)  07/16/20 253 lb (114.8 kg)  04/19/20 250 lb (113.4 kg)     Physical Exam ***     ASSESSMENT & PLAN:    {Select for Dx:25819} Coronary artery disease of native artery of native heart with stable angina pectoris ([place]) History of DES to the RCA in 2013 and DES to the mid LAD with balloon angioplasty to the proximal D1 in 2014.  Recent cardiac catheterization demonstrated patent stents and moderate nonobstructive disease elsewhere.  He is currently doing well without recurrent angina on his present medical regimen.  Continue current therapy.   Essential hypertension The patient's blood pressure is controlled on his current regimen.  Continue current therapy.    Mixed hyperlipidemia Continue high intensity statin.    {Are you ordering a CV Procedure (e.g. stress test, cath, DCCV, TEE, etc)?   Press F2         :[id number]    Dispo:  No follow-ups on file.   Medication Adjustments/Labs and Tests Ordered: Current medicines are reviewed at length with the patient today.  Concerns regarding medicines are outlined above.  Tests Ordered: No orders of the defined types were placed in this encounter.  Medication Changes: No orders of the defined types were placed in this encounter.   Signed, Richardson Dopp, PA-C  07/21/2021 1:22 PM    [facility] [address], [place],   [address] Phone: [phone]; Fax: [phone]

## 2021-07-22 DIAGNOSIS — E7849 Other hyperlipidemia: Secondary | ICD-10-CM | POA: Diagnosis not present

## 2021-07-22 DIAGNOSIS — I2510 Atherosclerotic heart disease of native coronary artery without angina pectoris: Secondary | ICD-10-CM | POA: Diagnosis not present

## 2021-07-22 DIAGNOSIS — M1711 Unilateral primary osteoarthritis, right knee: Secondary | ICD-10-CM | POA: Diagnosis not present

## 2021-07-22 DIAGNOSIS — I10 Essential (primary) hypertension: Secondary | ICD-10-CM | POA: Diagnosis not present

## 2021-07-28 DIAGNOSIS — H353221 Exudative age-related macular degeneration, left eye, with active choroidal neovascularization: Secondary | ICD-10-CM | POA: Diagnosis not present

## 2021-07-28 DIAGNOSIS — H353114 Nonexudative age-related macular degeneration, right eye, advanced atrophic with subfoveal involvement: Secondary | ICD-10-CM | POA: Diagnosis not present

## 2021-08-25 DIAGNOSIS — H2513 Age-related nuclear cataract, bilateral: Secondary | ICD-10-CM | POA: Diagnosis not present

## 2021-08-25 DIAGNOSIS — H353114 Nonexudative age-related macular degeneration, right eye, advanced atrophic with subfoveal involvement: Secondary | ICD-10-CM | POA: Diagnosis not present

## 2021-08-25 DIAGNOSIS — H353221 Exudative age-related macular degeneration, left eye, with active choroidal neovascularization: Secondary | ICD-10-CM | POA: Diagnosis not present

## 2021-08-25 DIAGNOSIS — H35033 Hypertensive retinopathy, bilateral: Secondary | ICD-10-CM | POA: Diagnosis not present

## 2021-09-03 DIAGNOSIS — R946 Abnormal results of thyroid function studies: Secondary | ICD-10-CM | POA: Diagnosis not present

## 2021-09-03 DIAGNOSIS — E782 Mixed hyperlipidemia: Secondary | ICD-10-CM | POA: Diagnosis not present

## 2021-09-03 DIAGNOSIS — R739 Hyperglycemia, unspecified: Secondary | ICD-10-CM | POA: Diagnosis not present

## 2021-09-03 DIAGNOSIS — E7849 Other hyperlipidemia: Secondary | ICD-10-CM | POA: Diagnosis not present

## 2021-09-03 DIAGNOSIS — Z1159 Encounter for screening for other viral diseases: Secondary | ICD-10-CM | POA: Diagnosis not present

## 2021-09-03 DIAGNOSIS — R7303 Prediabetes: Secondary | ICD-10-CM | POA: Diagnosis not present

## 2021-09-07 DIAGNOSIS — I2510 Atherosclerotic heart disease of native coronary artery without angina pectoris: Secondary | ICD-10-CM | POA: Diagnosis not present

## 2021-09-07 DIAGNOSIS — R7303 Prediabetes: Secondary | ICD-10-CM | POA: Diagnosis not present

## 2021-09-07 DIAGNOSIS — I10 Essential (primary) hypertension: Secondary | ICD-10-CM | POA: Diagnosis not present

## 2021-09-07 DIAGNOSIS — C679 Malignant neoplasm of bladder, unspecified: Secondary | ICD-10-CM | POA: Diagnosis not present

## 2021-09-07 DIAGNOSIS — E7849 Other hyperlipidemia: Secondary | ICD-10-CM | POA: Diagnosis not present

## 2021-09-07 DIAGNOSIS — Q631 Lobulated, fused and horseshoe kidney: Secondary | ICD-10-CM | POA: Diagnosis not present

## 2021-09-07 DIAGNOSIS — Z0001 Encounter for general adult medical examination with abnormal findings: Secondary | ICD-10-CM | POA: Diagnosis not present

## 2021-09-07 DIAGNOSIS — Z23 Encounter for immunization: Secondary | ICD-10-CM | POA: Diagnosis not present

## 2021-10-06 DIAGNOSIS — H353114 Nonexudative age-related macular degeneration, right eye, advanced atrophic with subfoveal involvement: Secondary | ICD-10-CM | POA: Diagnosis not present

## 2021-10-06 DIAGNOSIS — H353221 Exudative age-related macular degeneration, left eye, with active choroidal neovascularization: Secondary | ICD-10-CM | POA: Diagnosis not present

## 2021-11-02 DIAGNOSIS — J111 Influenza due to unidentified influenza virus with other respiratory manifestations: Secondary | ICD-10-CM | POA: Diagnosis not present

## 2021-11-02 DIAGNOSIS — Z20828 Contact with and (suspected) exposure to other viral communicable diseases: Secondary | ICD-10-CM | POA: Diagnosis not present

## 2021-12-01 DIAGNOSIS — H35033 Hypertensive retinopathy, bilateral: Secondary | ICD-10-CM | POA: Diagnosis not present

## 2021-12-01 DIAGNOSIS — E119 Type 2 diabetes mellitus without complications: Secondary | ICD-10-CM | POA: Diagnosis not present

## 2021-12-01 DIAGNOSIS — H353114 Nonexudative age-related macular degeneration, right eye, advanced atrophic with subfoveal involvement: Secondary | ICD-10-CM | POA: Diagnosis not present

## 2021-12-01 DIAGNOSIS — H353221 Exudative age-related macular degeneration, left eye, with active choroidal neovascularization: Secondary | ICD-10-CM | POA: Diagnosis not present

## 2021-12-03 DIAGNOSIS — R7303 Prediabetes: Secondary | ICD-10-CM | POA: Diagnosis not present

## 2021-12-03 DIAGNOSIS — R739 Hyperglycemia, unspecified: Secondary | ICD-10-CM | POA: Diagnosis not present

## 2021-12-03 DIAGNOSIS — I10 Essential (primary) hypertension: Secondary | ICD-10-CM | POA: Diagnosis not present

## 2021-12-03 DIAGNOSIS — R946 Abnormal results of thyroid function studies: Secondary | ICD-10-CM | POA: Diagnosis not present

## 2021-12-07 DIAGNOSIS — Q631 Lobulated, fused and horseshoe kidney: Secondary | ICD-10-CM | POA: Diagnosis not present

## 2021-12-07 DIAGNOSIS — R7303 Prediabetes: Secondary | ICD-10-CM | POA: Diagnosis not present

## 2021-12-07 DIAGNOSIS — I2510 Atherosclerotic heart disease of native coronary artery without angina pectoris: Secondary | ICD-10-CM | POA: Diagnosis not present

## 2021-12-07 DIAGNOSIS — C679 Malignant neoplasm of bladder, unspecified: Secondary | ICD-10-CM | POA: Diagnosis not present

## 2021-12-07 DIAGNOSIS — I10 Essential (primary) hypertension: Secondary | ICD-10-CM | POA: Diagnosis not present

## 2021-12-07 DIAGNOSIS — G44201 Tension-type headache, unspecified, intractable: Secondary | ICD-10-CM | POA: Diagnosis not present

## 2021-12-07 DIAGNOSIS — G4733 Obstructive sleep apnea (adult) (pediatric): Secondary | ICD-10-CM | POA: Diagnosis not present

## 2021-12-07 DIAGNOSIS — E7849 Other hyperlipidemia: Secondary | ICD-10-CM | POA: Diagnosis not present

## 2021-12-29 DIAGNOSIS — H353114 Nonexudative age-related macular degeneration, right eye, advanced atrophic with subfoveal involvement: Secondary | ICD-10-CM | POA: Diagnosis not present

## 2021-12-29 DIAGNOSIS — H353221 Exudative age-related macular degeneration, left eye, with active choroidal neovascularization: Secondary | ICD-10-CM | POA: Diagnosis not present

## 2022-01-26 DIAGNOSIS — H353114 Nonexudative age-related macular degeneration, right eye, advanced atrophic with subfoveal involvement: Secondary | ICD-10-CM | POA: Diagnosis not present

## 2022-01-26 DIAGNOSIS — H353221 Exudative age-related macular degeneration, left eye, with active choroidal neovascularization: Secondary | ICD-10-CM | POA: Diagnosis not present

## 2022-02-04 DIAGNOSIS — R519 Headache, unspecified: Secondary | ICD-10-CM | POA: Diagnosis not present

## 2022-02-04 DIAGNOSIS — Z6835 Body mass index (BMI) 35.0-35.9, adult: Secondary | ICD-10-CM | POA: Diagnosis not present

## 2022-02-04 DIAGNOSIS — K0889 Other specified disorders of teeth and supporting structures: Secondary | ICD-10-CM | POA: Diagnosis not present

## 2022-02-04 DIAGNOSIS — I959 Hypotension, unspecified: Secondary | ICD-10-CM | POA: Diagnosis not present

## 2022-02-05 DIAGNOSIS — I959 Hypotension, unspecified: Secondary | ICD-10-CM | POA: Diagnosis not present

## 2022-02-05 DIAGNOSIS — R519 Headache, unspecified: Secondary | ICD-10-CM | POA: Diagnosis not present

## 2022-02-05 DIAGNOSIS — K0889 Other specified disorders of teeth and supporting structures: Secondary | ICD-10-CM | POA: Diagnosis not present

## 2022-02-11 DIAGNOSIS — Q631 Lobulated, fused and horseshoe kidney: Secondary | ICD-10-CM | POA: Diagnosis not present

## 2022-02-11 DIAGNOSIS — I10 Essential (primary) hypertension: Secondary | ICD-10-CM | POA: Diagnosis not present

## 2022-02-11 DIAGNOSIS — R7303 Prediabetes: Secondary | ICD-10-CM | POA: Diagnosis not present

## 2022-02-11 DIAGNOSIS — E7849 Other hyperlipidemia: Secondary | ICD-10-CM | POA: Diagnosis not present

## 2022-02-11 DIAGNOSIS — G44209 Tension-type headache, unspecified, not intractable: Secondary | ICD-10-CM | POA: Diagnosis not present

## 2022-02-11 DIAGNOSIS — I2510 Atherosclerotic heart disease of native coronary artery without angina pectoris: Secondary | ICD-10-CM | POA: Diagnosis not present

## 2022-02-11 DIAGNOSIS — G4733 Obstructive sleep apnea (adult) (pediatric): Secondary | ICD-10-CM | POA: Diagnosis not present

## 2022-02-11 DIAGNOSIS — C679 Malignant neoplasm of bladder, unspecified: Secondary | ICD-10-CM | POA: Diagnosis not present

## 2022-02-23 DIAGNOSIS — H353221 Exudative age-related macular degeneration, left eye, with active choroidal neovascularization: Secondary | ICD-10-CM | POA: Diagnosis not present

## 2022-02-23 DIAGNOSIS — E119 Type 2 diabetes mellitus without complications: Secondary | ICD-10-CM | POA: Diagnosis not present

## 2022-02-23 DIAGNOSIS — H353114 Nonexudative age-related macular degeneration, right eye, advanced atrophic with subfoveal involvement: Secondary | ICD-10-CM | POA: Diagnosis not present

## 2022-02-23 DIAGNOSIS — H35033 Hypertensive retinopathy, bilateral: Secondary | ICD-10-CM | POA: Diagnosis not present

## 2022-05-11 ENCOUNTER — Other Ambulatory Visit: Payer: PPO | Admitting: Physician Assistant

## 2022-05-12 ENCOUNTER — Encounter: Payer: Self-pay | Admitting: Urology

## 2022-05-12 ENCOUNTER — Ambulatory Visit (INDEPENDENT_AMBULATORY_CARE_PROVIDER_SITE_OTHER): Payer: PPO | Admitting: Urology

## 2022-05-12 VITALS — BP 119/72 | HR 90

## 2022-05-12 DIAGNOSIS — Z87442 Personal history of urinary calculi: Secondary | ICD-10-CM

## 2022-05-12 DIAGNOSIS — C672 Malignant neoplasm of lateral wall of bladder: Secondary | ICD-10-CM

## 2022-05-12 DIAGNOSIS — N3000 Acute cystitis without hematuria: Secondary | ICD-10-CM | POA: Diagnosis not present

## 2022-05-12 DIAGNOSIS — Z8551 Personal history of malignant neoplasm of bladder: Secondary | ICD-10-CM

## 2022-05-12 DIAGNOSIS — N200 Calculus of kidney: Secondary | ICD-10-CM

## 2022-05-12 MED ORDER — CIPROFLOXACIN HCL 500 MG PO TABS: 500.0000 mg | ORAL_TABLET | Freq: Once | ORAL | Status: DC

## 2022-05-12 MED ORDER — NITROFURANTOIN MONOHYD MACRO 100 MG PO CAPS: 100.0000 mg | ORAL_CAPSULE | Freq: Two times a day (BID) | ORAL | 0 refills | Status: DC

## 2022-05-12 NOTE — Patient Instructions (Signed)
Urinary Tract Infection, Adult A urinary tract infection (UTI) is an infection of any part of the urinary tract. The urinary tract includes: The kidneys. The ureters. The bladder. The urethra. These organs make, store, and get rid of pee (urine) in the body. What are the causes? This infection is caused by germs (bacteria) in your genital area. These germs grow and cause swelling (inflammation) of your urinary tract. What increases the risk? The following factors may make you more likely to develop this condition: Using a small, thin tube (catheter) to drain pee. Not being able to control when you pee or poop (incontinence). Being male. If you are male, these things can increase the risk: Using these methods to prevent pregnancy: A medicine that kills sperm (spermicide). A device that blocks sperm (diaphragm). Having low levels of a male hormone (estrogen). Being pregnant. You are more likely to develop this condition if: You have genes that add to your risk. You are sexually active. You take antibiotic medicines. You have trouble peeing because of: A prostate that is bigger than normal, if you are male. A blockage in the part of your body that drains pee from the bladder. A kidney stone. A nerve condition that affects your bladder. Not getting enough to drink. Not peeing often enough. You have other conditions, such as: Diabetes. A weak disease-fighting system (immune system). Sickle cell disease. Gout. Injury of the spine. What are the signs or symptoms? Symptoms of this condition include: Needing to pee right away. Peeing small amounts often. Pain or burning when peeing. Blood in the pee. Pee that smells bad or not like normal. Trouble peeing. Pee that is cloudy. Fluid coming from the vagina, if you are male. Pain in the belly or lower back. Other symptoms include: Vomiting. Not feeling hungry. Feeling mixed up (confused). This may be the first symptom in  older adults. Being tired and grouchy (irritable). A fever. Watery poop (diarrhea). How is this treated? Taking antibiotic medicine. Taking other medicines. Drinking enough water. In some cases, you may need to see a specialist. Follow these instructions at home:  Medicines Take over-the-counter and prescription medicines only as told by your doctor. If you were prescribed an antibiotic medicine, take it as told by your doctor. Do not stop taking it even if you start to feel better. General instructions Make sure you: Pee until your bladder is empty. Do not hold pee for a long time. Empty your bladder after sex. Wipe from front to back after peeing or pooping if you are a male. Use each tissue one time when you wipe. Drink enough fluid to keep your pee pale yellow. Keep all follow-up visits. Contact a doctor if: You do not get better after 1-2 days. Your symptoms go away and then come back. Get help right away if: You have very bad back pain. You have very bad pain in your lower belly. You have a fever. You have chills. You feeling like you will vomit or you vomit. Summary A urinary tract infection (UTI) is an infection of any part of the urinary tract. This condition is caused by germs in your genital area. There are many risk factors for a UTI. Treatment includes antibiotic medicines. Drink enough fluid to keep your pee pale yellow. This information is not intended to replace advice given to you by your health care provider. Make sure you discuss any questions you have with your health care provider. Document Revised: 06/20/2020 Document Reviewed: 06/20/2020 Elsevier Patient Education    2023 [employer] Inc[employer]  

## 2022-05-12 NOTE — Progress Notes (Signed)
05/12/2022 4:12 PM   Prudencio Burly [birth year] [id number]  Referring provider: Juliette Alcide, MD [address] [place],  [place] [address]  dysuria   HPI: Mr Thiesen is a 71yo here for followup for bladder cancer. He started taking toviaz 4mg  daily which caused difficulty urinating. Over the past 2 weeks he has noted worsening urinary urgency, frequency, dysuria. UA is concerning for infection. No stone events since last visit. He is due for cystoscopy today   PMH: Past Medical History:  Diagnosis Date   Arthritis    CAD (coronary artery disease)    a. 03/2012 Cath: LAD 80, RCA 80, nl EF;  b. PCI of RCA w 3.0x75mm Promus DES, FFR of LAD was nl @ 0.85 ->Med Rx.;  c. LHC 02/15/13: proximal LAD 40-50%, mid LAD 95%, small D1 95-99%, mid CFX 30-40%, proximal RCA stent patent with ostial 20-30% prior to the stent, ostial PDA 50%, EF 55-65%. PCI:  Promus DES to the mid LAD and POBA to the proximal D1    Hyperlipidemia    Hypertension    Obesity     Surgical History: Past Surgical History:  Procedure Laterality Date   CORONARY ANGIOPLASTY WITH STENT PLACEMENT  04/24/12   DES-RCA   CORONARY ANGIOPLASTY WITH STENT PLACEMENT  02/15/13   Patent RCA stent, diffuse nonobstructive R PDA, LCx disease, 99% mid LAD stenosis s/p DES-mid LAD; LVEF 60%   CORONARY STENT PLACEMENT     CYSTOSCOPY W/ RETROGRADES  04/07/2020   Procedure: CYSTOSCOPY WITH LEFT RETROGRADE PYELOGRAM;  Surgeon: Malen Gauze, MD;  Location: [facility];  Service: Urology;;   CYSTOSCOPY WITH BIOPSY  04/07/2020   Procedure: CYSTOSCOPY WITH BLADDER BIOPSY;  Surgeon: Malen Gauze, MD;  Location: [facility];  Service: Urology;;   Bluford Kaufmann WITH FULGERATION  04/07/2020   Procedure: Bluford Kaufmann WITH FULGERATION;  Surgeon: Malen Gauze, MD;  Location: [facility];  Service: Urology;;   CYSTOSCOPY/URETEROSCOPY/HOLMIUM LASER/STENT PLACEMENT Left 04/07/2020   Procedure: CYSTOSCOPY/LEFT URETEROSCOPY/HOLMIUM LASER LITHOTRIPSY/LEFT URETERAL  STENT PLACEMENT;  Surgeon: Malen Gauze, MD;  Location: [facility];  Service: Urology;  Laterality: Left;   HERNIA REPAIR Left inguinal   LEFT HEART CATH AND CORONARY ANGIOGRAPHY N/A 01/31/2019   Procedure: LEFT HEART CATH AND CORONARY ANGIOGRAPHY;  Surgeon: Tonny Bollman, MD;  Location: [facility] INVASIVE CV LAB;  Service: Cardiovascular;  Laterality: N/A;   LEFT HEART CATHETERIZATION WITH CORONARY ANGIOGRAM N/A 04/21/2012   Procedure: LEFT HEART CATHETERIZATION WITH CORONARY ANGIOGRAM;  Surgeon: Herby Abraham, MD;  Location: [facility] CATH LAB;  Service: Cardiovascular;  Laterality: N/A;   LEFT HEART CATHETERIZATION WITH CORONARY ANGIOGRAM N/A 02/15/2013   Procedure: LEFT HEART CATHETERIZATION WITH CORONARY ANGIOGRAM;  Surgeon: Tonny Bollman, MD;  Location: [facility] CATH LAB;  Service: Cardiovascular;  Laterality: N/A;   LITHOTRIPSY     PERCUTANEOUS CORONARY STENT INTERVENTION (PCI-S) N/A 04/24/2012   Procedure: PERCUTANEOUS CORONARY STENT INTERVENTION (PCI-S);  Surgeon: Tonny Bollman, MD;  Location: [facility] CATH LAB;  Service: Cardiovascular;  Laterality: N/A;   PERCUTANEOUS CORONARY STENT INTERVENTION (PCI-S)  02/15/2013   Procedure: PERCUTANEOUS CORONARY STENT INTERVENTION (PCI-S);  Surgeon: Tonny Bollman, MD;  Location: [facility] CATH LAB;  Service: Cardiovascular;;    Home Medications:  Allergies as of 05/12/2022   No Known Allergies      Medication List        Accurate as of May 12, 2022  4:12 PM. If you have any questions, ask your nurse or doctor.  acetaminophen 500 MG tablet Commonly known as: TYLENOL Take 1,000 mg by mouth every 6 (six) hours as needed for mild pain, moderate pain or headache.   amLODipine 5 MG tablet Commonly known as: NORVASC TAKE 1 TABLET DAILY   aspirin EC 81 MG tablet Take 81 mg by mouth daily.   atorvastatin 40 MG tablet Commonly known as: LIPITOR Take 40 mg by mouth at bedtime.   cephALEXin 500 MG capsule Commonly known as: KEFLEX Take 1 capsule (500  mg total) by mouth 4 (four) times daily.   clopidogrel 75 MG tablet Commonly known as: PLAVIX TAKE 1 TABLET (75 MG TOTAL) DAILY WITH BREAKFAST. What changed: See the new instructions.   cyclobenzaprine 10 MG tablet Commonly known as: FLEXERIL Take 10 mg by mouth 3 (three) times daily as needed for muscle spasms.   fesoterodine 4 MG Tb24 tablet Commonly known as: TOVIAZ Take 4 mg by mouth daily.   isosorbide mononitrate 120 MG 24 hr tablet Commonly known as: Imdur Take 1 tablet (120 mg total) by mouth daily.   lisinopril 20 MG tablet Commonly known as: ZESTRIL Take 20 mg daily by mouth.   metFORMIN 500 MG tablet Commonly known as: GLUCOPHAGE Take 500 mg by mouth 2 (two) times daily.   metoprolol tartrate 25 MG tablet Commonly known as: LOPRESSOR TAKE 1 TABLET TWICE A DAY What changed: when to take this   nitrofurantoin (macrocrystal-monohydrate) 100 MG capsule Commonly known as: MACROBID Take 1 capsule (100 mg total) by mouth every 12 (twelve) hours.   nitroGLYCERIN 0.4 MG SL tablet Commonly known as: NITROSTAT Place 1 tablet (0.4 mg total) under the tongue every 5 (five) minutes x 3 doses as needed for chest pain.   ondansetron 4 MG disintegrating tablet Commonly known as: Zofran ODT Take 1 tablet (4 mg total) by mouth every 8 (eight) hours as needed for nausea or vomiting.   oxyCODONE-acetaminophen 5-325 MG tablet Commonly known as: Percocet Take 1 tablet by mouth every 4 (four) hours as needed for moderate pain or severe pain.   pantoprazole 40 MG tablet Commonly known as: PROTONIX TAKE 1 TABLET BY MOUTH ONCE DAILY --  NEEDS  APPOINTMENT  FOR  FURTHER  REFILLS   tamsulosin 0.4 MG Caps capsule Commonly known as: FLOMAX Take 1 capsule (0.4 mg total) by mouth daily after supper.   tolterodine 2 MG 24 hr capsule Commonly known as: DETROL LA Take 2 capsules (4 mg total) by mouth daily.        Allergies: No Known Allergies  Family History: No family  history on file.  Social History:  reports that he has never smoked. He has never used smokeless tobacco. He reports that he does not drink alcohol and does not use drugs.  ROS: All other review of systems were reviewed and are negative except what is noted above in HPI  Physical Exam: BP 119/72   Pulse 90   Constitutional:  Alert and oriented, No acute distress. HEENT: [place] AT, moist mucus membranes.  Trachea midline, no masses. Cardiovascular: No clubbing, cyanosis, or edema. Respiratory: Normal respiratory effort, no increased work of breathing. GI: Abdomen is soft, nontender, nondistended, no abdominal masses GU: No CVA tenderness.  Lymph: No cervical or inguinal lymphadenopathy. Skin: No rashes, bruises or suspicious lesions. Neurologic: Grossly intact, no focal deficits, moving all 4 extremities. Psychiatric: Normal mood and affect.  Laboratory Data: Lab Results  Component Value Date   WBC 5.7 04/19/2020   HGB 12.4 (L) 04/19/2020  HCT 37.1 (L) 04/19/2020   MCV 86.9 04/19/2020   PLT 323 04/19/2020    Lab Results  Component Value Date   CREATININE 0.78 04/19/2020    No results found for: "PSA"  No results found for: "TESTOSTERONE"  Lab Results  Component Value Date   HGBA1C 6.5 (H) 04/03/2020    Urinalysis    Component Value Date/Time   COLORURINE AMBER (A) 04/19/2020 1109   APPEARANCEUR Clear 12/24/2020 1326   LABSPEC 1.011 04/19/2020 1109   PHURINE 5.0 04/19/2020 1109   GLUCOSEU Negative 12/24/2020 1326   HGBUR MODERATE (A) 04/19/2020 1109   BILIRUBINUR Negative 12/24/2020 1326   KETONESUR NEGATIVE 04/19/2020 1109   PROTEINUR Negative 12/24/2020 1326   PROTEINUR NEGATIVE 04/19/2020 1109   UROBILINOGEN 0.2 04/14/2020 1004   UROBILINOGEN 2.0 (H) 04/22/2012 1905   NITRITE Negative 12/24/2020 1326   NITRITE NEGATIVE 04/19/2020 1109   LEUKOCYTESUR 1+ (A) 12/24/2020 1326   LEUKOCYTESUR SMALL (A) 04/19/2020 1109    Lab Results  Component Value Date    LABMICR See below: 12/24/2020   WBCUA 6-10 (A) 12/24/2020   LABEPIT 0-10 12/24/2020   BACTERIA Moderate (A) 12/24/2020    Pertinent Imaging:  Results for orders placed during the hospital encounter of 06/09/20  DG Abd 1 View  Narrative CLINICAL DATA:  Right-sided flank pain.  EXAM: ABDOMEN - 1 VIEW  COMPARISON:  June 29, 2007  FINDINGS: The bowel gas pattern is normal. Large amount of formed stool throughout the colon no radio-opaque calculi or other significant radiographic abnormality are seen.  IMPRESSION: 1. Nonobstructive bowel gas pattern. 2. Constipation. 3. No radiopaque renal calculi identified.   Electronically Signed By: Ted Mcalpine M.D. On: 06/10/2020 16:01  No results found for this or any previous visit.  No results found for this or any previous visit.  No results found for this or any previous visit.  Results for orders placed during the hospital encounter of 12/19/20  Ultrasound renal complete  Narrative CLINICAL DATA:  Nephrolithiasis, history bladder cancer  EXAM: RENAL / URINARY TRACT ULTRASOUND COMPLETE  COMPARISON:  06/09/2020  Correlation: CT abdomen and pelvis 02/01/2020  FINDINGS: Right Kidney:  Renal measurements: 15.0 x 9.2 x 7.6 cm = volume: 542 mL. Suboptimally visualized due to body habitus and horseshoe morphology. No mass or hydronephrosis. Nonshadowing echogenic focus 5 mm diameter at mid kidney, nonspecific. No shadowing calculi visualized.  Left Kidney:  Renal measurements: 13.7 x 6.6 x 5.2 cm = volume: 244 mL. Suboptimally visualized due to body habitus and horseshoe morphology. Cortical thinning. Normal cortical echogenicity. No mass, hydronephrosis or shadowing calcification.  Bladder:  Appears normal for degree of bladder distention. BILATERAL ureteral jets visualized. Small posterior wall bladder diverticulum.  Other:  None.  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  Small bladder diverticulum.   Electronically Signed By: Ulyses Southward M.D. On: 12/19/2020 18:17  No results found for this or any previous visit.  No results found for this or any previous visit.  No results found for this or any previous visit.   Assessment & Plan:    1. Hx of nephrolithiasis - Urinalysis, Routine w reflex microscopic - ciprofloxacin (CIPRO) tablet 500 mg  2. Acute cystitis without hematuria -Urine for culture -Macrobid 100mg  BID for 7 days - Urine Culture   No follow-ups on file.  Wilkie Aye, MD  [facility] [place]

## 2022-05-15 LAB — URINE CULTURE

## 2022-05-18 ENCOUNTER — Ambulatory Visit (HOSPITAL_COMMUNITY)
Admission: RE | Admit: 2022-05-18 | Discharge: 2022-05-18 | Disposition: A | Discharge status: Home / Self Care | Payer: PPO | Source: Ambulatory Visit | Attending: Urology | Admitting: Urology

## 2022-05-18 DIAGNOSIS — N323 Diverticulum of bladder: Secondary | ICD-10-CM | POA: Diagnosis not present

## 2022-05-18 DIAGNOSIS — N200 Calculus of kidney: Secondary | ICD-10-CM | POA: Diagnosis not present

## 2022-05-18 DIAGNOSIS — Q631 Lobulated, fused and horseshoe kidney: Secondary | ICD-10-CM | POA: Diagnosis not present

## 2022-05-18 DIAGNOSIS — N3289 Other specified disorders of bladder: Secondary | ICD-10-CM | POA: Diagnosis not present

## 2022-05-21 ENCOUNTER — Ambulatory Visit (INDEPENDENT_AMBULATORY_CARE_PROVIDER_SITE_OTHER): Payer: PPO | Admitting: Urology

## 2022-05-21 VITALS — BP 140/81 | HR 82

## 2022-05-21 DIAGNOSIS — C672 Malignant neoplasm of lateral wall of bladder: Secondary | ICD-10-CM

## 2022-05-21 DIAGNOSIS — N200 Calculus of kidney: Secondary | ICD-10-CM

## 2022-05-21 DIAGNOSIS — Z8551 Personal history of malignant neoplasm of bladder: Secondary | ICD-10-CM

## 2022-05-21 LAB — URINALYSIS, ROUTINE W REFLEX MICROSCOPIC
Bilirubin, UA: NEGATIVE
Glucose, UA: NEGATIVE
Nitrite, UA: NEGATIVE
Protein,UA: NEGATIVE
RBC, UA: NEGATIVE
Specific Gravity, UA: >1.03 — ABNORMAL HIGH (ref 1.005–1.030)
Urobilinogen, Ur: 2 mg/dL — ABNORMAL HIGH (ref 0.2–1.0)
pH, UA: 5.5 (ref 5.0–7.5)

## 2022-05-21 LAB — MICROSCOPIC EXAMINATION
Epithelial Cells (non renal): NONE SEEN /hpf (ref 0–10)
RBC, Urine: NONE SEEN /hpf (ref 0–2)
Renal Epithel, UA: NONE SEEN /hpf

## 2022-05-21 MED ORDER — CIPROFLOXACIN HCL 500 MG PO TABS: 500.0000 mg | ORAL_TABLET | Freq: Once | ORAL | Status: DC

## 2022-05-21 MED ORDER — TAMSULOSIN HCL 0.4 MG PO CAPS: 0.4000 mg | ORAL_CAPSULE | Freq: Two times a day (BID) | ORAL | 3 refills | Status: DC

## 2022-05-21 NOTE — Progress Notes (Signed)
   05/21/22  CC: followup bladder cancer   HPI: Mr Sprung is a 71yo here for followup for bladder cancer Blood pressure 140/81, pulse 82. NED. A&Ox3.   No respiratory distress   Abd soft, NT, ND Normal phallus with bilateral descended testicles  Cystoscopy Procedure Note  Patient identification was confirmed, informed consent was obtained, and patient was prepped using Betadine solution.  Lidocaine jelly was administered per urethral meatus.     Pre-Procedure: - Inspection reveals a normal caliber ureteral meatus.  Procedure: The flexible cystoscope was introduced without difficulty - No urethral strictures/lesions are present. - Enlarged prostate  - Normal bladder neck - Bilateral ureteral orifices identified - Bladder mucosa  reveals no ulcers, tumors, or lesions - No bladder stones - No trabeculation  Retroflexion shows no intravesical prostatic protrusion   Post-Procedure: - Patient tolerated the procedure well  Assessment/ Plan: Incrase flomax to BID. RTC  78month for cystoscopy  No follow-ups on file.  PNicolette Bang MD

## 2022-06-01 ENCOUNTER — Encounter: Payer: Self-pay | Admitting: Urology

## 2022-06-01 NOTE — Patient Instructions (Signed)

## 2022-06-02 LAB — MICROSCOPIC EXAMINATION: Renal Epithel, UA: NONE SEEN /hpf

## 2022-06-02 LAB — URINALYSIS, ROUTINE W REFLEX MICROSCOPIC
Bilirubin, UA: NEGATIVE
Glucose, UA: NEGATIVE
Nitrite, UA: POSITIVE — AB
Specific Gravity, UA: 1.025 (ref 1.005–1.030)
Urobilinogen, Ur: 2 mg/dL — ABNORMAL HIGH (ref 0.2–1.0)
pH, UA: 6 (ref 5.0–7.5)

## 2022-06-03 DIAGNOSIS — I10 Essential (primary) hypertension: Secondary | ICD-10-CM | POA: Diagnosis not present

## 2022-06-03 DIAGNOSIS — R739 Hyperglycemia, unspecified: Secondary | ICD-10-CM | POA: Diagnosis not present

## 2022-06-03 DIAGNOSIS — R946 Abnormal results of thyroid function studies: Secondary | ICD-10-CM | POA: Diagnosis not present

## 2022-06-03 DIAGNOSIS — E7849 Other hyperlipidemia: Secondary | ICD-10-CM | POA: Diagnosis not present

## 2022-06-03 DIAGNOSIS — R7303 Prediabetes: Secondary | ICD-10-CM | POA: Diagnosis not present

## 2022-06-07 DIAGNOSIS — I10 Essential (primary) hypertension: Secondary | ICD-10-CM | POA: Diagnosis not present

## 2022-06-07 DIAGNOSIS — R519 Headache, unspecified: Secondary | ICD-10-CM | POA: Diagnosis not present

## 2022-06-07 DIAGNOSIS — C679 Malignant neoplasm of bladder, unspecified: Secondary | ICD-10-CM | POA: Diagnosis not present

## 2022-06-07 DIAGNOSIS — R7303 Prediabetes: Secondary | ICD-10-CM | POA: Diagnosis not present

## 2022-06-07 DIAGNOSIS — H9319 Tinnitus, unspecified ear: Secondary | ICD-10-CM | POA: Diagnosis not present

## 2022-06-07 DIAGNOSIS — I2510 Atherosclerotic heart disease of native coronary artery without angina pectoris: Secondary | ICD-10-CM | POA: Diagnosis not present

## 2022-06-07 DIAGNOSIS — G4733 Obstructive sleep apnea (adult) (pediatric): Secondary | ICD-10-CM | POA: Diagnosis not present

## 2022-06-07 DIAGNOSIS — Z6834 Body mass index (BMI) 34.0-34.9, adult: Secondary | ICD-10-CM | POA: Diagnosis not present

## 2022-06-07 DIAGNOSIS — E7849 Other hyperlipidemia: Secondary | ICD-10-CM | POA: Diagnosis not present

## 2022-06-22 DIAGNOSIS — R519 Headache, unspecified: Secondary | ICD-10-CM | POA: Diagnosis not present

## 2022-06-22 DIAGNOSIS — H9313 Tinnitus, bilateral: Secondary | ICD-10-CM | POA: Diagnosis not present

## 2022-07-21 ENCOUNTER — Encounter: Payer: Self-pay | Admitting: *Deleted

## 2022-07-27 ENCOUNTER — Encounter: Payer: Self-pay | Admitting: Psychiatry

## 2022-07-27 ENCOUNTER — Ambulatory Visit: Payer: PPO | Admitting: Psychiatry

## 2022-07-27 VITALS — BP 124/79 | HR 85 | Ht 69.0 in | Wt 232.0 lb

## 2022-07-27 DIAGNOSIS — R51 Headache with orthostatic component, not elsewhere classified: Secondary | ICD-10-CM

## 2022-07-27 MED ORDER — GABAPENTIN 300 MG PO CAPS
ORAL_CAPSULE | ORAL | 6 refills | Status: DC
Start: 2022-07-27 — End: 2024-02-16

## 2022-07-27 NOTE — Patient Instructions (Addendum)
Start gabapentin. Take 1 pill at bedtime for one week, then take 1 pill twice a day for one week, then take 1 pill three times a day  CT scan of veins in the brain

## 2022-07-27 NOTE — Progress Notes (Signed)
Referring:  Curlene Labrum, MD [place],  [place] [address]  PCP: Curlene Labrum, MD  Neurology was asked to evaluate Kyle Lambert, a 71 year old male for a chief complaint of headaches.  Our recommendations of care will be communicated by shared medical record.    CC:  headaches  History provided from self, spouse Kyle Lambert  HPI:  Medical co-morbidities: kidney stones, bladder cancer, macular degeneration, CAD s/p stent, HTN, HLD, prediabetes, OSA  The patient presents for evaluation of headaches which began in March 2023. They are triggered by coughing, sneezing, and bending forward. Headaches are described as pressure his vertex. No phonophobia or nausea. He does have some photophobia at baseline due to macular degeneration. They last 10-15 minutes at a time and are improved with lying down. Takes Tylenol as needed which does help.   MRI/MRA done on 06/22/22 were unremarkable.  Headache History: Onset: March 2023 Triggers: coughing, sneezing, bending forward Aura: no Location: vertex Quality/Description: pressure Associated Symptoms:  Photophobia: yes at baseline  Phonophobia: no  Nausea: no Worse with activity?: yes Duration of headaches: 10-15 minutes  Headache days per month: 30 Headache free days per month: 0  Current Treatment: Abortive Tylenol  Preventative none  Prior Therapies                                 Tylenol Metoprolol 25 mg BID Lisinopril 20 mg daily Flexeril 10 mg PRN  LABS: 06/03/22: TSH, CMP unremarkable  IMAGING:  MRI/MRA brain 06/22/22: unremarkable   Current Outpatient Medications on File Prior to Visit  Medication Sig Dispense Refill   acetaminophen (TYLENOL) 500 MG tablet Take 1,000 mg by mouth every 6 (six) hours as needed for mild pain, moderate pain or headache.      amLODipine (NORVASC) 5 MG tablet TAKE 1 TABLET DAILY (Patient taking differently: Take 5 mg by mouth daily.) 90 tablet 0   aspirin EC 81 MG tablet Take 81  mg by mouth daily.     atorvastatin (LIPITOR) 40 MG tablet Take 40 mg by mouth at bedtime.      cephALEXin (KEFLEX) 500 MG capsule Take 1 capsule (500 mg total) by mouth 4 (four) times daily. 28 capsule 0   clopidogrel (PLAVIX) 75 MG tablet TAKE 1 TABLET (75 MG TOTAL) DAILY WITH BREAKFAST. (Patient taking differently: Take 75 mg by mouth daily.) 90 tablet 0   cyclobenzaprine (FLEXERIL) 10 MG tablet Take 10 mg by mouth 3 (three) times daily as needed for muscle spasms.      fesoterodine (TOVIAZ) 4 MG TB24 tablet Take 4 mg by mouth daily.     isosorbide mononitrate (IMDUR) 120 MG 24 hr tablet Take 1 tablet (120 mg total) by mouth daily. 90 tablet 3   lisinopril (PRINIVIL,ZESTRIL) 20 MG tablet Take 20 mg daily by mouth.     metFORMIN (GLUCOPHAGE) 500 MG tablet Take 500 mg by mouth 2 (two) times daily.     metoprolol tartrate (LOPRESSOR) 25 MG tablet TAKE 1 TABLET TWICE A DAY (Patient taking differently: Take 25 mg by mouth in the morning and at bedtime.) 60 tablet 0   nitrofurantoin, macrocrystal-monohydrate, (MACROBID) 100 MG capsule Take 1 capsule (100 mg total) by mouth every 12 (twelve) hours. 14 capsule 0   nitroGLYCERIN (NITROSTAT) 0.4 MG SL tablet Place 1 tablet (0.4 mg total) under the tongue every 5 (five) minutes x 3 doses as needed for  chest pain. 25 tablet 3   ondansetron (ZOFRAN ODT) 4 MG disintegrating tablet Take 1 tablet (4 mg total) by mouth every 8 (eight) hours as needed for nausea or vomiting. 6 tablet 0   oxyCODONE-acetaminophen (PERCOCET) 5-325 MG tablet Take 1 tablet by mouth every 4 (four) hours as needed for moderate pain or severe pain. 30 tablet 0   pantoprazole (PROTONIX) 40 MG tablet TAKE 1 TABLET BY MOUTH ONCE DAILY --  NEEDS  APPOINTMENT  FOR  FURTHER  REFILLS 15 tablet 0   tamsulosin (FLOMAX) 0.4 MG CAPS capsule Take 1 capsule (0.4 mg total) by mouth in the morning and at bedtime. 180 capsule 3   tolterodine (DETROL LA) 2 MG 24 hr capsule Take 2 capsules (4 mg total) by  mouth daily. 30 capsule 11   Current Facility-Administered Medications on File Prior to Visit  Medication Dose Route Frequency Provider Last Rate Last Admin   ciprofloxacin (CIPRO) tablet 500 mg  500 mg Oral Once McKenzie, Candee Furbish, MD       ciprofloxacin (CIPRO) tablet 500 mg  500 mg Oral Once McKenzie, Candee Furbish, MD         Allergies: No Known Allergies  Family History: Migraine or other headaches in the family:  no Aneurysms in a first degree relative:  no Brain tumors in the family:  no Other neurological illness in the family:   older brother had a stroke and seizures  Past Medical History: Past Medical History:  Diagnosis Date   Arthritis    Bladder cancer ([place]) 03/2020   CAD (coronary artery disease)    a. 03/2012 Cath: LAD 80, RCA 80, nl EF;  b. PCI of RCA w 3.0x34m Promus DES, FFR of LAD was nl @ 0.85 ->Med Rx.;  c. LHC 02/15/13: proximal LAD 40-50%, mid LAD 95%, small D1 95-99%, mid CFX 30-40%, proximal RCA stent patent with ostial 20-30% prior to the stent, ostial PDA 50%, EF 55-65%. PCI:  Promus DES to the mid LAD and POBA to the proximal D1    Headache    Hyperlipidemia    Hypertension    Kidney stone    Obesity    OSA (obstructive sleep apnea)    Umbilical hernia     Past Surgical History Past Surgical History:  Procedure Laterality Date   CORONARY ANGIOPLASTY WITH STENT PLACEMENT  04/24/12   DES-RCA   CORONARY ANGIOPLASTY WITH STENT PLACEMENT  02/15/13   Patent RCA stent, diffuse nonobstructive R PDA, LCx disease, 99% mid LAD stenosis s/p DES-mid LAD; LVEF 60%   CORONARY STENT PLACEMENT     CYSTOSCOPY W/ RETROGRADES  04/07/2020   Procedure: CYSTOSCOPY WITH LEFT RETROGRADE PYELOGRAM;  Surgeon: MCleon Gustin MD;  [facility];  Service: Urology;;   CYSTOSCOPY WITH BIOPSY  04/07/2020   Procedure: CYSTOSCOPY WITH BLADDER BIOPSY;  Surgeon: MCleon Gustin MD;  Location: [facility];  Service: Urology;;   [place] 04/07/2020    Procedure: CConsuela MimesWITH FULGERATION;  Surgeon: MCleon Gustin MD;  Location: [facility];  Service: Urology;;   CYSTOSCOPY/URETEROSCOPY/HOLMIUM LASER/STENT PLACEMENT Left 04/07/2020   Procedure: CYSTOSCOPY/LEFT URETEROSCOPY/HOLMIUM LASER LITHOTRIPSY/LEFT URETERAL STENT PLACEMENT;  Surgeon: MCleon Gustin MD;  [facility]: [facility];  Service: Urology;  Laterality: Left;   HERNIA REPAIR Left inguinal   LEFT HEART CATH AND CORONARY ANGIOGRAPHY N/A 01/31/2019   Procedure: LEFT HEART CATH AND CORONARY ANGIOGRAPHY;  Surgeon: CSherren Mocha MD;  [facility];  Service: Cardiovascular;  Laterality: N/A;   LEFT HEART CATHETERIZATION WITH CORONARY ANGIOGRAM N/A 04/21/2012   Procedure: LEFT HEART CATHETERIZATION WITH CORONARY ANGIOGRAM;  Surgeon: Hillary Bow, MD;  Location[facility] CATH LAB;  Service: Cardiovascular;  Laterality: N/A;   LEFT HEART CATHETERIZATION WITH CORONARY ANGIOGRAM N/A 02/15/2013   Procedure: LEFT HEART CATHETERIZATION WITH CORONARY ANGIOGRAM;  Surgeon: Sherren Mocha, MD;  Location: [facility] CATH LAB;  Service: Cardiovascular;  Laterality: N/A;   LITHOTRIPSY     PERCUTANEOUS CORONARY STENT INTERVENTION (PCI-S) N/A 04/24/2012   Procedure: PERCUTANEOUS CORONARY STENT INTERVENTION (PCI-S);  Surgeon: Sherren Mocha, MD;  Location: [facility] CATH LAB;  Service: Cardiovascular;  Laterality: N/A;   PERCUTANEOUS CORONARY STENT INTERVENTION (PCI-S)  02/15/2013   Procedure: PERCUTANEOUS CORONARY STENT INTERVENTION (PCI-S);  Surgeon: Sherren Mocha, MD;  Location: [facility] CATH LAB;  Service: Cardiovascular;;    Social History: Social History   Tobacco Use   Smoking status: Never   Smokeless tobacco: Never  Vaping Use   Vaping Use: Never used  Substance Use Topics   Alcohol use: No    Comment: " quit along time ago"   Drug use: No     ROS: Negative for fevers, chills. Positive for headaches. All other systems reviewed and negative unless stated otherwise in HPI.   Physical Exam:    Vital Signs: BP 124/79   Pulse 85   Ht '5\' 9"'$  (1.753 m)   Wt 232 lb (105.2 kg)   BMI 34.26 kg/m  GENERAL: well appearing,in no acute distress,alert SKIN:  Color, texture, turgor normal. No rashes or lesions HEAD:  Normocephalic/atraumatic. CV:  RRR RESP: Normal respiratory effort MSK: no tenderness to palpation over occiput, neck, or shoulders  NEUROLOGICAL: Mental Status: Alert, oriented to person, place and time,Follows commands Cranial Nerves: PERRL, visual fields intact to confrontation, extraocular movements intact, facial sensation intact, no facial droop or ptosis, hearing grossly intact, no dysarthria Motor: muscle strength 5/5 both upper and lower extremities Reflexes: 2+ throughout Sensation: intact to light touch all 4 extremities Coordination: Finger-to- nose-finger intact bilaterally Gait: normal-based   IMPRESSION: 71 year old male with a history of kidney stones, bladder cancer, macular degeneration, CAD s/p stent, HTN, HLD, prediabetes, OSA who presents for evaluation of exertional headaches. MRI and MRA were unremarkable. Will order CTV to assess for venous thrombosis given positional component of his headache. If this is normal, suspect he is having primary cough headaches. Unfortunately he cannot take indomethacin due to history of CAD. Discussed increasing metoprolol, however he already has lightheadedness at his current dose. Will start gabapentin for headache prevention.  PLAN: -CT venogram -Prevention: Start gabapentin 300 mg QHS x1 week, then 300 mg BID x1 week, then 300 mg TID -Rescue: Tylenol PRN  I spent a total of 28 minutes chart reviewing and counseling the patient. Headache education was done. Discussed treatment options including preventive and acute medications. Discussed medication side effects, adverse reactions and drug interactions. Written educational materials and patient instructions outlining all of the above were given.  Follow-up: 3  months   Genia Harold, MD 07/27/2022   1:31 PM

## 2022-07-28 ENCOUNTER — Telehealth: Payer: Self-pay | Admitting: Psychiatry

## 2022-07-28 NOTE — Telephone Encounter (Signed)
[place] adv NPR sent to GI

## 2022-08-03 ENCOUNTER — Ambulatory Visit
Admission: RE | Admit: 2022-08-03 | Discharge: 2022-08-03 | Disposition: A | Discharge status: Home / Self Care | Payer: PPO | Source: Ambulatory Visit | Attending: Psychiatry | Admitting: Psychiatry

## 2022-08-03 DIAGNOSIS — H353221 Exudative age-related macular degeneration, left eye, with active choroidal neovascularization: Secondary | ICD-10-CM | POA: Diagnosis not present

## 2022-08-03 DIAGNOSIS — R519 Headache, unspecified: Secondary | ICD-10-CM | POA: Diagnosis not present

## 2022-08-03 DIAGNOSIS — H35033 Hypertensive retinopathy, bilateral: Secondary | ICD-10-CM | POA: Diagnosis not present

## 2022-08-03 DIAGNOSIS — E119 Type 2 diabetes mellitus without complications: Secondary | ICD-10-CM | POA: Diagnosis not present

## 2022-08-03 DIAGNOSIS — I6523 Occlusion and stenosis of bilateral carotid arteries: Secondary | ICD-10-CM | POA: Diagnosis not present

## 2022-08-03 DIAGNOSIS — R51 Headache with orthostatic component, not elsewhere classified: Secondary | ICD-10-CM

## 2022-08-03 DIAGNOSIS — H43813 Vitreous degeneration, bilateral: Secondary | ICD-10-CM | POA: Diagnosis not present

## 2022-08-03 DIAGNOSIS — H353114 Nonexudative age-related macular degeneration, right eye, advanced atrophic with subfoveal involvement: Secondary | ICD-10-CM | POA: Diagnosis not present

## 2022-08-03 MED ORDER — IOPAMIDOL (ISOVUE-300) INJECTION 61%
75.0000 mL | Freq: Once | INTRAVENOUS | Status: AC | PRN
  Administered 2022-08-03: 75 mL via INTRAVENOUS

## 2022-08-04 ENCOUNTER — Telehealth: Payer: Self-pay

## 2022-08-04 NOTE — Telephone Encounter (Signed)
-----   Message from Genia Harold, MD sent at 08/04/2022  9:44 AM EDT ----- CT venogram is normal

## 2022-08-04 NOTE — Telephone Encounter (Signed)
Called pt and discussed results with his wife Per DPR. She understood and appreciated the call.

## 2022-08-26 DIAGNOSIS — Q631 Lobulated, fused and horseshoe kidney: Secondary | ICD-10-CM | POA: Diagnosis not present

## 2022-08-26 DIAGNOSIS — I10 Essential (primary) hypertension: Secondary | ICD-10-CM | POA: Diagnosis not present

## 2022-08-26 DIAGNOSIS — E7849 Other hyperlipidemia: Secondary | ICD-10-CM | POA: Diagnosis not present

## 2022-08-26 DIAGNOSIS — R739 Hyperglycemia, unspecified: Secondary | ICD-10-CM | POA: Diagnosis not present

## 2022-08-31 DIAGNOSIS — I10 Essential (primary) hypertension: Secondary | ICD-10-CM | POA: Diagnosis not present

## 2022-08-31 DIAGNOSIS — G4733 Obstructive sleep apnea (adult) (pediatric): Secondary | ICD-10-CM | POA: Diagnosis not present

## 2022-08-31 DIAGNOSIS — Z23 Encounter for immunization: Secondary | ICD-10-CM | POA: Diagnosis not present

## 2022-08-31 DIAGNOSIS — C679 Malignant neoplasm of bladder, unspecified: Secondary | ICD-10-CM | POA: Diagnosis not present

## 2022-08-31 DIAGNOSIS — Z6834 Body mass index (BMI) 34.0-34.9, adult: Secondary | ICD-10-CM | POA: Diagnosis not present

## 2022-08-31 DIAGNOSIS — E7849 Other hyperlipidemia: Secondary | ICD-10-CM | POA: Diagnosis not present

## 2022-08-31 DIAGNOSIS — E1169 Type 2 diabetes mellitus with other specified complication: Secondary | ICD-10-CM | POA: Diagnosis not present

## 2022-08-31 DIAGNOSIS — I2510 Atherosclerotic heart disease of native coronary artery without angina pectoris: Secondary | ICD-10-CM | POA: Diagnosis not present

## 2022-08-31 DIAGNOSIS — R519 Headache, unspecified: Secondary | ICD-10-CM | POA: Diagnosis not present

## 2022-09-03 DIAGNOSIS — H353114 Nonexudative age-related macular degeneration, right eye, advanced atrophic with subfoveal involvement: Secondary | ICD-10-CM | POA: Diagnosis not present

## 2022-09-03 DIAGNOSIS — H43812 Vitreous degeneration, left eye: Secondary | ICD-10-CM | POA: Diagnosis not present

## 2022-09-03 DIAGNOSIS — H353221 Exudative age-related macular degeneration, left eye, with active choroidal neovascularization: Secondary | ICD-10-CM | POA: Diagnosis not present

## 2022-10-01 DIAGNOSIS — H353221 Exudative age-related macular degeneration, left eye, with active choroidal neovascularization: Secondary | ICD-10-CM | POA: Diagnosis not present

## 2022-10-06 DIAGNOSIS — R519 Headache, unspecified: Secondary | ICD-10-CM | POA: Diagnosis not present

## 2022-10-20 DIAGNOSIS — E1165 Type 2 diabetes mellitus with hyperglycemia: Secondary | ICD-10-CM | POA: Diagnosis not present

## 2022-10-20 DIAGNOSIS — E11319 Type 2 diabetes mellitus with unspecified diabetic retinopathy without macular edema: Secondary | ICD-10-CM | POA: Diagnosis not present

## 2022-10-20 DIAGNOSIS — E1142 Type 2 diabetes mellitus with diabetic polyneuropathy: Secondary | ICD-10-CM | POA: Diagnosis not present

## 2022-10-20 DIAGNOSIS — G8929 Other chronic pain: Secondary | ICD-10-CM | POA: Diagnosis not present

## 2022-10-20 DIAGNOSIS — E1169 Type 2 diabetes mellitus with other specified complication: Secondary | ICD-10-CM | POA: Diagnosis not present

## 2022-10-20 DIAGNOSIS — I10 Essential (primary) hypertension: Secondary | ICD-10-CM | POA: Diagnosis not present

## 2022-10-20 DIAGNOSIS — H3530 Unspecified macular degeneration: Secondary | ICD-10-CM | POA: Diagnosis not present

## 2022-10-20 DIAGNOSIS — C672 Malignant neoplasm of lateral wall of bladder: Secondary | ICD-10-CM | POA: Diagnosis not present

## 2022-10-20 DIAGNOSIS — E785 Hyperlipidemia, unspecified: Secondary | ICD-10-CM | POA: Diagnosis not present

## 2022-10-20 DIAGNOSIS — I25118 Atherosclerotic heart disease of native coronary artery with other forms of angina pectoris: Secondary | ICD-10-CM | POA: Diagnosis not present

## 2022-10-20 DIAGNOSIS — F330 Major depressive disorder, recurrent, mild: Secondary | ICD-10-CM | POA: Diagnosis not present

## 2022-10-29 DIAGNOSIS — H353221 Exudative age-related macular degeneration, left eye, with active choroidal neovascularization: Secondary | ICD-10-CM | POA: Diagnosis not present

## 2022-10-29 DIAGNOSIS — H35033 Hypertensive retinopathy, bilateral: Secondary | ICD-10-CM | POA: Diagnosis not present

## 2022-10-29 DIAGNOSIS — H43813 Vitreous degeneration, bilateral: Secondary | ICD-10-CM | POA: Diagnosis not present

## 2022-10-29 DIAGNOSIS — H2513 Age-related nuclear cataract, bilateral: Secondary | ICD-10-CM | POA: Diagnosis not present

## 2022-10-29 DIAGNOSIS — H353113 Nonexudative age-related macular degeneration, right eye, advanced atrophic without subfoveal involvement: Secondary | ICD-10-CM | POA: Diagnosis not present

## 2022-11-02 NOTE — Progress Notes (Deleted)
Patient: Kyle Lambert Date of Birth: 02-Apr-1951  Reason for Visit: Follow up History from: Patient Primary Neurologist: [place]   ASSESSMENT AND PLAN 71 y.o. year old male    HISTORY OF PRESENT ILLNESS: Today 11/02/22  HISTORY  Dr. Billey Gosling 07/27/22 HPI:  Medical co-morbidities: kidney stones, bladder cancer, macular degeneration, CAD s/p stent, HTN, HLD, prediabetes, OSA   The patient presents for evaluation of headaches which began in March 2023. They are triggered by coughing, sneezing, and bending forward. Headaches are described as pressure his vertex. No phonophobia or nausea. He does have some photophobia at baseline due to macular degeneration. They last 10-15 minutes at a time and are improved with lying down. Takes Tylenol as needed which does help.    MRI/MRA done on 06/22/22 were unremarkable.   Headache History: Onset: March 2023 Triggers: coughing, sneezing, bending forward Aura: no Location: vertex Quality/Description: pressure Associated Symptoms:             Photophobia: yes at baseline             Phonophobia: no             Nausea: no Worse with activity?: yes Duration of headaches: 10-15 minutes   Headache days per month: 30 Headache free days per month: 0   Current Treatment: Abortive Tylenol   Preventative none   Prior Therapies                                 Tylenol Metoprolol 25 mg BID Lisinopril 20 mg daily Flexeril 10 mg PRN   LABS: 06/03/22: TSH, CMP unremarkable   IMAGING:  MRI/MRA brain 06/22/22: unremarkable  REVIEW OF SYSTEMS: Out of a complete 14 system review of symptoms, the patient complains only of the following symptoms, and all other reviewed systems are negative.  See HPI  ALLERGIES: No Known Allergies  HOME MEDICATIONS: Outpatient Medications Prior to Visit  Medication Sig Dispense Refill   acetaminophen (TYLENOL) 500 MG tablet Take 1,000 mg by mouth every 6 (six) hours as needed for mild pain, moderate pain or  headache.      amLODipine (NORVASC) 5 MG tablet TAKE 1 TABLET DAILY (Patient taking differently: Take 5 mg by mouth daily.) 90 tablet 0   aspirin EC 81 MG tablet Take 81 mg by mouth daily.     atorvastatin (LIPITOR) 40 MG tablet Take 40 mg by mouth at bedtime.      cephALEXin (KEFLEX) 500 MG capsule Take 1 capsule (500 mg total) by mouth 4 (four) times daily. 28 capsule 0   clopidogrel (PLAVIX) 75 MG tablet TAKE 1 TABLET (75 MG TOTAL) DAILY WITH BREAKFAST. (Patient taking differently: Take 75 mg by mouth daily.) 90 tablet 0   cyclobenzaprine (FLEXERIL) 10 MG tablet Take 10 mg by mouth 3 (three) times daily as needed for muscle spasms.      fesoterodine (TOVIAZ) 4 MG TB24 tablet Take 4 mg by mouth daily.     gabapentin (NEURONTIN) 300 MG capsule Take 1 pill at bedtime for one week, then increase to 1 pill twice a day for one week, then increase to 1 pill three times a day 90 capsule 6   isosorbide mononitrate (IMDUR) 120 MG 24 hr tablet Take 1 tablet (120 mg total) by mouth daily. 90 tablet 3   lisinopril (PRINIVIL,ZESTRIL) 20 MG tablet Take 20 mg daily by mouth.     metFORMIN (GLUCOPHAGE)  500 MG tablet Take 500 mg by mouth 2 (two) times daily.     metoprolol tartrate (LOPRESSOR) 25 MG tablet TAKE 1 TABLET TWICE A DAY (Patient taking differently: Take 25 mg by mouth in the morning and at bedtime.) 60 tablet 0   nitrofurantoin, macrocrystal-monohydrate, (MACROBID) 100 MG capsule Take 1 capsule (100 mg total) by mouth every 12 (twelve) hours. 14 capsule 0   nitroGLYCERIN (NITROSTAT) 0.4 MG SL tablet Place 1 tablet (0.4 mg total) under the tongue every 5 (five) minutes x 3 doses as needed for chest pain. 25 tablet 3   ondansetron (ZOFRAN ODT) 4 MG disintegrating tablet Take 1 tablet (4 mg total) by mouth every 8 (eight) hours as needed for nausea or vomiting. 6 tablet 0   oxyCODONE-acetaminophen (PERCOCET) 5-325 MG tablet Take 1 tablet by mouth every 4 (four) hours as needed for moderate pain or severe  pain. 30 tablet 0   pantoprazole (PROTONIX) 40 MG tablet TAKE 1 TABLET BY MOUTH ONCE DAILY --  NEEDS  APPOINTMENT  FOR  FURTHER  REFILLS 15 tablet 0   tamsulosin (FLOMAX) 0.4 MG CAPS capsule Take 1 capsule (0.4 mg total) by mouth in the morning and at bedtime. 180 capsule 3   tolterodine (DETROL LA) 2 MG 24 hr capsule Take 2 capsules (4 mg total) by mouth daily. 30 capsule 11   Facility-Administered Medications Prior to Visit  Medication Dose Route Frequency Provider Last Rate Last Admin   ciprofloxacin (CIPRO) tablet 500 mg  500 mg Oral Once McKenzie, Candee Furbish, MD       ciprofloxacin (CIPRO) tablet 500 mg  500 mg Oral Once Cleon Gustin, MD        PAST MEDICAL HISTORY: Past Medical History:  Diagnosis Date   Arthritis    Bladder cancer ([place]) 03/2020   CAD (coronary artery disease)    a. 03/2012 Cath: LAD 80, RCA 80, nl EF;  b. PCI of RCA w 3.0x41m Promus DES, FFR of LAD was nl @ 0.85 ->Med Rx.;  c. LHC 02/15/13: proximal LAD 40-50%, mid LAD 95%, small D1 95-99%, mid CFX 30-40%, proximal RCA stent patent with ostial 20-30% prior to the stent, ostial PDA 50%, EF 55-65%. PCI:  Promus DES to the mid LAD and POBA to the proximal D1    Headache    Hyperlipidemia    Hypertension    Kidney stone    Obesity    OSA (obstructive sleep apnea)    Umbilical hernia     PAST SURGICAL HISTORY: Past Surgical History:  Procedure Laterality Date   CORONARY ANGIOPLASTY WITH STENT PLACEMENT  04/24/12   DES-RCA   CORONARY ANGIOPLASTY WITH STENT PLACEMENT  02/15/13   Patent RCA stent, diffuse nonobstructive R PDA, LCx disease, 99% mid LAD stenosis s/p DES-mid LAD; LVEF 60%   CORONARY STENT PLACEMENT     CYSTOSCOPY W/ RETROGRADES  04/07/2020   Procedure: CYSTOSCOPY WITH LEFT RETROGRADE PYELOGRAM;  Surgeon: MCleon Gustin MD;  [facility];  Service: Urology;;   CYSTOSCOPY WITH BIOPSY  04/07/2020   Procedure: CYSTOSCOPY WITH BLADDER BIOPSY;  Surgeon: MCleon Gustin MD;  [facility];  Service: Urology;;   [place] 04/07/2020   Procedure: CConsuela MimesWITH FULGERATION;  Surgeon: MCleon Gustin MD;  Location: [facility];  Service: Urology;;   CYSTOSCOPY/URETEROSCOPY/HOLMIUM LASER/STENT PLACEMENT Left 04/07/2020   Procedure: CYSTOSCOPY/LEFT URETEROSCOPY/HOLMIUM LASER LITHOTRIPSY/LEFT URETERAL STENT PLACEMENT;  Surgeon: MCleon Gustin MD;  Location: [facility];  Service:  Urology;  Laterality: Left;   HERNIA REPAIR Left inguinal   LEFT HEART CATH AND CORONARY ANGIOGRAPHY N/A 01/31/2019   Procedure: LEFT HEART CATH AND CORONARY ANGIOGRAPHY;  Surgeon: Sherren Mocha, MD;  Location: [place] [facility] LAB;  Service: Cardiovascular;  Laterality: N/A;   LEFT HEART CATHETERIZATION WITH CORONARY ANGIOGRAM N/A 04/21/2012   Procedure: LEFT HEART CATHETERIZATION WITH CORONARY ANGIOGRAM;  Surgeon: Hillary Bow, MD;  Location: [facility] CATH LAB;  Service: Cardiovascular;  Laterality: N/A;   LEFT HEART CATHETERIZATION WITH CORONARY ANGIOGRAM N/A 02/15/2013   Procedure: LEFT HEART CATHETERIZATION WITH CORONARY ANGIOGRAM;  Surgeon: Sherren Mocha, MD;  Location: [facility] CATH LAB;  Service: Cardiovascular;  Laterality: N/A;   LITHOTRIPSY     PERCUTANEOUS CORONARY STENT INTERVENTION (PCI-S) N/A 04/24/2012   Procedure: PERCUTANEOUS CORONARY STENT INTERVENTION (PCI-S);  Surgeon: Sherren Mocha, MD;  Location: [facility] CATH LAB;  Service: Cardiovascular;  Laterality: N/A;   PERCUTANEOUS CORONARY STENT INTERVENTION (PCI-S)  02/15/2013   Procedure: PERCUTANEOUS CORONARY STENT INTERVENTION (PCI-S);  Surgeon: Sherren Mocha, MD;  Location: [facility] CATH LAB;  Service: Cardiovascular;;    FAMILY HISTORY: Family History  Problem Relation Age of Onset   Cancer Mother    Rheum arthritis Mother    Lung cancer Father    Stroke Brother     SOCIAL HISTORY: Social History   Socioeconomic History   Marital status: Married    Spouse name: [name]   Number of children: 1   Years of education: Not on file    Highest education level: Not on file  Occupational History   Not on file  Tobacco Use   Smoking status: Never   Smokeless tobacco: Never  Vaping Use   Vaping Use: Never used  Substance and Sexual Activity   Alcohol use: No    Comment: " quit along time ago"   Drug use: No   Sexual activity: Not Currently  Other Topics Concern   Not on file  Social History Narrative   Lives with wife, dgtr, 2 grandsons live with   Social Determinants of Health   Financial Resource Strain: Not on file  Food Insecurity: Not on file  Transportation Needs: Not on file  Physical Activity: Not on file  Stress: Not on file  Social Connections: Not on file  Intimate Partner Violence: Not on file    PHYSICAL EXAM  There were no vitals filed for this visit. There is no height or weight on file to calculate BMI.  Generalized: Well developed, in no acute distress  Neurological examination  Mentation: Alert oriented to time, place, history taking. Follows all commands speech and language fluent Cranial nerve II-XII: Pupils were equal round reactive to light. Extraocular movements were full, visual field were full on confrontational test. Facial sensation and strength were normal. Uvula tongue midline. Head turning and shoulder shrug  were normal and symmetric. Motor: The motor testing reveals 5 over 5 strength of all 4 extremities. Good symmetric motor tone is noted throughout.  Sensory: Sensory testing is intact to soft touch on all 4 extremities. No evidence of extinction is noted.  Coordination: Cerebellar testing reveals good finger-nose-finger and heel-to-shin bilaterally.  Gait and station: Gait is normal. Tandem gait is normal. Romberg is negative. No drift is seen.  Reflexes: Deep tendon reflexes are symmetric and normal bilaterally.   DIAGNOSTIC DATA (LABS, IMAGING, TESTING) - I reviewed patient records, labs, notes, testing and imaging myself where available.  Lab Results  Component  Value Date   WBC 5.7 04/19/2020  HGB 12.4 (L) 04/19/2020   HCT 37.1 (L) 04/19/2020   MCV 86.9 04/19/2020   PLT 323 04/19/2020      Component Value Date/Time   NA 134 (L) 04/19/2020 1051   NA 135 01/15/2019 1049   K 3.8 04/19/2020 1051   CL 98 04/19/2020 1051   CO2 24 04/19/2020 1051   GLUCOSE 146 (H) 04/19/2020 1051   BUN 13 04/19/2020 1051   BUN 11 01/15/2019 1049   CREATININE 0.78 04/19/2020 1051   CALCIUM 8.2 (L) 04/19/2020 1051   PROT 6.4 (L) 04/19/2020 1051   ALBUMIN 2.6 (L) 04/19/2020 1051   AST 52 (H) 04/19/2020 1051   ALT 70 (H) 04/19/2020 1051   ALKPHOS 111 04/19/2020 1051   BILITOT 0.8 04/19/2020 1051   GFRNONAA >60 04/19/2020 1051   GFRAA >60 04/19/2020 1051   No results found for: "CHOL", "HDL", "LDLCALC", "LDLDIRECT", "TRIG", "CHOLHDL" Lab Results  Component Value Date   HGBA1C 6.5 (H) 04/03/2020   No results found for: "VITAMINB12" No results found for: "TSH"  Butler Denmark, AGNP-C, DNP 11/02/2022, 8:19 PM [facility] [address] [place], [place] [address] [phone]

## 2022-11-03 ENCOUNTER — Encounter: Payer: Self-pay | Admitting: Neurology

## 2022-11-03 ENCOUNTER — Ambulatory Visit: Payer: PPO | Admitting: Neurology

## 2022-11-10 ENCOUNTER — Other Ambulatory Visit: Payer: PPO | Admitting: Urology

## 2022-12-07 ENCOUNTER — Ambulatory Visit: Payer: Medicare HMO | Admitting: Urology

## 2022-12-07 ENCOUNTER — Other Ambulatory Visit: Payer: Medicare HMO | Admitting: Urology

## 2022-12-07 VITALS — BP 101/67 | HR 83 | Wt 232.0 lb

## 2022-12-07 DIAGNOSIS — R350 Frequency of micturition: Secondary | ICD-10-CM | POA: Diagnosis not present

## 2022-12-07 DIAGNOSIS — N401 Enlarged prostate with lower urinary tract symptoms: Secondary | ICD-10-CM

## 2022-12-07 DIAGNOSIS — Z8551 Personal history of malignant neoplasm of bladder: Secondary | ICD-10-CM

## 2022-12-07 DIAGNOSIS — N138 Other obstructive and reflux uropathy: Secondary | ICD-10-CM

## 2022-12-07 DIAGNOSIS — C672 Malignant neoplasm of lateral wall of bladder: Secondary | ICD-10-CM

## 2022-12-07 DIAGNOSIS — N3000 Acute cystitis without hematuria: Secondary | ICD-10-CM

## 2022-12-07 DIAGNOSIS — N3941 Urge incontinence: Secondary | ICD-10-CM

## 2022-12-07 MED ORDER — NITROFURANTOIN MONOHYD MACRO 100 MG PO CAPS
100.0000 mg | ORAL_CAPSULE | Freq: Two times a day (BID) | ORAL | 0 refills | Status: DC
Start: 2022-12-07 — End: 2023-03-27

## 2022-12-07 MED ORDER — CIPROFLOXACIN HCL 500 MG PO TABS: 500.0000 mg | ORAL_TABLET | Freq: Once | ORAL | Status: DC

## 2022-12-07 NOTE — Progress Notes (Signed)
12/07/2022 3:56 PM   Kyle Lambert 20-Mar-1951 [id number]  Referring provider: Curlene Labrum, MD [place],  [place] [address]  Followup BPH    HPI: [name] is a 72yo here for followup for BPHa nd for new concern of UTI. He was originally scheduled for cystoscopy today for his history of bladder cancer. IPSS 13 QOL 5 on flomax and toviaz. He has urinary urgency and urge incontinence. He uses 2-3 pads per day. He has been having dysuria for the past 9 days. UA is concerning for infection   PMH: Past Medical History:  Diagnosis Date   Arthritis    Bladder cancer ([place]) 03/2020   CAD (coronary artery disease)    a. 03/2012 Cath: LAD 80, RCA 80, nl EF;  b. PCI of RCA w 3.0x19m Promus DES, FFR of LAD was nl @ 0.85 ->Med Rx.;  c. LHC 02/15/13: proximal LAD 40-50%, mid LAD 95%, small D1 95-99%, mid CFX 30-40%, proximal RCA stent patent with ostial 20-30% prior to the stent, ostial PDA 50%, EF 55-65%. PCI:  Promus DES to the mid LAD and POBA to the proximal D1    Headache    Hyperlipidemia    Hypertension    Kidney stone    Obesity    OSA (obstructive sleep apnea)    Umbilical hernia     Surgical History: Past Surgical History:  Procedure Laterality Date   CORONARY ANGIOPLASTY WITH STENT PLACEMENT  04/24/12   DES-RCA   CORONARY ANGIOPLASTY WITH STENT PLACEMENT  02/15/13   Patent RCA stent, diffuse nonobstructive R PDA, LCx disease, 99% mid LAD stenosis s/p DES-mid LAD; LVEF 60%   CORONARY STENT PLACEMENT     CYSTOSCOPY W/ RETROGRADES  04/07/2020   Procedure: CYSTOSCOPY WITH LEFT RETROGRADE PYELOGRAM;  Surgeon: MCleon Gustin MD;  Location: [facility];  Service: Urology;;   CYSTOSCOPY WITH BIOPSY  04/07/2020   Procedure: CYSTOSCOPY WITH BLADDER BIOPSY;  Surgeon: MCleon Gustin MD;  Location: [facility];  Service: Urology;;   CRiley 04/07/2020   Procedure: CConsuela MimesWITH FULGERATION;  Surgeon: MCleon Gustin MD;  Location: [facility];  Service:  Urology;;   CYSTOSCOPY/URETEROSCOPY/HOLMIUM LASER/STENT PLACEMENT Left 04/07/2020   Procedure: CYSTOSCOPY/LEFT URETEROSCOPY/HOLMIUM LASER LITHOTRIPSY/LEFT URETERAL STENT PLACEMENT;  Surgeon: MCleon Gustin MD;  Location: [facility];  Service: Urology;  Laterality: Left;   HERNIA REPAIR Left inguinal   LEFT HEART CATH AND CORONARY ANGIOGRAPHY N/A 01/31/2019   Procedure: LEFT HEART CATH AND CORONARY ANGIOGRAPHY;  Surgeon: CSherren Mocha MD;  [facility];  Service: Cardiovascular;  Laterality: N/A;   LEFT HEART CATHETERIZATION WITH CORONARY ANGIOGRAM N/A 04/21/2012   Procedure: LEFT HEART CATHETERIZATION WITH CORONARY ANGIOGRAM;  Surgeon: THillary Bow MD;  [facility];  Service: Cardiovascular;  Laterality: N/A;   LEFT HEART CATHETERIZATION WITH CORONARY ANGIOGRAM N/A 02/15/2013   Procedure: LEFT HEART CATHETERIZATION WITH CORONARY ANGIOGRAM;  Surgeon: MSherren Mocha MD;  [facility];  Service: Cardiovascular;  Laterality: N/A;   LITHOTRIPSY     PERCUTANEOUS CORONARY STENT INTERVENTION (PCI-S) N/A 04/24/2012   Procedure: PERCUTANEOUS CORONARY STENT INTERVENTION (PCI-S);  Surgeon: MSherren Mocha MD;  [facility];  Service: Cardiovascular;  Laterality: N/A;   PERCUTANEOUS CORONARY STENT INTERVENTION (PCI-S)  02/15/2013   Procedure: PERCUTANEOUS CORONARY STENT INTERVENTION (PCI-S);  Surgeon: MSherren Mocha MD;  [facility];  Service: Cardiovascular;;    Home Medications:  Allergies as of 12/07/2022  No Known Allergies      Medication List        Accurate as of December 07, 2022  3:56 PM. If you have any questions, ask your nurse or doctor.          STOP taking these medications    cephALEXin 500 MG capsule Commonly known as: KEFLEX Stopped by: Nicolette Bang, MD   nitrofurantoin (macrocrystal-monohydrate) 100 MG capsule Commonly known as: MACROBID Stopped by: Nicolette Bang, MD       TAKE these medications     acetaminophen 500 MG tablet Commonly known as: TYLENOL Take 1,000 mg by mouth every 6 (six) hours as needed for mild pain, moderate pain or headache.   amLODipine 5 MG tablet Commonly known as: NORVASC TAKE 1 TABLET DAILY   aspirin EC 81 MG tablet Take 81 mg by mouth daily.   atorvastatin 40 MG tablet Commonly known as: LIPITOR Take 40 mg by mouth at bedtime.   clopidogrel 75 MG tablet Commonly known as: PLAVIX TAKE 1 TABLET (75 MG TOTAL) DAILY WITH BREAKFAST. What changed: See the new instructions.   cyclobenzaprine 10 MG tablet Commonly known as: FLEXERIL Take 10 mg by mouth 3 (three) times daily as needed for muscle spasms.   fesoterodine 4 MG Tb24 tablet Commonly known as: TOVIAZ Take 4 mg by mouth daily.   gabapentin 300 MG capsule Commonly known as: Neurontin Take 1 pill at bedtime for one week, then increase to 1 pill twice a day for one week, then increase to 1 pill three times a day   isosorbide mononitrate 120 MG 24 hr tablet Commonly known as: Imdur Take 1 tablet (120 mg total) by mouth daily.   lisinopril 20 MG tablet Commonly known as: ZESTRIL Take 20 mg daily by mouth.   metFORMIN 500 MG tablet Commonly known as: GLUCOPHAGE Take 500 mg by mouth 2 (two) times daily.   metoprolol tartrate 25 MG tablet Commonly known as: LOPRESSOR TAKE 1 TABLET TWICE A DAY What changed: when to take this   nitroGLYCERIN 0.4 MG SL tablet Commonly known as: NITROSTAT Place 1 tablet (0.4 mg total) under the tongue every 5 (five) minutes x 3 doses as needed for chest pain.   ondansetron 4 MG disintegrating tablet Commonly known as: Zofran ODT Take 1 tablet (4 mg total) by mouth every 8 (eight) hours as needed for nausea or vomiting.   oxyCODONE-acetaminophen 5-325 MG tablet Commonly known as: Percocet Take 1 tablet by mouth every 4 (four) hours as needed for moderate pain or severe pain.   pantoprazole 40 MG tablet Commonly known as: PROTONIX TAKE 1 TABLET BY  MOUTH ONCE DAILY --  NEEDS  APPOINTMENT  FOR  FURTHER  REFILLS   tamsulosin 0.4 MG Caps capsule Commonly known as: FLOMAX Take 1 capsule (0.4 mg total) by mouth in the morning and at bedtime.   tolterodine 2 MG 24 hr capsule Commonly known as: DETROL LA Take 2 capsules (4 mg total) by mouth daily.        Allergies: No Known Allergies  Family History: Family History  Problem Relation Age of Onset   Cancer Mother    Rheum arthritis Mother    Lung cancer Father    Stroke Brother     Social History:  reports that he has never smoked. He has never used smokeless tobacco. He reports that he does not drink alcohol and does not use drugs.  ROS: All other review of systems were reviewed and are negative  except what is noted above in HPI  Physical Exam: BP 101/67   Pulse 83   Wt 232 lb (105.2 kg)   BMI 34.26 kg/m   Constitutional:  Alert and oriented, No acute distress. HEENT: [place] AT, moist mucus membranes.  Trachea midline, no masses. Cardiovascular: No clubbing, cyanosis, or edema. Respiratory: Normal respiratory effort, no increased work of breathing. GI: Abdomen is soft, nontender, nondistended, no abdominal masses GU: No CVA tenderness.  Lymph: No cervical or inguinal lymphadenopathy. Skin: No rashes, bruises or suspicious lesions. Neurologic: Grossly intact, no focal deficits, moving all 4 extremities. Psychiatric: Normal mood and affect.  Laboratory Data: Lab Results  Component Value Date   WBC 5.7 04/19/2020   HGB 12.4 (L) 04/19/2020   HCT 37.1 (L) 04/19/2020   MCV 86.9 04/19/2020   PLT 323 04/19/2020    Lab Results  Component Value Date   CREATININE 0.78 04/19/2020    No results found for: "PSA"  No results found for: "TESTOSTERONE"  Lab Results  Component Value Date   HGBA1C 6.5 (H) 04/03/2020    Urinalysis    Component Value Date/Time   COLORURINE AMBER (A) 04/19/2020 1109   APPEARANCEUR Cloudy (A) 05/21/2022 1203   LABSPEC 1.011 04/19/2020  1109   PHURINE 5.0 04/19/2020 1109   GLUCOSEU Negative 05/21/2022 1203   HGBUR MODERATE (A) 04/19/2020 1109   BILIRUBINUR Negative 05/21/2022 1203   [place] 04/19/2020 1109   PROTEINUR Negative 05/21/2022 1203   PROTEINUR NEGATIVE 04/19/2020 1109   UROBILINOGEN 0.2 04/14/2020 1004   UROBILINOGEN 2.0 (H) 04/22/2012 1905   NITRITE Negative 05/21/2022 1203   NITRITE NEGATIVE 04/19/2020 1109   LEUKOCYTESUR Trace (A) 05/21/2022 1203   LEUKOCYTESUR SMALL (A) 04/19/2020 1109    Lab Results  Component Value Date   LABMICR See below: 05/21/2022   WBCUA 6-10 (A) 05/21/2022   LABEPIT None seen 05/21/2022   MUCUS Present 05/21/2022   BACTERIA Many (A) 05/21/2022    Pertinent Imaging:  Results for orders placed during the hospital encounter of 06/09/20  DG Abd 1 View  Narrative CLINICAL DATA:  Right-sided flank pain.  EXAM: ABDOMEN - 1 VIEW  COMPARISON:  June 29, 2007  FINDINGS: The bowel gas pattern is normal. Large amount of formed stool throughout the colon no radio-opaque calculi or other significant radiographic abnormality are seen.  IMPRESSION: 1. Nonobstructive bowel gas pattern. 2. Constipation. 3. No radiopaque renal calculi identified.   Electronically Signed By: Fidela Salisbury M.D. On: 06/10/2020 16:01  No results found for this or any previous visit.  No results found for this or any previous visit.  No results found for this or any previous visit.  Results for orders placed during the hospital encounter of 12/19/20  Ultrasound renal complete  Narrative CLINICAL DATA:  Nephrolithiasis, history bladder cancer  EXAM: RENAL / URINARY TRACT ULTRASOUND COMPLETE  COMPARISON:  06/09/2020  Correlation: CT abdomen and pelvis 02/01/2020  FINDINGS: Right Kidney:  Renal measurements: 15.0 x 9.2 x 7.6 cm = volume: 542 mL. Suboptimally visualized due to body habitus and horseshoe morphology. No mass or hydronephrosis. Nonshadowing  echogenic focus 5 mm diameter at mid kidney, nonspecific. No shadowing calculi visualized.  Left Kidney:  Renal measurements: 13.7 x 6.6 x 5.2 cm = volume: 244 mL. Suboptimally visualized due to body habitus and horseshoe morphology. Cortical thinning. Normal cortical echogenicity. No mass, hydronephrosis or shadowing calcification.  Bladder:  Appears normal for degree of bladder distention. BILATERAL ureteral jets visualized. Small posterior wall bladder  diverticulum.  Other:  None.  IMPRESSION: Horseshoe kidney without gross mass or hydronephrosis.  Small bladder diverticulum.   Electronically Signed By: Lavonia Dana M.D. On: 12/19/2020 18:17  No valid procedures specified. No results found for this or any previous visit.  Results for orders placed in visit on 05/12/22  CT RENAL STONE STUDY  Narrative CLINICAL DATA:  Nephrolithiasis, acute cystitis, history of kidney stones  EXAM: CT ABDOMEN AND PELVIS WITHOUT CONTRAST  TECHNIQUE: Multidetector CT imaging of the abdomen and pelvis was performed following the standard protocol without IV contrast.  RADIATION DOSE REDUCTION: This exam was performed according to the departmental dose-optimization program which includes automated exposure control, adjustment of the mA and/or kV according to patient size and/or use of iterative reconstruction technique.  COMPARISON:  02/01/2020  FINDINGS: Lower chest: No acute abnormality.  Coronary artery calcifications.  Hepatobiliary: No solid liver abnormality is seen. No gallstones, gallbladder wall thickening, or biliary dilatation.  Pancreas: Unremarkable. No pancreatic ductal dilatation or surrounding inflammatory changes.  Spleen: Normal in size without significant abnormality.  Adrenals/Urinary Tract: Adrenal glands are unremarkable. Horseshoe kidney. Punctuate nonobstructive calculus of the inferior pole moiety of the right kidney (series 5, image 56) with  faint medullary nephrocalcinosis elsewhere however without other discrete calculi identified. No ureteral calculi or hydronephrosis. Mild urinary bladder wall thickening. Small diverticulum of the posterior left aspect of the bladder dome (series 2, image 68).  Stomach/Bowel: Stomach is within normal limits. Appendix appears diminutive although normal. No evidence of bowel wall thickening, distention, or inflammatory changes.  Vascular/Lymphatic: Aortic atherosclerosis. No enlarged abdominal or pelvic lymph nodes.  Reproductive: No mass or other significant abnormality.  Other: No abdominal wall hernia or abnormality. No ascites.  Musculoskeletal: No acute or significant osseous findings.  IMPRESSION: 1. Horseshoe kidney. Punctuate nonobstructive calculus of the inferior pole moiety of the right kidney with faint medullary nephrocalcinosis elsewhere however without other discrete calculi identified. No ureteral calculi or hydronephrosis. 2. Mild urinary bladder wall thickening, consistent with nonspecific infectious or inflammatory cystitis. 3. Small diverticulum of the posterior left aspect of the bladder dome. 4. Coronary artery disease.  Aortic Atherosclerosis (ICD10-I70.0).   Electronically Signed By: Delanna Ahmadi M.D. On: 05/19/2022 16:16   Assessment & Plan:    1. Malignant neoplasm of lateral wall of urinary bladder (HCC) Followup 1 week for cystoscopy - Urinalysis, Routine w reflex microscopic  2. Acute cystitis without hematuria -urine for culture -macrobid '100mg'$  BID for 7 days   3. Benign prostatic hyperplasia with urinary obstruction -Continue flomax 0.'4mg'$   4. Urinary frequency -continue flomax 0.'4mg'$    No follow-ups on file.  Nicolette Bang, MD  [facility] [place]

## 2022-12-08 LAB — URINALYSIS, ROUTINE W REFLEX MICROSCOPIC
Bilirubin, UA: NEGATIVE
Glucose, UA: NEGATIVE
Nitrite, UA: POSITIVE — AB
Specific Gravity, UA: 1.025 (ref 1.005–1.030)
Urobilinogen, Ur: 2 mg/dL — ABNORMAL HIGH (ref 0.2–1.0)
pH, UA: 5.5 (ref 5.0–7.5)

## 2022-12-08 LAB — MICROSCOPIC EXAMINATION

## 2022-12-09 LAB — URINE CULTURE

## 2022-12-14 ENCOUNTER — Encounter: Payer: Self-pay | Admitting: Urology

## 2022-12-14 NOTE — Patient Instructions (Signed)
Urinary Tract Infection, Adult  A urinary tract infection (UTI) is an infection of any part of the urinary tract. The urinary tract includes the kidneys, ureters, bladder, and urethra. These organs make, store, and get rid of urine in the body. An upper UTI affects the ureters and kidneys. A lower UTI affects the bladder and urethra. What are the causes? Most urinary tract infections are caused by bacteria in your genital area around your urethra, where urine leaves your body. These bacteria grow and cause inflammation of your urinary tract. What increases the risk? You are more likely to develop this condition if: You have a urinary catheter that stays in place. You are not able to control when you urinate or have a bowel movement (incontinence). You are male and you: Use a spermicide or diaphragm for birth control. Have low estrogen levels. Are pregnant. You have certain genes that increase your risk. You are sexually active. You take antibiotic medicines. You have a condition that causes your flow of urine to slow down, such as: An enlarged prostate, if you are male. Blockage in your urethra. A kidney stone. A nerve condition that affects your bladder control (neurogenic bladder). Not getting enough to drink, or not urinating often. You have certain medical conditions, such as: Diabetes. A weak disease-fighting system (immunesystem). Sickle cell disease. Gout. Spinal cord injury. What are the signs or symptoms? Symptoms of this condition include: Needing to urinate right away (urgency). Frequent urination. This may include small amounts of urine each time you urinate. Pain or burning with urination. Blood in the urine. Urine that smells bad or unusual. Trouble urinating. Cloudy urine. Vaginal discharge, if you are male. Pain in the abdomen or the lower back. You may also have: Vomiting or a decreased appetite. Confusion. Irritability or tiredness. A fever or  chills. Diarrhea. The first symptom in older adults may be confusion. In some cases, they may not have any symptoms until the infection has worsened. How is this diagnosed? This condition is diagnosed based on your medical history and a physical exam. You may also have other tests, including: Urine tests. Blood tests. Tests for STIs (sexually transmitted infections). If you have had more than one UTI, a cystoscopy or imaging studies may be done to determine the cause of the infections. How is this treated? Treatment for this condition includes: Antibiotic medicine. Over-the-counter medicines to treat discomfort. Drinking enough water to stay hydrated. If you have frequent infections or have other conditions such as a kidney stone, you may need to see a health care provider who specializes in the urinary tract (urologist). In rare cases, urinary tract infections can cause sepsis. Sepsis is a life-threatening condition that occurs when the body responds to an infection. Sepsis is treated in the hospital with IV antibiotics, fluids, and other medicines. Follow these instructions at home:  Medicines Take over-the-counter and prescription medicines only as told by your health care provider. If you were prescribed an antibiotic medicine, take it as told by your health care provider. Do not stop using the antibiotic even if you start to feel better. General instructions Make sure you: Empty your bladder often and completely. Do not hold urine for long periods of time. Empty your bladder after sex. Wipe from front to back after urinating or having a bowel movement if you are male. Use each tissue only one time when you wipe. Drink enough fluid to keep your urine pale yellow. Keep all follow-up visits. This is important. Contact a health   care provider if: Your symptoms do not get better after 1-2 days. Your symptoms go away and then return. Get help right away if: You have severe pain in  your back or your lower abdomen. You have a fever or chills. You have nausea or vomiting. Summary A urinary tract infection (UTI) is an infection of any part of the urinary tract, which includes the kidneys, ureters, bladder, and urethra. Most urinary tract infections are caused by bacteria in your genital area. Treatment for this condition often includes antibiotic medicines. If you were prescribed an antibiotic medicine, take it as told by your health care provider. Do not stop using the antibiotic even if you start to feel better. Keep all follow-up visits. This is important. This information is not intended to replace advice given to you by your health care provider. Make sure you discuss any questions you have with your health care provider. Document Revised: 06/20/2020 Document Reviewed: 06/20/2020 Elsevier Patient Education  2023 [employer]  

## 2022-12-15 ENCOUNTER — Ambulatory Visit (INDEPENDENT_AMBULATORY_CARE_PROVIDER_SITE_OTHER): Payer: Medicare HMO | Admitting: Urology

## 2022-12-15 DIAGNOSIS — C672 Malignant neoplasm of lateral wall of bladder: Secondary | ICD-10-CM

## 2022-12-15 LAB — MICROSCOPIC EXAMINATION: Bacteria, UA: NONE SEEN

## 2022-12-15 LAB — URINALYSIS, ROUTINE W REFLEX MICROSCOPIC
Bilirubin, UA: NEGATIVE
Glucose, UA: NEGATIVE
Leukocytes,UA: NEGATIVE
Nitrite, UA: NEGATIVE
RBC, UA: NEGATIVE
Specific Gravity, UA: 1.025 (ref 1.005–1.030)
Urobilinogen, Ur: 2 mg/dL — ABNORMAL HIGH (ref 0.2–1.0)
pH, UA: 5 (ref 5.0–7.5)

## 2022-12-15 MED ORDER — CIPROFLOXACIN HCL 500 MG PO TABS
500.0000 mg | ORAL_TABLET | Freq: Once | ORAL | Status: AC
  Administered 2022-12-15: 500 mg via ORAL

## 2022-12-15 NOTE — Progress Notes (Signed)
   12/15/22  CC: followup bladder cancer   HPI: Kyle Lambert is a 72yo here for followup for bladder cancer There were no vitals taken for this visit. NED. A&Ox3.   No respiratory distress   Abd soft, NT, ND Normal phallus with bilateral descended testicles  Cystoscopy Procedure Note  Patient identification was confirmed, informed consent was obtained, and patient was prepped using Betadine solution.  Lidocaine jelly was administered per urethral meatus.     Pre-Procedure: - Inspection reveals a normal caliber ureteral meatus.  Procedure: The flexible cystoscope was introduced without difficulty - No urethral strictures/lesions are present. - Enlarged prostate  - Normal bladder neck - Bilateral ureteral orifices identified - Bladder mucosa  reveals no ulcers, tumors, or lesions - No bladder stones - No trabeculation   Post-Procedure: - Patient tolerated the procedure well  Assessment/ Plan: Followup 6 months for cystoscopy  No follow-ups on file.  Nicolette Bang, MD

## 2022-12-21 ENCOUNTER — Encounter: Payer: Self-pay | Admitting: Urology

## 2022-12-21 ENCOUNTER — Telehealth: Payer: Self-pay

## 2022-12-21 NOTE — Telephone Encounter (Signed)
Wife called stating patient passed a couple of blood clots this morning. He has not had trouble voiding or is not experiencing any other urinary symptoms.  Please advise.

## 2022-12-21 NOTE — Telephone Encounter (Signed)
Patient called and made aware.

## 2022-12-21 NOTE — Patient Instructions (Signed)

## 2022-12-24 NOTE — Telephone Encounter (Signed)
See other note

## 2023-02-07 DIAGNOSIS — H353113 Nonexudative age-related macular degeneration, right eye, advanced atrophic without subfoveal involvement: Secondary | ICD-10-CM | POA: Diagnosis not present

## 2023-02-07 DIAGNOSIS — H2513 Age-related nuclear cataract, bilateral: Secondary | ICD-10-CM | POA: Diagnosis not present

## 2023-02-07 DIAGNOSIS — H353221 Exudative age-related macular degeneration, left eye, with active choroidal neovascularization: Secondary | ICD-10-CM | POA: Diagnosis not present

## 2023-02-07 DIAGNOSIS — E119 Type 2 diabetes mellitus without complications: Secondary | ICD-10-CM | POA: Diagnosis not present

## 2023-02-07 DIAGNOSIS — H35033 Hypertensive retinopathy, bilateral: Secondary | ICD-10-CM | POA: Diagnosis not present

## 2023-02-07 DIAGNOSIS — H43813 Vitreous degeneration, bilateral: Secondary | ICD-10-CM | POA: Diagnosis not present

## 2023-02-16 DIAGNOSIS — C44319 Basal cell carcinoma of skin of other parts of face: Secondary | ICD-10-CM | POA: Diagnosis not present

## 2023-02-16 DIAGNOSIS — C44629 Squamous cell carcinoma of skin of left upper limb, including shoulder: Secondary | ICD-10-CM | POA: Diagnosis not present

## 2023-02-16 DIAGNOSIS — Z08 Encounter for follow-up examination after completed treatment for malignant neoplasm: Secondary | ICD-10-CM | POA: Diagnosis not present

## 2023-02-16 DIAGNOSIS — C44519 Basal cell carcinoma of skin of other part of trunk: Secondary | ICD-10-CM | POA: Diagnosis not present

## 2023-02-16 DIAGNOSIS — Z85828 Personal history of other malignant neoplasm of skin: Secondary | ICD-10-CM | POA: Diagnosis not present

## 2023-02-24 DIAGNOSIS — Z08 Encounter for follow-up examination after completed treatment for malignant neoplasm: Secondary | ICD-10-CM | POA: Diagnosis not present

## 2023-02-24 DIAGNOSIS — Z85828 Personal history of other malignant neoplasm of skin: Secondary | ICD-10-CM | POA: Diagnosis not present

## 2023-02-27 ENCOUNTER — Emergency Department (HOSPITAL_COMMUNITY)
Admission: EM | Admit: 2023-02-27 | Discharge: 2023-02-27 | Disposition: A | Discharge status: Home / Self Care | Payer: PPO | Attending: Emergency Medicine | Admitting: Emergency Medicine

## 2023-02-27 ENCOUNTER — Other Ambulatory Visit: Payer: Self-pay

## 2023-02-27 DIAGNOSIS — H579 Unspecified disorder of eye and adnexa: Secondary | ICD-10-CM | POA: Diagnosis not present

## 2023-02-27 DIAGNOSIS — S61402S Unspecified open wound of left hand, sequela: Secondary | ICD-10-CM

## 2023-02-27 DIAGNOSIS — W228XXA Striking against or struck by other objects, initial encounter: Secondary | ICD-10-CM | POA: Insufficient documentation

## 2023-02-27 DIAGNOSIS — Z85828 Personal history of other malignant neoplasm of skin: Secondary | ICD-10-CM | POA: Insufficient documentation

## 2023-02-27 DIAGNOSIS — S61402A Unspecified open wound of left hand, initial encounter: Secondary | ICD-10-CM | POA: Diagnosis not present

## 2023-02-27 DIAGNOSIS — Z7982 Long term (current) use of aspirin: Secondary | ICD-10-CM | POA: Insufficient documentation

## 2023-02-27 DIAGNOSIS — I2510 Atherosclerotic heart disease of native coronary artery without angina pectoris: Secondary | ICD-10-CM | POA: Diagnosis not present

## 2023-02-27 DIAGNOSIS — I10 Essential (primary) hypertension: Secondary | ICD-10-CM | POA: Diagnosis not present

## 2023-02-27 DIAGNOSIS — S61402D Unspecified open wound of left hand, subsequent encounter: Secondary | ICD-10-CM | POA: Diagnosis not present

## 2023-02-27 DIAGNOSIS — R58 Hemorrhage, not elsewhere classified: Secondary | ICD-10-CM | POA: Diagnosis not present

## 2023-02-27 NOTE — ED Triage Notes (Addendum)
Pt arrived [facility] for  a laceration to his left hand while attempting to catch himself during a fall. Hand is wrapped and bleeding is under control. Pt is on blood thinner.

## 2023-02-27 NOTE — Discharge Instructions (Signed)
Keep the area clean and dry.  Leave the Surgicel dressing in place and follow-up with your doctor who did the procedure.  Come back to the ER for new or worsening symptoms.

## 2023-02-27 NOTE — ED Provider Notes (Signed)
[place] [facility]MENT AT [facility] Provider Note   CSN: [id number] Arrival date & time: 02/27/23  0809     History  Chief Complaint  Patient presents with   Laceration    Kyle Lambert is a 72 y.o. male.  Manage CAD on Plavix presents for bleeding to left hand that started this morning shortly prior to arrival.  He had skin cancer removed from that hand 11 days ago and has been doing well but thinks he bumped it on the bathroom door when walking out today and started having bleeding described as blood "squirting out".  Bleeding controlled with pressure but his wife states they lost the phone number for the dermatologist so came to the ED for evaluation.  He denies fall or other injuries.   Laceration      Home Medications Prior to Admission medications   Medication Sig Start Date End Date Taking? Authorizing Provider  acetaminophen (TYLENOL) 500 MG tablet Take 1,000 mg by mouth every 6 (six) hours as needed for mild pain, moderate pain or headache.     [provider]  amLODipine (NORVASC) 5 MG tablet TAKE 1 TABLET DAILY Patient taking differently: Take 5 mg by mouth daily. 12/25/13   Tonny Bollmanooper, Michael, MD  aspirin EC 81 MG tablet Take 81 mg by mouth daily.    [provider]  atorvastatin (LIPITOR) 40 MG tablet Take 40 mg by mouth at bedtime.     [provider]  clopidogrel (PLAVIX) 75 MG tablet TAKE 1 TABLET (75 MG TOTAL) DAILY WITH BREAKFAST. Patient taking differently: Take 75 mg by mouth daily. 12/25/13   Tonny Bollmanooper, Michael, MD  cyclobenzaprine (FLEXERIL) 10 MG tablet Take 10 mg by mouth 3 (three) times daily as needed for muscle spasms.  10/11/18   [provider]  fesoterodine (TOVIAZ) 4 MG TB24 tablet Take 4 mg by mouth daily. 05/05/22   [provider]  gabapentin (NEURONTIN) 300 MG capsule Take 1 pill at bedtime for one week, then increase to 1 pill twice a day for one week, then increase to 1 pill three times a day  07/27/22   Ocie Doynehima, Jennifer, MD  isosorbide mononitrate (IMDUR) 120 MG 24 hr tablet Take 1 tablet (120 mg total) by mouth daily. 08/24/17   Tereso NewcomerWeaver, Scott T, PA-C  lisinopril (PRINIVIL,ZESTRIL) 20 MG tablet Take 20 mg daily by mouth.    [provider]  metFORMIN (GLUCOPHAGE) 500 MG tablet Take 500 mg by mouth 2 (two) times daily. 01/22/20   [provider]  metoprolol tartrate (LOPRESSOR) 25 MG tablet TAKE 1 TABLET TWICE A DAY Patient taking differently: Take 25 mg by mouth in the morning and at bedtime.    Tonny Bollmanooper, Michael, MD  nitrofurantoin, macrocrystal-monohydrate, (MACROBID) 100 MG capsule Take 1 capsule (100 mg total) by mouth every 12 (twelve) hours. 12/07/22   McKenzie, Mardene CelestePatrick L, MD  nitroGLYCERIN (NITROSTAT) 0.4 MG SL tablet Place 1 tablet (0.4 mg total) under the tongue every 5 (five) minutes x 3 doses as needed for chest pain. 04/25/12   Creig HinesBerge, Christopher Ronald, NP  ondansetron (ZOFRAN ODT) 4 MG disintegrating tablet Take 1 tablet (4 mg total) by mouth every 8 (eight) hours as needed for nausea or vomiting. 03/25/20   Maxwell CaulLayden, Lindsey A, PA-C  oxyCODONE-acetaminophen (PERCOCET) 5-325 MG tablet Take 1 tablet by mouth every 4 (four) hours as needed for moderate pain or severe pain. 05/22/20   McKenzie, Mardene CelestePatrick L, MD  pantoprazole (PROTONIX) 40 MG tablet TAKE  1 TABLET BY MOUTH ONCE DAILY --  NEEDS  APPOINTMENT  FOR  FURTHER  REFILLS 01/01/21   Tonny Bollman, MD  tamsulosin [facility]) 0.4 MG CAPS capsule Take 1 capsule (0.4 mg total) by mouth in the morning and at bedtime. 05/21/22   McKenzie, Mardene Madeline Pho, MD  tolterodine (DETROL LA) 2 MG 24 hr capsule Take 2 capsules (4 mg total) by mouth daily. 12/24/20   McKenzie, Mardene Maui Ahart, MD      Allergies    Patient has no known allergies.    Review of Systems   Review of Systems  Physical Exam Updated Vital Signs BP (!) 168/85   Pulse 75   Temp 98.1 F (36.7 C) (Oral)   Resp 18   Ht 6' (1.829 m)   Wt 110.2 kg   SpO2 97%   BMI 32.96  kg/m  Physical Exam Vitals and nursing note reviewed.  Constitutional:      General: He is not in acute distress.    Appearance: He is well-developed.  HENT:     Head: Normocephalic and atraumatic.     Mouth/Throat:     Mouth: Mucous membranes are moist.  Eyes:     Conjunctiva/sclera: Conjunctivae normal.  Cardiovascular:     Rate and Rhythm: Normal rate and regular rhythm.     Heart sounds: No murmur heard. Pulmonary:     Effort: Pulmonary effort is normal. No respiratory distress.     Breath sounds: Normal breath sounds.  Abdominal:     Palpations: Abdomen is soft.     Tenderness: There is no abdominal tenderness.  Musculoskeletal:        General: No swelling.     Cervical back: Neck supple.  Skin:    General: Skin is warm and dry.     Capillary Refill: Capillary refill takes less than 2 seconds.     Findings: No erythema or signs of injury.     Comments: Approximately 1.5 cm circular area of healing wound with mild bleeding on dorsum of the left hand  Neurological:     General: No focal deficit present.     Mental Status: He is alert and oriented to person, place, and time.  Psychiatric:        Mood and Affect: Mood normal.     ED Results / Procedures / Treatments   Labs (all labs ordered are listed, but only abnormal results are displayed) Labs Reviewed - No data to display  EKG None  Radiology No results found.  Procedures Procedures    Medications Ordered in ED Medications - No data to display  ED Course/ Medical Decision Making/ A&P                             Medical Decision Making Differential diagnosis: Laceration, bleeding wound, cellulitis, other ED course: Patient had surgery to remove skin cancer on the dorsum of the left hand 11 days ago has been doing well but today thinks he bumped it on something though he is not certain and started having large amount of bleeding.  Bleeding is controlled on my initial exam but moderate oozing noted  when I removed his bandage.  Area was cleansed with water and dressed with Surgicel and gauze with resolution of the bleeding.  Patient was observed to ensure bleeding had stopped.  Had no other complaints today.  They are going to call his surgeon tomorrow for follow-up and they have wound  care supplies at home.  They were advised to leave the Surgicel intact till they see his doctor           Final Clinical Impression(s) / ED Diagnoses Final diagnoses:  Open wound of left hand without foreign body, unspecified wound type, sequela    Rx / DC Orders ED Discharge Orders     None         Ma Rings, PA-C 02/27/23 0925    Pricilla Loveless, MD 02/28/23 [phone]

## 2023-03-11 DIAGNOSIS — I959 Hypotension, unspecified: Secondary | ICD-10-CM | POA: Diagnosis not present

## 2023-03-11 DIAGNOSIS — R946 Abnormal results of thyroid function studies: Secondary | ICD-10-CM | POA: Diagnosis not present

## 2023-03-11 DIAGNOSIS — E1169 Type 2 diabetes mellitus with other specified complication: Secondary | ICD-10-CM | POA: Diagnosis not present

## 2023-03-11 DIAGNOSIS — E7849 Other hyperlipidemia: Secondary | ICD-10-CM | POA: Diagnosis not present

## 2023-03-18 DIAGNOSIS — E7849 Other hyperlipidemia: Secondary | ICD-10-CM | POA: Diagnosis not present

## 2023-03-18 DIAGNOSIS — I209 Angina pectoris, unspecified: Secondary | ICD-10-CM | POA: Diagnosis not present

## 2023-03-18 DIAGNOSIS — I10 Essential (primary) hypertension: Secondary | ICD-10-CM | POA: Diagnosis not present

## 2023-03-18 DIAGNOSIS — I2510 Atherosclerotic heart disease of native coronary artery without angina pectoris: Secondary | ICD-10-CM | POA: Diagnosis not present

## 2023-03-18 DIAGNOSIS — G4733 Obstructive sleep apnea (adult) (pediatric): Secondary | ICD-10-CM | POA: Diagnosis not present

## 2023-03-18 DIAGNOSIS — C679 Malignant neoplasm of bladder, unspecified: Secondary | ICD-10-CM | POA: Diagnosis not present

## 2023-03-18 DIAGNOSIS — E1169 Type 2 diabetes mellitus with other specified complication: Secondary | ICD-10-CM | POA: Diagnosis not present

## 2023-03-18 DIAGNOSIS — Z6835 Body mass index (BMI) 35.0-35.9, adult: Secondary | ICD-10-CM | POA: Diagnosis not present

## 2023-03-25 ENCOUNTER — Encounter (HOSPITAL_COMMUNITY): Payer: Self-pay

## 2023-03-25 ENCOUNTER — Inpatient Hospital Stay (HOSPITAL_COMMUNITY)
Admission: EM | Admit: 2023-03-25 | Discharge: 2023-03-27 | DRG: 700 | Disposition: A | Discharge status: Home / Self Care | Payer: PPO | Attending: Student | Admitting: Student

## 2023-03-25 ENCOUNTER — Emergency Department (HOSPITAL_COMMUNITY): Payer: PPO

## 2023-03-25 ENCOUNTER — Other Ambulatory Visit: Payer: Self-pay

## 2023-03-25 DIAGNOSIS — Z7982 Long term (current) use of aspirin: Secondary | ICD-10-CM

## 2023-03-25 DIAGNOSIS — I10 Essential (primary) hypertension: Secondary | ICD-10-CM | POA: Diagnosis present

## 2023-03-25 DIAGNOSIS — G4733 Obstructive sleep apnea (adult) (pediatric): Secondary | ICD-10-CM | POA: Diagnosis present

## 2023-03-25 DIAGNOSIS — E119 Type 2 diabetes mellitus without complications: Secondary | ICD-10-CM

## 2023-03-25 DIAGNOSIS — E669 Obesity, unspecified: Secondary | ICD-10-CM | POA: Diagnosis present

## 2023-03-25 DIAGNOSIS — Z87440 Personal history of urinary (tract) infections: Secondary | ICD-10-CM | POA: Diagnosis not present

## 2023-03-25 DIAGNOSIS — I2510 Atherosclerotic heart disease of native coronary artery without angina pectoris: Secondary | ICD-10-CM | POA: Diagnosis present

## 2023-03-25 DIAGNOSIS — Z7902 Long term (current) use of antithrombotics/antiplatelets: Secondary | ICD-10-CM

## 2023-03-25 DIAGNOSIS — N138 Other obstructive and reflux uropathy: Secondary | ICD-10-CM | POA: Diagnosis present

## 2023-03-25 DIAGNOSIS — N320 Bladder-neck obstruction: Secondary | ICD-10-CM | POA: Diagnosis present

## 2023-03-25 DIAGNOSIS — E118 Type 2 diabetes mellitus with unspecified complications: Secondary | ICD-10-CM | POA: Diagnosis not present

## 2023-03-25 DIAGNOSIS — N3281 Overactive bladder: Secondary | ICD-10-CM | POA: Diagnosis not present

## 2023-03-25 DIAGNOSIS — Z6834 Body mass index (BMI) 34.0-34.9, adult: Secondary | ICD-10-CM

## 2023-03-25 DIAGNOSIS — Z955 Presence of coronary angioplasty implant and graft: Secondary | ICD-10-CM | POA: Diagnosis not present

## 2023-03-25 DIAGNOSIS — N200 Calculus of kidney: Secondary | ICD-10-CM | POA: Diagnosis present

## 2023-03-25 DIAGNOSIS — R319 Hematuria, unspecified: Principal | ICD-10-CM | POA: Diagnosis present

## 2023-03-25 DIAGNOSIS — N323 Diverticulum of bladder: Secondary | ICD-10-CM | POA: Diagnosis not present

## 2023-03-25 DIAGNOSIS — E782 Mixed hyperlipidemia: Secondary | ICD-10-CM | POA: Diagnosis not present

## 2023-03-25 DIAGNOSIS — Z87442 Personal history of urinary calculi: Secondary | ICD-10-CM | POA: Diagnosis not present

## 2023-03-25 DIAGNOSIS — Z79899 Other long term (current) drug therapy: Secondary | ICD-10-CM

## 2023-03-25 DIAGNOSIS — R310 Gross hematuria: Secondary | ICD-10-CM | POA: Diagnosis not present

## 2023-03-25 DIAGNOSIS — Z8551 Personal history of malignant neoplasm of bladder: Secondary | ICD-10-CM | POA: Diagnosis not present

## 2023-03-25 DIAGNOSIS — Z7984 Long term (current) use of oral hypoglycemic drugs: Secondary | ICD-10-CM

## 2023-03-25 DIAGNOSIS — N3289 Other specified disorders of bladder: Secondary | ICD-10-CM | POA: Diagnosis not present

## 2023-03-25 DIAGNOSIS — R338 Other retention of urine: Secondary | ICD-10-CM | POA: Diagnosis not present

## 2023-03-25 DIAGNOSIS — N401 Enlarged prostate with lower urinary tract symptoms: Secondary | ICD-10-CM | POA: Diagnosis not present

## 2023-03-25 DIAGNOSIS — K5730 Diverticulosis of large intestine without perforation or abscess without bleeding: Secondary | ICD-10-CM | POA: Diagnosis not present

## 2023-03-25 DIAGNOSIS — K219 Gastro-esophageal reflux disease without esophagitis: Secondary | ICD-10-CM | POA: Diagnosis present

## 2023-03-25 LAB — CBC WITH DIFFERENTIAL/PLATELET
Abs Immature Granulocytes: 0.02 10*3/uL (ref 0.00–0.07)
Basophils Absolute: 0.1 10*3/uL (ref 0.0–0.1)
Basophils Relative: 1 %
Eosinophils Absolute: 0.3 10*3/uL (ref 0.0–0.5)
Eosinophils Relative: 3 %
HCT: 33.1 % — ABNORMAL LOW (ref 39.0–52.0)
Hemoglobin: 10.5 g/dL — ABNORMAL LOW (ref 13.0–17.0)
Immature Granulocytes: 0 %
Lymphocytes Relative: 22 %
Lymphs Abs: 2.4 10*3/uL (ref 0.7–4.0)
MCH: 26.6 pg (ref 26.0–34.0)
MCHC: 31.7 g/dL (ref 30.0–36.0)
MCV: 83.8 fL (ref 80.0–100.0)
Monocytes Absolute: 1.1 10*3/uL — ABNORMAL HIGH (ref 0.1–1.0)
Monocytes Relative: 10 %
Neutro Abs: 6.7 10*3/uL (ref 1.7–7.7)
Neutrophils Relative %: 64 %
Platelets: 249 10*3/uL (ref 150–400)
RBC: 3.95 MIL/uL — ABNORMAL LOW (ref 4.22–5.81)
RDW: 15.2 % (ref 11.5–15.5)
WBC: 10.5 10*3/uL (ref 4.0–10.5)
nRBC: 0 % (ref 0.0–0.2)

## 2023-03-25 LAB — COMPREHENSIVE METABOLIC PANEL
ALT: 19 U/L (ref 0–44)
AST: 21 U/L (ref 15–41)
Albumin: 3.7 g/dL (ref 3.5–5.0)
Alkaline Phosphatase: 77 U/L (ref 38–126)
Anion gap: 8 (ref 5–15)
BUN: 12 mg/dL (ref 8–23)
CO2: 21 mmol/L — ABNORMAL LOW (ref 22–32)
Calcium: 8.2 mg/dL — ABNORMAL LOW (ref 8.9–10.3)
Chloride: 105 mmol/L (ref 98–111)
Creatinine, Ser: 0.72 mg/dL (ref 0.61–1.24)
GFR, Estimated: >60 mL/min (ref >60)
Glucose, Bld: 111 mg/dL — ABNORMAL HIGH (ref 70–99)
Potassium: 3.6 mmol/L (ref 3.5–5.1)
Sodium: 134 mmol/L — ABNORMAL LOW (ref 135–145)
Total Bilirubin: 0.5 mg/dL (ref 0.3–1.2)
Total Protein: 6.3 g/dL — ABNORMAL LOW (ref 6.5–8.1)

## 2023-03-25 NOTE — ED Triage Notes (Signed)
Pt presents with hematuria and dysuria that started today. States that he is passing clots. Endorses abdominal and left back pain.

## 2023-03-26 DIAGNOSIS — Z955 Presence of coronary angioplasty implant and graft: Secondary | ICD-10-CM | POA: Diagnosis not present

## 2023-03-26 DIAGNOSIS — R319 Hematuria, unspecified: Secondary | ICD-10-CM | POA: Diagnosis present

## 2023-03-26 DIAGNOSIS — E119 Type 2 diabetes mellitus without complications: Secondary | ICD-10-CM

## 2023-03-26 DIAGNOSIS — N3281 Overactive bladder: Secondary | ICD-10-CM | POA: Diagnosis present

## 2023-03-26 DIAGNOSIS — R310 Gross hematuria: Secondary | ICD-10-CM | POA: Diagnosis present

## 2023-03-26 DIAGNOSIS — E669 Obesity, unspecified: Secondary | ICD-10-CM | POA: Diagnosis present

## 2023-03-26 DIAGNOSIS — Z7982 Long term (current) use of aspirin: Secondary | ICD-10-CM | POA: Diagnosis not present

## 2023-03-26 DIAGNOSIS — G4733 Obstructive sleep apnea (adult) (pediatric): Secondary | ICD-10-CM | POA: Diagnosis present

## 2023-03-26 DIAGNOSIS — N200 Calculus of kidney: Secondary | ICD-10-CM | POA: Diagnosis present

## 2023-03-26 DIAGNOSIS — I2510 Atherosclerotic heart disease of native coronary artery without angina pectoris: Secondary | ICD-10-CM

## 2023-03-26 DIAGNOSIS — E118 Type 2 diabetes mellitus with unspecified complications: Secondary | ICD-10-CM

## 2023-03-26 DIAGNOSIS — Z7984 Long term (current) use of oral hypoglycemic drugs: Secondary | ICD-10-CM | POA: Diagnosis not present

## 2023-03-26 DIAGNOSIS — E782 Mixed hyperlipidemia: Secondary | ICD-10-CM

## 2023-03-26 DIAGNOSIS — N320 Bladder-neck obstruction: Secondary | ICD-10-CM

## 2023-03-26 DIAGNOSIS — Z87442 Personal history of urinary calculi: Secondary | ICD-10-CM | POA: Diagnosis not present

## 2023-03-26 DIAGNOSIS — K219 Gastro-esophageal reflux disease without esophagitis: Secondary | ICD-10-CM | POA: Diagnosis present

## 2023-03-26 DIAGNOSIS — I10 Essential (primary) hypertension: Secondary | ICD-10-CM

## 2023-03-26 DIAGNOSIS — Z6834 Body mass index (BMI) 34.0-34.9, adult: Secondary | ICD-10-CM | POA: Diagnosis not present

## 2023-03-26 DIAGNOSIS — N3289 Other specified disorders of bladder: Secondary | ICD-10-CM | POA: Diagnosis present

## 2023-03-26 DIAGNOSIS — Z79899 Other long term (current) drug therapy: Secondary | ICD-10-CM | POA: Diagnosis not present

## 2023-03-26 DIAGNOSIS — N401 Enlarged prostate with lower urinary tract symptoms: Secondary | ICD-10-CM | POA: Diagnosis present

## 2023-03-26 DIAGNOSIS — Z8551 Personal history of malignant neoplasm of bladder: Secondary | ICD-10-CM | POA: Diagnosis not present

## 2023-03-26 DIAGNOSIS — Z7902 Long term (current) use of antithrombotics/antiplatelets: Secondary | ICD-10-CM | POA: Diagnosis not present

## 2023-03-26 DIAGNOSIS — Z87440 Personal history of urinary (tract) infections: Secondary | ICD-10-CM | POA: Diagnosis not present

## 2023-03-26 LAB — URINALYSIS, ROUTINE W REFLEX MICROSCOPIC
Bacteria, UA: NONE SEEN
Bilirubin Urine: NEGATIVE
Glucose, UA: NEGATIVE mg/dL
Hgb urine dipstick: NEGATIVE
Ketones, ur: NEGATIVE mg/dL
Leukocytes,Ua: NEGATIVE
Nitrite: NEGATIVE
Protein, ur: NEGATIVE mg/dL
RBC / HPF: >50 RBC/hpf (ref 0–5)

## 2023-03-26 LAB — CBC
HCT: 32.6 % — ABNORMAL LOW (ref 39.0–52.0)
Hemoglobin: 10.4 g/dL — ABNORMAL LOW (ref 13.0–17.0)
MCH: 26.7 pg (ref 26.0–34.0)
MCHC: 31.9 g/dL (ref 30.0–36.0)
MCV: 83.6 fL (ref 80.0–100.0)
Platelets: 237 10*3/uL (ref 150–400)
RBC: 3.9 MIL/uL — ABNORMAL LOW (ref 4.22–5.81)
RDW: 15.1 % (ref 11.5–15.5)
WBC: 7.6 10*3/uL (ref 4.0–10.5)
nRBC: 0 % (ref 0.0–0.2)

## 2023-03-26 LAB — COMPREHENSIVE METABOLIC PANEL
ALT: 18 U/L (ref 0–44)
AST: 18 U/L (ref 15–41)
Albumin: 3.6 g/dL (ref 3.5–5.0)
Alkaline Phosphatase: 76 U/L (ref 38–126)
Anion gap: 7 (ref 5–15)
BUN: 11 mg/dL (ref 8–23)
CO2: 23 mmol/L (ref 22–32)
Calcium: 8.2 mg/dL — ABNORMAL LOW (ref 8.9–10.3)
Chloride: 104 mmol/L (ref 98–111)
Creatinine, Ser: 0.62 mg/dL (ref 0.61–1.24)
GFR, Estimated: >60 mL/min (ref >60)
Glucose, Bld: 109 mg/dL — ABNORMAL HIGH (ref 70–99)
Potassium: 3.7 mmol/L (ref 3.5–5.1)
Sodium: 134 mmol/L — ABNORMAL LOW (ref 135–145)
Total Bilirubin: 0.8 mg/dL (ref 0.3–1.2)
Total Protein: 6 g/dL — ABNORMAL LOW (ref 6.5–8.1)

## 2023-03-26 LAB — MAGNESIUM: Magnesium: 1.7 mg/dL (ref 1.7–2.4)

## 2023-03-26 LAB — GLUCOSE, CAPILLARY
Glucose-Capillary: 97 mg/dL (ref 70–99)
Glucose-Capillary: 136 mg/dL — ABNORMAL HIGH (ref 70–99)

## 2023-03-26 LAB — CBG MONITORING, ED
Glucose-Capillary: 115 mg/dL — ABNORMAL HIGH (ref 70–99)
Glucose-Capillary: 88 mg/dL (ref 70–99)

## 2023-03-26 LAB — HEMOGLOBIN A1C
Hgb A1c MFr Bld: 6.4 % — ABNORMAL HIGH (ref 4.8–5.6)
Mean Plasma Glucose: 136.98 mg/dL

## 2023-03-26 LAB — PHOSPHORUS: Phosphorus: 3.4 mg/dL (ref 2.5–4.6)

## 2023-03-26 MED ORDER — ACETAMINOPHEN 325 MG PO TABS
650.0000 mg | ORAL_TABLET | Freq: Four times a day (QID) | ORAL | Status: DC | PRN
  Administered 2023-03-26 (×2): 650 mg via ORAL
  Filled 2023-03-26 (×2): qty 2

## 2023-03-26 MED ORDER — ATORVASTATIN CALCIUM 20 MG PO TABS
40.0000 mg | ORAL_TABLET | Freq: Every day | ORAL | Status: DC
  Administered 2023-03-26: 40 mg via ORAL
  Filled 2023-03-26: qty 2

## 2023-03-26 MED ORDER — ASPIRIN 81 MG PO TBEC
81.0000 mg | DELAYED_RELEASE_TABLET | Freq: Every day | ORAL | Status: DC
  Administered 2023-03-26: 81 mg via ORAL
  Filled 2023-03-26: qty 1

## 2023-03-26 MED ORDER — FESOTERODINE FUMARATE ER 4 MG PO TB24
4.0000 mg | ORAL_TABLET | Freq: Every day | ORAL | Status: DC
  Administered 2023-03-26 – 2023-03-27 (×2): 4 mg via ORAL
  Filled 2023-03-26 (×2): qty 1

## 2023-03-26 MED ORDER — INSULIN ASPART 100 UNIT/ML IJ SOLN: 0.0000 [IU] | Freq: Every day | INTRAMUSCULAR | Status: DC

## 2023-03-26 MED ORDER — ISOSORBIDE MONONITRATE ER 60 MG PO TB24
120.0000 mg | ORAL_TABLET | Freq: Every day | ORAL | Status: DC
  Administered 2023-03-26 – 2023-03-27 (×2): 120 mg via ORAL
  Filled 2023-03-26 (×2): qty 2

## 2023-03-26 MED ORDER — PANTOPRAZOLE SODIUM 40 MG PO TBEC
40.0000 mg | DELAYED_RELEASE_TABLET | Freq: Every day | ORAL | Status: DC
  Administered 2023-03-26 – 2023-03-27 (×2): 40 mg via ORAL
  Filled 2023-03-26 (×2): qty 1

## 2023-03-26 MED ORDER — LIDOCAINE HCL URETHRAL/MUCOSAL 2 % EX GEL
1.0000 | Freq: Once | CUTANEOUS | Status: AC
  Administered 2023-03-26: 1 via URETHRAL
  Filled 2023-03-26: qty 10

## 2023-03-26 MED ORDER — ONDANSETRON HCL 4 MG PO TABS: 4.0000 mg | ORAL_TABLET | Freq: Four times a day (QID) | ORAL | Status: DC | PRN

## 2023-03-26 MED ORDER — METOPROLOL TARTRATE 25 MG PO TABS
25.0000 mg | ORAL_TABLET | Freq: Two times a day (BID) | ORAL | Status: DC
  Administered 2023-03-26 – 2023-03-27 (×3): 25 mg via ORAL
  Filled 2023-03-26 (×3): qty 1

## 2023-03-26 MED ORDER — SODIUM CHLORIDE 0.9 % IR SOLN
3000.0000 mL | Status: DC
  Administered 2023-03-26: 3000 mL

## 2023-03-26 MED ORDER — LISINOPRIL 20 MG PO TABS
20.0000 mg | ORAL_TABLET | Freq: Every day | ORAL | Status: DC
  Administered 2023-03-26: 20 mg via ORAL
  Filled 2023-03-26: qty 2

## 2023-03-26 MED ORDER — ACETAMINOPHEN 650 MG RE SUPP: 650.0000 mg | Freq: Four times a day (QID) | RECTAL | Status: DC | PRN

## 2023-03-26 MED ORDER — INSULIN ASPART 100 UNIT/ML IJ SOLN
0.0000 [IU] | INTRAMUSCULAR | Status: DC
  Filled 2023-03-26: qty 0.15

## 2023-03-26 MED ORDER — ONDANSETRON HCL 4 MG/2ML IJ SOLN: 4.0000 mg | Freq: Four times a day (QID) | INTRAMUSCULAR | Status: DC | PRN

## 2023-03-26 MED ORDER — ASPIRIN 81 MG PO TBEC: 81.0000 mg | DELAYED_RELEASE_TABLET | Freq: Every day | ORAL | Status: DC

## 2023-03-26 MED ORDER — TAMSULOSIN HCL 0.4 MG PO CAPS
0.4000 mg | ORAL_CAPSULE | Freq: Every day | ORAL | Status: DC
  Administered 2023-03-26: 0.4 mg via ORAL
  Filled 2023-03-26: qty 1

## 2023-03-26 MED ORDER — INSULIN ASPART 100 UNIT/ML IJ SOLN: 0.0000 [IU] | Freq: Three times a day (TID) | INTRAMUSCULAR | Status: DC

## 2023-03-26 NOTE — ED Provider Notes (Signed)
[place] EMERGENCY DEPARTMENT AT [facility] Provider Note   CSN: [id number] Arrival date & time: 03/25/23  2222     History  No chief complaint on file.   Kyle Lambert is a 72 y.o. male.  Hematuria started Friday morning. Continued. Has left flank pain as well. Had a couple weeks of dysuria recently but had been gone for the last couple days. Has h/o UTI's and kidney stones but this doesn't necessarily feel like those. Sees Dr. Ronne Binning with urology, has had cystoscopy previously. Not recently though. On plavix/asa, no anticoagulants.         Home Medications Prior to Admission medications   Medication Sig Start Date End Date Taking? Authorizing Provider  acetaminophen (TYLENOL) 500 MG tablet Take 1,000 mg by mouth every 6 (six) hours as needed for mild pain, moderate pain or headache.     [provider]  amLODipine (NORVASC) 5 MG tablet TAKE 1 TABLET DAILY Patient taking differently: Take 5 mg by mouth daily. 12/25/13   Tonny Bollman, MD  aspirin EC 81 MG tablet Take 81 mg by mouth daily.    [provider]  atorvastatin (LIPITOR) 40 MG tablet Take 40 mg by mouth at bedtime.     [provider]  clopidogrel (PLAVIX) 75 MG tablet TAKE 1 TABLET (75 MG TOTAL) DAILY WITH BREAKFAST. Patient taking differently: Take 75 mg by mouth daily. 12/25/13   Tonny Bollman, MD  cyclobenzaprine (FLEXERIL) 10 MG tablet Take 10 mg by mouth 3 (three) times daily as needed for muscle spasms.  10/11/18   [provider]  fesoterodine (TOVIAZ) 4 MG TB24 tablet Take 4 mg by mouth daily. 05/05/22   [provider]  gabapentin (NEURONTIN) 300 MG capsule Take 1 pill at bedtime for one week, then increase to 1 pill twice a day for one week, then increase to 1 pill three times a day 07/27/22   Ocie Doyne, MD  isosorbide mononitrate (IMDUR) 120 MG 24 hr tablet Take 1 tablet (120 mg total) by mouth daily. 08/24/17   Tereso Newcomer T, PA-C  lisinopril  (PRINIVIL,ZESTRIL) 20 MG tablet Take 20 mg daily by mouth.    [provider]  metFORMIN (GLUCOPHAGE) 500 MG tablet Take 500 mg by mouth 2 (two) times daily. 01/22/20   [provider]  metoprolol tartrate (LOPRESSOR) 25 MG tablet TAKE 1 TABLET TWICE A DAY Patient taking differently: Take 25 mg by mouth in the morning and at bedtime.    Tonny Bollman, MD  nitrofurantoin, macrocrystal-monohydrate, (MACROBID) 100 MG capsule Take 1 capsule (100 mg total) by mouth every 12 (twelve) hours. 12/07/22   McKenzie, Mardene Celeste, MD  nitroGLYCERIN (NITROSTAT) 0.4 MG SL tablet Place 1 tablet (0.4 mg total) under the tongue every 5 (five) minutes x 3 doses as needed for chest pain. 04/25/12   Creig Hines, NP  ondansetron (ZOFRAN ODT) 4 MG disintegrating tablet Take 1 tablet (4 mg total) by mouth every 8 (eight) hours as needed for nausea or vomiting. 03/25/20   Maxwell Caul, PA-C  oxyCODONE-acetaminophen (PERCOCET) 5-325 MG tablet Take 1 tablet by mouth every 4 (four) hours as needed for moderate pain or severe pain. 05/22/20   McKenzie, Mardene Celeste, MD  pantoprazole (PROTONIX) 40 MG tablet TAKE 1 TABLET BY MOUTH ONCE DAILY --  NEEDS  APPOINTMENT  FOR  FURTHER  REFILLS 01/01/21   Tonny Bollman, MD  tamsulosin [facility]) 0.4 MG CAPS capsule Take 1 capsule (0.4 mg total) by  mouth in the morning and at bedtime. 05/21/22   McKenzie, Mardene Celeste, MD  tolterodine (DETROL LA) 2 MG 24 hr capsule Take 2 capsules (4 mg total) by mouth daily. 12/24/20   McKenzie, Mardene Celeste, MD      Allergies    Patient has no known allergies.    Review of Systems   Review of Systems  Physical Exam Updated Vital Signs BP (!) 130/92   Pulse 72   Temp 98.1 F (36.7 C) (Oral)   Resp 17   Wt 111.1 kg   SpO2 96%   BMI 33.23 kg/m  Physical Exam Vitals and nursing note reviewed.  Constitutional:      Appearance: He is well-developed.  HENT:     Head: Normocephalic and atraumatic.  Eyes:     Pupils: Pupils are  equal, round, and reactive to light.  Cardiovascular:     Rate and Rhythm: Normal rate.  Pulmonary:     Effort: Pulmonary effort is normal. No respiratory distress.  Abdominal:     General: Abdomen is flat. There is no distension.  Musculoskeletal:        General: Normal range of motion.     Cervical back: Normal range of motion.  Skin:    General: Skin is warm and dry.  Neurological:     General: No focal deficit present.     Mental Status: He is alert.     ED Results / Procedures / Treatments   Labs (all labs ordered are listed, but only abnormal results are displayed) Labs Reviewed  URINALYSIS, ROUTINE W REFLEX MICROSCOPIC - Abnormal; Notable for the following components:      Result Value   Color, Urine RED (*)    APPearance HAZY (*)    All other components within normal limits  CBC WITH DIFFERENTIAL/PLATELET - Abnormal; Notable for the following components:   RBC 3.95 (*)    Hemoglobin 10.5 (*)    HCT 33.1 (*)    Monocytes Absolute 1.1 (*)    All other components within normal limits  COMPREHENSIVE METABOLIC PANEL - Abnormal; Notable for the following components:   Sodium 134 (*)    CO2 21 (*)    Glucose, Bld 111 (*)    Calcium 8.2 (*)    Total Protein 6.3 (*)    All other components within normal limits    EKG None  Radiology CT Renal Stone Study  Result Date: 03/25/2023 CLINICAL DATA:  Abdominal/flank pain, stone suspected EXAM: CT ABDOMEN AND PELVIS WITHOUT CONTRAST TECHNIQUE: Multidetector CT imaging of the abdomen and pelvis was performed following the standard protocol without IV contrast. RADIATION DOSE REDUCTION: This exam was performed according to the departmental dose-optimization program which includes automated exposure control, adjustment of the mA and/or kV according to patient size and/or use of iterative reconstruction technique. COMPARISON:  05/18/2022 FINDINGS: Lower chest: No acute abnormality Hepatobiliary: No focal hepatic abnormality.  Gallbladder unremarkable. Pancreas: No focal abnormality or ductal dilatation. Spleen: No focal abnormality.  Normal size. Adrenals/Urinary Tract: Horseshoe kidney. Punctate 1 mm stone in the lower pole of the right kidney. No ureteral stones or hydronephrosis. Multiple bladder wall diverticula are noted. There is high-density material within the bladder lumen and the posterior diverticula compatible with blood clot. Stomach/Bowel: Sigmoid diverticulosis. No active diverticulitis. Stomach and small bowel decompressed, unremarkable. Vascular/Lymphatic: Aortic atherosclerosis. No evidence of aneurysm or adenopathy. Reproductive: Prostate enlargement. Other: No free fluid or free air. Musculoskeletal: No acute bony abnormality. IMPRESSION: Horseshoe kidney. No ureteral  stones or hydronephrosis. Stable punctate right lower pole nephrolithiasis. Blood clot seen within the urinary bladder. Multiple posterior and left lateral bladder wall diverticula. Consider further evaluation with cystoscopy. Sigmoid diverticulosis.  No active diverticulitis. Aortic atherosclerosis. Electronically Signed   By: Charlett Nose M.D.   On: 03/25/2023 23:24    Procedures Procedures    Medications Ordered in ED Medications - No data to display  ED Course/ Medical Decision Making/ A&P                             Medical Decision Making Amount and/or Complexity of Data Reviewed Labs: ordered. Radiology: ordered.  Risk Decision regarding hospitalization.  No e/o UTI or kidney stone. Will irrigate bladder until improved, then have him follow up w/ urology.  Persistent hematuria even after 3L NaCl irrigation, will consult urology for further recommendations.   Discussed with Dr. Annabell Howells, recommends admission to hospitalist service at [facility] and they will see him in consultation.  Recommends holding Plavix for now.   Final Clinical Impression(s) / ED Diagnoses Final diagnoses:  None    Rx / DC Orders ED Discharge  Orders     None         Tyric Rodeheaver, Barbara Cower, MD 03/26/23 [phone]

## 2023-03-26 NOTE — ED Notes (Signed)
ED TO INPATIENT HANDOFF REPORT  ED Nurse Name and Phone #: Huntley Dec [phone]  S Name/Age/Gender Kyle Lambert 72 y.o. male Room/Bed: WA02/WA02  Code Status   Code Status: Full Code  Home/SNF/Other Patient oriented to: self, place, time, and situation Is this baseline? Yes   Triage Complete: Triage complete  Chief Complaint Hematuria [R31.9] Gross hematuria [R31.0]  Triage Note Pt presents with hematuria and dysuria that started today. States that he is passing clots. Endorses abdominal and left back pain.    Allergies No Known Allergies  Level of Care/Admitting Diagnosis ED Disposition     ED Disposition  Admit   Condition  --   Comment  Hospital Area: [facility] [[id number]]  Level of Care: Med-Surg [[id number]]  May admit patient to [facility] or [facility] if equivalent level of care is available:: No  Covid Evaluation: Asymptomatic - no recent exposure (last 10 days) testing not required  Diagnosis: Gross hematuria [599.71.ICD-9-CM]  Admitting Physician: Almon Hercules [[id number]]  Attending Physician: Almon Hercules [[id number]]  Certification:: I certify this patient will need inpatient services for at least 2 midnights          B Medical/Surgery History Past Medical History:  Diagnosis Date   Arthritis    Bladder cancer (HCC) 03/2020   CAD (coronary artery disease)    a. 03/2012 Cath: LAD 80, RCA 80, nl EF;  b. PCI of RCA w 3.0x23mm Promus DES, FFR of LAD was nl @ 0.85 ->Med Rx.;  c. LHC 02/15/13: proximal LAD 40-50%, mid LAD 95%, small D1 95-99%, mid CFX 30-40%, proximal RCA stent patent with ostial 20-30% prior to the stent, ostial PDA 50%, EF 55-65%. PCI:  Promus DES to the mid LAD and POBA to the proximal D1    Headache    Hyperlipidemia    Hypertension    Kidney stone    Obesity    OSA (obstructive sleep apnea)    Umbilical hernia    Past Surgical History:  Procedure Laterality Date   CORONARY ANGIOPLASTY WITH STENT PLACEMENT  04/24/12    DES-RCA   CORONARY ANGIOPLASTY WITH STENT PLACEMENT  02/15/13   Patent RCA stent, diffuse nonobstructive R PDA, LCx disease, 99% mid LAD stenosis s/p DES-mid LAD; LVEF 60%   CORONARY STENT PLACEMENT     CYSTOSCOPY W/ RETROGRADES  04/07/2020   Procedure: CYSTOSCOPY WITH LEFT RETROGRADE PYELOGRAM;  Surgeon: Malen Gauze, MD;  Location: [facility];  Service: Urology;;   CYSTOSCOPY WITH BIOPSY  04/07/2020   Procedure: CYSTOSCOPY WITH BLADDER BIOPSY;  Surgeon: Malen Gauze, MD;  Location: [facility];  Service: Urology;;   Bluford Kaufmann WITH FULGERATION  04/07/2020   Procedure: Bluford Kaufmann WITH FULGERATION;  Surgeon: Malen Gauze, MD;  Location: [facility];  Service: Urology;;   CYSTOSCOPY/URETEROSCOPY/HOLMIUM LASER/STENT PLACEMENT Left 04/07/2020   Procedure: CYSTOSCOPY/LEFT URETEROSCOPY/HOLMIUM LASER LITHOTRIPSY/LEFT URETERAL STENT PLACEMENT;  Surgeon: Malen Gauze, MD;  Location: [facility];  Service: Urology;  Laterality: Left;   HERNIA REPAIR Left inguinal   LEFT HEART CATH AND CORONARY ANGIOGRAPHY N/A 01/31/2019   Procedure: LEFT HEART CATH AND CORONARY ANGIOGRAPHY;  Surgeon: Tonny Bollman, MD;  Location: [facility] INVASIVE CV LAB;  Service: Cardiovascular;  Laterality: N/A;   LEFT HEART CATHETERIZATION WITH CORONARY ANGIOGRAM N/A 04/21/2012   Procedure: LEFT HEART CATHETERIZATION WITH CORONARY ANGIOGRAM;  Surgeon: Herby Abraham, MD;  Location: [facility] CATH LAB;  Service: Cardiovascular;  Laterality: N/A;   LEFT HEART CATHETERIZATION WITH CORONARY ANGIOGRAM N/A 02/15/2013  Procedure: LEFT HEART CATHETERIZATION WITH CORONARY ANGIOGRAM;  Surgeon: Tonny Bollman, MD;  Location: [facility] CATH LAB;  Service: Cardiovascular;  Laterality: N/A;   LITHOTRIPSY     PERCUTANEOUS CORONARY STENT INTERVENTION (PCI-S) N/A 04/24/2012   Procedure: PERCUTANEOUS CORONARY STENT INTERVENTION (PCI-S);  Surgeon: Tonny Bollman, MD;  Location: [facility] CATH LAB;  Service: Cardiovascular;  Laterality: N/A;   PERCUTANEOUS CORONARY STENT  INTERVENTION (PCI-S)  02/15/2013   Procedure: PERCUTANEOUS CORONARY STENT INTERVENTION (PCI-S);  Surgeon: Tonny Bollman, MD;  Location: [facility] CATH LAB;  Service: Cardiovascular;;     A IV Location/Drains/Wounds Patient Lines/Drains/Airways Status     Active Line/Drains/Airways     Name Placement date Placement time Site Days   Peripheral IV 03/26/23 20 G Anterior;Left Forearm 03/26/23  1130  Forearm  less than 1   Urethral Catheter Sheralyn Boatman RN Triple-lumen;Straight-tip 20 Fr. 03/26/23  0244  Triple-lumen;Straight-tip  less than 1   Ureteral Drain/Stent Left ureter 6 Fr. 04/07/20  1053  Left ureter  1083            Intake/Output Last 24 hours  Intake/Output Summary (Last 24 hours) at 03/26/2023 1258 Last data filed at 03/26/2023 0300 Gross per 24 hour  Intake 6000 ml  Output 3000 ml  Net 3000 ml    Labs/Imaging Results for orders placed or performed during the hospital encounter of 03/25/23 (from the past 48 hour(s))  Urinalysis, Routine w reflex microscopic -Urine, Clean Catch     Status: Abnormal   Collection Time: 03/25/23 10:55 PM  Result Value Ref Range   Color, Urine RED (A) YELLOW    Comment: BIOCHEMICALS MAY BE AFFECTED BY COLOR   APPearance HAZY (A) CLEAR   Specific Gravity, Urine  1.005 - 1.030    TEST NOT REPORTED DUE TO COLOR INTERFERENCE OF URINE PIGMENT   pH  5.0 - 8.0    TEST NOT REPORTED DUE TO COLOR INTERFERENCE OF URINE PIGMENT   Glucose, UA NEGATIVE NEGATIVE mg/dL   Hgb urine dipstick NEGATIVE NEGATIVE   Bilirubin Urine NEGATIVE NEGATIVE   Ketones, ur NEGATIVE NEGATIVE mg/dL   Protein, ur NEGATIVE NEGATIVE mg/dL   Nitrite NEGATIVE NEGATIVE   Leukocytes,Ua NEGATIVE NEGATIVE    Comment: Microscopic not done on urines with negative protein, blood, leukocytes, nitrite, or glucose < 500 mg/dL.   RBC / HPF >50 0 - 5 RBC/hpf   WBC, UA 6-10 0 - 5 WBC/hpf   Bacteria, UA NONE SEEN NONE SEEN   Squamous Epithelial / HPF 0-5 0 - 5 /HPF    Comment: Performed at [facility], [address], [place], [place] [address]  CBC with Differential     Status: Abnormal   Collection Time: 03/25/23 11:34 PM  Result Value Ref Range   WBC 10.5 4.0 - 10.5 K/uL   RBC 3.95 (L) 4.22 - 5.81 MIL/uL   Hemoglobin 10.5 (L) 13.0 - 17.0 g/dL   HCT 60.4 (L) 54.0 - 98.1 %   MCV 83.8 80.0 - 100.0 fL   MCH 26.6 26.0 - 34.0 pg   MCHC 31.7 30.0 - 36.0 g/dL   RDW 19.1 47.8 - 29.5 %   Platelets 249 150 - 400 K/uL   nRBC 0.0 0.0 - 0.2 %   Neutrophils Relative % 64 %   Neutro Abs 6.7 1.7 - 7.7 K/uL   Lymphocytes Relative 22 %   Lymphs Abs 2.4 0.7 - 4.0 K/uL   Monocytes Relative 10 %   Monocytes Absolute 1.1 (H) 0.1 - 1.0 K/uL  Eosinophils Relative 3 %   Eosinophils Absolute 0.3 0.0 - 0.5 K/uL   Basophils Relative 1 %   Basophils Absolute 0.1 0.0 - 0.1 K/uL   Immature Granulocytes 0 %   Abs Immature Granulocytes 0.02 0.00 - 0.07 K/uL    Comment: Performed at [facility], [address] [place], [place] [address]  Comprehensive metabolic panel     Status: Abnormal   Collection Time: 03/25/23 11:34 PM  Result Value Ref Range   Sodium 134 (L) 135 - 145 mmol/L   Potassium 3.6 3.5 - 5.1 mmol/L   Chloride 105 98 - 111 mmol/L   CO2 21 (L) 22 - 32 mmol/L   Glucose, Bld 111 (H) 70 - 99 mg/dL    Comment: Glucose reference range applies only to samples taken after fasting for at least 8 hours.   BUN 12 8 - 23 mg/dL   Creatinine, Ser 9.14 0.61 - 1.24 mg/dL   Calcium 8.2 (L) 8.9 - 10.3 mg/dL   Total Protein 6.3 (L) 6.5 - 8.1 g/dL   Albumin 3.7 3.5 - 5.0 g/dL   AST 21 15 - 41 U/L   ALT 19 0 - 44 U/L   Alkaline Phosphatase 77 38 - 126 U/L   Total Bilirubin 0.5 0.3 - 1.2 mg/dL   GFR, Estimated >78 >29 mL/min    Comment: (NOTE) Calculated using the CKD-EPI Creatinine Equation (2021)    Anion gap 8 5 - 15    Comment: Performed at [facility], [address] [place], [place] [address]  Comprehensive metabolic panel     Status: Abnormal   Collection Time: 03/26/23  6:12 AM   Result Value Ref Range   Sodium 134 (L) 135 - 145 mmol/L   Potassium 3.7 3.5 - 5.1 mmol/L   Chloride 104 98 - 111 mmol/L   CO2 23 22 - 32 mmol/L   Glucose, Bld 109 (H) 70 - 99 mg/dL    Comment: Glucose reference range applies only to samples taken after fasting for at least 8 hours.   BUN 11 8 - 23 mg/dL   Creatinine, Ser 0.86 0.61 - 1.24 mg/dL   Calcium 8.2 (L) 8.9 - 10.3 mg/dL   Total Protein 6.0 (L) 6.5 - 8.1 g/dL   Albumin 3.6 3.5 - 5.0 g/dL   AST 18 15 - 41 U/L   ALT 18 0 - 44 U/L   Alkaline Phosphatase 76 38 - 126 U/L   Total Bilirubin 0.8 0.3 - 1.2 mg/dL   GFR, Estimated >57 >84 mL/min    Comment: (NOTE) Calculated using the CKD-EPI Creatinine Equation (2021)    Anion gap 7 5 - 15    Comment: Performed at [facility], [address] [place], [place] [address]  CBC     Status: Abnormal   Collection Time: 03/26/23  6:12 AM  Result Value Ref Range   WBC 7.6 4.0 - 10.5 K/uL   RBC 3.90 (L) 4.22 - 5.81 MIL/uL   Hemoglobin 10.4 (L) 13.0 - 17.0 g/dL   HCT 52.8 (L) 41.3 - 24.4 %   MCV 83.6 80.0 - 100.0 fL   MCH 26.7 26.0 - 34.0 pg   MCHC 31.9 30.0 - 36.0 g/dL   RDW 01.0 27.2 - 53.6 %   Platelets 237 150 - 400 K/uL   nRBC 0.0 0.0 - 0.2 %    Comment: Performed at [facility], [address] [place], [place] [address]  Magnesium     Status: None  Collection Time: 03/26/23  6:12 AM  Result Value Ref Range   Magnesium 1.7 1.7 - 2.4 mg/dL    Comment: Performed at [facility], [address] [place], [place] [address]  Phosphorus     Status: None   Collection Time: 03/26/23  6:12 AM  Result Value Ref Range   Phosphorus 3.4 2.5 - 4.6 mg/dL    Comment: Performed at [facility], [address] [place], [place] [address]  Hemoglobin A1c     Status: Abnormal   Collection Time: 03/26/23  7:33 AM  Result Value Ref Range   Hgb A1c MFr Bld 6.4 (H) 4.8 - 5.6 %    Comment: (NOTE) Pre diabetes:          5.7%-6.4%  Diabetes:              >6.4%  Glycemic control for    <7.0% adults with diabetes    Mean Plasma Glucose 136.98 mg/dL    Comment: Performed at [facility], [address] [place], [place] [address]  CBG monitoring, ED     Status: Abnormal   Collection Time: 03/26/23  7:49 AM  Result Value Ref Range   Glucose-Capillary 115 (H) 70 - 99 mg/dL    Comment: Glucose reference range applies only to samples taken after fasting for at least 8 hours.  CBG monitoring, ED     Status: None   Collection Time: 03/26/23 11:54 AM  Result Value Ref Range   Glucose-Capillary 88 70 - 99 mg/dL    Comment: Glucose reference range applies only to samples taken after fasting for at least 8 hours.   CT Renal Stone Study  Result Date: 03/25/2023 CLINICAL DATA:  Abdominal/flank pain, stone suspected EXAM: CT ABDOMEN AND PELVIS WITHOUT CONTRAST TECHNIQUE: Multidetector CT imaging of the abdomen and pelvis was performed following the standard protocol without IV contrast. RADIATION DOSE REDUCTION: This exam was performed according to the departmental dose-optimization program which includes automated exposure control, adjustment of the mA and/or kV according to patient size and/or use of iterative reconstruction technique. COMPARISON:  05/18/2022 FINDINGS: Lower chest: No acute abnormality Hepatobiliary: No focal hepatic abnormality. Gallbladder unremarkable. Pancreas: No focal abnormality or ductal dilatation. Spleen: No focal abnormality.  Normal size. Adrenals/Urinary Tract: Horseshoe kidney. Punctate 1 mm stone in the lower pole of the right kidney. No ureteral stones or hydronephrosis. Multiple bladder wall diverticula are noted. There is high-density material within the bladder lumen and the posterior diverticula compatible with blood clot. Stomach/Bowel: Sigmoid diverticulosis. No active diverticulitis. Stomach and small bowel decompressed, unremarkable. Vascular/Lymphatic: Aortic atherosclerosis. No evidence of aneurysm or adenopathy. Reproductive: Prostate  enlargement. Other: No free fluid or free air. Musculoskeletal: No acute bony abnormality. IMPRESSION: Horseshoe kidney. No ureteral stones or hydronephrosis. Stable punctate right lower pole nephrolithiasis. Blood clot seen within the urinary bladder. Multiple posterior and left lateral bladder wall diverticula. Consider further evaluation with cystoscopy. Sigmoid diverticulosis.  No active diverticulitis. Aortic atherosclerosis. Electronically Signed   By: Charlett Nose M.D.   On: 03/25/2023 23:24    Pending Labs Unresulted Labs (From admission, onward)    None       Vitals/Pain Today's Vitals   03/26/23 0700 03/26/23 0900 03/26/23 1010 03/26/23 1015  BP: (!) 143/92 (!) 147/84 (!) 175/164 (!) 146/87  Pulse: 77 78 78 76  Resp:   18   Temp:   97.7 F (36.5 C)   TempSrc:   Oral   SpO2: 97% 95% 96% 95%  Weight:      PainSc:   5      Isolation Precautions No active isolations  Medications Medications  acetaminophen (TYLENOL) tablet 650 mg (650 mg Oral Given 03/26/23 0841)    Or  acetaminophen (TYLENOL) suppository 650 mg ( Rectal See Alternative 03/26/23 0841)  ondansetron (ZOFRAN) tablet 4 mg (has no administration in time range)    Or  ondansetron (ZOFRAN) injection 4 mg (has no administration in time range)  aspirin EC tablet 81 mg (81 mg Oral Given 03/26/23 1215)  atorvastatin (LIPITOR) tablet 40 mg (has no administration in time range)  isosorbide mononitrate (IMDUR) 24 hr tablet 120 mg (120 mg Oral Given 03/26/23 1247)  metoprolol tartrate (LOPRESSOR) tablet 25 mg (25 mg Oral Given 03/26/23 1019)  lisinopril (ZESTRIL) tablet 20 mg (20 mg Oral Given 03/26/23 1019)  pantoprazole (PROTONIX) EC tablet 40 mg (40 mg Oral Given 03/26/23 1019)  tamsulosin (FLOMAX) capsule 0.4 mg (has no administration in time range)  fesoterodine (TOVIAZ) tablet 4 mg (4 mg Oral Given 03/26/23 1214)  insulin aspart (novoLOG) injection 0-15 Units ( Subcutaneous Not Given 03/26/23 1247)  lidocaine (XYLOCAINE) 2 %  jelly 1 Application (1 Application Urethral Given 03/26/23 0243)    Mobility walks     Focused Assessments  R Recommendations: See Admitting Provider Note  Report given to:   Additional Notes:

## 2023-03-26 NOTE — Consult Note (Signed)
Subjective:    Consult requested by Dr. Candelaria Stagers.  Kyle Lambert is a 72 yo male who is followed by Dr. Arlyss Repress for a history of bladder cancer.  He had a negative cystoscopy in January.  He had the onset Friday morning of gross hematuria with clots and had some dysuria the day before.  He came to the ER with voiding difficulty and left flank pain and a CT showed clots in the bladder but only a punctate RLP stone in his horseshoe kidney.  There was no obstruction.  The bladder had diverticuli.  His UA had hematuria with some pyuria.  He had a Klebsiella UTI treated with macrobid on 12/07/22.  He had BPH with BOO and OAB and is on tamsulosin and toviaz for that.  HE has a history of CAD with stents and is no plavix.    ROS:  Review of Systems  Genitourinary:  Positive for dysuria, flank pain and hematuria.  All other systems reviewed and are negative.   No Known Allergies  Past Medical History:  Diagnosis Date   Arthritis    Bladder cancer (HCC) 03/2020   CAD (coronary artery disease)    a. 03/2012 Cath: LAD 80, RCA 80, nl EF;  b. PCI of RCA w 3.0x51mm Promus DES, FFR of LAD was nl @ 0.85 ->Med Rx.;  c. LHC 02/15/13: proximal LAD 40-50%, mid LAD 95%, small D1 95-99%, mid CFX 30-40%, proximal RCA stent patent with ostial 20-30% prior to the stent, ostial PDA 50%, EF 55-65%. PCI:  Promus DES to the mid LAD and POBA to the proximal D1    Headache    Hyperlipidemia    Hypertension    Kidney stone    Obesity    OSA (obstructive sleep apnea)    Umbilical hernia     Past Surgical History:  Procedure Laterality Date   CORONARY ANGIOPLASTY WITH STENT PLACEMENT  04/24/12   DES-RCA   CORONARY ANGIOPLASTY WITH STENT PLACEMENT  02/15/13   Patent RCA stent, diffuse nonobstructive R PDA, LCx disease, 99% mid LAD stenosis s/p DES-mid LAD; LVEF 60%   CORONARY STENT PLACEMENT     CYSTOSCOPY W/ RETROGRADES  04/07/2020   Procedure: CYSTOSCOPY WITH LEFT RETROGRADE PYELOGRAM;  Surgeon: Malen Gauze, MD;   Location: [facility];  Service: Urology;;   CYSTOSCOPY WITH BIOPSY  04/07/2020   Procedure: CYSTOSCOPY WITH BLADDER BIOPSY;  Surgeon: Malen Gauze, MD;  Location: [facility];  Service: Urology;;   Bluford Kaufmann WITH FULGERATION  04/07/2020   Procedure: Bluford Kaufmann WITH FULGERATION;  Surgeon: Malen Gauze, MD;  Location: [facility];  Service: Urology;;   CYSTOSCOPY/URETEROSCOPY/HOLMIUM LASER/STENT PLACEMENT Left 04/07/2020   Procedure: CYSTOSCOPY/LEFT URETEROSCOPY/HOLMIUM LASER LITHOTRIPSY/LEFT URETERAL STENT PLACEMENT;  Surgeon: Malen Gauze, MD;  Location: [facility];  Service: Urology;  Laterality: Left;   HERNIA REPAIR Left inguinal   LEFT HEART CATH AND CORONARY ANGIOGRAPHY N/A 01/31/2019   Procedure: LEFT HEART CATH AND CORONARY ANGIOGRAPHY;  Surgeon: Tonny Bollman, MD;  Location: [facility] INVASIVE CV LAB;  Service: Cardiovascular;  Laterality: N/A;   LEFT HEART CATHETERIZATION WITH CORONARY ANGIOGRAM N/A 04/21/2012   Procedure: LEFT HEART CATHETERIZATION WITH CORONARY ANGIOGRAM;  Surgeon: Herby Abraham, MD;  Location: [facility] CATH LAB;  Service: Cardiovascular;  Laterality: N/A;   LEFT HEART CATHETERIZATION WITH CORONARY ANGIOGRAM N/A 02/15/2013   Procedure: LEFT HEART CATHETERIZATION WITH CORONARY ANGIOGRAM;  Surgeon: Tonny Bollman, MD;  Location: [facility] CATH LAB;  Service: Cardiovascular;  Laterality: N/A;   LITHOTRIPSY  PERCUTANEOUS CORONARY STENT INTERVENTION (PCI-S) N/A 04/24/2012   Procedure: PERCUTANEOUS CORONARY STENT INTERVENTION (PCI-S);  Surgeon: Tonny Bollman, MD;  Location: [facility] CATH LAB;  Service: Cardiovascular;  Laterality: N/A;   PERCUTANEOUS CORONARY STENT INTERVENTION (PCI-S)  02/15/2013   Procedure: PERCUTANEOUS CORONARY STENT INTERVENTION (PCI-S);  Surgeon: Tonny Bollman, MD;  Location: [facility] CATH LAB;  Service: Cardiovascular;;    Social History   Socioeconomic History   Marital status: Married    Spouse name: [name]   Number of children: 1   Years of education: Not on file    Highest education level: Not on file  Occupational History   Not on file  Tobacco Use   Smoking status: Never   Smokeless tobacco: Never  Vaping Use   Vaping Use: Never used  Substance and Sexual Activity   Alcohol use: No    Comment: " quit along time ago"   Drug use: No   Sexual activity: Not Currently  Other Topics Concern   Not on file  Social History Narrative   Lives with wife, dgtr, 2 grandsons live with   Social Determinants of Health   Financial Resource Strain: Not on file  Food Insecurity: Not on file  Transportation Needs: Not on file  Physical Activity: Not on file  Stress: Not on file  Social Connections: Not on file  Intimate Partner Violence: Not on file    Family History  Problem Relation Age of Onset   Cancer Mother    Rheum arthritis Mother    Lung cancer Father    Stroke Brother     Anti-infectives: Anti-infectives (From admission, onward)    None       Current Facility-Administered Medications  Medication Dose Route Frequency Provider Last Rate Last Admin   acetaminophen (TYLENOL) tablet 650 mg  650 mg Oral Q6H PRN Adefeso, Oladapo, DO   650 mg at 03/26/23 0841   Or   acetaminophen (TYLENOL) suppository 650 mg  650 mg Rectal Q6H PRN Adefeso, Oladapo, DO       aspirin EC tablet 81 mg  81 mg Oral Daily Adefeso, Oladapo, DO       atorvastatin (LIPITOR) tablet 40 mg  40 mg Oral QHS Adefeso, Oladapo, DO       fesoterodine (TOVIAZ) tablet 4 mg  4 mg Oral Daily Adefeso, Oladapo, DO       insulin aspart (novoLOG) injection 0-15 Units  0-15 Units Subcutaneous Q4H Adefeso, Oladapo, DO       isosorbide mononitrate (IMDUR) 24 hr tablet 120 mg  120 mg Oral Daily Adefeso, Oladapo, DO       lisinopril (ZESTRIL) tablet 20 mg  20 mg Oral Daily Adefeso, Oladapo, DO   20 mg at 03/26/23 1019   metoprolol tartrate (LOPRESSOR) tablet 25 mg  25 mg Oral BID Adefeso, Oladapo, DO   25 mg at 03/26/23 1019   ondansetron (ZOFRAN) tablet 4 mg  4 mg Oral Q6H PRN  Adefeso, Oladapo, DO       Or   ondansetron (ZOFRAN) injection 4 mg  4 mg Intravenous Q6H PRN Adefeso, Oladapo, DO       pantoprazole (PROTONIX) EC tablet 40 mg  40 mg Oral Daily Adefeso, Oladapo, DO   40 mg at 03/26/23 1019   tamsulosin (FLOMAX) capsule 0.4 mg  0.4 mg Oral QPC supper Frankey Shown, DO       Current Outpatient Medications  Medication Sig Dispense Refill   acetaminophen (TYLENOL) 500 MG tablet Take 1,000 mg by mouth  every 6 (six) hours as needed for mild pain, moderate pain or headache.      amLODipine (NORVASC) 5 MG tablet TAKE 1 TABLET DAILY (Patient taking differently: Take 5 mg by mouth daily.) 90 tablet 0   aspirin EC 81 MG tablet Take 81 mg by mouth daily.     atorvastatin (LIPITOR) 40 MG tablet Take 40 mg by mouth at bedtime.      clopidogrel (PLAVIX) 75 MG tablet TAKE 1 TABLET (75 MG TOTAL) DAILY WITH BREAKFAST. (Patient taking differently: Take 75 mg by mouth daily.) 90 tablet 0   fesoterodine (TOVIAZ) 4 MG TB24 tablet Take 4 mg by mouth daily.     gabapentin (NEURONTIN) 300 MG capsule Take 1 pill at bedtime for one week, then increase to 1 pill twice a day for one week, then increase to 1 pill three times a day (Patient taking differently: Take 300 mg by mouth 3 (three) times daily.) 90 capsule 6   isosorbide mononitrate (IMDUR) 120 MG 24 hr tablet Take 1 tablet (120 mg total) by mouth daily. 90 tablet 3   lisinopril (PRINIVIL,ZESTRIL) 20 MG tablet Take 20 mg daily by mouth.     metFORMIN (GLUCOPHAGE) 1000 MG tablet Take 1,000 mg by mouth 2 (two) times daily.     metoprolol tartrate (LOPRESSOR) 25 MG tablet TAKE 1 TABLET TWICE A DAY (Patient taking differently: Take 25 mg by mouth daily.) 60 tablet 0   Multiple Vitamins-Minerals (PRESERVISION AREDS 2 PO) Take 1 tablet by mouth in the morning and at bedtime.     nitroGLYCERIN (NITROSTAT) 0.4 MG SL tablet Place 1 tablet (0.4 mg total) under the tongue every 5 (five) minutes x 3 doses as needed for chest pain. 25 tablet  3   pantoprazole (PROTONIX) 40 MG tablet TAKE 1 TABLET BY MOUTH ONCE DAILY --  NEEDS  APPOINTMENT  FOR  FURTHER  REFILLS (Patient taking differently: Take 40 mg by mouth daily.) 15 tablet 0   tamsulosin (FLOMAX) 0.4 MG CAPS capsule Take 1 capsule (0.4 mg total) by mouth in the morning and at bedtime. 180 capsule 3   nitrofurantoin, macrocrystal-monohydrate, (MACROBID) 100 MG capsule Take 1 capsule (100 mg total) by mouth every 12 (twelve) hours. (Patient not taking: Reported on 03/26/2023) 14 capsule 0     Objective: Vital signs in last 24 hours: BP (!) 146/87   Pulse 76   Temp 97.7 F (36.5 C) (Oral)   Resp 18   Wt 111.1 kg   SpO2 95%   BMI 33.23 kg/m   Intake/Output from previous day: 05/03 0701 - 05/04 0700 In: 6000  Out: 3000 [Urine:3000] Intake/Output this shift: No intake/output data recorded.   Physical Exam Vitals reviewed.  Constitutional:      Appearance: Normal appearance.  Abdominal:     Palpations: Abdomen is soft. There is no mass.     Tenderness: There is no abdominal tenderness.  Genitourinary:    Penis: Normal.      Comments: 53fr 3 foley indwelling with moderately bloody urine on CBI.   Neurological:     Mental Status: He is alert.     Lab Results:  Results for orders placed or performed during the hospital encounter of 03/25/23 (from the past 24 hour(s))  Urinalysis, Routine w reflex microscopic -Urine, Clean Catch     Status: Abnormal   Collection Time: 03/25/23 10:55 PM  Result Value Ref Range   Color, Urine RED (A) YELLOW   APPearance HAZY (A) CLEAR  Specific Gravity, Urine  1.005 - 1.030    TEST NOT REPORTED DUE TO COLOR INTERFERENCE OF URINE PIGMENT   pH  5.0 - 8.0    TEST NOT REPORTED DUE TO COLOR INTERFERENCE OF URINE PIGMENT   Glucose, UA NEGATIVE NEGATIVE mg/dL   Hgb urine dipstick NEGATIVE NEGATIVE   Bilirubin Urine NEGATIVE NEGATIVE   Ketones, ur NEGATIVE NEGATIVE mg/dL   Protein, ur NEGATIVE NEGATIVE mg/dL   Nitrite NEGATIVE  NEGATIVE   Leukocytes,Ua NEGATIVE NEGATIVE   RBC / HPF >50 0 - 5 RBC/hpf   WBC, UA 6-10 0 - 5 WBC/hpf   Bacteria, UA NONE SEEN NONE SEEN   Squamous Epithelial / HPF 0-5 0 - 5 /HPF  CBC with Differential     Status: Abnormal   Collection Time: 03/25/23 11:34 PM  Result Value Ref Range   WBC 10.5 4.0 - 10.5 K/uL   RBC 3.95 (L) 4.22 - 5.81 MIL/uL   Hemoglobin 10.5 (L) 13.0 - 17.0 g/dL   HCT 16.1 (L) 09.6 - 04.5 %   MCV 83.8 80.0 - 100.0 fL   MCH 26.6 26.0 - 34.0 pg   MCHC 31.7 30.0 - 36.0 g/dL   RDW 40.9 81.1 - 91.4 %   Platelets 249 150 - 400 K/uL   nRBC 0.0 0.0 - 0.2 %   Neutrophils Relative % 64 %   Neutro Abs 6.7 1.7 - 7.7 K/uL   Lymphocytes Relative 22 %   Lymphs Abs 2.4 0.7 - 4.0 K/uL   Monocytes Relative 10 %   Monocytes Absolute 1.1 (H) 0.1 - 1.0 K/uL   Eosinophils Relative 3 %   Eosinophils Absolute 0.3 0.0 - 0.5 K/uL   Basophils Relative 1 %   Basophils Absolute 0.1 0.0 - 0.1 K/uL   Immature Granulocytes 0 %   Abs Immature Granulocytes 0.02 0.00 - 0.07 K/uL  Comprehensive metabolic panel     Status: Abnormal   Collection Time: 03/25/23 11:34 PM  Result Value Ref Range   Sodium 134 (L) 135 - 145 mmol/L   Potassium 3.6 3.5 - 5.1 mmol/L   Chloride 105 98 - 111 mmol/L   CO2 21 (L) 22 - 32 mmol/L   Glucose, Bld 111 (H) 70 - 99 mg/dL   BUN 12 8 - 23 mg/dL   Creatinine, Ser 7.82 0.61 - 1.24 mg/dL   Calcium 8.2 (L) 8.9 - 10.3 mg/dL   Total Protein 6.3 (L) 6.5 - 8.1 g/dL   Albumin 3.7 3.5 - 5.0 g/dL   AST 21 15 - 41 U/L   ALT 19 0 - 44 U/L   Alkaline Phosphatase 77 38 - 126 U/L   Total Bilirubin 0.5 0.3 - 1.2 mg/dL   GFR, Estimated >95 >62 mL/min   Anion gap 8 5 - 15  Comprehensive metabolic panel     Status: Abnormal   Collection Time: 03/26/23  6:12 AM  Result Value Ref Range   Sodium 134 (L) 135 - 145 mmol/L   Potassium 3.7 3.5 - 5.1 mmol/L   Chloride 104 98 - 111 mmol/L   CO2 23 22 - 32 mmol/L   Glucose, Bld 109 (H) 70 - 99 mg/dL   BUN 11 8 - 23 mg/dL    Creatinine, Ser 1.30 0.61 - 1.24 mg/dL   Calcium 8.2 (L) 8.9 - 10.3 mg/dL   Total Protein 6.0 (L) 6.5 - 8.1 g/dL   Albumin 3.6 3.5 - 5.0 g/dL   AST 18 15 - 41 U/L  ALT 18 0 - 44 U/L   Alkaline Phosphatase 76 38 - 126 U/L   Total Bilirubin 0.8 0.3 - 1.2 mg/dL   GFR, Estimated >16 >10 mL/min   Anion gap 7 5 - 15  CBC     Status: Abnormal   Collection Time: 03/26/23  6:12 AM  Result Value Ref Range   WBC 7.6 4.0 - 10.5 K/uL   RBC 3.90 (L) 4.22 - 5.81 MIL/uL   Hemoglobin 10.4 (L) 13.0 - 17.0 g/dL   HCT 96.0 (L) 45.4 - 09.8 %   MCV 83.6 80.0 - 100.0 fL   MCH 26.7 26.0 - 34.0 pg   MCHC 31.9 30.0 - 36.0 g/dL   RDW 11.9 14.7 - 82.9 %   Platelets 237 150 - 400 K/uL   nRBC 0.0 0.0 - 0.2 %  Magnesium     Status: None   Collection Time: 03/26/23  6:12 AM  Result Value Ref Range   Magnesium 1.7 1.7 - 2.4 mg/dL  Phosphorus     Status: None   Collection Time: 03/26/23  6:12 AM  Result Value Ref Range   Phosphorus 3.4 2.5 - 4.6 mg/dL  CBG monitoring, ED     Status: Abnormal   Collection Time: 03/26/23  7:49 AM  Result Value Ref Range   Glucose-Capillary 115 (H) 70 - 99 mg/dL    BMET Recent Labs    03/25/23 2334 03/26/23 0612  NA 134* 134*  K 3.6 3.7  CL 105 104  CO2 21* 23  GLUCOSE 111* 109*  BUN 12 11  CREATININE 0.72 0.62  CALCIUM 8.2* 8.2*   PT/INR No results for input(s): "LABPROT", "INR" in the last 72 hours. ABG No results for input(s): "PHART", "HCO3" in the last 72 hours.  Invalid input(s): "PCO2", "PO2"  Studies/Results: CT Renal Stone Study  Result Date: 03/25/2023 CLINICAL DATA:  Abdominal/flank pain, stone suspected EXAM: CT ABDOMEN AND PELVIS WITHOUT CONTRAST TECHNIQUE: Multidetector CT imaging of the abdomen and pelvis was performed following the standard protocol without IV contrast. RADIATION DOSE REDUCTION: This exam was performed according to the departmental dose-optimization program which includes automated exposure control, adjustment of the mA and/or  kV according to patient size and/or use of iterative reconstruction technique. COMPARISON:  05/18/2022 FINDINGS: Lower chest: No acute abnormality Hepatobiliary: No focal hepatic abnormality. Gallbladder unremarkable. Pancreas: No focal abnormality or ductal dilatation. Spleen: No focal abnormality.  Normal size. Adrenals/Urinary Tract: Horseshoe kidney. Punctate 1 mm stone in the lower pole of the right kidney. No ureteral stones or hydronephrosis. Multiple bladder wall diverticula are noted. There is high-density material within the bladder lumen and the posterior diverticula compatible with blood clot. Stomach/Bowel: Sigmoid diverticulosis. No active diverticulitis. Stomach and small bowel decompressed, unremarkable. Vascular/Lymphatic: Aortic atherosclerosis. No evidence of aneurysm or adenopathy. Reproductive: Prostate enlargement. Other: No free fluid or free air. Musculoskeletal: No acute bony abnormality. IMPRESSION: Horseshoe kidney. No ureteral stones or hydronephrosis. Stable punctate right lower pole nephrolithiasis. Blood clot seen within the urinary bladder. Multiple posterior and left lateral bladder wall diverticula. Consider further evaluation with cystoscopy. Sigmoid diverticulosis.  No active diverticulitis. Aortic atherosclerosis. Electronically Signed   By: Charlett Nose M.D.   On: 03/25/2023 23:24    Procedure:  I irrigated his foley with 1.5l of NS with return of several clots but eventual clearing of the urine.   CBI ran clear after irrigation.  Assessment/Plan: Gross hematuria with clot retention on plavix.   I got him irrigated free of clot  but would recommend overnight observation on CBI to make sure the bleeding doesn't recur.  Hold plavix for now.  Voiding trial in am if the urine is clear.   History of UTI.   He had some dysuria prior to onset of hematuria so hemorrhagic cystitis is possible.  Antibiotics pending a culture is appropriate.  History of bladder cancer.  Cystoscopy  was negative in January, but should be repeated as an outpatient with the bleeding.   I will arrange f/u.  BPH with BOO and OAB.  Continue tamsulosin and toviaz.         No follow-ups on file.    CC: Dr. Candelaria Stagers and Dr. Wilkie Aye.      Bjorn Pippin 03/26/2023

## 2023-03-26 NOTE — Plan of Care (Signed)
  Problem: Coping: Goal: Ability to adjust to condition or change in health will improve Outcome: Progressing   Problem: Fluid Volume: Goal: Ability to maintain a balanced intake and output will improve Outcome: Progressing   Problem: Education: Goal: Knowledge of General Education information will improve Description: Including pain rating scale, medication(s)/side effects and non-pharmacologic comfort measures Outcome: Progressing   Problem: Health Behavior/Discharge Planning: Goal: Ability to manage health-related needs will improve Outcome: Progressing   Problem: Clinical Measurements: Goal: Ability to maintain clinical measurements within normal limits will improve Outcome: Progressing Goal: Will remain free from infection Outcome: Progressing   Problem: Activity: Goal: Risk for activity intolerance will decrease Outcome: Progressing   Problem: Nutrition: Goal: Adequate nutrition will be maintained Outcome: Progressing   Problem: Coping: Goal: Level of anxiety will decrease Outcome: Progressing   Problem: Pain Managment: Goal: General experience of comfort will improve Outcome: Progressing

## 2023-03-26 NOTE — Progress Notes (Signed)
Patient seen and evaluated, chart reviewed, please see EMR for updated orders. Please see full H&P dictated by admitting physician Dr Thomes Dinning for same date of service.   Brief Summary:- 72 y.o. male with medical history significant of hypertension, hyperlipidemia, type 2 diabetes mellitus, CAD s/p stent placement, GERD, nephrolithiasis and history of UTIs admitted on 03/26/23 with persistent Hematuria with clots.   A/p 1)Persistent Hematuria---with clots despite continous 3 way bladder irrigation CT abdomen pelvis without contrast showed horseshoe kidney.  No ureteral stones or hydronephrosis. Stable punctate right lower pole nephrolithiasis. Blood clot seen within the urinary bladder. Multiple posterior and left lateral bladder wall diverticula.  --Further evaluation with cystoscopy advised -Urologist (Dr. Annabell Howells) requested transfer to [facility] for further urological evaluation  2)BPH/bladder outlet obstruction/overactive bladder Continue Flomax and Toviaz  3)H/o CAD--status post prior angioplasty and stent placement to LAD and RCA -chest pain-free -It appears that patient's stents were several years ago -Hold Plavix and aspirin,  -continue atorvastatin, Imdur and metoprolol -Please consider restarting aspirin prior to discharge if hematuria improves -Plavix may be held until later  4)DM2-recent A1c 6.4 reflecting good diabetic control PTA Use Novolog/Humalog Sliding scale insulin with Accu-Cheks/Fingersticks as ordered   5)GERD--stable, continue Protonix - After discussion with EDP and Dr. Annabell Howells from urology service patient is being transferred to [facility] ED to ED-no beds available at this time at [facility] long inpatient -A bed is to be assigned the patient after has been evaluated by Dr. Cyndie Chime in the ED and after cystoscopy with further urological intervention  Total care time 53 minutes  Shon Hale, MD

## 2023-03-26 NOTE — H&P (Signed)
History and Physical    Patient: Kyle Lambert:[id number] DOB: 1951-02-15 DOA: 03/25/2023 DOS: the patient was seen and examined on 03/26/2023 PCP: Juliette Alcide, MD  Patient coming from: Home  Chief Complaint:  Chief Complaint  Patient presents with   Hematuria   HPI: Kyle Lambert is a 72 y.o. male with medical history significant of hypertension, hyperlipidemia, type 2 diabetes mellitus, CAD s/p stent placement, GERD, nephrolithiasis and history of UTIs who presents to the department due to blood in urine which started yesterday in the afternoon.  He complained of pain on urination and that he has been passing clots, patient also complained of abdominal and left flank pain.  He presented to the emergency department for further evaluation and management.  He denies chest pain, nausea, vomiting, fever, chills.  ED Course:  In the emergency department, patient was hemodynamically stable, BP was 130/97 and other vital signs are within normal range.  Workup in the ED showed normocytic anemia, BMP was normal except for sodium of 134, bicarb 21, blood glucose 111.  Urinalysis was hazy in appearance and red in color.  Magnesium 1.7, phosphorus 3.4 CT abdomen pelvis without contrast showed horseshoe kidney.  No ureteral stones or hydronephrosis. Stable punctate right lower pole nephrolithiasis. Blood clot seen within the urinary bladder. Multiple posterior and left lateral bladder wall diverticula. Consider further evaluation with cystoscopy. Patient continues to have persistent hematuria after 3 L NS irrigation, urologist (Dr. Annabell Howells) consulted and recommended admitting patient to hospitalist service at [facility] with plan to see patient in consultation.  It was recommended to be held at this time.  Hospitalist was asked to admit patient for further evaluation and management.  Review of Systems: Review of systems as noted in the HPI. All other systems reviewed and are negative.   Past  Medical History:  Diagnosis Date   Arthritis    Bladder cancer (HCC) 03/2020   CAD (coronary artery disease)    a. 03/2012 Cath: LAD 80, RCA 80, nl EF;  b. PCI of RCA w 3.0x64mm Promus DES, FFR of LAD was nl @ 0.85 ->Med Rx.;  c. LHC 02/15/13: proximal LAD 40-50%, mid LAD 95%, small D1 95-99%, mid CFX 30-40%, proximal RCA stent patent with ostial 20-30% prior to the stent, ostial PDA 50%, EF 55-65%. PCI:  Promus DES to the mid LAD and POBA to the proximal D1    Headache    Hyperlipidemia    Hypertension    Kidney stone    Obesity    OSA (obstructive sleep apnea)    Umbilical hernia    Past Surgical History:  Procedure Laterality Date   CORONARY ANGIOPLASTY WITH STENT PLACEMENT  04/24/12   DES-RCA   CORONARY ANGIOPLASTY WITH STENT PLACEMENT  02/15/13   Patent RCA stent, diffuse nonobstructive R PDA, LCx disease, 99% mid LAD stenosis s/p DES-mid LAD; LVEF 60%   CORONARY STENT PLACEMENT     CYSTOSCOPY W/ RETROGRADES  04/07/2020   Procedure: CYSTOSCOPY WITH LEFT RETROGRADE PYELOGRAM;  Surgeon: Malen Gauze, MD;  Location: [facility];  Service: Urology;;   CYSTOSCOPY WITH BIOPSY  04/07/2020   Procedure: CYSTOSCOPY WITH BLADDER BIOPSY;  Surgeon: Malen Gauze, MD;  Location: [facility];  Service: Urology;;   Bluford Kaufmann WITH FULGERATION  04/07/2020   Procedure: Bluford Kaufmann WITH FULGERATION;  Surgeon: Malen Gauze, MD;  Location: [facility];  Service: Urology;;   CYSTOSCOPY/URETEROSCOPY/HOLMIUM LASER/STENT PLACEMENT Left 04/07/2020   Procedure: CYSTOSCOPY/LEFT URETEROSCOPY/HOLMIUM LASER LITHOTRIPSY/LEFT URETERAL STENT  PLACEMENT;  Surgeon: Malen Gauze, MD;  Location: [facility];  Service: Urology;  Laterality: Left;   HERNIA REPAIR Left inguinal   LEFT HEART CATH AND CORONARY ANGIOGRAPHY N/A 01/31/2019   Procedure: LEFT HEART CATH AND CORONARY ANGIOGRAPHY;  Surgeon: Tonny Bollman, MD;  Location: [facility] INVASIVE CV LAB;  Service: Cardiovascular;  Laterality: N/A;   LEFT HEART CATHETERIZATION  WITH CORONARY ANGIOGRAM N/A 04/21/2012   Procedure: LEFT HEART CATHETERIZATION WITH CORONARY ANGIOGRAM;  Surgeon: Herby Abraham, MD;  Location: [facility] CATH LAB;  Service: Cardiovascular;  Laterality: N/A;   LEFT HEART CATHETERIZATION WITH CORONARY ANGIOGRAM N/A 02/15/2013   Procedure: LEFT HEART CATHETERIZATION WITH CORONARY ANGIOGRAM;  Surgeon: Tonny Bollman, MD;  Location: [facility] CATH LAB;  Service: Cardiovascular;  Laterality: N/A;   LITHOTRIPSY     PERCUTANEOUS CORONARY STENT INTERVENTION (PCI-S) N/A 04/24/2012   Procedure: PERCUTANEOUS CORONARY STENT INTERVENTION (PCI-S);  Surgeon: Tonny Bollman, MD;  Location: [facility] CATH LAB;  Service: Cardiovascular;  Laterality: N/A;   PERCUTANEOUS CORONARY STENT INTERVENTION (PCI-S)  02/15/2013   Procedure: PERCUTANEOUS CORONARY STENT INTERVENTION (PCI-S);  Surgeon: Tonny Bollman, MD;  Location: [facility] CATH LAB;  Service: Cardiovascular;;    Social History:  reports that he has never smoked. He has never used smokeless tobacco. He reports that he does not drink alcohol and does not use drugs.   No Known Allergies  Family History  Problem Relation Age of Onset   Cancer Mother    Rheum arthritis Mother    Lung cancer Father    Stroke Brother      Prior to Admission medications   Medication Sig Start Date End Date Taking? Authorizing Provider  acetaminophen (TYLENOL) 500 MG tablet Take 1,000 mg by mouth every 6 (six) hours as needed for mild pain, moderate pain or headache.     [provider]  amLODipine (NORVASC) 5 MG tablet TAKE 1 TABLET DAILY Patient taking differently: Take 5 mg by mouth daily. 12/25/13   Tonny Bollman, MD  aspirin EC 81 MG tablet Take 81 mg by mouth daily.    [provider]  atorvastatin (LIPITOR) 40 MG tablet Take 40 mg by mouth at bedtime.     [provider]  clopidogrel (PLAVIX) 75 MG tablet TAKE 1 TABLET (75 MG TOTAL) DAILY WITH BREAKFAST. Patient taking differently: Take 75 mg by mouth daily. 12/25/13    Tonny Bollman, MD  cyclobenzaprine (FLEXERIL) 10 MG tablet Take 10 mg by mouth 3 (three) times daily as needed for muscle spasms.  10/11/18   [provider]  fesoterodine (TOVIAZ) 4 MG TB24 tablet Take 4 mg by mouth daily. 05/05/22   [provider]  gabapentin (NEURONTIN) 300 MG capsule Take 1 pill at bedtime for one week, then increase to 1 pill twice a day for one week, then increase to 1 pill three times a day 07/27/22   Ocie Doyne, MD  isosorbide mononitrate (IMDUR) 120 MG 24 hr tablet Take 1 tablet (120 mg total) by mouth daily. 08/24/17   Tereso Newcomer T, PA-C  lisinopril (PRINIVIL,ZESTRIL) 20 MG tablet Take 20 mg daily by mouth.    [provider]  metFORMIN (GLUCOPHAGE) 500 MG tablet Take 500 mg by mouth 2 (two) times daily. 01/22/20   [provider]  metoprolol tartrate (LOPRESSOR) 25 MG tablet TAKE 1 TABLET TWICE A DAY Patient taking differently: Take 25 mg by mouth in the morning and at bedtime.    Tonny Bollman, MD  nitrofurantoin, macrocrystal-monohydrate, (MACROBID) 100 MG capsule  Take 1 capsule (100 mg total) by mouth every 12 (twelve) hours. 12/07/22   McKenzie, Mardene Celeste, MD  nitroGLYCERIN (NITROSTAT) 0.4 MG SL tablet Place 1 tablet (0.4 mg total) under the tongue every 5 (five) minutes x 3 doses as needed for chest pain. 04/25/12   Creig Hines, NP  ondansetron (ZOFRAN ODT) 4 MG disintegrating tablet Take 1 tablet (4 mg total) by mouth every 8 (eight) hours as needed for nausea or vomiting. 03/25/20   Maxwell Caul, PA-C  oxyCODONE-acetaminophen (PERCOCET) 5-325 MG tablet Take 1 tablet by mouth every 4 (four) hours as needed for moderate pain or severe pain. 05/22/20   McKenzie, Mardene Celeste, MD  pantoprazole (PROTONIX) 40 MG tablet TAKE 1 TABLET BY MOUTH ONCE DAILY --  NEEDS  APPOINTMENT  FOR  FURTHER  REFILLS 01/01/21   Tonny Bollman, MD  tamsulosin [facility]) 0.4 MG CAPS capsule Take 1 capsule (0.4 mg total) by mouth in the morning  and at bedtime. 05/21/22   McKenzie, Mardene Celeste, MD  tolterodine (DETROL LA) 2 MG 24 hr capsule Take 2 capsules (4 mg total) by mouth daily. 12/24/20   Malen Gauze, MD    Physical Exam: BP (!) 143/92   Pulse 77   Temp 98.5 F (36.9 C)   Resp 16   Wt 111.1 kg   SpO2 97%   BMI 33.23 kg/m   General: 72 y.o. year-old male well developed well nourished in no acute distress.  Alert and oriented x3. HEENT: NCAT, EOMI Neck: Supple, trachea medial Cardiovascular: Regular rate and rhythm with no rubs or gallops.  No thyromegaly or JVD noted.  Bilateral trace lower extremity edema. 2/4 pulses in all 4 extremities. Respiratory: Clear to auscultation with no wheezes or rales. Good inspiratory effort. Abdomen: Soft, nontender nondistended with normal bowel sounds x4 quadrants. Muskuloskeletal: No cyanosis, clubbing noted bilaterally Neuro: CN II-XII intact, strength 5/5 x 4, sensation, reflexes intact Skin: No ulcerative lesions noted or rashes Psychiatry: Judgement and insight appear normal. Mood is appropriate for condition and setting          Labs on Admission:  Basic Metabolic Panel: Recent Labs  Lab 03/25/23 2334 03/26/23 0612  NA 134* 134*  K 3.6 3.7  CL 105 104  CO2 21* 23  GLUCOSE 111* 109*  BUN 12 11  CREATININE 0.72 0.62  CALCIUM 8.2* 8.2*  MG  --  1.7  PHOS  --  3.4   Liver Function Tests: Recent Labs  Lab 03/25/23 2334 03/26/23 0612  AST 21 18  ALT 19 18  ALKPHOS 77 76  BILITOT 0.5 0.8  PROT 6.3* 6.0*  ALBUMIN 3.7 3.6   No results for input(s): "LIPASE", "AMYLASE" in the last 168 hours. No results for input(s): "AMMONIA" in the last 168 hours. CBC: Recent Labs  Lab 03/25/23 2334 03/26/23 0612  WBC 10.5 7.6  NEUTROABS 6.7  --   HGB 10.5* 10.4*  HCT 33.1* 32.6*  MCV 83.8 83.6  PLT 249 237   Cardiac Enzymes: No results for input(s): "CKTOTAL", "CKMB", "CKMBINDEX", "TROPONINI" in the last 168 hours.  BNP (last 3 results) No results for  input(s): "BNP" in the last 8760 hours.  ProBNP (last 3 results) No results for input(s): "PROBNP" in the last 8760 hours.  CBG: No results for input(s): "GLUCAP" in the last 168 hours.  Radiological Exams on Admission: CT Renal Stone Study  Result Date: 03/25/2023 CLINICAL DATA:  Abdominal/flank pain, stone suspected EXAM: CT ABDOMEN AND PELVIS  WITHOUT CONTRAST TECHNIQUE: Multidetector CT imaging of the abdomen and pelvis was performed following the standard protocol without IV contrast. RADIATION DOSE REDUCTION: This exam was performed according to the departmental dose-optimization program which includes automated exposure control, adjustment of the mA and/or kV according to patient size and/or use of iterative reconstruction technique. COMPARISON:  05/18/2022 FINDINGS: Lower chest: No acute abnormality Hepatobiliary: No focal hepatic abnormality. Gallbladder unremarkable. Pancreas: No focal abnormality or ductal dilatation. Spleen: No focal abnormality.  Normal size. Adrenals/Urinary Tract: Horseshoe kidney. Punctate 1 mm stone in the lower pole of the right kidney. No ureteral stones or hydronephrosis. Multiple bladder wall diverticula are noted. There is high-density material within the bladder lumen and the posterior diverticula compatible with blood clot. Stomach/Bowel: Sigmoid diverticulosis. No active diverticulitis. Stomach and small bowel decompressed, unremarkable. Vascular/Lymphatic: Aortic atherosclerosis. No evidence of aneurysm or adenopathy. Reproductive: Prostate enlargement. Other: No free fluid or free air. Musculoskeletal: No acute bony abnormality. IMPRESSION: Horseshoe kidney. No ureteral stones or hydronephrosis. Stable punctate right lower pole nephrolithiasis. Blood clot seen within the urinary bladder. Multiple posterior and left lateral bladder wall diverticula. Consider further evaluation with cystoscopy. Sigmoid diverticulosis.  No active diverticulitis. Aortic  atherosclerosis. Electronically Signed   By: Charlett Nose M.D.   On: 03/25/2023 23:24    EKG: I independently viewed the EKG done and my findings are as followed: EKG was not done in the ED  Assessment/Plan Present on Admission:  Hematuria  Benign prostatic hyperplasia with urinary obstruction  Obesity  CAD (coronary artery disease)  Essential hypertension  Mixed hyperlipidemia  Principal Problem:   Hematuria Active Problems:   Essential hypertension   CAD (coronary artery disease)   Mixed hyperlipidemia   Obesity   Benign prostatic hyperplasia with urinary obstruction   Type 2 diabetes mellitus (HCC)  Hematuria Patient with Foley catheter with hematuria despite irrigation Labs did not indicate UTI, CT abdomen and pelvis CT abdomen pelvis showed no ureteral stones, but showed  Blood clot seen within the urinary bladder. Multiple posterior and left lateral bladder wall diverticula. Urologist at [facility] (Dr. Annabell Howells) was consulted and recommended admitting patient to [facility] with plan to consult on patient when he arrives at [facility] Patient will be placed n.p.o. at this time in anticipation for possible surgical intervention on arrival to [facility].  BPH/bladder outlet obstruction Continue Flomax  Overactive bladder Continue Toviaz  Essential hypertension Continue lisinopril, Imdur, Lopressor  Mixed hyperlipidemia Continue Lipitor  CAD Continue Lipitor, imdur, Lopressor, aspirin  T2DM Continue ISS and hypoglycemia protocol  GERD Continue Protonix  Obesity (BMI 33.23) Diet and lifestyle modification   DVT prophylaxis: SCDs  Advance Care Planning:   Code Status: Full Code   Consults: Urology  Family Communication: Wife at bedside (all questions answered to satisfaction)  Severity of Illness: The appropriate patient status for this patient is INPATIENT. Inpatient status is judged to be reasonable and necessary in order to provide the required intensity  of service to ensure the patient's safety. The patient's presenting symptoms, physical exam findings, and initial radiographic and laboratory data in the context of their chronic comorbidities is felt to place them at high risk for further clinical deterioration. Furthermore, it is not anticipated that the patient will be medically stable for discharge from the hospital within 2 midnights of admission.   * I certify that at the point of admission it is my clinical judgment that the patient will require inpatient hospital care spanning beyond 2 midnights from the point  of admission due to high intensity of service, high risk for further deterioration and high frequency of surveillance required.*  Author: Frankey Shown, DO 03/26/2023 7:43 AM  For on call review [email or web address].

## 2023-03-27 DIAGNOSIS — R319 Hematuria, unspecified: Secondary | ICD-10-CM | POA: Diagnosis not present

## 2023-03-27 LAB — CBC
HCT: 37.9 % — ABNORMAL LOW (ref 39.0–52.0)
Hemoglobin: 11.7 g/dL — ABNORMAL LOW (ref 13.0–17.0)
MCH: 26.2 pg (ref 26.0–34.0)
MCHC: 30.9 g/dL (ref 30.0–36.0)
MCV: 84.8 fL (ref 80.0–100.0)
Platelets: 260 10*3/uL (ref 150–400)
RBC: 4.47 MIL/uL (ref 4.22–5.81)
RDW: 15.2 % (ref 11.5–15.5)
WBC: 7.4 10*3/uL (ref 4.0–10.5)
nRBC: 0 % (ref 0.0–0.2)

## 2023-03-27 LAB — BASIC METABOLIC PANEL
Anion gap: 7 (ref 5–15)
BUN: 12 mg/dL (ref 8–23)
CO2: 23 mmol/L (ref 22–32)
Calcium: 8.5 mg/dL — ABNORMAL LOW (ref 8.9–10.3)
Chloride: 102 mmol/L (ref 98–111)
Creatinine, Ser: 0.75 mg/dL (ref 0.61–1.24)
GFR, Estimated: >60 mL/min (ref >60)
Glucose, Bld: 131 mg/dL — ABNORMAL HIGH (ref 70–99)
Potassium: 4.2 mmol/L (ref 3.5–5.1)
Sodium: 132 mmol/L — ABNORMAL LOW (ref 135–145)

## 2023-03-27 LAB — GLUCOSE, CAPILLARY
Glucose-Capillary: 115 mg/dL — ABNORMAL HIGH (ref 70–99)
Glucose-Capillary: 143 mg/dL — ABNORMAL HIGH (ref 70–99)

## 2023-03-27 MED ORDER — NITROFURANTOIN MONOHYD MACRO 100 MG PO CAPS
100.0000 mg | ORAL_CAPSULE | Freq: Two times a day (BID) | ORAL | Status: DC
  Administered 2023-03-27: 100 mg via ORAL
  Filled 2023-03-27: qty 1

## 2023-03-27 MED ORDER — NITROFURANTOIN MONOHYD MACRO 100 MG PO CAPS: 100.0000 mg | ORAL_CAPSULE | Freq: Two times a day (BID) | ORAL | 0 refills | Status: DC

## 2023-03-27 NOTE — Progress Notes (Signed)
  Transition of Care [facility]) Screening Note   Patient Details  Name: DEVION SABAL Date of Birth: June 12, 1951   [facility] of [facility]) CM/SW Contact:    Adrian Prows, RN Phone Number: 03/27/2023, 2:13 PM    Transition of Care Department [facility]) has reviewed patient and no TOC needs have been identified at this time. We will continue to monitor patient advancement through interdisciplinary progression rounds. If new patient transition needs arise, please place a TOC consult.

## 2023-03-27 NOTE — Progress Notes (Signed)
Subjective: Kyle Lambert is doing well.  He has no pain or associated complaints.  His urine is clear.  Hgb is 10.4.  ROS:  Review of Systems  All other systems reviewed and are negative.   Anti-infectives: Anti-infectives (From admission, onward)    Start     Dose/Rate Route Frequency Ordered Stop   03/27/23 1000  nitrofurantoin (macrocrystal-monohydrate) (MACROBID) capsule 100 mg        100 mg Oral Every 12 hours 03/27/23 0731     03/27/23 0000  nitrofurantoin, macrocrystal-monohydrate, (MACROBID) 100 MG capsule        100 mg Oral Every 12 hours 03/27/23 0728         Current Facility-Administered Medications  Medication Dose Route Frequency Provider Last Rate Last Admin   acetaminophen (TYLENOL) tablet 650 mg  650 mg Oral Q6H PRN Adefeso, Oladapo, DO   650 mg at 03/26/23 2145   Or   acetaminophen (TYLENOL) suppository 650 mg  650 mg Rectal Q6H PRN Adefeso, Oladapo, DO       [START ON 03/30/2023] aspirin EC tablet 81 mg  81 mg Oral Daily Emokpae, Courage, MD       atorvastatin (LIPITOR) tablet 40 mg  40 mg Oral QHS Adefeso, Oladapo, DO   40 mg at 03/26/23 2145   fesoterodine (TOVIAZ) tablet 4 mg  4 mg Oral Daily Adefeso, Oladapo, DO   4 mg at 03/26/23 1214   insulin aspart (novoLOG) injection 0-15 Units  0-15 Units Subcutaneous TID WC Gonfa, Taye T, MD       insulin aspart (novoLOG) injection 0-5 Units  0-5 Units Subcutaneous QHS Candelaria Stagers T, MD       isosorbide mononitrate (IMDUR) 24 hr tablet 120 mg  120 mg Oral Daily Adefeso, Oladapo, DO   120 mg at 03/26/23 1247   metoprolol tartrate (LOPRESSOR) tablet 25 mg  25 mg Oral BID Adefeso, Oladapo, DO   25 mg at 03/26/23 2145   nitrofurantoin (macrocrystal-monohydrate) (MACROBID) capsule 100 mg  100 mg Oral Q12H Bjorn Pippin, MD       ondansetron [facility]) tablet 4 mg  4 mg Oral Q6H PRN Adefeso, Oladapo, DO       Or   ondansetron (ZOFRAN) injection 4 mg  4 mg Intravenous Q6H PRN Adefeso, Oladapo, DO       pantoprazole (PROTONIX) EC  tablet 40 mg  40 mg Oral Daily Adefeso, Oladapo, DO   40 mg at 03/26/23 1019   tamsulosin (FLOMAX) capsule 0.4 mg  0.4 mg Oral QPC supper Adefeso, Oladapo, DO   0.4 mg at 03/26/23 1805     Objective: Vital signs in last 24 hours: Temp:  [97.5 F (36.4 C)-98.1 F (36.7 C)] 97.8 F (36.6 C) (05/05 0415) Pulse Rate:  [60-78] 60 (05/05 0415) Resp:  [16-18] 18 (05/05 0415) BP: (100-175)/(69-164) 111/83 (05/05 0415) SpO2:  [92 %-99 %] 98 % (05/05 0415) Weight:  [106.2 kg] 106.2 kg (05/04 1915)  Intake/Output from previous day: 05/04 0701 - 05/05 0700 In: 900 [P.O.:900] Out: 4370 [Urine:4370] Intake/Output this shift: No intake/output data recorded.   Physical Exam Vitals reviewed.  Constitutional:      Appearance: Normal appearance.  Genitourinary:    Comments: Urine water clear in tubing on minimal CBI.  Neurological:     Mental Status: He is alert.     Lab Results:  Recent Labs    03/25/23 2334 03/26/23 0612  WBC 10.5 7.6  HGB 10.5* 10.4*  HCT 33.1* 32.6*  PLT 249  237   BMET Recent Labs    03/25/23 2334 03/26/23 0612  NA 134* 134*  K 3.6 3.7  CL 105 104  CO2 21* 23  GLUCOSE 111* 109*  BUN 12 11  CREATININE 0.72 0.62  CALCIUM 8.2* 8.2*   PT/INR No results for input(s): "LABPROT", "INR" in the last 72 hours. ABG No results for input(s): "PHART", "HCO3" in the last 72 hours.  Invalid input(s): "PCO2", "PO2"  Studies/Results: CT Renal Stone Study  Result Date: 03/25/2023 CLINICAL DATA:  Abdominal/flank pain, stone suspected EXAM: CT ABDOMEN AND PELVIS WITHOUT CONTRAST TECHNIQUE: Multidetector CT imaging of the abdomen and pelvis was performed following the standard protocol without IV contrast. RADIATION DOSE REDUCTION: This exam was performed according to the departmental dose-optimization program which includes automated exposure control, adjustment of the mA and/or kV according to patient size and/or use of iterative reconstruction technique.  COMPARISON:  05/18/2022 FINDINGS: Lower chest: No acute abnormality Hepatobiliary: No focal hepatic abnormality. Gallbladder unremarkable. Pancreas: No focal abnormality or ductal dilatation. Spleen: No focal abnormality.  Normal size. Adrenals/Urinary Tract: Horseshoe kidney. Punctate 1 mm stone in the lower pole of the right kidney. No ureteral stones or hydronephrosis. Multiple bladder wall diverticula are noted. There is high-density material within the bladder lumen and the posterior diverticula compatible with blood clot. Stomach/Bowel: Sigmoid diverticulosis. No active diverticulitis. Stomach and small bowel decompressed, unremarkable. Vascular/Lymphatic: Aortic atherosclerosis. No evidence of aneurysm or adenopathy. Reproductive: Prostate enlargement. Other: No free fluid or free air. Musculoskeletal: No acute bony abnormality. IMPRESSION: Horseshoe kidney. No ureteral stones or hydronephrosis. Stable punctate right lower pole nephrolithiasis. Blood clot seen within the urinary bladder. Multiple posterior and left lateral bladder wall diverticula. Consider further evaluation with cystoscopy. Sigmoid diverticulosis.  No active diverticulitis. Aortic atherosclerosis. Electronically Signed   By: Charlett Nose M.D.   On: 03/25/2023 23:24     Assessment and Plan: Gross hematuria with clot retention and a history of bladder cancer and UTI's.  His urine is clear today so I will d/c the foley.  He should be able to go home when voiding.  He should hold the plavix for another 48hrs and I will give him macrobid based on his prior culture.   He didn't have a culture this admission but he had some dysuria and mild pyuria.       LOS: 1 day    Bjorn Pippin 5/5/2024Patient ID: Kyle Lambert, male   DOB: October 04, 1951, 72 y.o.   MRN: [id number]

## 2023-03-27 NOTE — Progress Notes (Signed)
Nurse reviewed discharge instructions with pt.  Pt verbalized understanding of discharge instructions, follow up appointments and new medication.  No concerns at time of discharge. 

## 2023-03-27 NOTE — Discharge Summary (Signed)
[facility] Discharge Summary   Patient: Kyle Lambert BJY:[id number]  PCP: Kyle Alcide, MD  Date of admission: 03/25/2023   Date of discharge:  03/27/2023     Discharge Diagnoses:  Principal Problem:   Hematuria Active Problems:   Essential hypertension   CAD (coronary artery disease)   Mixed hyperlipidemia   Obesity   Benign prostatic hyperplasia with urinary obstruction   Type 2 diabetes mellitus (HCC)   Gross hematuria   Admitted From: Home Disposition:  Home   Recommendations for Outpatient Follow-up:  Follow-up with PCP in 1 week Follow-up with urology in 1 week.  Hold Plavix 5 2 days and then resume if no bleeding. Patient was advised to follow with cardiology as patient is on aspirin and Plavix both for several years, I did not know whether he needs dual antiplatelet therapy after CAD stent placed several years ago. Follow up LABS/TEST:     Follow-up Information     Lambert, Kyle Celeste, MD Follow up.   Specialty: Urology Why: Please reach out to the office if you don't hear from them in 2-3 days.  You should have follow up in a week or two.  I have let them know to work on an appointment Contact information: [address]  [address] [place] [address] [phone]                Diet recommendation: Cardiac diet  Activity: The patient is advised to gradually reintroduce usual activities, as tolerated  Discharge Condition: stable  Code Status: Full code   History of present illness: As per the H and P dictated on admission  Hospital Course:  Kyle Lambert is a 72 y.o. male with medical history significant of hypertension, hyperlipidemia, type 2 diabetes mellitus, CAD s/p stent placement, GERD, nephrolithiasis and history of UTIs who presents to the department due to blood in urine which started yesterday in the afternoon.   Foley catheter was inserted, urology was consulted and patient was admitted for further management as  below.  Assessment/Plan  Hematuria, Patient with Foley catheter with hematuria despite irrigation Labs did not indicate UTI, CT abdomen pelvis showed no ureteral stones, but showed  Blood clot seen within the urinary bladder. Multiple posterior and left lateral bladder wall diverticula. Urologist at [facility] (Kyle Lambert) was consulted and recommended admitting patient to [facility] with plan to consult on patient when he arrives at [facility].  Foley catheter was discontinued by urology, patient voided and no gross hematuria noticed.  Patient was advised to hold Plavix for 24 to 48 hours as per urology and patient was cleared to discharge home on Macrobid.  Patient agreed with the discharge planning, advised to follow-up with PCP,, urology and cardiology as an outpatient.   BPH/bladder outlet obstruction, Continue Flomax Overactive bladder, Continue Toviaz Essential hypertension, Continue lisinopril, Imdur, Lopressor Mixed hyperlipidemia, Continue Lipitor CAD, Continue Lipitor, imdur, Lopressor, aspirin.  Hold Plavix for 24 to 48 hours as per urology.  Patient was advised to follow-up with cardiology as an outpatient for the indication of dual antiplatelet therapy.  Patient had coronary stents several years ago. T2DM, s/p ISS and hypoglycemia protocol.  Resumed home regimen discharge. GERD, Continue Protonix Body mass index is 34.57 kg/m.  Nutrition Interventions:   Patient was ambulatory without any assistance. On the day of the discharge the patient's vitals were stable, and no other acute medical condition were reported by patient. the patient was felt safe to be discharge at Home.  Consultants: Urologist Procedures: None  Discharge Exam: General: Appear in no distress, no Rash; Oral Mucosa Clear, moist. Cardiovascular: S1 and S2 Present, no Murmur, Respiratory: normal respiratory effort, Bilateral Air entry present and no Crackles, no wheezes Abdomen: Bowel Sound present, Soft and no  tenderness, no hernia Extremities: no Pedal edema, no calf tenderness Neurology: alert and oriented to time, place, and person affect appropriate.  Filed Weights   03/25/23 2231 03/26/23 1915  Weight: 111.1 kg 106.2 kg   Vitals:   03/27/23 0415 03/27/23 1026  BP: 111/83 112/77  Pulse: 60 61  Resp: 18 16  Temp: 97.8 F (36.6 C)   SpO2: 98% 98%    DISCHARGE MEDICATION: Allergies as of 03/27/2023   No Known Allergies      Medication List     TAKE these medications    acetaminophen 500 MG tablet Commonly known as: TYLENOL Take 1,000 mg by mouth every 6 (six) hours as needed for mild pain, moderate pain or headache.   amLODipine 5 MG tablet Commonly known as: NORVASC TAKE 1 TABLET DAILY   aspirin EC 81 MG tablet Take 81 mg by mouth daily.   atorvastatin 40 MG tablet Commonly known as: LIPITOR Take 40 mg by mouth at bedtime.   clopidogrel 75 MG tablet Commonly known as: PLAVIX Take 1 tablet (75 mg total) by mouth daily. Start taking on: Mar 29, 2023 What changed:  See the new instructions. These instructions start on Mar 29, 2023. If you are unsure what to do until then, ask your doctor or other care provider.   fesoterodine 4 MG Tb24 tablet Commonly known as: TOVIAZ Take 4 mg by mouth daily.   gabapentin 300 MG capsule Commonly known as: Neurontin Take 1 pill at bedtime for one week, then increase to 1 pill twice a day for one week, then increase to 1 pill three times a day What changed:  how much to take how to take this when to take this additional instructions   isosorbide mononitrate 120 MG 24 hr tablet Commonly known as: Imdur Take 1 tablet (120 mg total) by mouth daily.   lisinopril 20 MG tablet Commonly known as: ZESTRIL Take 20 mg daily by mouth.   metFORMIN 1000 MG tablet Commonly known as: GLUCOPHAGE Take 1,000 mg by mouth 2 (two) times daily.   metoprolol tartrate 25 MG tablet Commonly known as: LOPRESSOR TAKE 1 TABLET TWICE A  DAY What changed: when to take this   nitrofurantoin (macrocrystal-monohydrate) 100 MG capsule Commonly known as: MACROBID Take 1 capsule (100 mg total) by mouth every 12 (twelve) hours.   nitroGLYCERIN 0.4 MG SL tablet Commonly known as: NITROSTAT Place 1 tablet (0.4 mg total) under the tongue every 5 (five) minutes x 3 doses as needed for chest pain.   pantoprazole 40 MG tablet Commonly known as: PROTONIX TAKE 1 TABLET BY MOUTH ONCE DAILY --  NEEDS  APPOINTMENT  FOR  FURTHER  REFILLS What changed: See the new instructions.   PRESERVISION AREDS 2 PO Take 1 tablet by mouth in the morning and at bedtime.   tamsulosin 0.4 MG Caps capsule Commonly known as: FLOMAX Take 1 capsule (0.4 mg total) by mouth in the morning and at bedtime.       No Known Allergies Discharge Instructions     Call MD for:   Complete by: As directed    Recurrent hematuria   Call MD for:  severe uncontrolled pain   Complete by: As directed  Diet - low sodium heart healthy   Complete by: As directed    Discharge instructions   Complete by: As directed    Follow-up with PCP in 1 week Follow-up with urology in 1 week.  Hold Plavix 5 2 days and then resume if no bleeding. Patient was advised to follow with cardiology as patient is on aspirin and Plavix both for several years, I did not know whether he needs dual antiplatelet therapy after CAD stent placed several years ago.   Increase activity slowly   Complete by: As directed        The results of significant diagnostics from this hospitalization (including imaging, microbiology, ancillary and laboratory) are listed below for reference.    Significant Diagnostic Studies: CT Renal Stone Study  Result Date: 03/25/2023 CLINICAL DATA:  Abdominal/flank pain, stone suspected EXAM: CT ABDOMEN AND PELVIS WITHOUT CONTRAST TECHNIQUE: Multidetector CT imaging of the abdomen and pelvis was performed following the standard protocol without IV contrast.  RADIATION DOSE REDUCTION: This exam was performed according to the departmental dose-optimization program which includes automated exposure control, adjustment of the mA and/or kV according to patient size and/or use of iterative reconstruction technique. COMPARISON:  05/18/2022 FINDINGS: Lower chest: No acute abnormality Hepatobiliary: No focal hepatic abnormality. Gallbladder unremarkable. Pancreas: No focal abnormality or ductal dilatation. Spleen: No focal abnormality.  Normal size. Adrenals/Urinary Tract: Horseshoe kidney. Punctate 1 mm stone in the lower pole of the right kidney. No ureteral stones or hydronephrosis. Multiple bladder wall diverticula are noted. There is high-density material within the bladder lumen and the posterior diverticula compatible with blood clot. Stomach/Bowel: Sigmoid diverticulosis. No active diverticulitis. Stomach and small bowel decompressed, unremarkable. Vascular/Lymphatic: Aortic atherosclerosis. No evidence of aneurysm or adenopathy. Reproductive: Prostate enlargement. Other: No free fluid or free air. Musculoskeletal: No acute bony abnormality. IMPRESSION: Horseshoe kidney. No ureteral stones or hydronephrosis. Stable punctate right lower pole nephrolithiasis. Blood clot seen within the urinary bladder. Multiple posterior and left lateral bladder wall diverticula. Consider further evaluation with cystoscopy. Sigmoid diverticulosis.  No active diverticulitis. Aortic atherosclerosis. Electronically Signed   By: Charlett Nose M.D.   On: 03/25/2023 23:24    Microbiology: No results found for this or any previous visit (from the past 240 hour(s)).   Labs: CBC: Recent Labs  Lab 03/25/23 2334 03/26/23 0612 03/27/23 0956  WBC 10.5 7.6 7.4  NEUTROABS 6.7  --   --   HGB 10.5* 10.4* 11.7*  HCT 33.1* 32.6* 37.9*  MCV 83.8 83.6 84.8  PLT 249 237 260   Basic Metabolic Panel: Recent Labs  Lab 03/25/23 2334 03/26/23 0612 03/27/23 0956  NA 134* 134* 132*  K 3.6 3.7  4.2  CL 105 104 102  CO2 21* 23 23  GLUCOSE 111* 109* 131*  BUN 12 11 12   CREATININE 0.72 0.62 0.75  CALCIUM 8.2* 8.2* 8.5*  MG  --  1.7  --   PHOS  --  3.4  --    Liver Function Tests: Recent Labs  Lab 03/25/23 2334 03/26/23 0612  AST 21 18  ALT 19 18  ALKPHOS 77 76  BILITOT 0.5 0.8  PROT 6.3* 6.0*  ALBUMIN 3.7 3.6   No results for input(s): "LIPASE", "AMYLASE" in the last 168 hours. No results for input(s): "AMMONIA" in the last 168 hours. Cardiac Enzymes: No results for input(s): "CKTOTAL", "CKMB", "CKMBINDEX", "TROPONINI" in the last 168 hours. BNP (last 3 results) No results for input(s): "BNP" in the last 8760 hours. CBG: Recent Labs  Lab 03/26/23 1154 03/26/23 1529 03/26/23 2234 03/27/23 0734 03/27/23 1202  GLUCAP 88 97 136* 115* 143*    Time spent: 35 minutes  Signed:  Gillis Santa  [facility]  03/27/2023 12:17 PM

## 2023-03-27 NOTE — Progress Notes (Signed)
Mobility Specialist - Progress Note   03/27/23 0943  Mobility  Activity Ambulated independently in hallway  Level of Assistance Independent  Assistive Device None  Distance Ambulated (ft) 500 ft  Activity Response Tolerated well  Mobility Referral Yes  $Mobility charge 1 Mobility   Pt received in recliner and agreeable to mobility. No complaints during session. Pt to recliner after session with all needs met.    [facility]

## 2023-03-29 ENCOUNTER — Ambulatory Visit (INDEPENDENT_AMBULATORY_CARE_PROVIDER_SITE_OTHER): Payer: PPO | Admitting: Urology

## 2023-03-29 VITALS — BP 127/76 | HR 85

## 2023-03-29 DIAGNOSIS — R310 Gross hematuria: Secondary | ICD-10-CM | POA: Diagnosis not present

## 2023-03-29 DIAGNOSIS — C672 Malignant neoplasm of lateral wall of bladder: Secondary | ICD-10-CM | POA: Diagnosis not present

## 2023-03-29 MED ORDER — CIPROFLOXACIN HCL 500 MG PO TABS
500.0000 mg | ORAL_TABLET | Freq: Once | ORAL | Status: DC
Start: 2023-03-29 — End: 2023-04-04

## 2023-03-30 LAB — URINALYSIS, ROUTINE W REFLEX MICROSCOPIC
Bilirubin, UA: NEGATIVE
Glucose, UA: NEGATIVE
Nitrite, UA: NEGATIVE
Protein,UA: NEGATIVE
RBC, UA: NEGATIVE
Specific Gravity, UA: 1.02 (ref 1.005–1.030)
Urobilinogen, Ur: 2 mg/dL — ABNORMAL HIGH (ref 0.2–1.0)
pH, UA: 6 (ref 5.0–7.5)

## 2023-03-30 LAB — MICROSCOPIC EXAMINATION: Bacteria, UA: NONE SEEN

## 2023-04-04 ENCOUNTER — Ambulatory Visit (INDEPENDENT_AMBULATORY_CARE_PROVIDER_SITE_OTHER): Payer: PPO

## 2023-04-04 DIAGNOSIS — R399 Unspecified symptoms and signs involving the genitourinary system: Secondary | ICD-10-CM

## 2023-04-04 LAB — URINALYSIS, ROUTINE W REFLEX MICROSCOPIC
Bilirubin, UA: NEGATIVE
Glucose, UA: NEGATIVE
Nitrite, UA: POSITIVE — AB
Protein,UA: NEGATIVE
RBC, UA: NEGATIVE
Specific Gravity, UA: 1.02 (ref 1.005–1.030)
Urobilinogen, Ur: 0.2 mg/dL (ref 0.2–1.0)
pH, UA: 7 (ref 5.0–7.5)

## 2023-04-04 LAB — MICROSCOPIC EXAMINATION

## 2023-04-04 MED ORDER — SULFAMETHOXAZOLE-TRIMETHOPRIM 800-160 MG PO TABS: 1.0000 | ORAL_TABLET | Freq: Two times a day (BID) | ORAL | 0 refills | Status: DC

## 2023-04-04 NOTE — Progress Notes (Signed)
Patient presents today with complaints of UTI symptoms.  UA and Culture done today.  Dr. Shellia Carwin reviewed results and gave verbal to start patient on Bactrim BID for 7 days.  Patient aware of MD recommendations and that we will reach out with culture results.      Guss Bunde, CMA

## 2023-04-05 ENCOUNTER — Ambulatory Visit: Payer: PPO

## 2023-04-05 ENCOUNTER — Encounter: Payer: Self-pay | Admitting: Urology

## 2023-04-05 NOTE — Patient Instructions (Signed)

## 2023-04-05 NOTE — Progress Notes (Signed)
   04/05/23  CC: gross hematuria  HPI: Mr Rackers is a 72yo here for cystoscopy for gross hematuria Blood pressure 127/76, pulse 85. NED. A&Ox3.   No respiratory distress   Abd soft, NT, ND Normal phallus with bilateral descended testicles  Cystoscopy Procedure Note  Patient identification was confirmed, informed consent was obtained, and patient was prepped using Betadine solution.  Lidocaine jelly was administered per urethral meatus.     Pre-Procedure: - Inspection reveals a normal caliber ureteral meatus.  Procedure: The flexible cystoscope was introduced without difficulty - No urethral strictures/lesions are present. - Enlarged prostate  - Normal bladder neck - Bilateral ureteral orifices identified - Bladder mucosa  reveals no ulcers, tumors, or lesions - No bladder stones - No trabeculation   Post-Procedure: - Patient tolerated the procedure well  Assessment/ Plan: Followup 6 months for cystoscopy  No follow-ups on file.  Wilkie Aye, MD

## 2023-04-07 DIAGNOSIS — Z08 Encounter for follow-up examination after completed treatment for malignant neoplasm: Secondary | ICD-10-CM | POA: Diagnosis not present

## 2023-04-07 DIAGNOSIS — Z85828 Personal history of other malignant neoplasm of skin: Secondary | ICD-10-CM | POA: Diagnosis not present

## 2023-04-08 LAB — URINE CULTURE

## 2023-04-12 ENCOUNTER — Telehealth: Payer: Self-pay | Admitting: Cardiovascular Disease

## 2023-04-12 NOTE — Telephone Encounter (Signed)
Pt c/o medication issue:  1. Name of Medication:  clopidogrel (PLAVIX) 75 MG tablet  aspirin EC 81 MG tablet   2. How are you currently taking this medication (dosage and times per day)?   As prescribed  3. Are you having a reaction (difficulty breathing--STAT)?   No  4. What is your medication issue?   Caller is following up on Dr. Cato Mulligan call to Dr. Elease Hashimoto regarding patient's need to still be on clopidogrel (PLAVIX) 75 MG tablet and aspirin EC 81 MG tablet.  Caller stated Dr. Lynann Beaver wants to know if patient can just be on Aspirin.

## 2023-04-12 NOTE — Telephone Encounter (Signed)
Called and left message for [place].  Patient has not been seen by our providers in a few years, he is re-establishing care with Dr. Elease Hashimoto on 05/11/23.   Will forward to Dr. Elease Hashimoto, and Dr. Excell Seltzer (who performed past heart caths) to review and advise.

## 2023-04-12 NOTE — Telephone Encounter (Signed)
I used to follow him regularly.  Looks like I have not seen him since the pandemic.  I reviewed his chart and I had previously recommended that he stay on dual antiplatelet therapy because of multiple ACS presentations in the past.  Now that he is having problems with hematuria, it would be reasonable for him to stop clopidogrel and stay on aspirin 81 mg daily.  I am happy to continue to follow him if he would like.

## 2023-04-19 DIAGNOSIS — H353221 Exudative age-related macular degeneration, left eye, with active choroidal neovascularization: Secondary | ICD-10-CM | POA: Diagnosis not present

## 2023-04-19 DIAGNOSIS — H2513 Age-related nuclear cataract, bilateral: Secondary | ICD-10-CM | POA: Diagnosis not present

## 2023-04-19 DIAGNOSIS — E119 Type 2 diabetes mellitus without complications: Secondary | ICD-10-CM | POA: Diagnosis not present

## 2023-04-19 DIAGNOSIS — H353113 Nonexudative age-related macular degeneration, right eye, advanced atrophic without subfoveal involvement: Secondary | ICD-10-CM | POA: Diagnosis not present

## 2023-04-19 DIAGNOSIS — H43813 Vitreous degeneration, bilateral: Secondary | ICD-10-CM | POA: Diagnosis not present

## 2023-04-19 DIAGNOSIS — H35033 Hypertensive retinopathy, bilateral: Secondary | ICD-10-CM | POA: Diagnosis not present

## 2023-04-19 NOTE — Telephone Encounter (Signed)
Called and spoke with wife and patient via speakerphone who verbalized understanding to stop Clopidogrel and remain on Aspirin 81mg  daily. Will keep current appt with Nahser next month.

## 2023-04-26 DIAGNOSIS — I10 Essential (primary) hypertension: Secondary | ICD-10-CM | POA: Diagnosis not present

## 2023-04-26 DIAGNOSIS — R319 Hematuria, unspecified: Secondary | ICD-10-CM | POA: Diagnosis not present

## 2023-04-26 DIAGNOSIS — C679 Malignant neoplasm of bladder, unspecified: Secondary | ICD-10-CM | POA: Diagnosis not present

## 2023-04-26 DIAGNOSIS — E1169 Type 2 diabetes mellitus with other specified complication: Secondary | ICD-10-CM | POA: Diagnosis not present

## 2023-04-26 DIAGNOSIS — I700 Atherosclerosis of aorta: Secondary | ICD-10-CM | POA: Diagnosis not present

## 2023-04-26 DIAGNOSIS — I2510 Atherosclerotic heart disease of native coronary artery without angina pectoris: Secondary | ICD-10-CM | POA: Diagnosis not present

## 2023-04-26 DIAGNOSIS — E7849 Other hyperlipidemia: Secondary | ICD-10-CM | POA: Diagnosis not present

## 2023-05-01 ENCOUNTER — Other Ambulatory Visit: Payer: Self-pay | Admitting: Urology

## 2023-05-01 DIAGNOSIS — N200 Calculus of kidney: Secondary | ICD-10-CM

## 2023-05-10 ENCOUNTER — Encounter: Payer: Self-pay | Admitting: Cardiovascular Disease

## 2023-05-10 NOTE — Progress Notes (Unsigned)
Cardiology Office Note:    Date:  05/11/2023   ID:  Kyle, Lambert Jul 17, 1951, MRN [id number]  PCP:  Juliette Alcide, MD   [facility] Providers Cardiologist:  Tonny Bollman, MD     Referring MD: Juliette Alcide, MD   Chief Complaint  Patient presents with   Coronary Artery Disease         History of Present Illness:     Seen with wife , [name]    Kyle Lambert is a 72 y.o. male with a hx of CAD Is a former patient of Dr. Earmon Phoenix.  Was last seen in 2020 He has a history of coronary artery disease with DES to the RCA in 2013.  He also has a DES to the mid LAD and balloon angioplasty to the first diagonal.  Heart catheterization in March, 2020 revealed patent stents in the LAD and RCA with moderate residual restenosis in the small to medium sized first diagonal vessel.  His primary MD recommended that he be seen again  Is some left sided chest pain These are not similar to his angina  Does not having to take SL NTG  Sharp pain ,  lasts for 10-12 seconds  Not with activity Started hurting yesterday while driving his car  Does not get much exercise Walks around in the yard, perhaps 1/2 mile each day   Used to walk 2 miles a day , now he gets out of breath when walking  Avoids salt for the most part  Was having hematura last month He stopped the plavix and continued ASA  I'll have him restart the plavix Hold ASA  Will get an echo to evaluate his DOE  [name] to evaluate his left chest pain     Past Medical History:  Diagnosis Date   Angina pectoris (HCC)    Arthritis    Bladder cancer (HCC) 03/2020   Bladder carcinoma (HCC)    BMI 35.0-35.9,adult    CAD (coronary artery disease)    a. 03/2012 Cath: LAD 80, RCA 80, nl EF;  b. PCI of RCA w 3.0x12mm Promus DES, FFR of LAD was nl @ 0.85 ->Med Rx.;  c. LHC 02/15/13: proximal LAD 40-50%, mid LAD 95%, small D1 95-99%, mid CFX 30-40%, proximal RCA stent patent with ostial 20-30% prior to  the stent, ostial PDA 50%, EF 55-65%. PCI:  Promus DES to the mid LAD and POBA to the proximal D1    Chest pain    COVID    DM (diabetes mellitus) (HCC)    Elevated TSH    Headache    History of angina    Hypercholesterolemia    Hyperlipidemia    Hypertension    Hypotension    Kidney stone    Knee pain    Low back pain    Morbid obesity (HCC)    OA (osteoarthritis)    Obesity    OSA (obstructive sleep apnea)    Sepsis (HCC)    Skin lesion    Tension headache    Tinnitus, bilateral    Umbilical hernia    UTI (urinary tract infection)     Past Surgical History:  Procedure Laterality Date   CORONARY ANGIOPLASTY WITH STENT PLACEMENT  04/24/12   DES-RCA   CORONARY ANGIOPLASTY WITH STENT PLACEMENT  02/15/13   Patent RCA stent, diffuse nonobstructive R PDA, LCx disease, 99% mid LAD stenosis s/p DES-mid LAD; LVEF 60%   CORONARY STENT PLACEMENT  CYSTOSCOPY W/ RETROGRADES  04/07/2020   Procedure: CYSTOSCOPY WITH LEFT RETROGRADE PYELOGRAM;  Surgeon: Malen Gauze, MD;  Location: [facility];  Service: Urology;;   CYSTOSCOPY WITH BIOPSY  04/07/2020   Procedure: CYSTOSCOPY WITH BLADDER BIOPSY;  Surgeon: Malen Gauze, MD;  Location: [facility];  Service: Urology;;   Bluford Kaufmann WITH FULGERATION  04/07/2020   Procedure: Bluford Kaufmann WITH FULGERATION;  Surgeon: Malen Gauze, MD;  Location: [facility];  Service: Urology;;   CYSTOSCOPY/URETEROSCOPY/HOLMIUM LASER/STENT PLACEMENT Left 04/07/2020   Procedure: CYSTOSCOPY/LEFT URETEROSCOPY/HOLMIUM LASER LITHOTRIPSY/LEFT URETERAL STENT PLACEMENT;  Surgeon: Malen Gauze, MD;  Location: [facility];  Service: Urology;  Laterality: Left;   HERNIA REPAIR Left inguinal   LEFT HEART CATH AND CORONARY ANGIOGRAPHY N/A 01/31/2019   Procedure: LEFT HEART CATH AND CORONARY ANGIOGRAPHY;  Surgeon: Tonny Bollman, MD;  Location: [facility] INVASIVE CV LAB;  Service: Cardiovascular;  Laterality: N/A;   LEFT HEART CATHETERIZATION WITH CORONARY ANGIOGRAM N/A 04/21/2012    Procedure: LEFT HEART CATHETERIZATION WITH CORONARY ANGIOGRAM;  Surgeon: Herby Abraham, MD;  Location: [facility] CATH LAB;  Service: Cardiovascular;  Laterality: N/A;   LEFT HEART CATHETERIZATION WITH CORONARY ANGIOGRAM N/A 02/15/2013   Procedure: LEFT HEART CATHETERIZATION WITH CORONARY ANGIOGRAM;  Surgeon: Tonny Bollman, MD;  Location: [facility] CATH LAB;  Service: Cardiovascular;  Laterality: N/A;   LITHOTRIPSY     PERCUTANEOUS CORONARY STENT INTERVENTION (PCI-S) N/A 04/24/2012   Procedure: PERCUTANEOUS CORONARY STENT INTERVENTION (PCI-S);  Surgeon: Tonny Bollman, MD;  Location: [facility] CATH LAB;  Service: Cardiovascular;  Laterality: N/A;   PERCUTANEOUS CORONARY STENT INTERVENTION (PCI-S)  02/15/2013   Procedure: PERCUTANEOUS CORONARY STENT INTERVENTION (PCI-S);  Surgeon: Tonny Bollman, MD;  Location: [facility] CATH LAB;  Service: Cardiovascular;;    Current Medications: Current Meds  Medication Sig   acetaminophen (TYLENOL) 500 MG tablet Take 1,000 mg by mouth every 6 (six) hours as needed for mild pain, moderate pain or headache.    amLODipine (NORVASC) 5 MG tablet TAKE 1 TABLET DAILY (Patient taking differently: Take 5 mg by mouth daily.)   aspirin EC 81 MG tablet Take 81 mg by mouth daily.   atorvastatin (LIPITOR) 40 MG tablet Take 40 mg by mouth at bedtime.    cyclobenzaprine (FLEXERIL) 10 MG tablet Take 10 mg by mouth 3 (three) times daily as needed for muscle spasms.   fesoterodine (TOVIAZ) 4 MG TB24 tablet Take 4 mg by mouth daily.   gabapentin (NEURONTIN) 300 MG capsule Take 1 pill at bedtime for one week, then increase to 1 pill twice a day for one week, then increase to 1 pill three times a day (Patient taking differently: Take 300 mg by mouth 3 (three) times daily.)   isosorbide mononitrate (IMDUR) 120 MG 24 hr tablet Take 1 tablet (120 mg total) by mouth daily.   lisinopril (PRINIVIL,ZESTRIL) 20 MG tablet Take 20 mg daily by mouth.   metFORMIN (GLUCOPHAGE) 1000 MG tablet Take 1,000 mg by mouth 2  (two) times daily.   metoprolol tartrate (LOPRESSOR) 25 MG tablet TAKE 1 TABLET TWICE A DAY (Patient taking differently: Take 25 mg by mouth daily.)   Multiple Vitamins-Minerals (PRESERVISION AREDS 2 PO) Take 1 tablet by mouth in the morning and at bedtime.   nitroGLYCERIN (NITROSTAT) 0.4 MG SL tablet Place 1 tablet (0.4 mg total) under the tongue every 5 (five) minutes x 3 doses as needed for chest pain.   pantoprazole (PROTONIX) 40 MG tablet TAKE 1 TABLET BY MOUTH ONCE DAILY --  NEEDS  APPOINTMENT  FOR  FURTHER  REFILLS (Patient taking differently: Take 40 mg by mouth daily.)   sulfamethoxazole-trimethoprim (BACTRIM DS) 800-160 MG tablet Take 1 tablet by mouth 2 (two) times daily.   tamsulosin (FLOMAX) 0.4 MG CAPS capsule TAKE 1 CAPSULE BY MOUTH IN THE MORNING AND AT BEDTIME     Allergies:   Patient has no known allergies.   Social History   Socioeconomic History   Marital status: Married    Spouse name: [name]   Number of children: 1   Years of education: Not on file   Highest education level: Not on file  Occupational History   Not on file  Tobacco Use   Smoking status: Never   Smokeless tobacco: Never  Vaping Use   Vaping Use: Never used  Substance and Sexual Activity   Alcohol use: No    Comment: " quit along time ago"   Drug use: No   Sexual activity: Not Currently  Other Topics Concern   Not on file  Social History Narrative   Lives with wife, dgtr, 2 grandsons live with   Social Determinants of Health   Financial Resource Strain: Not on file  Food Insecurity: No Food Insecurity (03/26/2023)   Hunger Vital Sign    Worried About Running Out of Food in the Last Year: Never true    Ran Out of Food in the Last Year: Never true  Transportation Needs: No Transportation Needs (03/26/2023)   PRAPARE - Administrator, Civil Service (Medical): No    Lack of Transportation (Non-Medical): No  Physical Activity: Not on file  Stress: Not on file  Social Connections:  Not on file     Family History: The patient's family history includes Cancer in his mother; Lung cancer in his father; Rheum arthritis in his mother; Stroke in his brother.  ROS:   Please see the history of present illness.     All other systems reviewed and are negative.  EKGs/Labs/Other Studies Reviewed:    The following studies were reviewed today:       Recent Labs: 03/26/2023: ALT 18; Magnesium 1.7 03/27/2023: BUN 12; Creatinine, Ser 0.75; Hemoglobin 11.7; Platelets 260; Potassium 4.2; Sodium 132  Recent Lipid Panel No results found for: "CHOL", "TRIG", "HDL", "CHOLHDL", "VLDL", "LDLCALC", "LDLDIRECT"   Risk Assessment/Calculations:                Physical Exam:    VS:  BP 126/72   Pulse 96   Ht 5\' 9"  (1.753 m)   Wt 242 lb (109.8 kg)   SpO2 97%   BMI 35.74 kg/m     Wt Readings from Last 3 Encounters:  05/11/23 242 lb (109.8 kg)  03/26/23 234 lb 2.1 oz (106.2 kg)  02/27/23 243 lb (110.2 kg)     GEN:  Well nourished, well developed in no acute distress HEENT: Normal NECK: No JVD; No carotid bruits LYMPHATICS: No lymphadenopathy CARDIAC: RRR, no murmurs, rubs, gallops RESPIRATORY:  Clear to auscultation without rales, wheezing or rhonchi  ABDOMEN: Soft, non-tender, non-distended MUSCULOSKELETAL:  No edema; No deformity  SKIN: Warm and dry NEUROLOGIC:  Alert and oriented x 3 PSYCHIATRIC:  Normal affect   ASSESSMENT:    1. Coronary artery disease involving native coronary artery of native heart without angina pectoris    PLAN:       CAD:    Patient has been having some episodes of chest discomfort.  He has had several stenting procedures.  Although his symptoms are  somewhat atypical I think it would be useful to go ahead and get a [employer] study for further evaluation. 2.  Hypertension: Continue current medications  3.  Hyperlipidemia: Continue atorvastatin 40 mg a day.  I will see him back in the office in 3 to 4 months.           Informed Consent   Shared Decision Making/Informed Consent{   The risks [chest pain, shortness of breath, cardiac arrhythmias, dizziness, blood pressure fluctuations, myocardial infarction, stroke/transient ischemic attack, nausea, vomiting, allergic reaction, radiation exposure, metallic taste sensation and life-threatening complications (estimated to be 1 in 10,000)], benefits (risk stratification, diagnosing coronary artery disease, treatment guidance) and alternatives of a nuclear stress test were discussed in detail with Kyle Lambert and he agrees to proceed.       Medication Adjustments/Labs and Tests Ordered: Current medicines are reviewed at length with the patient today.  Concerns regarding medicines are outlined above.  Orders Placed This Encounter  Procedures   EKG 12-Lead   No orders of the defined types were placed in this encounter.   There are no Patient Instructions on file for this visit.   Signed, Kristeen Miss, MD  05/11/2023 2:35 PM    [facility]

## 2023-05-11 ENCOUNTER — Encounter: Payer: Self-pay | Admitting: Cardiovascular Disease

## 2023-05-11 ENCOUNTER — Ambulatory Visit: Payer: PPO | Attending: Cardiovascular Disease | Admitting: Cardiovascular Disease

## 2023-05-11 VITALS — BP 126/72 | HR 96 | Ht 69.0 in | Wt 242.0 lb

## 2023-05-11 DIAGNOSIS — R079 Chest pain, unspecified: Secondary | ICD-10-CM | POA: Diagnosis not present

## 2023-05-11 DIAGNOSIS — I200 Unstable angina: Secondary | ICD-10-CM

## 2023-05-11 DIAGNOSIS — I25110 Atherosclerotic heart disease of native coronary artery with unstable angina pectoris: Secondary | ICD-10-CM | POA: Diagnosis not present

## 2023-05-11 DIAGNOSIS — E782 Mixed hyperlipidemia: Secondary | ICD-10-CM

## 2023-05-11 DIAGNOSIS — I2510 Atherosclerotic heart disease of native coronary artery without angina pectoris: Secondary | ICD-10-CM

## 2023-05-11 MED ORDER — NITROGLYCERIN 0.4 MG SL SUBL
0.4000 mg | SUBLINGUAL_TABLET | SUBLINGUAL | 3 refills | Status: AC | PRN
Start: 2023-05-11 — End: ?

## 2023-05-11 MED ORDER — CLOPIDOGREL BISULFATE 75 MG PO TABS: 75.0000 mg | ORAL_TABLET | Freq: Every day | ORAL | 3 refills | Status: DC

## 2023-05-11 NOTE — Patient Instructions (Signed)
Medication Instructions:  START Plavix (Clopidogrel) 75mg  daily STOP Aspirin REFILLED Nitroglycerin *If you need a refill on your cardiac medications before your next appointment, please call your pharmacy*  Lab Work: NONE If you have labs (blood work) drawn today and your tests are completely normal, you will receive your results only by: MyChart Message (if you have MyChart) OR A paper copy in the mail If you have any lab test that is abnormal or we need to change your treatment, we will call you to review the results.  Testing/Procedures: [name] Stress test Your physician has requested that you have a lexiscan myoview. For further information please visit [email or web address] Please follow instruction sheet, as given.  ECHO Your physician has requested that you have an echocardiogram. Echocardiography is a painless test that uses sound waves to create images of your heart. It provides your doctor with information about the size and shape of your heart and how well your heart's chambers and valves are working. This procedure takes approximately one hour. There are no restrictions for this procedure. Please do NOT wear cologne, perfume, aftershave, or lotions (deodorant is allowed). Please arrive 15 minutes prior to your appointment time.  Follow-Up: At [facility], you and your health needs are our priority.  As part of our continuing mission to provide you with exceptional heart care, we have created designated Provider Care Teams.  These Care Teams include your primary Cardiologist (physician) and Advanced Practice Providers (APPs -  Physician Assistants and Nurse Practitioners) who all work together to provide you with the care you need, when you need it.  We recommend signing up for the patient portal called "MyChart".  Sign up information is provided on this After Visit Summary.  MyChart is used to connect with patients for Virtual Visits (Telemedicine).  Patients  are able to view lab/test results, encounter notes, upcoming appointments, etc.  Non-urgent messages can be sent to your provider as well.   To learn more about what you can do with MyChart, go to [email or web address].    Your next appointment:   4 month(s)  Provider:   Kristeen Miss, MD

## 2023-06-02 ENCOUNTER — Telehealth (HOSPITAL_COMMUNITY): Payer: Self-pay

## 2023-06-02 NOTE — Telephone Encounter (Signed)
Spoke with the patient, detailed instructions given. He stated that he would be here for his test. Asked to call back with any questions. Kyle Lambert EMTP/CCT 

## 2023-06-07 ENCOUNTER — Ambulatory Visit (HOSPITAL_BASED_OUTPATIENT_CLINIC_OR_DEPARTMENT_OTHER): Payer: PPO

## 2023-06-07 ENCOUNTER — Ambulatory Visit (HOSPITAL_COMMUNITY): Payer: PPO | Attending: Cardiovascular Disease

## 2023-06-07 DIAGNOSIS — I2510 Atherosclerotic heart disease of native coronary artery without angina pectoris: Secondary | ICD-10-CM | POA: Insufficient documentation

## 2023-06-07 DIAGNOSIS — R079 Chest pain, unspecified: Secondary | ICD-10-CM | POA: Diagnosis not present

## 2023-06-07 LAB — ECHOCARDIOGRAM COMPLETE
Area-P 1/2: 3.72 cm2
Height: 69 in
S' Lateral: 3.5 cm
Weight: 3872 oz

## 2023-06-07 LAB — MYOCARDIAL PERFUSION IMAGING
LV dias vol: 75 mL (ref 62–150)
LV sys vol: 23 mL
Nuc Stress EF: 69 %
Peak HR: 97 {beats}/min
Rest HR: 68 {beats}/min
Rest Nuclear Isotope Dose: 10.7 mCi
SDS: 0
SRS: 0
SSS: 0
ST Depression (mm): 0 mm
Stress Nuclear Isotope Dose: 33 mCi
TID: 0.92

## 2023-06-07 MED ORDER — PERFLUTREN LIPID MICROSPHERE
1.0000 mL | INTRAVENOUS | Status: AC | PRN
Start: 2023-06-07 — End: 2023-06-07
  Administered 2023-06-07: 2 mL via INTRAVENOUS

## 2023-06-07 MED ORDER — TECHNETIUM TC 99M TETROFOSMIN IV KIT
10.7000 | PACK | Freq: Once | INTRAVENOUS | Status: AC | PRN
  Administered 2023-06-07: 10.7 via INTRAVENOUS

## 2023-06-07 MED ORDER — TECHNETIUM TC 99M TETROFOSMIN IV KIT
33.0000 | PACK | Freq: Once | INTRAVENOUS | Status: AC | PRN
  Administered 2023-06-07: 33 via INTRAVENOUS

## 2023-06-07 MED ORDER — REGADENOSON 0.4 MG/5ML IV SOLN
0.4000 mg | Freq: Once | INTRAVENOUS | Status: AC
Start: 2023-06-07 — End: 2023-06-07
  Administered 2023-06-07: 0.4 mg via INTRAVENOUS

## 2023-06-15 ENCOUNTER — Ambulatory Visit: Payer: PPO | Admitting: Urology

## 2023-06-15 VITALS — BP 138/76 | HR 70

## 2023-06-15 DIAGNOSIS — R300 Dysuria: Secondary | ICD-10-CM | POA: Diagnosis not present

## 2023-06-15 DIAGNOSIS — Z87440 Personal history of urinary (tract) infections: Secondary | ICD-10-CM | POA: Diagnosis not present

## 2023-06-15 DIAGNOSIS — C672 Malignant neoplasm of lateral wall of bladder: Secondary | ICD-10-CM

## 2023-06-15 DIAGNOSIS — N3000 Acute cystitis without hematuria: Secondary | ICD-10-CM

## 2023-06-15 LAB — URINALYSIS, ROUTINE W REFLEX MICROSCOPIC
Bilirubin, UA: NEGATIVE
Glucose, UA: NEGATIVE
Nitrite, UA: POSITIVE — AB
Protein,UA: NEGATIVE
Specific Gravity, UA: 1.02 (ref 1.005–1.030)
Urobilinogen, Ur: 1 mg/dL (ref 0.2–1.0)
pH, UA: 6.5 (ref 5.0–7.5)

## 2023-06-15 LAB — MICROSCOPIC EXAMINATION: WBC, UA: >30 /hpf — AB (ref 0–5)

## 2023-06-15 MED ORDER — SULFAMETHOXAZOLE-TRIMETHOPRIM 800-160 MG PO TABS: 1.0000 | ORAL_TABLET | Freq: Two times a day (BID) | ORAL | 0 refills | Status: DC

## 2023-06-15 NOTE — Progress Notes (Signed)
06/15/2023 10:30 AM   Kyle Lambert [birth year] [id number]  Referring provider: Juliette Alcide, MD [address] [place],  [place] [address]  Followup UTi and bladder cancer   HPI: [name] is a 72yo here for followup for frequent UTI and bladder cancer. Over the past several weeks he has noted worsening urinary incontinence and mild dysuria. UA is concerning for infection   PMH: Past Medical History:  Diagnosis Date   Angina pectoris (HCC)    Arthritis    Bladder cancer (HCC) 03/2020   Bladder carcinoma (HCC)    BMI 35.0-35.9,adult    CAD (coronary artery disease)    a. 03/2012 Cath: LAD 80, RCA 80, nl EF;  b. PCI of RCA w 3.0x21mm Promus DES, FFR of LAD was nl @ 0.85 ->Med Rx.;  c. LHC 02/15/13: proximal LAD 40-50%, mid LAD 95%, small D1 95-99%, mid CFX 30-40%, proximal RCA stent patent with ostial 20-30% prior to the stent, ostial PDA 50%, EF 55-65%. PCI:  Promus DES to the mid LAD and POBA to the proximal D1    Chest pain    COVID    DM (diabetes mellitus) (HCC)    Elevated TSH    Headache    History of angina    Hypercholesterolemia    Hyperlipidemia    Hypertension    Hypotension    Kidney stone    Knee pain    Low back pain    Morbid obesity (HCC)    OA (osteoarthritis)    Obesity    OSA (obstructive sleep apnea)    Sepsis (HCC)    Skin lesion    Tension headache    Tinnitus, bilateral    Umbilical hernia    UTI (urinary tract infection)     Surgical History: Past Surgical History:  Procedure Laterality Date   CORONARY ANGIOPLASTY WITH STENT PLACEMENT  04/24/12   DES-RCA   CORONARY ANGIOPLASTY WITH STENT PLACEMENT  02/15/13   Patent RCA stent, diffuse nonobstructive R PDA, LCx disease, 99% mid LAD stenosis s/p DES-mid LAD; LVEF 60%   CORONARY STENT PLACEMENT     CYSTOSCOPY W/ RETROGRADES  04/07/2020   Procedure: CYSTOSCOPY WITH LEFT RETROGRADE PYELOGRAM;  Surgeon: Malen Gauze, MD;  Location: [facility];  Service: Urology;;   CYSTOSCOPY WITH BIOPSY   04/07/2020   Procedure: CYSTOSCOPY WITH BLADDER BIOPSY;  Surgeon: Malen Gauze, MD;  Location: [facility];  Service: Urology;;   Bluford Kaufmann WITH FULGERATION  04/07/2020   Procedure: Bluford Kaufmann WITH FULGERATION;  Surgeon: Malen Gauze, MD;  Location: [facility];  Service: Urology;;   CYSTOSCOPY/URETEROSCOPY/HOLMIUM LASER/STENT PLACEMENT Left 04/07/2020   Procedure: CYSTOSCOPY/LEFT URETEROSCOPY/HOLMIUM LASER LITHOTRIPSY/LEFT URETERAL STENT PLACEMENT;  Surgeon: Malen Gauze, MD;  Location: [facility];  Service: Urology;  Laterality: Left;   HERNIA REPAIR Left inguinal   LEFT HEART CATH AND CORONARY ANGIOGRAPHY N/A 01/31/2019   Procedure: LEFT HEART CATH AND CORONARY ANGIOGRAPHY;  Surgeon: Tonny Bollman, MD;  Location: [facility] INVASIVE CV [facility];  Service: Cardiovascular;  Laterality: N/A;   LEFT HEART CATHETERIZATION WITH CORONARY ANGIOGRAM N/A 04/21/2012   Procedure: LEFT HEART CATHETERIZATION WITH CORONARY ANGIOGRAM;  Surgeon: Herby Abraham, MD;  Location: [facility] CATH LAB;  Service: Cardiovascular;  Laterality: N/A;   LEFT HEART CATHETERIZATION WITH CORONARY ANGIOGRAM N/A 02/15/2013   Procedure: LEFT HEART CATHETERIZATION WITH CORONARY ANGIOGRAM;  Surgeon: Tonny Bollman, MD;  Location: [facility] CATH LAB;  Service: Cardiovascular;  Laterality: N/A;   LITHOTRIPSY     PERCUTANEOUS  CORONARY STENT INTERVENTION (PCI-S) N/A 04/24/2012   Procedure: PERCUTANEOUS CORONARY STENT INTERVENTION (PCI-S);  Surgeon: Tonny Bollman, MD;  Location: [facility] CATH LAB;  Service: Cardiovascular;  Laterality: N/A;   PERCUTANEOUS CORONARY STENT INTERVENTION (PCI-S)  02/15/2013   Procedure: PERCUTANEOUS CORONARY STENT INTERVENTION (PCI-S);  Surgeon: Tonny Bollman, MD;  Location: [facility] CATH LAB;  Service: Cardiovascular;;    Home Medications:  Allergies as of 06/15/2023   No Known Allergies      Medication List        Accurate as of June 15, 2023 10:30 AM. If you have any questions, ask your nurse or doctor.           acetaminophen 500 MG tablet Commonly known as: TYLENOL Take 1,000 mg by mouth every 6 (six) hours as needed for mild pain, moderate pain or headache.   amLODipine 5 MG tablet Commonly known as: NORVASC TAKE 1 TABLET DAILY   atorvastatin 40 MG tablet Commonly known as: LIPITOR Take 40 mg by mouth at bedtime.   clopidogrel 75 MG tablet Commonly known as: PLAVIX Take 1 tablet (75 mg total) by mouth daily.   cyclobenzaprine 10 MG tablet Commonly known as: FLEXERIL Take 10 mg by mouth 3 (three) times daily as needed for muscle spasms.   fesoterodine 4 MG Tb24 tablet Commonly known as: TOVIAZ Take 4 mg by mouth daily.   gabapentin 300 MG capsule Commonly known as: Neurontin Take 1 pill at bedtime for one week, then increase to 1 pill twice a day for one week, then increase to 1 pill three times a day What changed:  how much to take how to take this when to take this additional instructions   isosorbide mononitrate 120 MG 24 hr tablet Commonly known as: Imdur Take 1 tablet (120 mg total) by mouth daily.   lisinopril 20 MG tablet Commonly known as: ZESTRIL Take 20 mg daily by mouth.   metFORMIN 1000 MG tablet Commonly known as: GLUCOPHAGE Take 1,000 mg by mouth 2 (two) times daily.   metoprolol tartrate 25 MG tablet Commonly known as: LOPRESSOR TAKE 1 TABLET TWICE A DAY What changed: when to take this   nitroGLYCERIN 0.4 MG SL tablet Commonly known as: NITROSTAT Place 1 tablet (0.4 mg total) under the tongue every 5 (five) minutes x 3 doses as needed for chest pain.   pantoprazole 40 MG tablet Commonly known as: PROTONIX TAKE 1 TABLET BY MOUTH ONCE DAILY --  NEEDS  APPOINTMENT  FOR  FURTHER  REFILLS What changed: See the new instructions.   PRESERVISION AREDS 2 PO Take 1 tablet by mouth in the morning and at bedtime.   sulfamethoxazole-trimethoprim 800-160 MG tablet Commonly known as: BACTRIM DS Take 1 tablet by mouth 2 (two) times daily.   tamsulosin 0.4  MG Caps capsule Commonly known as: FLOMAX TAKE 1 CAPSULE BY MOUTH IN THE MORNING AND AT BEDTIME        Allergies: No Known Allergies  Family History: Family History  Problem Relation Age of Onset   Cancer Mother    Rheum arthritis Mother    Lung cancer Father    Stroke Brother     Social History:  reports that he has never smoked. He has never used smokeless tobacco. He reports that he does not drink alcohol and does not use drugs.  ROS: All other review of systems were reviewed and are negative except what is noted above in HPI  Physical Exam: BP 138/76   Pulse 70  Constitutional:  Alert and oriented, No acute distress. HEENT: [place] AT, moist mucus membranes.  Trachea midline, no masses. Cardiovascular: No clubbing, cyanosis, or edema. Respiratory: Normal respiratory effort, no increased work of breathing. GI: Abdomen is soft, nontender, nondistended, no abdominal masses GU: No CVA tenderness.  Lymph: No cervical or inguinal lymphadenopathy. Skin: No rashes, bruises or suspicious lesions. Neurologic: Grossly intact, no focal deficits, moving all 4 extremities. Psychiatric: Normal mood and affect.  Laboratory Data: Lab Results  Component Value Date   WBC 7.4 03/27/2023   HGB 11.7 (L) 03/27/2023   HCT 37.9 (L) 03/27/2023   MCV 84.8 03/27/2023   PLT 260 03/27/2023    Lab Results  Component Value Date   CREATININE 0.75 03/27/2023    No results found for: "PSA"  No results found for: "TESTOSTERONE"  Lab Results  Component Value Date   HGBA1C 6.4 (H) 03/26/2023    Urinalysis    Component Value Date/Time   COLORURINE RED (A) 03/25/2023 2255   APPEARANCEUR Clear 04/04/2023 0935   LABSPEC  03/25/2023 2255    TEST NOT REPORTED DUE TO COLOR INTERFERENCE OF URINE PIGMENT   PHURINE  03/25/2023 2255    TEST NOT REPORTED DUE TO COLOR INTERFERENCE OF URINE PIGMENT   GLUCOSEU Negative 04/04/2023 0935   HGBUR NEGATIVE 03/25/2023 2255   BILIRUBINUR Negative  04/04/2023 0935   KETONESUR NEGATIVE 03/25/2023 2255   PROTEINUR Negative 04/04/2023 0935   PROTEINUR NEGATIVE 03/25/2023 2255   UROBILINOGEN 0.2 04/14/2020 1004   UROBILINOGEN 2.0 (H) 04/22/2012 1905   NITRITE Positive (A) 04/04/2023 0935   NITRITE NEGATIVE 03/25/2023 2255   LEUKOCYTESUR 1+ (A) 04/04/2023 0935   LEUKOCYTESUR NEGATIVE 03/25/2023 2255    Lab Results  Component Value Date   LABMICR See below: 04/04/2023   WBCUA 11-30 (A) 04/04/2023   LABEPIT 0-10 04/04/2023   MUCUS Present 05/21/2022   BACTERIA Many (A) 04/04/2023    Pertinent Imaging: *** Results for orders placed during the hospital encounter of 06/09/20  DG Abd 1 View  Narrative CLINICAL DATA:  Right-sided flank pain.  EXAM: ABDOMEN - 1 VIEW  COMPARISON:  June 29, 2007  FINDINGS: The bowel gas pattern is normal. Large amount of formed stool throughout the colon no radio-opaque calculi or other significant radiographic abnormality are seen.  IMPRESSION: 1. Nonobstructive bowel gas pattern. 2. Constipation. 3. No radiopaque renal calculi identified.   Electronically Signed By: Ted Mcalpine M.D. On: 06/10/2020 16:01  No results found for this or any previous visit.  No results found for this or any previous visit.  No results found for this or any previous visit.  Results for orders placed during the hospital encounter of 12/19/20  Ultrasound renal complete  Narrative CLINICAL DATA:  Nephrolithiasis, history bladder cancer  EXAM: RENAL / URINARY TRACT ULTRASOUND COMPLETE  COMPARISON:  06/09/2020  Correlation: CT abdomen and pelvis 02/01/2020  FINDINGS: Right Kidney:  Renal measurements: 15.0 x 9.2 x 7.6 cm = volume: 542 mL. Suboptimally visualized due to body habitus and horseshoe morphology. No mass or hydronephrosis. Nonshadowing echogenic focus 5 mm diameter at mid kidney, nonspecific. No shadowing calculi visualized.  Left Kidney:  Renal measurements: 13.7 x  6.6 x 5.2 cm = volume: 244 mL. Suboptimally visualized due to body habitus and horseshoe morphology. Cortical thinning. Normal cortical echogenicity. No mass, hydronephrosis or shadowing calcification.  Bladder:  Appears normal for degree of bladder distention. BILATERAL ureteral jets visualized. Small posterior wall bladder diverticulum.  Other:  None.  IMPRESSION:  Horseshoe kidney without gross mass or hydronephrosis.  Small bladder diverticulum.   Electronically Signed By: Ulyses Southward M.D. On: 12/19/2020 18:17  No valid procedures specified. No results found for this or any previous visit.  Results for orders placed during the hospital encounter of 03/25/23  CT Renal Stone Study  Narrative CLINICAL DATA:  Abdominal/flank pain, stone suspected  EXAM: CT ABDOMEN AND PELVIS WITHOUT CONTRAST  TECHNIQUE: Multidetector CT imaging of the abdomen and pelvis was performed following the standard protocol without IV contrast.  RADIATION DOSE REDUCTION: This exam was performed according to the departmental dose-optimization program which includes automated exposure control, adjustment of the mA and/or kV according to patient size and/or use of iterative reconstruction technique.  COMPARISON:  05/18/2022  FINDINGS: Lower chest: No acute abnormality  Hepatobiliary: No focal hepatic abnormality. Gallbladder unremarkable.  Pancreas: No focal abnormality or ductal dilatation.  Spleen: No focal abnormality.  Normal size.  Adrenals/Urinary Tract: Horseshoe kidney. Punctate 1 mm stone in the lower pole of the right kidney. No ureteral stones or hydronephrosis. Multiple bladder wall diverticula are noted. There is high-density material within the bladder lumen and the posterior diverticula compatible with blood clot.  Stomach/Bowel: Sigmoid diverticulosis. No active diverticulitis. Stomach and small bowel decompressed, unremarkable.  Vascular/Lymphatic: Aortic  atherosclerosis. No evidence of aneurysm or adenopathy.  Reproductive: Prostate enlargement.  Other: No free fluid or free air.  Musculoskeletal: No acute bony abnormality.  IMPRESSION: Horseshoe kidney. No ureteral stones or hydronephrosis. Stable punctate right lower pole nephrolithiasis. Blood clot seen within the urinary bladder. Multiple posterior and left lateral bladder wall diverticula. Consider further evaluation with cystoscopy.  Sigmoid diverticulosis.  No active diverticulitis.  Aortic atherosclerosis.   Electronically Signed By: Charlett Nose M.D. On: 03/25/2023 23:24   Assessment & Plan:    1. Malignant neoplasm of lateral wall of urinary bladder (HCC) Followup 1 week for cystoscopy - Urinalysis, Routine w reflex microscopic - Urine Culture  2. Acute cystitis without hematuria Urine for culture Bactrim DS BID for 7 days   No follow-ups on file.  Wilkie Aye, MD  [facility] [place]

## 2023-06-21 ENCOUNTER — Encounter: Payer: Self-pay | Admitting: Urology

## 2023-06-21 NOTE — Patient Instructions (Signed)
Urinary Tract Infection, Adult  A urinary tract infection (UTI) is an infection of any part of the urinary tract. The urinary tract includes the kidneys, ureters, bladder, and urethra. These organs make, store, and get rid of urine in the body. An upper UTI affects the ureters and kidneys. A lower UTI affects the bladder and urethra. What are the causes? Most urinary tract infections are caused by bacteria in your genital area around your urethra, where urine leaves your body. These bacteria grow and cause inflammation of your urinary tract. What increases the risk? You are more likely to develop this condition if: You have a urinary catheter that stays in place. You are not able to control when you urinate or have a bowel movement (incontinence). You are male and you: Use a spermicide or diaphragm for birth control. Have low estrogen levels. Are pregnant. You have certain genes that increase your risk. You are sexually active. You take antibiotic medicines. You have a condition that causes your flow of urine to slow down, such as: An enlarged prostate, if you are male. Blockage in your urethra. A kidney stone. A nerve condition that affects your bladder control (neurogenic bladder). Not getting enough to drink, or not urinating often. You have certain medical conditions, such as: Diabetes. A weak disease-fighting system (immunesystem). Sickle cell disease. Gout. Spinal cord injury. What are the signs or symptoms? Symptoms of this condition include: Needing to urinate right away (urgency). Frequent urination. This may include small amounts of urine each time you urinate. Pain or burning with urination. Blood in the urine. Urine that smells bad or unusual. Trouble urinating. Cloudy urine. Vaginal discharge, if you are male. Pain in the abdomen or the lower back. You may also have: Vomiting or a decreased appetite. Confusion. Irritability or tiredness. A fever or  chills. Diarrhea. The first symptom in older adults may be confusion. In some cases, they may not have any symptoms until the infection has worsened. How is this diagnosed? This condition is diagnosed based on your medical history and a physical exam. You may also have other tests, including: Urine tests. Blood tests. Tests for STIs (sexually transmitted infections). If you have had more than one UTI, a cystoscopy or imaging studies may be done to determine the cause of the infections. How is this treated? Treatment for this condition includes: Antibiotic medicine. Over-the-counter medicines to treat discomfort. Drinking enough water to stay hydrated. If you have frequent infections or have other conditions such as a kidney stone, you may need to see a health care provider who specializes in the urinary tract (urologist). In rare cases, urinary tract infections can cause sepsis. Sepsis is a life-threatening condition that occurs when the body responds to an infection. Sepsis is treated in the hospital with IV antibiotics, fluids, and other medicines. Follow these instructions at home:  Medicines Take over-the-counter and prescription medicines only as told by your health care provider. If you were prescribed an antibiotic medicine, take it as told by your health care provider. Do not stop using the antibiotic even if you start to feel better. General instructions Make sure you: Empty your bladder often and completely. Do not hold urine for long periods of time. Empty your bladder after sex. Wipe from front to back after urinating or having a bowel movement if you are male. Use each tissue only one time when you wipe. Drink enough fluid to keep your urine pale yellow. Keep all follow-up visits. This is important. Contact a health   care provider if: Your symptoms do not get better after 1-2 days. Your symptoms go away and then return. Get help right away if: You have severe pain in  your back or your lower abdomen. You have a fever or chills. You have nausea or vomiting. Summary A urinary tract infection (UTI) is an infection of any part of the urinary tract, which includes the kidneys, ureters, bladder, and urethra. Most urinary tract infections are caused by bacteria in your genital area. Treatment for this condition often includes antibiotic medicines. If you were prescribed an antibiotic medicine, take it as told by your health care provider. Do not stop using the antibiotic even if you start to feel better. Keep all follow-up visits. This is important. This information is not intended to replace advice given to you by your health care provider. Make sure you discuss any questions you have with your health care provider. Document Revised: 06/15/2020 Document Reviewed: 06/20/2020 Elsevier Patient Education  2024 [employer]  

## 2023-06-22 ENCOUNTER — Ambulatory Visit: Payer: PPO | Admitting: Urology

## 2023-06-22 VITALS — BP 153/93 | HR 88

## 2023-06-22 DIAGNOSIS — C672 Malignant neoplasm of lateral wall of bladder: Secondary | ICD-10-CM

## 2023-06-22 DIAGNOSIS — Z8551 Personal history of malignant neoplasm of bladder: Secondary | ICD-10-CM

## 2023-06-22 LAB — URINALYSIS, ROUTINE W REFLEX MICROSCOPIC
Bilirubin, UA: NEGATIVE
Glucose, UA: NEGATIVE
Nitrite, UA: NEGATIVE
Protein,UA: NEGATIVE
RBC, UA: NEGATIVE
Specific Gravity, UA: 1.02 (ref 1.005–1.030)
Urobilinogen, Ur: 1 mg/dL (ref 0.2–1.0)
pH, UA: 6 (ref 5.0–7.5)

## 2023-06-22 LAB — MICROSCOPIC EXAMINATION: Bacteria, UA: NONE SEEN

## 2023-06-22 MED ORDER — CIPROFLOXACIN HCL 500 MG PO TABS
500.0000 mg | ORAL_TABLET | Freq: Once | ORAL | Status: AC
Start: 2023-06-22 — End: 2023-06-22
  Administered 2023-06-22: 500 mg via ORAL

## 2023-06-22 NOTE — Progress Notes (Signed)
   06/22/23  CC: followup bladder cancer   HPI: Kyle Lambert is a 72yo here for followup for bladder cancer Blood pressure (!) 153/93, pulse 88. NED. A&Ox3.   No respiratory distress   Abd soft, NT, ND Normal phallus with bilateral descended testicles  Cystoscopy Procedure Note  Patient identification was confirmed, informed consent was obtained, and patient was prepped using Betadine solution.  Lidocaine jelly was administered per urethral meatus.     Pre-Procedure: - Inspection reveals a normal caliber ureteral meatus.  Procedure: The flexible cystoscope was introduced without difficulty - No urethral strictures/lesions are present. - Enlarged prostate  - Normal bladder neck - Bilateral ureteral orifices identified - Bladder mucosa  reveals no ulcers, tumors, or lesions - No bladder stones - No trabeculation     Post-Procedure: - Patient tolerated the procedure well  Assessment/ Plan: Followup 6 months for cystoscopy  No follow-ups on file.  Wilkie Aye, MD

## 2023-07-12 DIAGNOSIS — H353133 Nonexudative age-related macular degeneration, bilateral, advanced atrophic without subfoveal involvement: Secondary | ICD-10-CM | POA: Diagnosis not present

## 2023-07-12 DIAGNOSIS — H2513 Age-related nuclear cataract, bilateral: Secondary | ICD-10-CM | POA: Diagnosis not present

## 2023-07-12 DIAGNOSIS — E119 Type 2 diabetes mellitus without complications: Secondary | ICD-10-CM | POA: Diagnosis not present

## 2023-07-12 DIAGNOSIS — H43813 Vitreous degeneration, bilateral: Secondary | ICD-10-CM | POA: Diagnosis not present

## 2023-07-12 DIAGNOSIS — H353221 Exudative age-related macular degeneration, left eye, with active choroidal neovascularization: Secondary | ICD-10-CM | POA: Diagnosis not present

## 2023-07-12 DIAGNOSIS — H35033 Hypertensive retinopathy, bilateral: Secondary | ICD-10-CM | POA: Diagnosis not present

## 2023-07-14 ENCOUNTER — Encounter: Payer: Self-pay | Admitting: Urology

## 2023-07-14 NOTE — Patient Instructions (Signed)

## 2023-07-27 ENCOUNTER — Other Ambulatory Visit: Payer: Self-pay | Admitting: Urology

## 2023-07-27 DIAGNOSIS — N200 Calculus of kidney: Secondary | ICD-10-CM

## 2023-08-02 DIAGNOSIS — H353124 Nonexudative age-related macular degeneration, left eye, advanced atrophic with subfoveal involvement: Secondary | ICD-10-CM | POA: Diagnosis not present

## 2023-09-08 ENCOUNTER — Encounter: Payer: Self-pay | Admitting: Cardiovascular Disease

## 2023-09-08 DIAGNOSIS — E7849 Other hyperlipidemia: Secondary | ICD-10-CM | POA: Diagnosis not present

## 2023-09-08 DIAGNOSIS — I10 Essential (primary) hypertension: Secondary | ICD-10-CM | POA: Diagnosis not present

## 2023-09-08 DIAGNOSIS — R946 Abnormal results of thyroid function studies: Secondary | ICD-10-CM | POA: Diagnosis not present

## 2023-09-08 DIAGNOSIS — Z0001 Encounter for general adult medical examination with abnormal findings: Secondary | ICD-10-CM | POA: Diagnosis not present

## 2023-09-08 DIAGNOSIS — E1169 Type 2 diabetes mellitus with other specified complication: Secondary | ICD-10-CM | POA: Diagnosis not present

## 2023-09-08 NOTE — Progress Notes (Signed)
Cardiology Office Note:    Date:  09/08/2023   ID:  Kyle, Lambert Jan 02, 1951, MRN [id number]  PCP:  Juliette Alcide, MD   [facility] Providers Cardiologist:  Kristeen Miss, MD     Referring MD: Juliette Alcide, MD   Chief Complaint  Patient presents with   Coronary Artery Disease         History of Present Illness:     Seen with wife , [name]    Kyle Lambert is a 72 y.o. male with a hx of CAD Is a former patient of Dr. Earmon Phoenix.  Was last seen in 2020 He has a history of coronary artery disease with DES to the RCA in 2013.  He also has a DES to the mid LAD and balloon angioplasty to the first diagonal.  Heart catheterization in March, 2020 revealed patent stents in the LAD and RCA with moderate residual restenosis in the small to medium sized first diagonal vessel.  His primary MD recommended that he be seen again  Is some left sided chest pain These are not similar to his angina  Does not having to take SL NTG  Sharp pain ,  lasts for 10-12 seconds  Not with activity Started hurting yesterday while driving his car  Does not get much exercise Walks around in the yard, perhaps 1/2 mile each day   Used to walk 2 miles a day , now he gets out of breath when walking  Avoids salt for the most part  Was having hematura last month He stopped the plavix and continued ASA  I'll have him restart the plavix Hold ASA  Will get an echo to evaluate his DOE  [name] to evaluate his left chest pain    Oct. 21, 2024 Battista is seen for follow up of his CAD  Former patient of Kyle Lambert, has had several PCIs Reported some chest pain when I saw him several months ago Echo reveals normal LV function with EF 60-65% Trivial MR  Myoview study was normal .  No ischemia,         Past Medical History:  Diagnosis Date   Angina pectoris (HCC)    Arthritis    Bladder cancer (HCC) 03/2020   Bladder carcinoma (HCC)    BMI 35.0-35.9,adult    CAD  (coronary artery disease)    a. 03/2012 Cath: LAD 80, RCA 80, nl EF;  b. PCI of RCA w 3.0x22mm Promus DES, FFR of LAD was nl @ 0.85 ->Med Rx.;  c. LHC 02/15/13: proximal LAD 40-50%, mid LAD 95%, small D1 95-99%, mid CFX 30-40%, proximal RCA stent patent with ostial 20-30% prior to the stent, ostial PDA 50%, EF 55-65%. PCI:  Promus DES to the mid LAD and POBA to the proximal D1    Chest pain    COVID    DM (diabetes mellitus) (HCC)    Elevated TSH    Headache    History of angina    Hypercholesterolemia    Hyperlipidemia    Hypertension    Hypotension    Kidney stone    Knee pain    Low back pain    Morbid obesity (HCC)    OA (osteoarthritis)    Obesity    OSA (obstructive sleep apnea)    Sepsis (HCC)    Skin lesion    Tension headache    Tinnitus, bilateral    Umbilical hernia    UTI (urinary tract infection)  Past Surgical History:  Procedure Laterality Date   CORONARY ANGIOPLASTY WITH STENT PLACEMENT  04/24/12   DES-RCA   CORONARY ANGIOPLASTY WITH STENT PLACEMENT  02/15/13   Patent RCA stent, diffuse nonobstructive R PDA, LCx disease, 99% mid LAD stenosis s/p DES-mid LAD; LVEF 60%   CORONARY STENT PLACEMENT     CYSTOSCOPY W/ RETROGRADES  04/07/2020   Procedure: CYSTOSCOPY WITH LEFT RETROGRADE PYELOGRAM;  Surgeon: Malen Gauze, MD;  Location: [facility];  Service: Urology;;   CYSTOSCOPY WITH BIOPSY  04/07/2020   Procedure: CYSTOSCOPY WITH BLADDER BIOPSY;  Surgeon: Malen Gauze, MD;  Location: [facility];  Service: Urology;;   Bluford Kaufmann WITH FULGERATION  04/07/2020   Procedure: Bluford Kaufmann WITH FULGERATION;  Surgeon: Malen Gauze, MD;  Location: [facility];  Service: Urology;;   CYSTOSCOPY/URETEROSCOPY/HOLMIUM LASER/STENT PLACEMENT Left 04/07/2020   Procedure: CYSTOSCOPY/LEFT URETEROSCOPY/HOLMIUM LASER LITHOTRIPSY/LEFT URETERAL STENT PLACEMENT;  Surgeon: Malen Gauze, MD;  Location: [facility];  Service: Urology;  Laterality: Left;   HERNIA REPAIR Left inguinal   LEFT  HEART CATH AND CORONARY ANGIOGRAPHY N/A 01/31/2019   Procedure: LEFT HEART CATH AND CORONARY ANGIOGRAPHY;  Surgeon: Tonny Bollman, MD;  Location: [facility] INVASIVE CV LAB;  Service: Cardiovascular;  Laterality: N/A;   LEFT HEART CATHETERIZATION WITH CORONARY ANGIOGRAM N/A 04/21/2012   Procedure: LEFT HEART CATHETERIZATION WITH CORONARY ANGIOGRAM;  Surgeon: Herby Abraham, MD;  Location: [facility] CATH LAB;  Service: Cardiovascular;  Laterality: N/A;   LEFT HEART CATHETERIZATION WITH CORONARY ANGIOGRAM N/A 02/15/2013   Procedure: LEFT HEART CATHETERIZATION WITH CORONARY ANGIOGRAM;  Surgeon: Tonny Bollman, MD;  Location: [facility] CATH LAB;  Service: Cardiovascular;  Laterality: N/A;   LITHOTRIPSY     PERCUTANEOUS CORONARY STENT INTERVENTION (PCI-S) N/A 04/24/2012   Procedure: PERCUTANEOUS CORONARY STENT INTERVENTION (PCI-S);  Surgeon: Tonny Bollman, MD;  Location: [facility] CATH LAB;  Service: Cardiovascular;  Laterality: N/A;   PERCUTANEOUS CORONARY STENT INTERVENTION (PCI-S)  02/15/2013   Procedure: PERCUTANEOUS CORONARY STENT INTERVENTION (PCI-S);  Surgeon: Tonny Bollman, MD;  Location: [facility] CATH LAB;  Service: Cardiovascular;;    Current Medications: No outpatient medications have been marked as taking for the 09/12/23 encounter (Office Visit) with [name], [name] Ping, MD.     Allergies:   Patient has no known allergies.   Social History   Socioeconomic History   Marital status: Married    Spouse name: [name]   Number of children: 1   Years of education: Not on file   Highest education level: Not on file  Occupational History   Not on file  Tobacco Use   Smoking status: Never   Smokeless tobacco: Never  Vaping Use   Vaping status: Never Used  Substance and Sexual Activity   Alcohol use: No    Comment: " quit along time ago"   Drug use: No   Sexual activity: Not Currently  Other Topics Concern   Not on file  Social History Narrative   Lives with wife, dgtr, 2 grandsons live with   Social Determinants  of Health   Financial Resource Strain: Not on file  Food Insecurity: No Food Insecurity (03/26/2023)   Hunger Vital Sign    Worried About Running Out of Food in the Last Year: Never true    Ran Out of Food in the Last Year: Never true  Transportation Needs: No Transportation Needs (03/26/2023)   PRAPARE - Administrator, Civil Service (Medical): No    Lack of Transportation (Non-Medical): No  Physical Activity: Not on file  Stress: Not on file  Social Connections: Not on file     Family History: The patient's family history includes Cancer in his mother; Lung cancer in his father; Rheum arthritis in his mother; Stroke in his brother.  ROS:   Please see the history of present illness.     All other systems reviewed and are negative.  EKGs/Labs/Other Studies Reviewed:    The following studies were reviewed today:       Recent Labs: 03/26/2023: ALT 18; Magnesium 1.7 03/27/2023: BUN 12; Creatinine, Ser 0.75; Hemoglobin 11.7; Platelets 260; Potassium 4.2; Sodium 132  Recent Lipid Panel No results found for: "CHOL", "TRIG", "HDL", "CHOLHDL", "VLDL", "LDLCALC", "LDLDIRECT"   Risk Assessment/Calculations:      No BP recorded.  {Refresh Note OR Click here to enter BP  :1}***         Physical Exam:    Physical Exam: There were no vitals taken for this visit.  No BP recorded.  {Refresh Note OR Click here to enter BP  :1}***    GEN:  Well nourished, well developed in no acute distress HEENT: Normal NECK: No JVD; No carotid bruits LYMPHATICS: No lymphadenopathy CARDIAC: RRR ***, no murmurs, rubs, gallops RESPIRATORY:  Clear to auscultation without rales, wheezing or rhonchi  ABDOMEN: Soft, non-tender, non-distended MUSCULOSKELETAL:  No edema; No deformity  SKIN: Warm and dry NEUROLOGIC:  Alert and oriented x 3    ASSESSMENT:    No diagnosis found.  PLAN:       CAD:      3.  Hyperlipidemia:            Informed Consent          Medication  Adjustments/Labs and Tests Ordered: Current medicines are reviewed at length with the patient today.  Concerns regarding medicines are outlined above.  No orders of the defined types were placed in this encounter.  No orders of the defined types were placed in this encounter.   There are no Patient Instructions on file for this visit.   Signed, Kristeen Miss, MD  09/08/2023 8:31 AM    [facility]

## 2023-09-12 ENCOUNTER — Encounter: Payer: PPO | Admitting: Cardiovascular Disease

## 2023-09-15 DIAGNOSIS — C679 Malignant neoplasm of bladder, unspecified: Secondary | ICD-10-CM | POA: Diagnosis not present

## 2023-09-15 DIAGNOSIS — Z23 Encounter for immunization: Secondary | ICD-10-CM | POA: Diagnosis not present

## 2023-09-15 DIAGNOSIS — I2583 Coronary atherosclerosis due to lipid rich plaque: Secondary | ICD-10-CM | POA: Diagnosis not present

## 2023-09-15 DIAGNOSIS — G4733 Obstructive sleep apnea (adult) (pediatric): Secondary | ICD-10-CM | POA: Diagnosis not present

## 2023-09-15 DIAGNOSIS — I10 Essential (primary) hypertension: Secondary | ICD-10-CM | POA: Diagnosis not present

## 2023-09-15 DIAGNOSIS — E1169 Type 2 diabetes mellitus with other specified complication: Secondary | ICD-10-CM | POA: Diagnosis not present

## 2023-09-15 DIAGNOSIS — R519 Headache, unspecified: Secondary | ICD-10-CM | POA: Diagnosis not present

## 2023-09-15 DIAGNOSIS — E7849 Other hyperlipidemia: Secondary | ICD-10-CM | POA: Diagnosis not present

## 2023-09-15 DIAGNOSIS — R42 Dizziness and giddiness: Secondary | ICD-10-CM | POA: Diagnosis not present

## 2023-09-15 DIAGNOSIS — I700 Atherosclerosis of aorta: Secondary | ICD-10-CM | POA: Diagnosis not present

## 2023-09-15 DIAGNOSIS — Z0001 Encounter for general adult medical examination with abnormal findings: Secondary | ICD-10-CM | POA: Diagnosis not present

## 2023-09-20 ENCOUNTER — Encounter: Payer: Self-pay | Admitting: Urology

## 2023-09-20 ENCOUNTER — Ambulatory Visit: Payer: PPO | Admitting: Urology

## 2023-09-20 DIAGNOSIS — R8290 Unspecified abnormal findings in urine: Secondary | ICD-10-CM | POA: Diagnosis not present

## 2023-09-20 DIAGNOSIS — N401 Enlarged prostate with lower urinary tract symptoms: Secondary | ICD-10-CM | POA: Diagnosis not present

## 2023-09-20 DIAGNOSIS — N390 Urinary tract infection, site not specified: Secondary | ICD-10-CM | POA: Insufficient documentation

## 2023-09-20 DIAGNOSIS — N200 Calculus of kidney: Secondary | ICD-10-CM

## 2023-09-20 DIAGNOSIS — C672 Malignant neoplasm of lateral wall of bladder: Secondary | ICD-10-CM

## 2023-09-20 DIAGNOSIS — N3941 Urge incontinence: Secondary | ICD-10-CM | POA: Diagnosis not present

## 2023-09-20 DIAGNOSIS — R82998 Other abnormal findings in urine: Secondary | ICD-10-CM | POA: Diagnosis not present

## 2023-09-20 DIAGNOSIS — Z8551 Personal history of malignant neoplasm of bladder: Secondary | ICD-10-CM | POA: Diagnosis not present

## 2023-09-20 DIAGNOSIS — R310 Gross hematuria: Secondary | ICD-10-CM | POA: Diagnosis not present

## 2023-09-20 LAB — BLADDER SCAN AMB NON-IMAGING: Scan Result: 160

## 2023-09-20 MED ORDER — NITROFURANTOIN MONOHYD MACRO 100 MG PO CAPS: 100.0000 mg | ORAL_CAPSULE | Freq: Two times a day (BID) | ORAL | 0 refills | Status: AC

## 2023-09-20 MED ORDER — MIRABEGRON ER 25 MG PO TB24: 25.0000 mg | ORAL_TABLET | Freq: Every day | ORAL | 0 refills | Status: DC

## 2023-09-20 NOTE — Progress Notes (Signed)
Name: Kyle Lambert DOB: 13-Nov-1951 MRN: [id number]  History of Present Illness: Kyle Lambert is a 72 y.o. male who presents today for follow up visit at [facility] [place]. - GU history: 1. Bladder cancer. - 04/07/2020: Underwent cystoscopy and bladder biopsy with fulguration by Dr. Ronne Binning concomitantly at time of left ureteroscopic stone manipulation. Pathology: "Early Non-invasive low grade papillary urothelial carcinoma". 2. BPH with LUTS (frequency and nocturia). - Taking Flomax 2x/day.  - Previously tried Secretary/administrator for LUTS. 3. Recurrent UTIs. See culture history below. 4. Kidney stones.  - Has had multiple prior stone procedures.  - 03/25/2023: CT stone study showed stable punctate right lower pole nephrolithiasis. 5. Horseshoe kidney. 6. Multiple posterior and left lateral bladder wall diverticula. Per CT stone study on 03/25/2023.   Urine culture results in past 12 months: - 12/07/2022: Positive for Klebsiella pneumoniae - 04/04/2023: Positive for Klebsiella pneumoniae - 06/15/2023: Positive for Klebsiella pneumoniae  At last visit with Dr. Ronne Binning on 06/22/2023: No acute findings on surveillance cystoscopy. Advised repeat in 6 months.   Today: He reports gross hematuria. He reports that the visible hematuria was first noticed last night around 7pm and states it seems to be nearly resolved.   He denies urinary urgency, frequency, dysuria, straining to void, or sensations of incomplete emptying. He denies abdominal pain, reports bilateral low back pain which he states may be related to some recent heavy lifting. He states it does not feel like his prior kidney stones. He denies fevers.  At baseline he reports chronic urinary incontinence which is significantly bothersome. Seems to be urge incontinence, however sometimes the leakage occurs without awareness. He saturates about 2 pads per day. Reports minimal caffeine intake (1 cup of coffee each morning).     Fall Screening: Do you usually have a device to assist in your mobility? No   Medications: Current Outpatient Medications  Medication Sig Dispense Refill   mirabegron ER (MYRBETRIQ) 25 MG TB24 tablet Take 1 tablet (25 mg total) by mouth daily. 45 tablet 0   nitrofurantoin, macrocrystal-monohydrate, (MACROBID) 100 MG capsule Take 1 capsule (100 mg total) by mouth 2 (two) times daily for 7 days. 14 capsule 0   acetaminophen (TYLENOL) 500 MG tablet Take 1,000 mg by mouth every 6 (six) hours as needed for mild pain, moderate pain or headache.      amLODipine (NORVASC) 5 MG tablet TAKE 1 TABLET DAILY (Patient taking differently: Take 5 mg by mouth daily.) 90 tablet 0   atorvastatin (LIPITOR) 40 MG tablet Take 40 mg by mouth at bedtime.      clopidogrel (PLAVIX) 75 MG tablet Take 1 tablet (75 mg total) by mouth daily. 90 tablet 3   cyclobenzaprine (FLEXERIL) 10 MG tablet Take 10 mg by mouth 3 (three) times daily as needed for muscle spasms.     gabapentin (NEURONTIN) 300 MG capsule Take 1 pill at bedtime for one week, then increase to 1 pill twice a day for one week, then increase to 1 pill three times a day (Patient taking differently: Take 300 mg by mouth 3 (three) times daily.) 90 capsule 6   isosorbide mononitrate (IMDUR) 120 MG 24 hr tablet Take 1 tablet (120 mg total) by mouth daily. 90 tablet 3   lisinopril (PRINIVIL,ZESTRIL) 20 MG tablet Take 20 mg daily by mouth.     metFORMIN (GLUCOPHAGE) 1000 MG tablet Take 1,000 mg by mouth 2 (two) times daily.     metoprolol tartrate (LOPRESSOR) 25 MG  tablet TAKE 1 TABLET TWICE A DAY (Patient taking differently: Take 25 mg by mouth daily.) 60 tablet 0   Multiple Vitamins-Minerals (PRESERVISION AREDS 2 PO) Take 1 tablet by mouth in the morning and at bedtime.     nitroGLYCERIN (NITROSTAT) 0.4 MG SL tablet Place 1 tablet (0.4 mg total) under the tongue every 5 (five) minutes x 3 doses as needed for chest pain. 25 tablet 3   pantoprazole (PROTONIX) 40  MG tablet TAKE 1 TABLET BY MOUTH ONCE DAILY --  NEEDS  APPOINTMENT  FOR  FURTHER  REFILLS (Patient taking differently: Take 40 mg by mouth daily.) 15 tablet 0   tamsulosin (FLOMAX) 0.4 MG CAPS capsule TAKE 1 CAPSULE BY MOUTH IN THE MORNING AND AT BEDTIME 180 capsule 0   No current facility-administered medications for this visit.    Allergies: No Known Allergies  Past Medical History:  Diagnosis Date   Angina pectoris (HCC)    Arthritis    Bladder cancer (HCC) 03/2020   Bladder carcinoma (HCC)    BMI 35.0-35.9,adult    CAD (coronary artery disease)    a. 03/2012 Cath: LAD 80, RCA 80, nl EF;  b. PCI of RCA w 3.0x43mm Promus DES, FFR of LAD was nl @ 0.85 ->Med Rx.;  c. LHC 02/15/13: proximal LAD 40-50%, mid LAD 95%, small D1 95-99%, mid CFX 30-40%, proximal RCA stent patent with ostial 20-30% prior to the stent, ostial PDA 50%, EF 55-65%. PCI:  Promus DES to the mid LAD and POBA to the proximal D1    Chest pain    COVID    DM (diabetes mellitus) (HCC)    Elevated TSH    Headache    History of angina    Hypercholesterolemia    Hyperlipidemia    Hypertension    Hypotension    Kidney stone    Knee pain    Low back pain    Morbid obesity (HCC)    OA (osteoarthritis)    Obesity    OSA (obstructive sleep apnea)    Sepsis (HCC)    Skin lesion    Tension headache    Tinnitus, bilateral    Umbilical hernia    UTI (urinary tract infection)    Past Surgical History:  Procedure Laterality Date   CORONARY ANGIOPLASTY WITH STENT PLACEMENT  04/24/12   DES-RCA   CORONARY ANGIOPLASTY WITH STENT PLACEMENT  02/15/13   Patent RCA stent, diffuse nonobstructive R PDA, LCx disease, 99% mid LAD stenosis s/p DES-mid LAD; LVEF 60%   CORONARY STENT PLACEMENT     CYSTOSCOPY W/ RETROGRADES  04/07/2020   Procedure: CYSTOSCOPY WITH LEFT RETROGRADE PYELOGRAM;  Surgeon: Malen Gauze, MD;  Location: [facility];  Service: Urology;;   CYSTOSCOPY WITH BIOPSY  04/07/2020   Procedure: CYSTOSCOPY WITH BLADDER  BIOPSY;  Surgeon: Malen Gauze, MD;  Location: [facility];  Service: Urology;;   Bluford Kaufmann WITH FULGERATION  04/07/2020   Procedure: Bluford Kaufmann WITH FULGERATION;  Surgeon: Malen Gauze, MD;  Location: [facility];  Service: Urology;;   CYSTOSCOPY/URETEROSCOPY/HOLMIUM LASER/STENT PLACEMENT Left 04/07/2020   Procedure: CYSTOSCOPY/LEFT URETEROSCOPY/HOLMIUM LASER LITHOTRIPSY/LEFT URETERAL STENT PLACEMENT;  Surgeon: Malen Gauze, MD;  Location: [facility];  Service: Urology;  Laterality: Left;   HERNIA REPAIR Left inguinal   LEFT HEART CATH AND CORONARY ANGIOGRAPHY N/A 01/31/2019   Procedure: LEFT HEART CATH AND CORONARY ANGIOGRAPHY;  Surgeon: Tonny Bollman, MD;  Location: [facility] INVASIVE CV LAB;  Service: Cardiovascular;  Laterality: N/A;   LEFT HEART  CATHETERIZATION WITH CORONARY ANGIOGRAM N/A 04/21/2012   Procedure: LEFT HEART CATHETERIZATION WITH CORONARY ANGIOGRAM;  Surgeon: Herby Abraham, MD;  Location: [facility] CATH LAB;  Service: Cardiovascular;  Laterality: N/A;   LEFT HEART CATHETERIZATION WITH CORONARY ANGIOGRAM N/A 02/15/2013   Procedure: LEFT HEART CATHETERIZATION WITH CORONARY ANGIOGRAM;  Surgeon: Tonny Bollman, MD;  Location: [facility] CATH LAB;  Service: Cardiovascular;  Laterality: N/A;   LITHOTRIPSY     PERCUTANEOUS CORONARY STENT INTERVENTION (PCI-S) N/A 04/24/2012   Procedure: PERCUTANEOUS CORONARY STENT INTERVENTION (PCI-S);  Surgeon: Tonny Bollman, MD;  Location: [facility] CATH LAB;  Service: Cardiovascular;  Laterality: N/A;   PERCUTANEOUS CORONARY STENT INTERVENTION (PCI-S)  02/15/2013   Procedure: PERCUTANEOUS CORONARY STENT INTERVENTION (PCI-S);  Surgeon: Tonny Bollman, MD;  Location: [facility] CATH LAB;  Service: Cardiovascular;;   Family History  Problem Relation Age of Onset   Cancer Mother    Rheum arthritis Mother    Lung cancer Father    Stroke Brother    Social History   Socioeconomic History   Marital status: Married    Spouse name: [name]   Number of children: 1   Years of  education: Not on file   Highest education level: Not on file  Occupational History   Not on file  Tobacco Use   Smoking status: Never   Smokeless tobacco: Never  Vaping Use   Vaping status: Never Used  Substance and Sexual Activity   Alcohol use: No    Comment: " quit along time ago"   Drug use: No   Sexual activity: Not Currently  Other Topics Concern   Not on file  Social History Narrative   Lives with wife, dgtr, 2 grandsons live with   Social Determinants of Health   Financial Resource Strain: Not on file  Food Insecurity: No Food Insecurity (03/26/2023)   Hunger Vital Sign    Worried About Running Out of Food in the Last Year: Never true    Ran Out of Food in the Last Year: Never true  Transportation Needs: No Transportation Needs (03/26/2023)   PRAPARE - Administrator, Civil Service (Medical): No    Lack of Transportation (Non-Medical): No  Physical Activity: Not on file  Stress: Not on file  Social Connections: Not on file  Intimate Partner Violence: Not At Risk (03/26/2023)   Humiliation, Afraid, Rape, and Kick questionnaire    Fear of Current or Ex-Partner: No    Emotionally Abused: No    Physically Abused: No    Sexually Abused: No   Review of Systems Constitutional: Patient denies any unintentional weight loss or change in strength lntegumentary: Patient denies any rashes or pruritus Cardiovascular: Patient denies chest pain or syncope Respiratory: Patient denies shortness of breath Gastrointestinal: Patient denies nausea, vomiting, constipation, or diarrhea Musculoskeletal: Patient denies muscle cramps or weakness Neurologic: Patient denies convulsions or seizures Allergic/Immunologic: Patient denies recent allergic reaction(s) Hematologic/Lymphatic: Patient denies bleeding tendencies Endocrine: Patient denies heat/cold intolerance  GU: As per HPI.  OBJECTIVE There were no vitals filed for this visit. There is no height or weight on file to  calculate BMI.  Physical Examination Constitutional: No obvious distress; patient is non-toxic appearing  Cardiovascular: No visible lower extremity edema.  Respiratory: The patient does not have audible wheezing/stridor; respirations do not appear labored  Gastrointestinal: Abdomen non-distended Musculoskeletal: Normal ROM of UEs  Skin: No obvious rashes/open sores  Neurologic: CN 2-12 grossly intact Psychiatric: Answered questions appropriately with normal affect  Hematologic/Lymphatic/Immunologic: No obvious bruises or  sites of spontaneous bleeding  UA: 6-10 WBC/hpf, >30 RBC/hpf, bacteria (many) PVR: 160 ml  ASSESSMENT Gross hematuria - Plan: Urine culture, Urinalysis, Routine w reflex microscopic, BLADDER SCAN AMB NON-IMAGING, nitrofurantoin, macrocrystal-monohydrate, (MACROBID) 100 MG capsule  Abnormal urinalysis - Plan: Urine culture, nitrofurantoin, macrocrystal-monohydrate, (MACROBID) 100 MG capsule  Recurrent UTI - Plan: nitrofurantoin, macrocrystal-monohydrate, (MACROBID) 100 MG capsule  Malignant neoplasm of lateral wall of urinary bladder (HCC)  Benign prostatic hyperplasia with urinary obstruction  Nephrolithiasis  Urge incontinence of urine - Plan: mirabegron ER (MYRBETRIQ) 25 MG TB24 tablet  We discussed his abnormal UA today which suggests UTI as likely etiology for his gross hematuria. Will check urine culture and treat empirically with Macrobid while awaiting culture results and sensitivities.  We discussed his history of recurrent UTls and possible etiologies including ascending infection; transmural infection that has been treated incompletely; urinary tract stones; incomplete bladder emptying with urinary stasis; kidney or bladder tumor; urethral diverticulum. Discussed possible use of a daily low dose antibiotic for UTI prophylaxis if recurrent UTIs persist.  We discussed his chronic urinary incontinence, which seems to be urge incontinence. We discussed  possible contributing factors such as his BPH, T2DM, and age. Possible exacerbated by caffeine intake. Failed prior anticholinergic medications and is not a good candidate for those anyway due to age and potential side effects such as cognitive impairment. He elected to proceed with trial of Myrbetriq 25 mg daily; samples provided. Potential side effects were discussed, particularly regarding the risk for urinary retention. If that fails, may benefit from urodynamic testing to further evaluate & characterize his urinary incontinence.   Will continue Flomax 0.4 mg 2x/day for BPH.   For bladder cancer he will plan to follow up with Dr. Ronne Binning as scheduled on 12/14/2023 for surveillance cystoscopy.   We agreed to follow up in 6 weeks for symptom recheck. Pt verbalized understanding and agreement. All questions were answered.  PLAN Advised the following: 1. Urine culture. 2. Macrobid 100 mg 2x/day x7 days. 3. Start Myrbetriq 25 mg daily. 4. Continue Flomax 0.4 mg 2x/day. 5. Minimize caffeine intake. 6. Return in about 6 weeks (around 11/01/2023) for as previously scheduled with Dr. Ronne Binning, UA, PVR, & f/u with Evette Georges NP.  Orders Placed This Encounter  Procedures   Urine culture   Urinalysis, Routine w reflex microscopic   BLADDER SCAN AMB NON-IMAGING    It has been explained that the patient is to follow regularly with their PCP in addition to all other providers involved in their care and to follow instructions provided by these respective offices. Patient advised to contact urology clinic if any urologic-pertaining questions, concerns, new symptoms or problems arise in the interim period.  There are no Patient Instructions on file for this visit.  Electronically signed by:  Donnita Falls, FNP   09/20/23    4:46 PM

## 2023-09-20 NOTE — Progress Notes (Signed)
post void residual= 160 ?

## 2023-09-21 LAB — URINALYSIS, ROUTINE W REFLEX MICROSCOPIC
Bilirubin, UA: NEGATIVE
Nitrite, UA: POSITIVE — AB
Specific Gravity, UA: 1.025 (ref 1.005–1.030)
Urobilinogen, Ur: 2 mg/dL — ABNORMAL HIGH (ref 0.2–1.0)
pH, UA: 6 (ref 5.0–7.5)

## 2023-09-21 LAB — MICROSCOPIC EXAMINATION: RBC, Urine: >30 /[HPF] — AB (ref 0–2)

## 2023-09-23 LAB — URINE CULTURE

## 2023-09-26 ENCOUNTER — Telehealth: Payer: Self-pay

## 2023-09-26 NOTE — Telephone Encounter (Signed)
Patient aware of NP's response

## 2023-09-26 NOTE — Telephone Encounter (Signed)
-----   Message from Donnita Falls sent at 09/26/2023  8:44 AM EST ----- Please let pt know urine culture result. The Macrobid which was prescribed should cover this pathogen. Thanks.

## 2023-10-04 ENCOUNTER — Encounter: Payer: Self-pay | Admitting: Cardiovascular Disease

## 2023-10-04 ENCOUNTER — Ambulatory Visit: Payer: PPO | Attending: Cardiovascular Disease | Admitting: Cardiovascular Disease

## 2023-10-04 VITALS — BP 104/68 | HR 61 | Ht 69.0 in | Wt 221.6 lb

## 2023-10-04 DIAGNOSIS — R079 Chest pain, unspecified: Secondary | ICD-10-CM

## 2023-10-04 NOTE — Patient Instructions (Signed)
Follow-Up: At [facility], you and your health needs are our priority.  As part of our continuing mission to provide you with exceptional heart care, we have created designated Provider Care Teams.  These Care Teams include your primary Cardiologist (physician) and Advanced Practice Providers (APPs -  Physician Assistants and Nurse Practitioners) who all work together to provide you with the care you need, when you need it.  We recommend signing up for the patient portal called "MyChart".  Sign up information is provided on this After Visit Summary.  MyChart is used to connect with patients for Virtual Visits (Telemedicine).  Patients are able to view lab/test results, encounter notes, upcoming appointments, etc.  Non-urgent messages can be sent to your provider as well.   To learn more about what you can do with MyChart, go to [email or web address].    Your next appointment:   1 year(s)  Provider:   Kristeen Miss, MD

## 2023-10-04 NOTE — Progress Notes (Signed)
Cardiology Office Note:    Date:  10/04/2023   ID:  Kyle Lambert, Kyle Lambert 1951-06-12, MRN [id number]  PCP:  Juliette Alcide, MD   [facility] Providers Cardiologist:  Kristeen Miss, MD     Referring MD: Juliette Alcide, MD   Chief Complaint  Patient presents with   Coronary Artery Disease         History of Present Illness:     Seen with wife , [name]    Kyle Lambert is a 72 y.o. male with a hx of CAD Is a former patient of Dr. Earmon Phoenix.  Was last seen in 2020 He has a history of coronary artery disease with DES to the RCA in 2013.  He also has a DES to the mid LAD and balloon angioplasty to the first diagonal.  Heart catheterization in March, 2020 revealed patent stents in the LAD and RCA with moderate residual restenosis in the small to medium sized first diagonal vessel.  His primary MD recommended that he be seen again  Is some left sided chest pain These are not similar to his angina  Does not having to take SL NTG  Sharp pain ,  lasts for 10-12 seconds  Not with activity Started hurting yesterday while driving his car  Does not get much exercise Walks around in the yard, perhaps 1/2 mile each day   Used to walk 2 miles a day , now he gets out of breath when walking  Avoids salt for the most part  Was having hematura last month He stopped the plavix and continued ASA  I'll have him restart the plavix Hold ASA  Will get an echo to evaluate his DOE  [name] to evaluate his left chest pain    Oct. 21, 2024 No show, patient cancelled  Nov. 12, 2024  [name] is seen for follow up of his CAD  Former patient of Excell Seltzer, has had several PCIs Reported some chest pain when I saw him several months ago Echo reveals normal LV function with EF 60-65% Trivial MR  Myoview study was normal .  No ischemia,   Is having some left sided chest soreness.  Has been present for 2 1/2 weeks Seems to be a muscle ache Not worsened with exercise Does  not feel like his previous heart pain   Lexiscan myoview in July 2024 was normal , low risk    Exercises several times a week  - will walk 1/2 mile several times a day .  Hx of HTN        Past Medical History:  Diagnosis Date   Angina pectoris (HCC)    Arthritis    Bladder cancer (HCC) 03/2020   Bladder carcinoma (HCC)    BMI 35.0-35.9,adult    CAD (coronary artery disease)    a. 03/2012 Cath: LAD 80, RCA 80, nl EF;  b. PCI of RCA w 3.0x18mm Promus DES, FFR of LAD was nl @ 0.85 ->Med Rx.;  c. LHC 02/15/13: proximal LAD 40-50%, mid LAD 95%, small D1 95-99%, mid CFX 30-40%, proximal RCA stent patent with ostial 20-30% prior to the stent, ostial PDA 50%, EF 55-65%. PCI:  Promus DES to the mid LAD and POBA to the proximal D1    Chest pain    COVID    DM (diabetes mellitus) (HCC)    Elevated TSH    Headache    History of angina    Hypercholesterolemia    Hyperlipidemia  Hypertension    Hypotension    Kidney stone    Knee pain    Low back pain    Morbid obesity (HCC)    OA (osteoarthritis)    Obesity    OSA (obstructive sleep apnea)    Sepsis (HCC)    Skin lesion    Tension headache    Tinnitus, bilateral    Umbilical hernia    UTI (urinary tract infection)     Past Surgical History:  Procedure Laterality Date   CORONARY ANGIOPLASTY WITH STENT PLACEMENT  04/24/12   DES-RCA   CORONARY ANGIOPLASTY WITH STENT PLACEMENT  02/15/13   Patent RCA stent, diffuse nonobstructive R PDA, LCx disease, 99% mid LAD stenosis s/p DES-mid LAD; LVEF 60%   CORONARY STENT PLACEMENT     CYSTOSCOPY W/ RETROGRADES  04/07/2020   Procedure: CYSTOSCOPY WITH LEFT RETROGRADE PYELOGRAM;  Surgeon: Malen Gauze, MD;  Location: [facility];  Service: Urology;;   CYSTOSCOPY WITH BIOPSY  04/07/2020   Procedure: CYSTOSCOPY WITH BLADDER BIOPSY;  Surgeon: Malen Gauze, MD;  Location: [facility];  Service: Urology;;   Bluford Kaufmann WITH FULGERATION  04/07/2020   Procedure: Bluford Kaufmann WITH FULGERATION;   Surgeon: Malen Gauze, MD;  Location: [facility];  Service: Urology;;   CYSTOSCOPY/URETEROSCOPY/HOLMIUM LASER/STENT PLACEMENT Left 04/07/2020   Procedure: CYSTOSCOPY/LEFT URETEROSCOPY/HOLMIUM LASER LITHOTRIPSY/LEFT URETERAL STENT PLACEMENT;  Surgeon: Malen Gauze, MD;  Location: [facility];  Service: Urology;  Laterality: Left;   HERNIA REPAIR Left inguinal   LEFT HEART CATH AND CORONARY ANGIOGRAPHY N/A 01/31/2019   Procedure: LEFT HEART CATH AND CORONARY ANGIOGRAPHY;  Surgeon: Tonny Bollman, MD;  Location: [facility] INVASIVE CV LAB;  Service: Cardiovascular;  Laterality: N/A;   LEFT HEART CATHETERIZATION WITH CORONARY ANGIOGRAM N/A 04/21/2012   Procedure: LEFT HEART CATHETERIZATION WITH CORONARY ANGIOGRAM;  Surgeon: Herby Abraham, MD;  Location: [facility] CATH LAB;  Service: Cardiovascular;  Laterality: N/A;   LEFT HEART CATHETERIZATION WITH CORONARY ANGIOGRAM N/A 02/15/2013   Procedure: LEFT HEART CATHETERIZATION WITH CORONARY ANGIOGRAM;  Surgeon: Tonny Bollman, MD;  Location: [facility] CATH LAB;  Service: Cardiovascular;  Laterality: N/A;   LITHOTRIPSY     PERCUTANEOUS CORONARY STENT INTERVENTION (PCI-S) N/A 04/24/2012   Procedure: PERCUTANEOUS CORONARY STENT INTERVENTION (PCI-S);  Surgeon: Tonny Bollman, MD;  Location: [facility] CATH LAB;  Service: Cardiovascular;  Laterality: N/A;   PERCUTANEOUS CORONARY STENT INTERVENTION (PCI-S)  02/15/2013   Procedure: PERCUTANEOUS CORONARY STENT INTERVENTION (PCI-S);  Surgeon: Tonny Bollman, MD;  Location: [facility] CATH LAB;  Service: Cardiovascular;;    Current Medications: Current Meds  Medication Sig   acetaminophen (TYLENOL) 500 MG tablet Take 1,000 mg by mouth every 6 (six) hours as needed for mild pain, moderate pain or headache.    amLODipine (NORVASC) 5 MG tablet TAKE 1 TABLET DAILY (Patient taking differently: Take 5 mg by mouth daily.)   atorvastatin (LIPITOR) 40 MG tablet Take 40 mg by mouth at bedtime.    clopidogrel (PLAVIX) 75 MG tablet Take 1 tablet (75 mg total)  by mouth daily.   cyclobenzaprine (FLEXERIL) 10 MG tablet Take 10 mg by mouth 3 (three) times daily as needed for muscle spasms.   isosorbide mononitrate (IMDUR) 120 MG 24 hr tablet Take 1 tablet (120 mg total) by mouth daily.   lisinopril (PRINIVIL,ZESTRIL) 20 MG tablet Take 20 mg daily by mouth.   metFORMIN (GLUCOPHAGE) 1000 MG tablet Take 1,000 mg by mouth 2 (two) times daily.   metoprolol tartrate (LOPRESSOR) 25 MG tablet TAKE 1 TABLET TWICE A  DAY (Patient taking differently: Take 25 mg by mouth daily.)   mirabegron ER (MYRBETRIQ) 25 MG TB24 tablet Take 1 tablet (25 mg total) by mouth daily.   Multiple Vitamins-Minerals (PRESERVISION AREDS 2 PO) Take 1 tablet by mouth in the morning and at bedtime.   nitroGLYCERIN (NITROSTAT) 0.4 MG SL tablet Place 1 tablet (0.4 mg total) under the tongue every 5 (five) minutes x 3 doses as needed for chest pain.   pantoprazole (PROTONIX) 40 MG tablet TAKE 1 TABLET BY MOUTH ONCE DAILY --  NEEDS  APPOINTMENT  FOR  FURTHER  REFILLS (Patient taking differently: Take 40 mg by mouth daily.)   tamsulosin (FLOMAX) 0.4 MG CAPS capsule TAKE 1 CAPSULE BY MOUTH IN THE MORNING AND AT BEDTIME     Allergies:   Patient has no known allergies.   Social History   Socioeconomic History   Marital status: Married    Spouse name: [name]   Number of children: 1   Years of education: Not on file   Highest education level: Not on file  Occupational History   Not on file  Tobacco Use   Smoking status: Never   Smokeless tobacco: Never  Vaping Use   Vaping status: Never Used  Substance and Sexual Activity   Alcohol use: No    Comment: " quit along time ago"   Drug use: No   Sexual activity: Not Currently  Other Topics Concern   Not on file  Social History Narrative   Lives with wife, dgtr, 2 grandsons live with   Social Determinants of Health   Financial Resource Strain: Not on file  Food Insecurity: No Food Insecurity (03/26/2023)   Hunger Vital Sign    Worried  About Running Out of Food in the Last Year: Never true    Ran Out of Food in the Last Year: Never true  Transportation Needs: No Transportation Needs (03/26/2023)   PRAPARE - Administrator, Civil Service (Medical): No    Lack of Transportation (Non-Medical): No  Physical Activity: Not on file  Stress: Not on file  Social Connections: Not on file     Family History: The patient's family history includes Cancer in his mother; Lung cancer in his father; Rheum arthritis in his mother; Stroke in his brother.  ROS:   Please see the history of present illness.     All other systems reviewed and are negative.  EKGs/Labs/Other Studies Reviewed:    The following studies were reviewed today:  EKG Interpretation Date/Time:  Tuesday October 04 2023 11:45:23 EST Ventricular Rate:  60 PR Interval:  196 QRS Duration:  90 QT Interval:  432 QTC Calculation: 432 R Axis:   33  Text Interpretation: Normal sinus rhythm Normal ECG When compared with ECG of 11-May-2023 14:13, Premature supraventricular complexes are no longer Present Vent. rate has decreased BY  36 BPM Confirmed by Kristeen Miss ([id number]) on 10/04/2023 11:58:31 AM    Recent Labs: 03/26/2023: ALT 18; Magnesium 1.7 03/27/2023: BUN 12; Creatinine, Ser 0.75; Hemoglobin 11.7; Platelets 260; Potassium 4.2; Sodium 132  Recent Lipid Panel No results found for: "CHOL", "TRIG", "HDL", "CHOLHDL", "VLDL", "LDLCALC", "LDLDIRECT"   Risk Assessment/Calculations:                Physical Exam:    Physical Exam: Blood pressure 104/68, pulse 61, height 5\' 9"  (1.753 m), weight 221 lb 9.6 oz (100.5 kg), SpO2 98%.      GEN:  Well nourished, well developed in no  acute distress HEENT: Normal NECK: No JVD; No carotid bruits LYMPHATICS: No lymphadenopathy CARDIAC: RRR , no murmurs, rubs, gallops RESPIRATORY:  Clear to auscultation without rales, wheezing or rhonchi  ABDOMEN: Soft, non-tender, non-distended MUSCULOSKELETAL:  No  edema; No deformity  SKIN: Warm and dry NEUROLOGIC:  Alert and oriented x 3    ASSESSMENT:    1. Chest pain, unspecified type     PLAN:       CAD:      has had some unusual chest ache.   Does not feel like his previous episodes of angina.  The pain does not worsen with exertion.  I have encouraged him to try nitroglycerin to see if that helps.  If the pain worsens he needs to go to the emergency room. He had a low risk Myoview study in July, 2024.  3.  Hyperlipidemia:  stable    Medication Adjustments/Labs and Tests Ordered: Current medicines are reviewed at length with the patient today.  Concerns regarding medicines are outlined above.  Orders Placed This Encounter  Procedures   EKG 12-Lead   No orders of the defined types were placed in this encounter.   Patient Instructions  Follow-Up: At [facility], you and your health needs are our priority.  As part of our continuing mission to provide you with exceptional heart care, we have created designated Provider Care Teams.  These Care Teams include your primary Cardiologist (physician) and Advanced Practice Providers (APPs -  Physician Assistants and Nurse Practitioners) who all work together to provide you with the care you need, when you need it.  We recommend signing up for the patient portal called "MyChart".  Sign up information is provided on this After Visit Summary.  MyChart is used to connect with patients for Virtual Visits (Telemedicine).  Patients are able to view lab/test results, encounter notes, upcoming appointments, etc.  Non-urgent messages can be sent to your provider as well.   To learn more about what you can do with MyChart, go to [email or web address].    Your next appointment:   1 year(s)  Provider:   Kristeen Miss, MD        Signed, Kristeen Miss, MD  10/04/2023 2:12 PM    [facility]

## 2023-10-27 ENCOUNTER — Other Ambulatory Visit: Payer: Self-pay | Admitting: Urology

## 2023-10-27 DIAGNOSIS — N200 Calculus of kidney: Secondary | ICD-10-CM

## 2023-10-31 NOTE — Progress Notes (Unsigned)
Name: JAKOB JUSZCZYK DOB: [birth year] MRN: [id number]  History of Present Illness: Mr. Peric is a 72 y.o. male who presents today for follow up visit at [facility] [place]. - GU history: 1. Bladder cancer. - 04/07/2020: Underwent cystoscopy and bladder biopsy with fulguration by Dr. Ronne Binning concomitantly at time of left ureteroscopic stone manipulation. Pathology: "Early Non-invasive low grade papillary urothelial carcinoma". - 06/22/2023: No acute findings on surveillance cystoscopy by Dr. Ronne Binning.  2. BPH with LUTS (frequency and nocturia). - Taking Flomax 2x/day.  - Previously tried Secretary/administrator for LUTS. 3. Recurrent UTIs. See culture history below. 4. Kidney stones.  - Has had multiple prior stone procedures.  - 03/25/2023: CT stone study showed stable punctate right lower pole nephrolithiasis. 5. Horseshoe kidney. 6. Multiple posterior and left lateral bladder wall diverticula. Per CT stone study on 03/25/2023.    Urine culture results in past 12 months: - 12/07/2022: Positive for Klebsiella pneumoniae - 04/04/2023: Positive for Klebsiella pneumoniae - 06/15/2023: Positive for Klebsiella pneumoniae - 09/20/2023: Positive for E. coli  At last visit on 09/20/2023: - Seen for gross hematuria.  - The plan was:  1. Urine culture (positive for 25-50k E. Coli; pan-sensitive). 2. Macrobid 100 mg 2x/day x7 days. 3. Start Myrbetriq 25 mg daily. 4. Continue Flomax 0.4 mg 2x/day. 5. Minimize caffeine intake. 6. Return in about 6 weeks (around 11/01/2023) for as previously scheduled with UA, PVR, & f/u with Evette Georges NP. 7. Follow up with Dr. Ronne Binning as scheduled on 12/14/2023 for surveillance cystoscopy.    Today: He reports intermittent right flank / low back pain for that past few weeks along with increased urinary urgency and frequency. Denies abdominal pain, fevers, nausea, vomiting, weakness, fatigue. Denies dysuria, gross hematuria, hesitancy, straining to  void, or sensations of incomplete emptying. Prior to this he reports that his urinary symptoms were improved with the Gemtesa 75 mg daily samples, which he states he just ran out of last night. He states his current symptoms don't feel like one of his typical UTIs - usually has chills with those and denies that today.    Fall Screening: Do you usually have a device to assist in your mobility? No   Medications: Current Outpatient Medications  Medication Sig Dispense Refill   acetaminophen (TYLENOL) 500 MG tablet Take 1,000 mg by mouth every 6 (six) hours as needed for mild pain, moderate pain or headache.      amLODipine (NORVASC) 5 MG tablet TAKE 1 TABLET DAILY (Patient taking differently: Take 5 mg by mouth daily.) 90 tablet 0   atorvastatin (LIPITOR) 40 MG tablet Take 40 mg by mouth at bedtime.      clopidogrel (PLAVIX) 75 MG tablet Take 1 tablet (75 mg total) by mouth daily. 90 tablet 3   cyclobenzaprine (FLEXERIL) 10 MG tablet Take 10 mg by mouth 3 (three) times daily as needed for muscle spasms.     gabapentin (NEURONTIN) 300 MG capsule Take 1 pill at bedtime for one week, then increase to 1 pill twice a day for one week, then increase to 1 pill three times a day 90 capsule 6   isosorbide mononitrate (IMDUR) 120 MG 24 hr tablet Take 1 tablet (120 mg total) by mouth daily. 90 tablet 3   lisinopril (PRINIVIL,ZESTRIL) 20 MG tablet Take 20 mg daily by mouth.     metFORMIN (GLUCOPHAGE) 1000 MG tablet Take 1,000 mg by mouth 2 (two) times daily.     metoprolol tartrate (LOPRESSOR) 25  MG tablet TAKE 1 TABLET TWICE A DAY (Patient taking differently: Take 25 mg by mouth daily.) 60 tablet 0   Multiple Vitamins-Minerals (PRESERVISION AREDS 2 PO) Take 1 tablet by mouth in the morning and at bedtime.     nitroGLYCERIN (NITROSTAT) 0.4 MG SL tablet Place 1 tablet (0.4 mg total) under the tongue every 5 (five) minutes x 3 doses as needed for chest pain. 25 tablet 3   pantoprazole (PROTONIX) 40 MG tablet  TAKE 1 TABLET BY MOUTH ONCE DAILY --  NEEDS  APPOINTMENT  FOR  FURTHER  REFILLS (Patient taking differently: Take 40 mg by mouth daily.) 15 tablet 0   tamsulosin (FLOMAX) 0.4 MG CAPS capsule TAKE 1 CAPSULE BY MOUTH IN THE MORNING AND AT BEDTIME 180 capsule 0   Vibegron (GEMTESA) 75 MG TABS Take 1 tablet (75 mg total) by mouth daily. 30 tablet 11   No current facility-administered medications for this visit.    Allergies: No Known Allergies  Past Medical History:  Diagnosis Date   Angina pectoris (HCC)    Arthritis    Bladder cancer (HCC) 03/2020   Bladder carcinoma (HCC)    BMI 35.0-35.9,adult    CAD (coronary artery disease)    a. 03/2012 Cath: LAD 80, RCA 80, nl EF;  b. PCI of RCA w 3.0x38mm Promus DES, FFR of LAD was nl @ 0.85 ->Med Rx.;  c. LHC 02/15/13: proximal LAD 40-50%, mid LAD 95%, small D1 95-99%, mid CFX 30-40%, proximal RCA stent patent with ostial 20-30% prior to the stent, ostial PDA 50%, EF 55-65%. PCI:  Promus DES to the mid LAD and POBA to the proximal D1    Chest pain    COVID    DM (diabetes mellitus) (HCC)    Elevated TSH    Headache    History of angina    Hypercholesterolemia    Hyperlipidemia    Hypertension    Hypotension    Kidney stone    Knee pain    Low back pain    Morbid obesity (HCC)    OA (osteoarthritis)    Obesity    OSA (obstructive sleep apnea)    Sepsis (HCC)    Skin lesion    Tension headache    Tinnitus, bilateral    Umbilical hernia    UTI (urinary tract infection)    Past Surgical History:  Procedure Laterality Date   CORONARY ANGIOPLASTY WITH STENT PLACEMENT  04/24/12   DES-RCA   CORONARY ANGIOPLASTY WITH STENT PLACEMENT  02/15/13   Patent RCA stent, diffuse nonobstructive R PDA, LCx disease, 99% mid LAD stenosis s/p DES-mid LAD; LVEF 60%   CORONARY STENT PLACEMENT     CYSTOSCOPY W/ RETROGRADES  04/07/2020   Procedure: CYSTOSCOPY WITH LEFT RETROGRADE PYELOGRAM;  Surgeon: Malen Gauze, MD;  Location: [facility];  Service:  Urology;;   CYSTOSCOPY WITH BIOPSY  04/07/2020   Procedure: CYSTOSCOPY WITH BLADDER BIOPSY;  Surgeon: Malen Gauze, MD;  Location: [facility];  Service: Urology;;   Bluford Kaufmann WITH FULGERATION  04/07/2020   Procedure: Bluford Kaufmann WITH FULGERATION;  Surgeon: Malen Gauze, MD;  Location: [facility];  Service: Urology;;   CYSTOSCOPY/URETEROSCOPY/HOLMIUM LASER/STENT PLACEMENT Left 04/07/2020   Procedure: CYSTOSCOPY/LEFT URETEROSCOPY/HOLMIUM LASER LITHOTRIPSY/LEFT URETERAL STENT PLACEMENT;  Surgeon: Malen Gauze, MD;  Location: [facility];  Service: Urology;  Laterality: Left;   HERNIA REPAIR Left inguinal   LEFT HEART CATH AND CORONARY ANGIOGRAPHY N/A 01/31/2019   Procedure: LEFT HEART CATH AND CORONARY ANGIOGRAPHY;  Surgeon: Tonny Bollman, MD;  Location: [facility] INVASIVE CV LAB;  Service: Cardiovascular;  Laterality: N/A;   LEFT HEART CATHETERIZATION WITH CORONARY ANGIOGRAM N/A 04/21/2012   Procedure: LEFT HEART CATHETERIZATION WITH CORONARY ANGIOGRAM;  Surgeon: Herby Abraham, MD;  Location: [facility] CATH LAB;  Service: Cardiovascular;  Laterality: N/A;   LEFT HEART CATHETERIZATION WITH CORONARY ANGIOGRAM N/A 02/15/2013   Procedure: LEFT HEART CATHETERIZATION WITH CORONARY ANGIOGRAM;  Surgeon: Tonny Bollman, MD;  Location: [facility] CATH LAB;  Service: Cardiovascular;  Laterality: N/A;   LITHOTRIPSY     PERCUTANEOUS CORONARY STENT INTERVENTION (PCI-S) N/A 04/24/2012   Procedure: PERCUTANEOUS CORONARY STENT INTERVENTION (PCI-S);  Surgeon: Tonny Bollman, MD;  Location: [facility] CATH LAB;  Service: Cardiovascular;  Laterality: N/A;   PERCUTANEOUS CORONARY STENT INTERVENTION (PCI-S)  02/15/2013   Procedure: PERCUTANEOUS CORONARY STENT INTERVENTION (PCI-S);  Surgeon: Tonny Bollman, MD;  Location: [facility] CATH LAB;  Service: Cardiovascular;;   Family History  Problem Relation Age of Onset   Cancer Mother    Rheum arthritis Mother    Lung cancer Father    Stroke Brother    Social History   Socioeconomic History    Marital status: Married    Spouse name: [name]   Number of children: 1   Years of education: Not on file   Highest education level: Not on file  Occupational History   Not on file  Tobacco Use   Smoking status: Never   Smokeless tobacco: Never  Vaping Use   Vaping status: Never Used  Substance and Sexual Activity   Alcohol use: No    Comment: " quit along time ago"   Drug use: No   Sexual activity: Not Currently  Other Topics Concern   Not on file  Social History Narrative   Lives with wife, dgtr, 2 grandsons live with   Social Determinants of Health   Financial Resource Strain: Not on file  Food Insecurity: No Food Insecurity (03/26/2023)   Hunger Vital Sign    Worried About Running Out of Food in the Last Year: Never true    Ran Out of Food in the Last Year: Never true  Transportation Needs: No Transportation Needs (03/26/2023)   PRAPARE - Administrator, Civil Service (Medical): No    Lack of Transportation (Non-Medical): No  Physical Activity: Not on file  Stress: Not on file  Social Connections: Not on file  Intimate Partner Violence: Not At Risk (03/26/2023)   Humiliation, Afraid, Rape, and Kick questionnaire    Fear of Current or Ex-Partner: No    Emotionally Abused: No    Physically Abused: No    Sexually Abused: No    Review of Systems Constitutional: Patient denies any unintentional weight loss or change in strength lntegumentary: Patient denies any rashes or pruritus Cardiovascular: Patient denies chest pain or syncope Respiratory: Patient denies shortness of breath Gastrointestinal: Patient denies nausea, vomiting, constipation, or diarrhea Musculoskeletal: Patient denies muscle cramps or weakness Neurologic: Patient denies convulsions or seizures Allergic/Immunologic: Patient denies recent allergic reaction(s) Hematologic/Lymphatic: Patient denies bleeding tendencies Endocrine: Patient denies heat/cold intolerance  GU: As per  HPI.  OBJECTIVE Vitals:   11/01/23 1555  BP: 124/80  Pulse: 71   There is no height or weight on file to calculate BMI.  Physical Examination Constitutional: No obvious distress; patient is non-toxic appearing  Cardiovascular: No visible lower extremity edema.  Respiratory: The patient does not have audible wheezing/stridor; respirations do not appear labored  Gastrointestinal: Abdomen non-distended Musculoskeletal: Normal ROM of  UEs  Skin: No obvious rashes/open sores  Neurologic: CN 2-12 grossly intact Psychiatric: Answered questions appropriately with normal affect  Hematologic/Lymphatic/Immunologic: No obvious bruises or sites of spontaneous bleeding  UA: 11-30 WBC/hpf, 3-10 RBC/hpf, many bacteria PVR: 214 ml  ASSESSMENT Recurrent UTI - Plan: Urinalysis, Routine w reflex microscopic, BLADDER SCAN AMB NON-IMAGING, Urine culture  Malignant neoplasm of lateral wall of urinary bladder (HCC) - Plan: Urinalysis, Routine w reflex microscopic, BLADDER SCAN AMB NON-IMAGING, DG Abd 1 View, Urine culture  Right flank pain - Plan: DG Abd 1 View, Urine culture  Kidney stones - Plan: DG Abd 1 View  Urge incontinence of urine - Plan: Vibegron (GEMTESA) 75 MG TABS, Urine culture  Urinary frequency - Plan: Vibegron (GEMTESA) 75 MG TABS, Urine culture  Abnormal UA. He is not highly symptomatic for UTI today despite abnormal UA, we agreed to check urine culture and treat as indicated based on results. We discussed that the small right kidney stone seen on his CT in May 2024 may be passing; will obtain KUB today to assess.   He has been pleased with symptom response to Gemtesa samples; will continue that.   Will plan for follow up with Dr. Ronne Binning as scheduled on 12/14/2023 for surveillance cystoscopy or sooner if needed. Pt verbalized understanding and agreement. All questions were answered.  PLAN Advised the following: 1. Urine culture. 2. KUB. 3. Continue Gemtesa 75 mg daily  (samples given today; prescription sent). 4. Return in 6 weeks (on 12/14/2023) for f/u with Dr. Ronne Binning for surveillance cystoscopy as previously scheduled.  Orders Placed This Encounter  Procedures   Urine culture   DG Abd 1 View    Standing Status:   Future    Standing Expiration Date:   10/31/2024    Order Specific Question:   Reason for Exam (SYMPTOM  OR DIAGNOSIS REQUIRED)    Answer:   kidney stone    Order Specific Question:   Preferred imaging location?    Answer:   [facility]   Urinalysis, Routine w reflex microscopic   BLADDER SCAN AMB NON-IMAGING    It has been explained that the patient is to follow regularly with their PCP in addition to all other providers involved in their care and to follow instructions provided by these respective offices. Patient advised to contact urology clinic if any urologic-pertaining questions, concerns, new symptoms or problems arise in the interim period.  Patient Instructions  Recommendations regarding UTI prevention / management:  Options when UTI symptoms occur: 1. Call [facility] [place] to request urgent / same-day visit (phone # [phone]).  2. Call your Primary Care Provider (PCP) office to request urgent / same-day visit. Be sure to request for urine culture to be ordered and have results faxed to Urology (fax # [phone]).  3. Go to urgent care. Be sure to request for urine culture to be ordered and have results faxed to Urology (fax # [phone]).   For bladder pain/ burning with urination: - Can take OTC Pyridium (phenazopyridine; commonly known under the "AZO" brand) for a few days as needed. Limit use to no more than 3 days consecutively due to risk for methemoglobinemia, liver function issues, and bone health damage with long term use of Pyridium.  Routine use for UTI prevention: - Low dose antibiotic daily for UTI prophylaxis. - Adequate daily fluid intake to flush out the urinary tract. - Go to the  bathroom to urinate every 4-6 hours while awake to minimize urinary stasis /  bacterial overgrowth in the bladder. - Proanthocyanidin (PAC) supplement 36 mg daily; must be soluble (insoluble form of PAC will be ineffective). Recommended brand: Ellura. This is an over-the-counter supplement (often must be found/ purchased online) supplement derived from cranberries with concentrated active component: Proanthocyanidin (PAC) 36 mg daily. Decreases bacterial adherence to bladder lining.  - D-mannose powder (2 grams daily). This is an over-the-counter supplement which decreases bacterial adherence to bladder lining (it is a sugar that inhibits bacterial adherence to urothelial cells by binding to the pili of enteric bacteria). Take as per manufacturer recommendation. Can be used as an alternative or in addition to the concentrated cranberry supplement.  - Vitamin C supplement to acidify urine to minimize bacterial growth.   Note for patients with diabetes:  - Be aware that D-mannose contains sugar.   Electronically signed by:  Donnita Falls, FNP   11/01/23    4:29 PM

## 2023-11-01 ENCOUNTER — Ambulatory Visit: Payer: PPO | Admitting: Urology

## 2023-11-01 ENCOUNTER — Ambulatory Visit (HOSPITAL_COMMUNITY)
Admission: RE | Admit: 2023-11-01 | Discharge: 2023-11-01 | Disposition: A | Discharge status: Home / Self Care | Payer: PPO | Source: Ambulatory Visit | Attending: Urology | Admitting: Urology

## 2023-11-01 ENCOUNTER — Encounter: Payer: Self-pay | Admitting: Urology

## 2023-11-01 VITALS — BP 124/80 | HR 71

## 2023-11-01 DIAGNOSIS — N390 Urinary tract infection, site not specified: Secondary | ICD-10-CM | POA: Diagnosis not present

## 2023-11-01 DIAGNOSIS — C672 Malignant neoplasm of lateral wall of bladder: Secondary | ICD-10-CM

## 2023-11-01 DIAGNOSIS — N3941 Urge incontinence: Secondary | ICD-10-CM

## 2023-11-01 DIAGNOSIS — R109 Unspecified abdominal pain: Secondary | ICD-10-CM | POA: Insufficient documentation

## 2023-11-01 DIAGNOSIS — R82998 Other abnormal findings in urine: Secondary | ICD-10-CM | POA: Diagnosis not present

## 2023-11-01 DIAGNOSIS — R350 Frequency of micturition: Secondary | ICD-10-CM | POA: Diagnosis not present

## 2023-11-01 DIAGNOSIS — Z8551 Personal history of malignant neoplasm of bladder: Secondary | ICD-10-CM | POA: Diagnosis not present

## 2023-11-01 DIAGNOSIS — I878 Other specified disorders of veins: Secondary | ICD-10-CM | POA: Diagnosis not present

## 2023-11-01 DIAGNOSIS — N200 Calculus of kidney: Secondary | ICD-10-CM

## 2023-11-01 DIAGNOSIS — Q438 Other specified congenital malformations of intestine: Secondary | ICD-10-CM | POA: Diagnosis not present

## 2023-11-01 LAB — BLADDER SCAN AMB NON-IMAGING: Scan Result: 214

## 2023-11-01 MED ORDER — GEMTESA 75 MG PO TABS: 1.0000 | ORAL_TABLET | Freq: Every day | ORAL | 11 refills | Status: DC

## 2023-11-01 NOTE — Patient Instructions (Signed)
Recommendations regarding UTI prevention / management:  Options when UTI symptoms occur: 1. Call [facility] [place] to request urgent / same-day visit (phone # [phone]).  2. Call your Primary Care Provider (PCP) office to request urgent / same-day visit. Be sure to request for urine culture to be ordered and have results faxed to Urology (fax # [phone]).  3. Go to urgent care. Be sure to request for urine culture to be ordered and have results faxed to Urology (fax # [phone]).   For bladder pain/ burning with urination: - Can take OTC Pyridium (phenazopyridine; commonly known under the "AZO" brand) for a few days as needed. Limit use to no more than 3 days consecutively due to risk for methemoglobinemia, liver function issues, and bone health damage with long term use of Pyridium.  Routine use for UTI prevention: - Low dose antibiotic daily for UTI prophylaxis. - Adequate daily fluid intake to flush out the urinary tract. - Go to the bathroom to urinate every 4-6 hours while awake to minimize urinary stasis / bacterial overgrowth in the bladder. - Proanthocyanidin (PAC) supplement 36 mg daily; must be soluble (insoluble form of PAC will be ineffective). Recommended brand: Ellura. This is an over-the-counter supplement (often must be found/ purchased online) supplement derived from cranberries with concentrated active component: Proanthocyanidin (PAC) 36 mg daily. Decreases bacterial adherence to bladder lining.  - D-mannose powder (2 grams daily). This is an over-the-counter supplement which decreases bacterial adherence to bladder lining (it is a sugar that inhibits bacterial adherence to urothelial cells by binding to the pili of enteric bacteria). Take as per manufacturer recommendation. Can be used as an alternative or in addition to the concentrated cranberry supplement.  - Vitamin C supplement to acidify urine to minimize bacterial growth.   Note for patients with  diabetes:  - Be aware that D-mannose contains sugar.

## 2023-11-02 ENCOUNTER — Telehealth: Payer: Self-pay

## 2023-11-02 LAB — URINALYSIS, ROUTINE W REFLEX MICROSCOPIC
Bilirubin, UA: NEGATIVE
Glucose, UA: NEGATIVE
Nitrite, UA: POSITIVE — AB
Protein,UA: NEGATIVE
Specific Gravity, UA: 1.015 (ref 1.005–1.030)
Urobilinogen, Ur: 1 mg/dL (ref 0.2–1.0)
pH, UA: 6.5 (ref 5.0–7.5)

## 2023-11-02 LAB — MICROSCOPIC EXAMINATION

## 2023-11-02 NOTE — Telephone Encounter (Signed)
-----   Message from Donnita Falls sent at 11/02/2023  8:47 AM EST ----- Please let pt know that Dr. Ronne Binning and I looked at his KUB images this morning. No evidence of stones passing at this time. His recent intermittent right flank / low back pain + increased urinary urgency & frequency may be due to another UTI. Urine culture result is pending. Does he want to start treatment with an antibiotic empirically today or await the urine culture result to guide antibiotic treatment (if UC is positive)?

## 2023-11-02 NOTE — Telephone Encounter (Signed)
Tried calling patient with no answer left voiced message for return call to office.

## 2023-11-04 NOTE — Telephone Encounter (Signed)
Patient and patient's wife was made aware. Patient decided to wait for urine culture before starting antibiotic, patient voiced understanding.Kyle Lambert

## 2023-11-05 LAB — URINE CULTURE

## 2023-11-07 ENCOUNTER — Other Ambulatory Visit: Payer: Self-pay | Admitting: Urology

## 2023-11-07 DIAGNOSIS — N390 Urinary tract infection, site not specified: Secondary | ICD-10-CM

## 2023-11-07 MED ORDER — NITROFURANTOIN MONOHYD MACRO 100 MG PO CAPS: 100.0000 mg | ORAL_CAPSULE | Freq: Two times a day (BID) | ORAL | 0 refills | Status: AC

## 2023-11-08 NOTE — Telephone Encounter (Signed)
Patient was made aware and voiced understanding. Please let pt know urine culture result. I have sent prescription for Macrobid. Thanks.

## 2023-12-07 ENCOUNTER — Ambulatory Visit: Payer: PPO | Admitting: Cardiovascular Disease

## 2023-12-07 DIAGNOSIS — I2583 Coronary atherosclerosis due to lipid rich plaque: Secondary | ICD-10-CM | POA: Diagnosis not present

## 2023-12-07 DIAGNOSIS — Z6834 Body mass index (BMI) 34.0-34.9, adult: Secondary | ICD-10-CM | POA: Diagnosis not present

## 2023-12-07 DIAGNOSIS — H353110 Nonexudative age-related macular degeneration, right eye, stage unspecified: Secondary | ICD-10-CM | POA: Diagnosis not present

## 2023-12-07 DIAGNOSIS — I2510 Atherosclerotic heart disease of native coronary artery without angina pectoris: Secondary | ICD-10-CM | POA: Diagnosis not present

## 2023-12-14 ENCOUNTER — Other Ambulatory Visit: Payer: PPO | Admitting: Urology

## 2024-01-04 ENCOUNTER — Ambulatory Visit: Payer: PPO | Admitting: Urology

## 2024-01-04 VITALS — BP 130/78 | HR 67

## 2024-01-04 DIAGNOSIS — R351 Nocturia: Secondary | ICD-10-CM

## 2024-01-04 DIAGNOSIS — C672 Malignant neoplasm of lateral wall of bladder: Secondary | ICD-10-CM | POA: Diagnosis not present

## 2024-01-04 DIAGNOSIS — N401 Enlarged prostate with lower urinary tract symptoms: Secondary | ICD-10-CM | POA: Diagnosis not present

## 2024-01-04 DIAGNOSIS — N138 Other obstructive and reflux uropathy: Secondary | ICD-10-CM

## 2024-01-04 DIAGNOSIS — N200 Calculus of kidney: Secondary | ICD-10-CM

## 2024-01-04 DIAGNOSIS — Z8551 Personal history of malignant neoplasm of bladder: Secondary | ICD-10-CM | POA: Diagnosis not present

## 2024-01-04 LAB — URINALYSIS, ROUTINE W REFLEX MICROSCOPIC
Bilirubin, UA: NEGATIVE
Glucose, UA: NEGATIVE
Ketones, UA: NEGATIVE
Nitrite, UA: NEGATIVE
Protein,UA: NEGATIVE
Specific Gravity, UA: 1.01 (ref 1.005–1.030)
Urobilinogen, Ur: 1 mg/dL (ref 0.2–1.0)
pH, UA: 6 (ref 5.0–7.5)

## 2024-01-04 LAB — MICROSCOPIC EXAMINATION

## 2024-01-04 MED ORDER — TAMSULOSIN HCL 0.4 MG PO CAPS: 0.4000 mg | ORAL_CAPSULE | Freq: Two times a day (BID) | ORAL | 3 refills | Status: DC

## 2024-01-04 MED ORDER — CIPROFLOXACIN HCL 500 MG PO TABS
500.0000 mg | ORAL_TABLET | Freq: Once | ORAL | Status: AC
  Administered 2024-01-04: 500 mg via ORAL

## 2024-01-04 MED ORDER — SILODOSIN 8 MG PO CAPS: 8.0000 mg | ORAL_CAPSULE | Freq: Every day | ORAL | 11 refills | Status: DC
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
## 2024-01-17 ENCOUNTER — Encounter: Payer: Self-pay | Admitting: Urology

## 2024-01-17 NOTE — Patient Instructions (Signed)

## 2024-02-16 ENCOUNTER — Inpatient Hospital Stay (HOSPITAL_COMMUNITY)
Admission: EM | Admit: 2024-02-16 | Discharge: 2024-02-18 | DRG: 324 | Disposition: A | Discharge status: Home / Self Care | Attending: Cardiovascular Disease | Admitting: Cardiovascular Disease

## 2024-02-16 ENCOUNTER — Other Ambulatory Visit: Payer: Self-pay

## 2024-02-16 ENCOUNTER — Emergency Department (HOSPITAL_COMMUNITY)

## 2024-02-16 ENCOUNTER — Encounter (HOSPITAL_COMMUNITY): Payer: Self-pay | Admitting: Emergency Medicine

## 2024-02-16 DIAGNOSIS — E119 Type 2 diabetes mellitus without complications: Secondary | ICD-10-CM | POA: Diagnosis present

## 2024-02-16 DIAGNOSIS — Z7902 Long term (current) use of antithrombotics/antiplatelets: Secondary | ICD-10-CM

## 2024-02-16 DIAGNOSIS — Z7901 Long term (current) use of anticoagulants: Secondary | ICD-10-CM

## 2024-02-16 DIAGNOSIS — Z951 Presence of aortocoronary bypass graft: Secondary | ICD-10-CM

## 2024-02-16 DIAGNOSIS — R001 Bradycardia, unspecified: Secondary | ICD-10-CM | POA: Diagnosis present

## 2024-02-16 DIAGNOSIS — I2584 Coronary atherosclerosis due to calcified coronary lesion: Secondary | ICD-10-CM | POA: Diagnosis present

## 2024-02-16 DIAGNOSIS — N200 Calculus of kidney: Secondary | ICD-10-CM | POA: Diagnosis present

## 2024-02-16 DIAGNOSIS — G8929 Other chronic pain: Secondary | ICD-10-CM | POA: Diagnosis present

## 2024-02-16 DIAGNOSIS — Z7984 Long term (current) use of oral hypoglycemic drugs: Secondary | ICD-10-CM

## 2024-02-16 DIAGNOSIS — G4733 Obstructive sleep apnea (adult) (pediatric): Secondary | ICD-10-CM | POA: Diagnosis present

## 2024-02-16 DIAGNOSIS — M1990 Unspecified osteoarthritis, unspecified site: Secondary | ICD-10-CM | POA: Diagnosis present

## 2024-02-16 DIAGNOSIS — K219 Gastro-esophageal reflux disease without esophagitis: Secondary | ICD-10-CM | POA: Diagnosis present

## 2024-02-16 DIAGNOSIS — E669 Obesity, unspecified: Secondary | ICD-10-CM | POA: Diagnosis present

## 2024-02-16 DIAGNOSIS — Z8551 Personal history of malignant neoplasm of bladder: Secondary | ICD-10-CM

## 2024-02-16 DIAGNOSIS — Z809 Family history of malignant neoplasm, unspecified: Secondary | ICD-10-CM

## 2024-02-16 DIAGNOSIS — D509 Iron deficiency anemia, unspecified: Secondary | ICD-10-CM | POA: Diagnosis present

## 2024-02-16 DIAGNOSIS — Z9889 Other specified postprocedural states: Secondary | ICD-10-CM

## 2024-02-16 DIAGNOSIS — Z87442 Personal history of urinary calculi: Secondary | ICD-10-CM

## 2024-02-16 DIAGNOSIS — I480 Paroxysmal atrial fibrillation: Secondary | ICD-10-CM | POA: Diagnosis present

## 2024-02-16 DIAGNOSIS — E782 Mixed hyperlipidemia: Secondary | ICD-10-CM | POA: Diagnosis not present

## 2024-02-16 DIAGNOSIS — I4891 Unspecified atrial fibrillation: Secondary | ICD-10-CM | POA: Diagnosis not present

## 2024-02-16 DIAGNOSIS — N400 Enlarged prostate without lower urinary tract symptoms: Secondary | ICD-10-CM | POA: Diagnosis present

## 2024-02-16 DIAGNOSIS — I200 Unstable angina: Secondary | ICD-10-CM | POA: Diagnosis present

## 2024-02-16 DIAGNOSIS — R079 Chest pain, unspecified: Secondary | ICD-10-CM | POA: Diagnosis not present

## 2024-02-16 DIAGNOSIS — Z8619 Personal history of other infectious and parasitic diseases: Secondary | ICD-10-CM

## 2024-02-16 DIAGNOSIS — Z8616 Personal history of COVID-19: Secondary | ICD-10-CM

## 2024-02-16 DIAGNOSIS — Z7982 Long term (current) use of aspirin: Secondary | ICD-10-CM

## 2024-02-16 DIAGNOSIS — I2510 Atherosclerotic heart disease of native coronary artery without angina pectoris: Secondary | ICD-10-CM | POA: Diagnosis present

## 2024-02-16 DIAGNOSIS — E1169 Type 2 diabetes mellitus with other specified complication: Secondary | ICD-10-CM

## 2024-02-16 DIAGNOSIS — R011 Cardiac murmur, unspecified: Secondary | ICD-10-CM | POA: Diagnosis present

## 2024-02-16 DIAGNOSIS — I10 Essential (primary) hypertension: Secondary | ICD-10-CM | POA: Diagnosis not present

## 2024-02-16 DIAGNOSIS — Z8261 Family history of arthritis: Secondary | ICD-10-CM

## 2024-02-16 DIAGNOSIS — R0789 Other chest pain: Secondary | ICD-10-CM | POA: Diagnosis not present

## 2024-02-16 DIAGNOSIS — R42 Dizziness and giddiness: Secondary | ICD-10-CM | POA: Diagnosis not present

## 2024-02-16 DIAGNOSIS — I214 Non-ST elevation (NSTEMI) myocardial infarction: Principal | ICD-10-CM | POA: Diagnosis present

## 2024-02-16 DIAGNOSIS — I213 ST elevation (STEMI) myocardial infarction of unspecified site: Secondary | ICD-10-CM | POA: Diagnosis not present

## 2024-02-16 DIAGNOSIS — Z960 Presence of urogenital implants: Secondary | ICD-10-CM | POA: Diagnosis present

## 2024-02-16 DIAGNOSIS — Z79899 Other long term (current) drug therapy: Secondary | ICD-10-CM

## 2024-02-16 DIAGNOSIS — Z955 Presence of coronary angioplasty implant and graft: Secondary | ICD-10-CM

## 2024-02-16 DIAGNOSIS — Z801 Family history of malignant neoplasm of trachea, bronchus and lung: Secondary | ICD-10-CM

## 2024-02-16 DIAGNOSIS — Z6835 Body mass index (BMI) 35.0-35.9, adult: Secondary | ICD-10-CM

## 2024-02-16 DIAGNOSIS — R0689 Other abnormalities of breathing: Secondary | ICD-10-CM | POA: Diagnosis not present

## 2024-02-16 DIAGNOSIS — Z823 Family history of stroke: Secondary | ICD-10-CM

## 2024-02-16 DIAGNOSIS — Z87440 Personal history of urinary (tract) infections: Secondary | ICD-10-CM

## 2024-02-16 DIAGNOSIS — R0902 Hypoxemia: Secondary | ICD-10-CM | POA: Diagnosis not present

## 2024-02-16 DIAGNOSIS — I440 Atrioventricular block, first degree: Secondary | ICD-10-CM | POA: Diagnosis present

## 2024-02-16 LAB — COMPREHENSIVE METABOLIC PANEL WITH GFR
ALT: 19 U/L (ref 0–44)
AST: 21 U/L (ref 15–41)
Albumin: 3.9 g/dL (ref 3.5–5.0)
Alkaline Phosphatase: 69 U/L (ref 38–126)
Anion gap: 16 — ABNORMAL HIGH (ref 5–15)
BUN: 8 mg/dL (ref 8–23)
CO2: 19 mmol/L — ABNORMAL LOW (ref 22–32)
Calcium: 8.6 mg/dL — ABNORMAL LOW (ref 8.9–10.3)
Chloride: 103 mmol/L (ref 98–111)
Creatinine, Ser: 0.68 mg/dL (ref 0.61–1.24)
GFR, Estimated: >60 mL/min (ref >60)
Glucose, Bld: 116 mg/dL — ABNORMAL HIGH (ref 70–99)
Potassium: 3.6 mmol/L (ref 3.5–5.1)
Sodium: 138 mmol/L (ref 135–145)
Total Bilirubin: 0.9 mg/dL (ref 0.0–1.2)
Total Protein: 6.4 g/dL — ABNORMAL LOW (ref 6.5–8.1)

## 2024-02-16 LAB — CBC
HCT: 38.1 % — ABNORMAL LOW (ref 39.0–52.0)
Hemoglobin: 11.6 g/dL — ABNORMAL LOW (ref 13.0–17.0)
MCH: 24.4 pg — ABNORMAL LOW (ref 26.0–34.0)
MCHC: 30.4 g/dL (ref 30.0–36.0)
MCV: 80 fL (ref 80.0–100.0)
Platelets: 204 10*3/uL (ref 150–400)
RBC: 4.76 MIL/uL (ref 4.22–5.81)
RDW: 16.1 % — ABNORMAL HIGH (ref 11.5–15.5)
WBC: 8.3 10*3/uL (ref 4.0–10.5)
nRBC: 0 % (ref 0.0–0.2)

## 2024-02-16 LAB — PROTIME-INR
INR: 1.1 (ref 0.8–1.2)
Prothrombin Time: 13.9 s (ref 11.4–15.2)

## 2024-02-16 LAB — CBG MONITORING, ED: Glucose-Capillary: 114 mg/dL — ABNORMAL HIGH (ref 70–99)

## 2024-02-16 LAB — MAGNESIUM: Magnesium: 1.5 mg/dL — ABNORMAL LOW (ref 1.7–2.4)

## 2024-02-16 LAB — TROPONIN I (HIGH SENSITIVITY)
Troponin I (High Sensitivity): 11 ng/L (ref <18)
Troponin I (High Sensitivity): 32 ng/L — ABNORMAL HIGH (ref <18)

## 2024-02-16 LAB — GLUCOSE, CAPILLARY: Glucose-Capillary: 153 mg/dL — ABNORMAL HIGH (ref 70–99)

## 2024-02-16 MED ORDER — ONDANSETRON HCL 4 MG/2ML IJ SOLN: 4.0000 mg | Freq: Four times a day (QID) | INTRAMUSCULAR | Status: DC | PRN

## 2024-02-16 MED ORDER — METOPROLOL TARTRATE 50 MG PO TABS
50.0000 mg | ORAL_TABLET | Freq: Two times a day (BID) | ORAL | Status: DC
  Administered 2024-02-16: 50 mg via ORAL
  Filled 2024-02-16: qty 1

## 2024-02-16 MED ORDER — ATORVASTATIN CALCIUM 40 MG PO TABS
40.0000 mg | ORAL_TABLET | Freq: Every day | ORAL | Status: DC
  Administered 2024-02-16 – 2024-02-17 (×2): 40 mg via ORAL
  Filled 2024-02-16 (×2): qty 1

## 2024-02-16 MED ORDER — TAMSULOSIN HCL 0.4 MG PO CAPS
0.4000 mg | ORAL_CAPSULE | Freq: Two times a day (BID) | ORAL | Status: DC
  Administered 2024-02-16 – 2024-02-18 (×4): 0.4 mg via ORAL
  Filled 2024-02-16 (×4): qty 1

## 2024-02-16 MED ORDER — ASPIRIN 81 MG PO CHEW
81.0000 mg | CHEWABLE_TABLET | ORAL | Status: AC
  Administered 2024-02-17: 81 mg via ORAL
  Filled 2024-02-16: qty 1

## 2024-02-16 MED ORDER — ASPIRIN 81 MG PO TBEC: 81.0000 mg | DELAYED_RELEASE_TABLET | Freq: Every day | ORAL | Status: DC

## 2024-02-16 MED ORDER — SODIUM CHLORIDE 0.9 % WEIGHT BASED INFUSION
1.0000 mL/kg/h | INTRAVENOUS | Status: DC
  Administered 2024-02-17: 1 mL/kg/h via INTRAVENOUS

## 2024-02-16 MED ORDER — PANTOPRAZOLE SODIUM 40 MG PO TBEC
40.0000 mg | DELAYED_RELEASE_TABLET | Freq: Every day | ORAL | Status: DC
  Administered 2024-02-17 – 2024-02-18 (×2): 40 mg via ORAL
  Filled 2024-02-16 (×2): qty 1

## 2024-02-16 MED ORDER — HEPARIN BOLUS VIA INFUSION
4000.0000 [IU] | Freq: Once | INTRAVENOUS | Status: AC
  Administered 2024-02-16: 4000 [IU] via INTRAVENOUS
  Filled 2024-02-16: qty 4000

## 2024-02-16 MED ORDER — NITROGLYCERIN 0.4 MG SL SUBL: 0.4000 mg | SUBLINGUAL_TABLET | SUBLINGUAL | Status: DC | PRN

## 2024-02-16 MED ORDER — ACETAMINOPHEN 325 MG PO TABS
650.0000 mg | ORAL_TABLET | ORAL | Status: DC | PRN
  Administered 2024-02-17: 650 mg via ORAL
  Filled 2024-02-16: qty 2

## 2024-02-16 MED ORDER — AMLODIPINE BESYLATE 5 MG PO TABS
5.0000 mg | ORAL_TABLET | Freq: Every day | ORAL | Status: DC
  Administered 2024-02-17 – 2024-02-18 (×2): 5 mg via ORAL
  Filled 2024-02-16 (×2): qty 1

## 2024-02-16 MED ORDER — ISOSORBIDE MONONITRATE ER 60 MG PO TB24
120.0000 mg | ORAL_TABLET | Freq: Every day | ORAL | Status: DC
  Administered 2024-02-17 – 2024-02-18 (×2): 120 mg via ORAL
  Filled 2024-02-16 (×2): qty 2

## 2024-02-16 MED ORDER — ASPIRIN 81 MG PO TBEC
81.0000 mg | DELAYED_RELEASE_TABLET | Freq: Every day | ORAL | Status: DC
  Administered 2024-02-18: 81 mg via ORAL
  Filled 2024-02-16: qty 1

## 2024-02-16 MED ORDER — HEPARIN (PORCINE) 25000 UT/250ML-% IV SOLN
1500.0000 [IU]/h | INTRAVENOUS | Status: DC
  Administered 2024-02-16: 1200 [IU]/h via INTRAVENOUS
  Filled 2024-02-16: qty 250

## 2024-02-16 MED ORDER — ASPIRIN 81 MG PO CHEW: 324.0000 mg | CHEWABLE_TABLET | Freq: Once | ORAL | Status: DC

## 2024-02-16 MED ORDER — INSULIN ASPART 100 UNIT/ML IJ SOLN
0.0000 [IU] | Freq: Three times a day (TID) | INTRAMUSCULAR | Status: DC
  Administered 2024-02-18: 3 [IU] via SUBCUTANEOUS

## 2024-02-16 MED ORDER — MAGNESIUM SULFATE 2 GM/50ML IV SOLN
2.0000 g | Freq: Once | INTRAVENOUS | Status: AC
  Administered 2024-02-16: 2 g via INTRAVENOUS
  Filled 2024-02-16: qty 50

## 2024-02-16 MED ORDER — SODIUM CHLORIDE 0.9 % WEIGHT BASED INFUSION: 3.0000 mL/kg/h | INTRAVENOUS | Status: DC

## 2024-02-16 MED ORDER — CLOPIDOGREL BISULFATE 75 MG PO TABS
75.0000 mg | ORAL_TABLET | Freq: Every day | ORAL | Status: DC
  Administered 2024-02-17 – 2024-02-18 (×2): 75 mg via ORAL
  Filled 2024-02-16 (×3): qty 1

## 2024-02-16 NOTE — ED Triage Notes (Addendum)
 Pt BIB [facility] due to chest pain that radiated towards jaw this morning.  Pt does have 4 stents in place.  Pt took 2 nitroglycerin at home.  EMS gave 324 aspirin en route. NS.   VS BP 137/79, HR 90 18 g right forearm.  20g left hand.

## 2024-02-16 NOTE — ED Notes (Signed)
 Hospital bed ordered.

## 2024-02-16 NOTE — Progress Notes (Signed)
 PHARMACY - ANTICOAGULATION CONSULT NOTE  Pharmacy Consult for heparin Indication: chest pain/ACS  No Known Allergies  Patient Measurements: Height: 5\' 9"  (175.3 cm) Weight: 108 kg (238 lb) IBW/kg (Calculated) : 70.7 HEPARIN DW (KG): 94.2  Vital Signs: Temp: 97.6 F (36.4 C) (03/27 1449) Temp Source: Oral (03/27 1449) BP: 136/91 (03/27 1400) Pulse Rate: 100 (03/27 1400)  Labs: Recent Labs    02/16/24 1233 02/16/24 1428  HGB 11.6*  --   HCT 38.1*  --   PLT 204  --   LABPROT 13.9  --   INR 1.1  --   CREATININE 0.68  --   TROPONINIHS 11 32*    Estimated Creatinine Clearance: 99.6 mL/min (by C-G formula based on SCr of 0.68 mg/dL).   Medical History: Past Medical History:  Diagnosis Date   Angina pectoris (HCC)    Arthritis    Bladder cancer (HCC) 03/2020   Bladder carcinoma (HCC)    BMI 35.0-35.9,adult    CAD (coronary artery disease)    a. 03/2012 Cath: LAD 80, RCA 80, nl EF;  b. PCI of RCA w 3.0x45mm Promus DES, FFR of LAD was nl @ 0.85 ->Med Rx.;  c. LHC 02/15/13: proximal LAD 40-50%, mid LAD 95%, small D1 95-99%, mid CFX 30-40%, proximal RCA stent patent with ostial 20-30% prior to the stent, ostial PDA 50%, EF 55-65%. PCI:  Promus DES to the mid LAD and POBA to the proximal D1    Chest pain    COVID    DM (diabetes mellitus) (HCC)    Elevated TSH    Headache    History of angina    Hypercholesterolemia    Hyperlipidemia    Hypertension    Hypotension    Kidney stone    Knee pain    Low back pain    Morbid obesity (HCC)    OA (osteoarthritis)    Obesity    OSA (obstructive sleep apnea)    Sepsis (HCC)    Skin lesion    Tension headache    Tinnitus, bilateral    Umbilical hernia    UTI (urinary tract infection)     Medications:  Scheduled:   atorvastatin  40 mg Oral QHS   [START ON 02/17/2024] clopidogrel  75 mg Oral Daily   metoprolol tartrate  50 mg Oral BID    Assessment: 22 YOM who presents with concern for chest pain. He does have a  history of CAD s/p DES to RCA in 2013, DES to mid LAD and balloon angioplasty in 2014. Troponin was 11 upon presentation but increased to 32 on recheck. Patient noted to be in atrial fibrillation in ED. Pharmacy has been consulted to initiate heparin for ACS.   Patient was not on any anticoagulation PTA. Scr stable at 0.68. Hgb stable at 11.6, PLT count stable 204.   Goal of Therapy:  Heparin level 0.3-0.7 units/ml Monitor platelets by anticoagulation protocol: Yes   Plan:  Give 4000 units bolus x 1 Start heparin infusion at 1200 units/hr Check anti-Xa level in 8 hours and daily while on heparin Continue to monitor H&H and platelets  Lennie Muckle, PharmD PGY1 Pharmacy Resident 02/16/2024 4:46 PM

## 2024-02-16 NOTE — ED Provider Notes (Signed)
 [place] EMERGENCY DEPARTMENT AT [facility] Provider Note   CSN: [id number] Arrival date & time: 02/16/24  1222     History  Chief Complaint  Patient presents with   Chest Pain    Kyle Lambert is a 73 y.o. male.  HPI Patient presents via EMS with concern for chest pain.  Patient has multiple medical problems including prior coronary interventions, but was in his usual state of health ~2 days ago.  Now over the past 2 days he has had episodes of chest pain that been severe, sternal, radiating to his jaw.  He has had relief with nitroglycerin, though with decreasing length of relief and increasing frequency of pain episodes.  Today with more severe, more sustained episode of pain in spite of to light nitroglycerin at home he called EMS.  EMS reports patient received aspirin, additional nitroglycerin, had improvement in his pain.  Vital signs notable for normotension, mild tachycardia, questionable A-fib.    Home Medications Prior to Admission medications   Medication Sig Start Date End Date Taking? Authorizing Provider  acetaminophen (TYLENOL) 500 MG tablet Take 1,000 mg by mouth every 6 (six) hours as needed for mild pain, moderate pain or headache.     [provider]  amLODipine (NORVASC) 5 MG tablet TAKE 1 TABLET DAILY Patient taking differently: Take 5 mg by mouth daily. 12/25/13   Tonny Bollman, MD  atorvastatin (LIPITOR) 40 MG tablet Take 40 mg by mouth at bedtime.     [provider]  clopidogrel (PLAVIX) 75 MG tablet Take 1 tablet (75 mg total) by mouth daily. 05/11/23   Nahser, [name] Ping, MD  cyclobenzaprine (FLEXERIL) 10 MG tablet Take 10 mg by mouth 3 (three) times daily as needed for muscle spasms. 04/07/23   [provider]  gabapentin (NEURONTIN) 300 MG capsule Take 1 pill at bedtime for one week, then increase to 1 pill twice a day for one week, then increase to 1 pill three times a day 07/27/22   Ocie Doyne, MD  isosorbide  mononitrate (IMDUR) 120 MG 24 hr tablet Take 1 tablet (120 mg total) by mouth daily. 08/24/17   Tereso Newcomer T, PA-C  lisinopril (PRINIVIL,ZESTRIL) 20 MG tablet Take 20 mg daily by mouth.    [provider]  metFORMIN (GLUCOPHAGE) 1000 MG tablet Take 1,000 mg by mouth 2 (two) times daily. 03/08/23   [provider]  metoprolol tartrate (LOPRESSOR) 25 MG tablet TAKE 1 TABLET TWICE A DAY Patient taking differently: Take 25 mg by mouth daily.    Tonny Bollman, MD  Multiple Vitamins-Minerals (PRESERVISION AREDS 2 PO) Take 1 tablet by mouth in the morning and at bedtime.    [provider]  nitroGLYCERIN (NITROSTAT) 0.4 MG SL tablet Place 1 tablet (0.4 mg total) under the tongue every 5 (five) minutes x 3 doses as needed for chest pain. 05/11/23   Nahser, [name] Ping, MD  pantoprazole (PROTONIX) 40 MG tablet TAKE 1 TABLET BY MOUTH ONCE DAILY --  NEEDS  APPOINTMENT  FOR  FURTHER  REFILLS Patient taking differently: Take 40 mg by mouth daily. 01/01/21   Tonny Bollman, MD  silodosin (RAPAFLO) 8 MG CAPS capsule Take 1 capsule (8 mg total) by mouth at bedtime. 01/04/24   McKenzie, Mardene Celeste, MD  tamsulosin (FLOMAX) 0.4 MG CAPS capsule Take 1 capsule (0.4 mg total) by mouth 2 (two) times daily. 01/04/24   McKenzie, Mardene Celeste, MD  Vibegron (GEMTESA) 75 MG TABS Take 1 tablet (75  mg total) by mouth daily. 11/01/23   Donnita Falls, FNP      Allergies    Patient has no known allergies.    Review of Systems   Review of Systems  Physical Exam Updated Vital Signs BP (!) 136/91   Pulse 100   Temp 97.6 F (36.4 C) (Oral)   Resp 17   Ht 5\' 9"  (1.753 m)   Wt 108 kg   SpO2 100%   BMI 35.15 kg/m  Physical Exam  ED Results / Procedures / Treatments   Labs (all labs ordered are listed, but only abnormal results are displayed) Labs Reviewed  CBC - Abnormal; Notable for the following components:      Result Value   Hemoglobin 11.6 (*)    HCT 38.1 (*)    MCH 24.4 (*)    RDW  16.1 (*)    All other components within normal limits  COMPREHENSIVE METABOLIC PANEL WITH GFR - Abnormal; Notable for the following components:   CO2 19 (*)    Glucose, Bld 116 (*)    Calcium 8.6 (*)    Total Protein 6.4 (*)    Anion gap 16 (*)    All other components within normal limits  MAGNESIUM - Abnormal; Notable for the following components:   Magnesium 1.5 (*)    All other components within normal limits  CBG MONITORING, ED - Abnormal; Notable for the following components:   Glucose-Capillary 114 (*)    All other components within normal limits  PROTIME-INR  TROPONIN I (HIGH SENSITIVITY)  TROPONIN I (HIGH SENSITIVITY)    EKG A-fib, 105, abnormal   Radiology DG Chest Portable 1 View Result Date: 02/16/2024 CLINICAL DATA:  Chest pain. EXAM: PORTABLE CHEST 1 VIEW COMPARISON:  Chest radiograph dated 04/19/2020. FINDINGS: The heart size and mediastinal contours are within normal limits. No focal consolidation, sizeable pleural effusion, or pneumothorax. No acute osseous abnormality. IMPRESSION: No acute cardiopulmonary findings. Electronically Signed   By: Hart Robinsons M.D.   On: 02/16/2024 13:30    Procedures Procedures    Medications Ordered in ED Medications  magnesium sulfate IVPB 2 g 50 mL (2 g Intravenous New Bag/Given 02/16/24 1438)    ED Course/ Medical Decision Making/ A&P                                 Medical Decision Making Patient with history of CABG, hypertension, hyperlipidemia presents with new atypical chest pain.  Patient's risk profile is substantial, and given the new characteristics of chest pain, requiring increasing amounts of nitroglycerin concern for unstable angina, though the patient's initial EKG is not overtly ischemic. Patient placed on monitors, received aspirin and route, labs sent x-ray ordered. Cardiac 95 sinus normal Pulse ox 100% room air normal  Amount and/or Complexity of Data Reviewed Independent Historian: EMS     Details: Spouse eventually at bedside as well. Labs: ordered. Decision-making details documented in ED Course. Radiology: ordered and independent interpretation performed. Decision-making details documented in ED Course. ECG/medicine tests: ordered and independent interpretation performed. Decision-making details documented in ED Course.  Risk Prescription drug management.   2:58 PM Patient in no distress, he has had small episodes of chest pain while here.  Initial findings are generally reassuring, without substantial elevation in first troponin value.  He has had no substantial abnormalities on cardiac monitor though he continues to have A-fib. Given concern for unstable angina, though with  otherwise reassuring findings thus far, patient will be seen by her cardiology colleagues.   Final Clinical Impression(s) / ED Diagnoses Final diagnoses:  Atypical chest pain     Gerhard Munch, MD 02/16/24 1458

## 2024-02-16 NOTE — H&P (Signed)
 Cardiology H&P   Patient ID: Kyle Lambert MRN: [id number]; DOB: 1951/01/08  Admit date: 02/16/2024 Date of Consult: 02/16/2024  PCP:  Juliette Alcide, MD   [facility] Providers Cardiologist:  Kristeen Miss, MD   {  Chief Complaint: chest pain  Patient Profile:   Kyle Lambert is a 73 y.o. male with a hx of coronary artery disease s/p DES to the RCA in 2013, DES to mid LAD and balloon angioplasty to the first diagonal in 2014, hypertension, hyperlipidemia, type 2 diabetes, who is being seen 02/16/2024 for the evaluation of atrial fibrillation at the request of Gerhard Munch.  History of Present Illness:   Kyle Lambert is a 74 y.o. male prior cardiac history listed below  Coronary artery disease s/p DES to the RCA in 2013, DES to mid LAD and balloon angioplasty to the first diagonal in 2014.  Had a Myoview in 2018 which showed he was low risk Underwent cardiac catheterization on 01/2019 for progressive anginal symptoms.  This showed patent stents with moderate residual restenosis to the medium caliber D1.  Recommended to treat with medical therapy. this patient has been followed by Dr. Elease Hashimoto. Most recent echo on 05/2023 showed of EF of 60 to 65%, no regional wall motion abnormalities, elevated left ventricular end-diastolic pressure, normal RV function, normal valve structure with minimal regurgitation and stenosis.  Kyle Lambert is a 73 year old male presented to the emergency department via EMS for multiple episodes of chest pain over the past 4 days that radiate towards his jaw. Initially had relief with nitroglycerin but today he had no relief at home when he took nitroglycerin.  On the way to the hospital he received aspirin and an additional nitroglycerin and his pain improved.  Labs in the emergency department showed normal HsTn of 11.  CHEM panel showed sodium of 138, potassium of 3.6, decreased CO2 of 19, decreased calcium of 8.6, decreased magnesium of 1.5, normocytic  anemia with a hemoglobin of 11.6.  Chest x-ray showed no acute cardiopulmonary findings. EKG showed atrial fibrillation with a ventricular rate of 105.  On physical exam patient affirms the above history.  Chest pain is not associated with exertion or respiration.  Has had worsening fatigue recently. He currently does not have chest pain.  Denies shortness of breath, palpitations, lightheadedness, diaphoresis, lower extremity edema.  Denies having any history of atrial fibrillation.  Denies alcohol use, tobacco use, or illicit substances. snores during sleep and has not had a sleep study.  Past Medical History:  Diagnosis Date   Angina pectoris (HCC)    Arthritis    Bladder cancer (HCC) 03/2020   Bladder carcinoma (HCC)    BMI 35.0-35.9,adult    CAD (coronary artery disease)    a. 03/2012 Cath: LAD 80, RCA 80, nl EF;  b. PCI of RCA w 3.0x3mm Promus DES, FFR of LAD was nl @ 0.85 ->Med Rx.;  c. LHC 02/15/13: proximal LAD 40-50%, mid LAD 95%, small D1 95-99%, mid CFX 30-40%, proximal RCA stent patent with ostial 20-30% prior to the stent, ostial PDA 50%, EF 55-65%. PCI:  Promus DES to the mid LAD and POBA to the proximal D1    Chest pain    COVID    DM (diabetes mellitus) (HCC)    Elevated TSH    Headache    History of angina    Hypercholesterolemia    Hyperlipidemia    Hypertension    Hypotension    Kidney stone  Knee pain    Low back pain    Morbid obesity (HCC)    OA (osteoarthritis)    Obesity    OSA (obstructive sleep apnea)    Sepsis (HCC)    Skin lesion    Tension headache    Tinnitus, bilateral    Umbilical hernia    UTI (urinary tract infection)     Past Surgical History:  Procedure Laterality Date   CORONARY ANGIOPLASTY WITH STENT PLACEMENT  04/24/12   DES-RCA   CORONARY ANGIOPLASTY WITH STENT PLACEMENT  02/15/13   Patent RCA stent, diffuse nonobstructive R PDA, LCx disease, 99% mid LAD stenosis s/p DES-mid LAD; LVEF 60%   CORONARY STENT PLACEMENT     CYSTOSCOPY W/  RETROGRADES  04/07/2020   Procedure: CYSTOSCOPY WITH LEFT RETROGRADE PYELOGRAM;  Surgeon: Malen Gauze, MD;  Location: [facility];  Service: Urology;;   CYSTOSCOPY WITH BIOPSY  04/07/2020   Procedure: CYSTOSCOPY WITH BLADDER BIOPSY;  Surgeon: Malen Gauze, MD;  Location: [facility];  Service: Urology;;   Bluford Kaufmann WITH FULGERATION  04/07/2020   Procedure: Bluford Kaufmann WITH FULGERATION;  Surgeon: Malen Gauze, MD;  Location: [facility];  Service: Urology;;   CYSTOSCOPY/URETEROSCOPY/HOLMIUM LASER/STENT PLACEMENT Left 04/07/2020   Procedure: CYSTOSCOPY/LEFT URETEROSCOPY/HOLMIUM LASER LITHOTRIPSY/LEFT URETERAL STENT PLACEMENT;  Surgeon: Malen Gauze, MD;  Location: [facility];  Service: Urology;  Laterality: Left;   HERNIA REPAIR Left inguinal   LEFT HEART CATH AND CORONARY ANGIOGRAPHY N/A 01/31/2019   Procedure: LEFT HEART CATH AND CORONARY ANGIOGRAPHY;  Surgeon: Tonny Bollman, MD;  Location: [facility] INVASIVE CV LAB;  Service: Cardiovascular;  Laterality: N/A;   LEFT HEART CATHETERIZATION WITH CORONARY ANGIOGRAM N/A 04/21/2012   Procedure: LEFT HEART CATHETERIZATION WITH CORONARY ANGIOGRAM;  Surgeon: Herby Abraham, MD;  Location: [facility] CATH LAB;  Service: Cardiovascular;  Laterality: N/A;   LEFT HEART CATHETERIZATION WITH CORONARY ANGIOGRAM N/A 02/15/2013   Procedure: LEFT HEART CATHETERIZATION WITH CORONARY ANGIOGRAM;  Surgeon: Tonny Bollman, MD;  Location: [facility] CATH LAB;  Service: Cardiovascular;  Laterality: N/A;   LITHOTRIPSY     PERCUTANEOUS CORONARY STENT INTERVENTION (PCI-S) N/A 04/24/2012   Procedure: PERCUTANEOUS CORONARY STENT INTERVENTION (PCI-S);  Surgeon: Tonny Bollman, MD;  Location: [facility] CATH LAB;  Service: Cardiovascular;  Laterality: N/A;   PERCUTANEOUS CORONARY STENT INTERVENTION (PCI-S)  02/15/2013   Procedure: PERCUTANEOUS CORONARY STENT INTERVENTION (PCI-S);  Surgeon: Tonny Bollman, MD;  Location: [facility] CATH LAB;  Service: Cardiovascular;;     Home Medications:  Prior to Admission  medications   Medication Sig Start Date End Date Taking? Authorizing Provider  acetaminophen (TYLENOL) 500 MG tablet Take 1,000 mg by mouth every 6 (six) hours as needed for mild pain, moderate pain or headache.    Yes [provider]  amLODipine (NORVASC) 5 MG tablet TAKE 1 TABLET DAILY Patient taking differently: Take 5 mg by mouth daily. 12/25/13  Yes Tonny Bollman, MD  atorvastatin (LIPITOR) 40 MG tablet Take 40 mg by mouth at bedtime.    Yes [provider]  clopidogrel (PLAVIX) 75 MG tablet Take 1 tablet (75 mg total) by mouth daily. 05/11/23  Yes Nahser, [name] Ping, MD  gabapentin (NEURONTIN) 300 MG capsule Take 1 pill at bedtime for one week, then increase to 1 pill twice a day for one week, then increase to 1 pill three times a day 07/27/22  Yes Ocie Doyne, MD  isosorbide mononitrate (IMDUR) 120 MG 24 hr tablet Take 1 tablet (120 mg total) by mouth daily. 08/24/17  Yes Alben Spittle,  Scott T, PA-C  lisinopril (PRINIVIL,ZESTRIL) 20 MG tablet Take 20 mg daily by mouth.   Yes [provider]  metFORMIN (GLUCOPHAGE) 1000 MG tablet Take 1,000 mg by mouth 2 (two) times daily. 03/08/23  Yes [provider]  metoprolol tartrate (LOPRESSOR) 25 MG tablet TAKE 1 TABLET TWICE A DAY Patient taking differently: Take 25 mg by mouth daily.   Yes Tonny Bollman, MD  Multiple Vitamins-Minerals (PRESERVISION AREDS 2 PO) Take 1 tablet by mouth in the morning and at bedtime.   Yes [provider]  nitroGLYCERIN (NITROSTAT) 0.4 MG SL tablet Place 1 tablet (0.4 mg total) under the tongue every 5 (five) minutes x 3 doses as needed for chest pain. 05/11/23  Yes Nahser, [name] Ping, MD  pantoprazole (PROTONIX) 40 MG tablet TAKE 1 TABLET BY MOUTH ONCE DAILY --  NEEDS  APPOINTMENT  FOR  FURTHER  REFILLS Patient taking differently: Take 40 mg by mouth daily. 01/01/21  Yes Tonny Bollman, MD  silodosin (RAPAFLO) 8 MG CAPS capsule Take 1 capsule (8 mg total) by mouth at bedtime. 01/04/24   Yes McKenzie, Mardene Celeste, MD  tamsulosin (FLOMAX) 0.4 MG CAPS capsule Take 1 capsule (0.4 mg total) by mouth 2 (two) times daily. 01/04/24  Yes McKenzie, Mardene Celeste, MD  Vibegron (GEMTESA) 75 MG TABS Take 1 tablet (75 mg total) by mouth daily. 11/01/23  Yes Donnita Falls, FNP  cyclobenzaprine (FLEXERIL) 10 MG tablet Take 10 mg by mouth 3 (three) times daily as needed for muscle spasms. 04/07/23   [provider]    Inpatient Medications: Scheduled Meds:  atorvastatin  40 mg Oral QHS   [START ON 02/17/2024] clopidogrel  75 mg Oral Daily   heparin  4,000 Units Intravenous Once   metoprolol tartrate  50 mg Oral BID   Continuous Infusions:  heparin     PRN Meds:   Allergies:   No Known Allergies  Social History:   Social History   Socioeconomic History   Marital status: Married    Spouse name: [name]   Number of children: 1   Years of education: Not on file   Highest education level: Not on file  Occupational History   Not on file  Tobacco Use   Smoking status: Never   Smokeless tobacco: Never  Vaping Use   Vaping status: Never Used  Substance and Sexual Activity   Alcohol use: No    Comment: " quit along time ago"   Drug use: No   Sexual activity: Not Currently  Other Topics Concern   Not on file  Social History Narrative   Lives with wife, dgtr, 2 grandsons live with   Social Drivers of Health   Financial Resource Strain: Not on file  Food Insecurity: No Food Insecurity (03/26/2023)   Hunger Vital Sign    Worried About Running Out of Food in the Last Year: Never true    Ran Out of Food in the Last Year: Never true  Transportation Needs: No Transportation Needs (03/26/2023)   PRAPARE - Administrator, Civil Service (Medical): No    Lack of Transportation (Non-Medical): No  Physical Activity: Not on file  Stress: Not on file  Social Connections: Not on file  Intimate Partner Violence: Not At Risk (03/26/2023)   Humiliation, Afraid, Rape, and Kick  questionnaire    Fear of Current or Ex-Partner: No    Emotionally Abused: No    Physically Abused: No    Sexually Abused: No  Family History:    Family History  Problem Relation Age of Onset   Cancer Mother    Rheum arthritis Mother    Lung cancer Father    Stroke Brother      ROS:  Please see the history of present illness.   All other ROS reviewed and negative.     Physical Exam/Data:   Vitals:   02/16/24 1228 02/16/24 1330 02/16/24 1400 02/16/24 1449  BP: (!) 131/90 (!) 123/94 (!) 136/91   Pulse: (!) 107 90 100   Resp: 19 17 17    Temp: 97.6 F (36.4 C)   97.6 F (36.4 C)  TempSrc: Oral   Oral  SpO2: 100% 100% 100%   Weight:      Height:       No intake or output data in the 24 hours ending 02/16/24 1715    02/16/2024   12:25 PM 10/04/2023   11:46 AM 06/07/2023    7:41 AM  Last 3 Weights  Weight (lbs) 238 lb 221 lb 9.6 oz 242 lb  Weight (kg) 107.956 kg 100.517 kg 109.77 kg     Body mass index is 35.15 kg/m.  General:  Well nourished, well developed, in no acute distress HEENT: normal Neck: no JVD Vascular: No carotid bruits; Distal pulses 2+ bilaterally Cardiac:  normal S1, S2; RRR; no murmur  Lungs:  clear to auscultation bilaterally, no wheezing, rhonchi or rales  Abd: soft, nontender, no hepatomegaly  Ext: no edema Musculoskeletal:  No deformities, BUE and BLE strength normal and equal Skin: warm and dry  Neuro:  CNs 2-12 intact, no focal abnormalities noted Psych:  Normal affect   EKG:  The EKG was personally reviewed and demonstrates:   EKG showed atrial fibrillation with a ventricular rate of 105. Telemetry:  Telemetry was personally reviewed and demonstrates: Atrial fibrillation with episodes of normal sinus rhythm is rate controlled with rates typically in the 90s.  Relevant CV Studies: Echo pending  Laboratory Data:  High Sensitivity Troponin:   Recent Labs  Lab 02/16/24 1233 02/16/24 1428  TROPONINIHS 11 32*     Chemistry Recent  Labs  Lab 02/16/24 1233  NA 138  K 3.6  CL 103  CO2 19*  GLUCOSE 116*  BUN 8  CREATININE 0.68  CALCIUM 8.6*  MG 1.5*  GFRNONAA >60  ANIONGAP 16*    Recent Labs  Lab 02/16/24 1233  PROT 6.4*  ALBUMIN 3.9  AST 21  ALT 19  ALKPHOS 69  BILITOT 0.9   Lipids No results for input(s): "CHOL", "TRIG", "HDL", "LABVLDL", "LDLCALC", "CHOLHDL" in the last 168 hours.  Hematology Recent Labs  Lab 02/16/24 1233  WBC 8.3  RBC 4.76  HGB 11.6*  HCT 38.1*  MCV 80.0  MCH 24.4*  MCHC 30.4  RDW 16.1*  PLT 204   Thyroid No results for input(s): "TSH", "FREET4" in the last 168 hours.  BNPNo results for input(s): "BNP", "PROBNP" in the last 168 hours.  DDimer No results for input(s): "DDIMER" in the last 168 hours.   Radiology/Studies:  DG Chest Portable 1 View Result Date: 02/16/2024 CLINICAL DATA:  Chest pain. EXAM: PORTABLE CHEST 1 VIEW COMPARISON:  Chest radiograph dated 04/19/2020. FINDINGS: The heart size and mediastinal contours are within normal limits. No focal consolidation, sizeable pleural effusion, or pneumothorax. No acute osseous abnormality. IMPRESSION: No acute cardiopulmonary findings. Electronically Signed   By: Hart Robinsons M.D.   On: 02/16/2024 13:30     Assessment and Plan:  Kyle Lambert is a 73 y.o. male with a hx of coronary artery disease s/p DES to the RCA in 2013, DES to mid LAD and balloon angioplasty to the first diagonal in 2014 who is being seen 02/16/2024 for the evaluation of unstable angina at the request of Gerhard Munch.   Unstable angina  Coronary artery disease s/p DES to the RCA in 2013, DES to mid LAD and balloon angioplasty to the first diagonal in 2014. -Has had worsening chest pain that radiates to the jaw for the past 4 days that improves with nitroglycerin. -High-sensitivity troponins slightly elevated 11->32.  No acute signs of ischemia seen on EKG. -Order echo -Planning for left heart cath tomorrow  -Continue Imdur -Continue  Lipitor 40 mg daily -Continue Plavix 75 mg daily -Continue aspirin 81 mg daily -Increase to metoprolol tartrate 50 mg twice daily  Atrial fibrillation CHA2DS2-VASc Score = 4   :This indicates a 4.8% annual risk of stroke. The patient's score is based upon: CHF History: 0 HTN History: 1 Diabetes History: 1 Stroke History: 0 Vascular Disease History: 1 Age Score: 1 Gender Score: 0 -Has no prior history of atrial fibrillation.  Denies palpitations but reported feeling more fatigued recently. -On telemetry was rate controlled with rates typically in the 90s.  Appears to be going in and out of atrial fibrillation -Needs outpatient sleep study -Order TSH -Start IV heparin -Increase to metoprolol tartrate 50 mg twice daily.  Hypertension Continue Imdur, amlodipine 5 mg daily Increase metoprolol to 50 mg twice daily -Lisinopril was on medication list but patient did not recall taking it.  Hyperlipidemia -Order LPA and lipid panel -Continue Lipitor 40 mg daily   GERD Continue Protonix 40 mg daily  Type 2 diabetes -Hold metformin -Order sliding scale insulin  BPH -Continue tamsulosin -silodosin and Viberon are not in medications patient reported taking at home will not prescribe it.   -Gabapentin was not on medications patient reported taking at home will not prescribe it.     Risk Assessment/Risk Scores:    TIMI Risk Score for Unstable Angina or Non-ST Elevation MI:   The patient's TIMI risk score is  , which indicates a  % risk of all cause mortality, new or recurrent myocardial infarction or need for urgent revascularization in the next 14 days.{ CHA2DS2-VASc Score = 4 This indicates a 4.8% annual risk of stroke. The patient's score is based upon: CHF History: 0 HTN History: 1 Diabetes History: 1 Stroke History: 0 Vascular Disease History: 1 Age Score: 1 Gender Score: 0     Code Status: Full Code  Severity of Illness: The appropriate patient status for  this patient is OBSERVATION. Observation status is judged to be reasonable and necessary in order to provide the required intensity of service to ensure the patient's safety. The patient's presenting symptoms, physical exam findings, and initial radiographic and laboratory data in the context of their medical condition is felt to place them at decreased risk for further clinical deterioration. Furthermore, it is anticipated that the patient will be medically stable for discharge from the hospital within 2 midnights of admission.    For questions or updates, please contact [facility] Please consult [email or web address] for contact info under     Signed, Arabella Merles, PA-C  02/16/2024 5:15 PM

## 2024-02-17 ENCOUNTER — Other Ambulatory Visit (HOSPITAL_COMMUNITY): Payer: Self-pay

## 2024-02-17 ENCOUNTER — Telehealth (HOSPITAL_COMMUNITY): Payer: Self-pay | Admitting: Pharmacy Technician

## 2024-02-17 ENCOUNTER — Observation Stay (HOSPITAL_BASED_OUTPATIENT_CLINIC_OR_DEPARTMENT_OTHER)

## 2024-02-17 ENCOUNTER — Inpatient Hospital Stay (HOSPITAL_COMMUNITY)
Admission: EM | Disposition: A | Discharge status: Home / Self Care | Payer: Self-pay | Source: Home / Self Care | Attending: Cardiovascular Disease

## 2024-02-17 DIAGNOSIS — G4733 Obstructive sleep apnea (adult) (pediatric): Secondary | ICD-10-CM | POA: Diagnosis not present

## 2024-02-17 DIAGNOSIS — K219 Gastro-esophageal reflux disease without esophagitis: Secondary | ICD-10-CM | POA: Diagnosis not present

## 2024-02-17 DIAGNOSIS — R011 Cardiac murmur, unspecified: Secondary | ICD-10-CM | POA: Diagnosis not present

## 2024-02-17 DIAGNOSIS — I200 Unstable angina: Secondary | ICD-10-CM | POA: Diagnosis present

## 2024-02-17 DIAGNOSIS — R079 Chest pain, unspecified: Secondary | ICD-10-CM

## 2024-02-17 DIAGNOSIS — E119 Type 2 diabetes mellitus without complications: Secondary | ICD-10-CM | POA: Diagnosis not present

## 2024-02-17 DIAGNOSIS — I480 Paroxysmal atrial fibrillation: Secondary | ICD-10-CM | POA: Diagnosis not present

## 2024-02-17 DIAGNOSIS — I2510 Atherosclerotic heart disease of native coronary artery without angina pectoris: Secondary | ICD-10-CM | POA: Diagnosis not present

## 2024-02-17 DIAGNOSIS — I214 Non-ST elevation (NSTEMI) myocardial infarction: Secondary | ICD-10-CM | POA: Diagnosis not present

## 2024-02-17 DIAGNOSIS — N400 Enlarged prostate without lower urinary tract symptoms: Secondary | ICD-10-CM | POA: Diagnosis not present

## 2024-02-17 DIAGNOSIS — Z951 Presence of aortocoronary bypass graft: Secondary | ICD-10-CM | POA: Diagnosis not present

## 2024-02-17 DIAGNOSIS — I25110 Atherosclerotic heart disease of native coronary artery with unstable angina pectoris: Secondary | ICD-10-CM

## 2024-02-17 DIAGNOSIS — Z8551 Personal history of malignant neoplasm of bladder: Secondary | ICD-10-CM | POA: Diagnosis not present

## 2024-02-17 DIAGNOSIS — I10 Essential (primary) hypertension: Secondary | ICD-10-CM | POA: Diagnosis not present

## 2024-02-17 DIAGNOSIS — G8929 Other chronic pain: Secondary | ICD-10-CM | POA: Diagnosis not present

## 2024-02-17 DIAGNOSIS — N200 Calculus of kidney: Secondary | ICD-10-CM | POA: Diagnosis not present

## 2024-02-17 DIAGNOSIS — I440 Atrioventricular block, first degree: Secondary | ICD-10-CM | POA: Diagnosis not present

## 2024-02-17 DIAGNOSIS — Z7984 Long term (current) use of oral hypoglycemic drugs: Secondary | ICD-10-CM | POA: Diagnosis not present

## 2024-02-17 DIAGNOSIS — Z79899 Other long term (current) drug therapy: Secondary | ICD-10-CM | POA: Diagnosis not present

## 2024-02-17 DIAGNOSIS — M1990 Unspecified osteoarthritis, unspecified site: Secondary | ICD-10-CM | POA: Diagnosis not present

## 2024-02-17 DIAGNOSIS — I4891 Unspecified atrial fibrillation: Secondary | ICD-10-CM | POA: Diagnosis not present

## 2024-02-17 DIAGNOSIS — I2584 Coronary atherosclerosis due to calcified coronary lesion: Secondary | ICD-10-CM | POA: Diagnosis not present

## 2024-02-17 DIAGNOSIS — D509 Iron deficiency anemia, unspecified: Secondary | ICD-10-CM | POA: Diagnosis not present

## 2024-02-17 DIAGNOSIS — E782 Mixed hyperlipidemia: Secondary | ICD-10-CM | POA: Diagnosis not present

## 2024-02-17 DIAGNOSIS — R001 Bradycardia, unspecified: Secondary | ICD-10-CM | POA: Diagnosis not present

## 2024-02-17 DIAGNOSIS — E669 Obesity, unspecified: Secondary | ICD-10-CM | POA: Diagnosis not present

## 2024-02-17 DIAGNOSIS — Z960 Presence of urogenital implants: Secondary | ICD-10-CM | POA: Diagnosis not present

## 2024-02-17 DIAGNOSIS — Z8616 Personal history of COVID-19: Secondary | ICD-10-CM | POA: Diagnosis not present

## 2024-02-17 HISTORY — PX: LEFT HEART CATH AND CORONARY ANGIOGRAPHY: CATH118249

## 2024-02-17 HISTORY — PX: CORONARY LITHOTRIPSY: CATH118330

## 2024-02-17 HISTORY — PX: CORONARY STENT INTERVENTION: CATH118234

## 2024-02-17 LAB — POCT ACTIVATED CLOTTING TIME
Activated Clotting Time: 262 s
Activated Clotting Time: 273 s
Activated Clotting Time: 279 s
Activated Clotting Time: 285 s
Activated Clotting Time: 268 s
Activated Clotting Time: 291 s

## 2024-02-17 LAB — LIPID PANEL
Cholesterol: 98 mg/dL (ref 0–200)
HDL: 36 mg/dL — ABNORMAL LOW (ref >40)
LDL Cholesterol: 50 mg/dL (ref 0–99)
Total CHOL/HDL Ratio: 2.7 ratio
Triglycerides: 61 mg/dL (ref <150)
VLDL: 12 mg/dL (ref 0–40)

## 2024-02-17 LAB — CBC
HCT: 34.8 % — ABNORMAL LOW (ref 39.0–52.0)
Hemoglobin: 11.1 g/dL — ABNORMAL LOW (ref 13.0–17.0)
MCH: 24.6 pg — ABNORMAL LOW (ref 26.0–34.0)
MCHC: 31.9 g/dL (ref 30.0–36.0)
MCV: 77 fL — ABNORMAL LOW (ref 80.0–100.0)
Platelets: 221 10*3/uL (ref 150–400)
RBC: 4.52 MIL/uL (ref 4.22–5.81)
RDW: 16.1 % — ABNORMAL HIGH (ref 11.5–15.5)
WBC: 8.3 10*3/uL (ref 4.0–10.5)
nRBC: 0 % (ref 0.0–0.2)

## 2024-02-17 LAB — HEMOGLOBIN A1C
Hgb A1c MFr Bld: 6.4 % — ABNORMAL HIGH (ref 4.8–5.6)
Mean Plasma Glucose: 136.98 mg/dL

## 2024-02-17 LAB — ECHOCARDIOGRAM COMPLETE
AR max vel: 3.57 cm2
AV Area VTI: 3.87 cm2
AV Area mean vel: 3.42 cm2
AV Mean grad: 2 mmHg
AV Peak grad: 4.1 mmHg
Ao pk vel: 1.01 m/s
Area-P 1/2: 2.11 cm2
Calc EF: 67.7 %
Height: 69 in
MV VTI: 3.97 cm2
S' Lateral: 2.7 cm
Single Plane A2C EF: 64.7 %
Single Plane A4C EF: 69.9 %
Weight: 3596.8 [oz_av]

## 2024-02-17 LAB — BASIC METABOLIC PANEL WITH GFR
Anion gap: 10 (ref 5–15)
BUN: 10 mg/dL (ref 8–23)
CO2: 24 mmol/L (ref 22–32)
Calcium: 8.6 mg/dL — ABNORMAL LOW (ref 8.9–10.3)
Chloride: 104 mmol/L (ref 98–111)
Creatinine, Ser: 0.71 mg/dL (ref 0.61–1.24)
GFR, Estimated: >60 mL/min (ref >60)
Glucose, Bld: 112 mg/dL — ABNORMAL HIGH (ref 70–99)
Potassium: 3.6 mmol/L (ref 3.5–5.1)
Sodium: 138 mmol/L (ref 135–145)

## 2024-02-17 LAB — TSH: TSH: 3.041 u[IU]/mL (ref 0.350–4.500)

## 2024-02-17 LAB — GLUCOSE, CAPILLARY
Glucose-Capillary: 119 mg/dL — ABNORMAL HIGH (ref 70–99)
Glucose-Capillary: 141 mg/dL — ABNORMAL HIGH (ref 70–99)

## 2024-02-17 LAB — HEPARIN LEVEL (UNFRACTIONATED): Heparin Unfractionated: 0.15 [IU]/mL — ABNORMAL LOW (ref 0.30–0.70)

## 2024-02-17 SURGERY — LEFT HEART CATH AND CORONARY ANGIOGRAPHY
Anesthesia: LOCAL

## 2024-02-17 MED ORDER — HEPARIN (PORCINE) IN NACL 1000-0.9 UT/500ML-% IV SOLN
INTRAVENOUS | Status: DC | PRN
  Administered 2024-02-17 (×2): 500 mL

## 2024-02-17 MED ORDER — HEPARIN SODIUM (PORCINE) 1000 UNIT/ML IJ SOLN
INTRAMUSCULAR | Status: AC
  Filled 2024-02-17: qty 10

## 2024-02-17 MED ORDER — SODIUM CHLORIDE 0.9 % IV SOLN: 250.0000 mL | INTRAVENOUS | Status: DC | PRN

## 2024-02-17 MED ORDER — IOHEXOL 350 MG/ML SOLN
INTRAVENOUS | Status: DC | PRN
  Administered 2024-02-17: 230 mL

## 2024-02-17 MED ORDER — LIDOCAINE HCL (PF) 1 % IJ SOLN
INTRAMUSCULAR | Status: DC | PRN
  Administered 2024-02-17: 2 mL via INTRADERMAL

## 2024-02-17 MED ORDER — HEPARIN SODIUM (PORCINE) 1000 UNIT/ML IJ SOLN
INTRAMUSCULAR | Status: DC | PRN
  Administered 2024-02-17: 5000 [IU] via INTRAVENOUS
  Administered 2024-02-17: 3000 [IU] via INTRAVENOUS
  Administered 2024-02-17: 6000 [IU] via INTRAVENOUS
  Administered 2024-02-17: 2000 [IU] via INTRAVENOUS

## 2024-02-17 MED ORDER — LABETALOL HCL 5 MG/ML IV SOLN: 10.0000 mg | INTRAVENOUS | Status: AC | PRN

## 2024-02-17 MED ORDER — CLOPIDOGREL BISULFATE 300 MG PO TABS
ORAL_TABLET | ORAL | Status: DC | PRN
  Administered 2024-02-17: 600 mg via ORAL

## 2024-02-17 MED ORDER — VERAPAMIL HCL 2.5 MG/ML IV SOLN
INTRAVENOUS | Status: AC
  Filled 2024-02-17: qty 2

## 2024-02-17 MED ORDER — CLOPIDOGREL BISULFATE 300 MG PO TABS
ORAL_TABLET | ORAL | Status: AC
Start: 2024-02-17 — End: ?
  Filled 2024-02-17: qty 3

## 2024-02-17 MED ORDER — SODIUM CHLORIDE 0.9% FLUSH: 3.0000 mL | INTRAVENOUS | Status: DC | PRN

## 2024-02-17 MED ORDER — FAMOTIDINE IN NACL 20-0.9 MG/50ML-% IV SOLN
INTRAVENOUS | Status: AC
  Filled 2024-02-17: qty 50

## 2024-02-17 MED ORDER — LIDOCAINE HCL (PF) 1 % IJ SOLN
INTRAMUSCULAR | Status: AC
  Filled 2024-02-17: qty 30

## 2024-02-17 MED ORDER — METOPROLOL TARTRATE 25 MG PO TABS
25.0000 mg | ORAL_TABLET | Freq: Two times a day (BID) | ORAL | Status: DC
  Administered 2024-02-17 – 2024-02-18 (×3): 25 mg via ORAL
  Filled 2024-02-17 (×3): qty 1

## 2024-02-17 MED ORDER — SODIUM CHLORIDE 0.9 % IV SOLN: INTRAVENOUS | Status: AC

## 2024-02-17 MED ORDER — CYCLOBENZAPRINE HCL 10 MG PO TABS
5.0000 mg | ORAL_TABLET | Freq: Three times a day (TID) | ORAL | Status: AC | PRN
  Administered 2024-02-17: 5 mg via ORAL
  Filled 2024-02-17: qty 1

## 2024-02-17 MED ORDER — VERAPAMIL HCL 2.5 MG/ML IV SOLN
INTRAVENOUS | Status: DC | PRN
  Administered 2024-02-17: 10 mL via INTRA_ARTERIAL

## 2024-02-17 MED ORDER — FAMOTIDINE IN NACL 20-0.9 MG/50ML-% IV SOLN
INTRAVENOUS | Status: AC | PRN
  Administered 2024-02-17: 20 mg via INTRAVENOUS

## 2024-02-17 MED ORDER — FENTANYL CITRATE (PF) 100 MCG/2ML IJ SOLN
INTRAMUSCULAR | Status: AC
  Filled 2024-02-17: qty 2

## 2024-02-17 MED ORDER — FENTANYL CITRATE (PF) 100 MCG/2ML IJ SOLN
INTRAMUSCULAR | Status: DC | PRN
  Administered 2024-02-17: 25 ug via INTRAVENOUS

## 2024-02-17 MED ORDER — HYDRALAZINE HCL 20 MG/ML IJ SOLN: 10.0000 mg | INTRAMUSCULAR | Status: AC | PRN

## 2024-02-17 MED ORDER — PERFLUTREN LIPID MICROSPHERE
1.0000 mL | INTRAVENOUS | Status: AC | PRN
  Administered 2024-02-17: 3 mL via INTRAVENOUS

## 2024-02-17 MED ORDER — SODIUM CHLORIDE 0.9% FLUSH
3.0000 mL | Freq: Two times a day (BID) | INTRAVENOUS | Status: DC
  Administered 2024-02-18: 3 mL via INTRAVENOUS

## 2024-02-17 SURGICAL SUPPLY — 18 items
BALL SAPPHIRE NC24 3.0X15 (BALLOONS) ×1 IMPLANT
BALLN EMERGE MR 2.0X12 (BALLOONS) ×1 IMPLANT
BALLN ~~LOC~~ EMERGE MR 3.5X20 (BALLOONS) ×1 IMPLANT
BALLOON EMERGE MR 2.0X12 (BALLOONS) IMPLANT
BALLOON SAPPHIRE NC24 3.0X15 (BALLOONS) IMPLANT
BALLOON ~~LOC~~ EMERGE MR 3.5X20 (BALLOONS) IMPLANT
CATH 5FR JL3.5 JR4 ANG PIG MP (CATHETERS) IMPLANT
CATH SHOCKWAVE C2 3.0X12 (CATHETERS) IMPLANT
CATH VISTA GUIDE 6FR XBLD 3.5 (CATHETERS) IMPLANT
DEVICE RAD COMP TR BAND LRG (VASCULAR PRODUCTS) IMPLANT
GLIDESHEATH SLEND SS 6F .021 (SHEATH) IMPLANT
GUIDEWIRE INQWIRE 1.5J.035X260 (WIRE) IMPLANT
INQWIRE 1.5J .035X260CM (WIRE) ×1 IMPLANT
KIT ENCORE 26 ADVANTAGE (KITS) IMPLANT
PACK CARDIAC CATHETERIZATION (CUSTOM PROCEDURE TRAY) ×1 IMPLANT
SET ATX-X65L (MISCELLANEOUS) IMPLANT
STENT ONYX FRONTIER 3.0X30 (Permanent Stent) IMPLANT
WIRE ASAHI PROWATER 180CM (WIRE) IMPLANT

## 2024-02-17 NOTE — Progress Notes (Signed)
 Mobility Specialist Progress Note;   02/17/24 1046  Mobility  Activity Ambulated with assistance in hallway  Level of Assistance Contact guard assist, steadying assist  Assistive Device Other (Comment) (IV pole)  Distance Ambulated (ft) 400 ft  Activity Response Tolerated well  Mobility Referral Yes  Mobility visit 1 Mobility  Mobility Specialist Start Time (ACUTE ONLY) 1046  Mobility Specialist Stop Time (ACUTE ONLY) 1101  Mobility Specialist Time Calculation (min) (ACUTE ONLY) 15 min   Pt agreeable to mobility. Required MinG assistance during ambulation as pt seemed a bit unsteady on feet d/t not being OOB much. VSS throughout and no c/o when asked. Pt returned back to bed with all needs met. Wife in room.   Caesar Bookman Mobility Specialist Please contact via SecureChat or [employer] [phone]

## 2024-02-17 NOTE — Telephone Encounter (Signed)
 Patient Product/process development scientist completed.    The patient is insured through [employer]/ [employer]. Patient has Medicare and is not eligible for a copay card, but may be able to apply for patient assistance or Medicare RX Payment Plan (Patient Must reach out to their plan, if eligible for payment plan), if available.    Ran test claim for Eliquis 5 mg and the current 30 day co-pay is $47.00.  Ran test claim for Xarelto 20 mg and the current 30 day co-pay is $47.00.  This test claim was processed through [facility]- copay amounts may vary at other pharmacies due to pharmacy/plan contracts, or as the patient moves through the different stages of their insurance plan.     Roland Earl, CPHT Pharmacy Technician III Certified Patient Advocate [facility] Patient Advocate Team Direct Number: [phone]  Fax: [phone]

## 2024-02-17 NOTE — Progress Notes (Signed)
   Patient Name: Kyle Lambert Date of Encounter: 02/17/2024 [facility] Cardiologist: Kristeen Miss, MD   Interval Summary  .    Patient generally feeling well this morning. Reports some chronic back pain/stiffness. Says that he had mild chest discomfort when nursing staff sat him up in bed. Now chest pain free and not reporting dyspnea or palpitations.   Vital Signs .    Vitals:   02/16/24 2055 02/16/24 2110 02/17/24 0010 02/17/24 0408  BP:  (!) 151/88 (!) 145/93 110/74  Pulse:   91 68  Resp:  20 20 20   Temp:   98.5 F (36.9 C) 97.7 F (36.5 C)  TempSrc:   Oral Oral  SpO2:  95% 92% 94%  Weight: 102 kg     Height:        Intake/Output Summary (Last 24 hours) at 02/17/2024 0724 Last data filed at 02/17/2024 0541 Gross per 24 hour  Intake 264.92 ml  Output 350 ml  Net -85.08 ml      02/16/2024    8:55 PM 02/16/2024   12:25 PM 10/04/2023   11:46 AM  Last 3 Weights  Weight (lbs) 224 lb 12.8 oz 238 lb 221 lb 9.6 oz  Weight (kg) 101.969 kg 107.956 kg 100.517 kg      Telemetry/ECG    Sinus rhythm/bradycardia with 1st degree AVB - Personally Reviewed  Physical Exam .   GEN: No acute distress.   Neck: No JVD Cardiac: RRR, faint systolic murmur over apex. Respiratory: Clear to auscultation bilaterally. GI: Soft, nontender, non-distended  MS: No edema  Assessment & Plan .    73 year old male with history of CAD status post PCI to the RCA and mid LAD, hypertension, hyperlipidemia, diabetes admitted on 02/16/2024 for non-STEMI and A-fib.  NSTEMI Patient with known history of CAD. Last catheterization in 2020 showed patent stents in LAD and RCA, 40% proximal LAD, 40% circumflex, 50% PDA, 50% first diagonal. No admitted with chest pain at rest, concerning for unstable angina. Pain is relieved by nitroglycerin. ECG without acute ischemic changes but was noted with new afib. Troponin 11->32. LHC today given hx and symptoms Continue heparin Continue Plavix/ASA.  Anticipate P2Y12 monotherapy with DOAC after catheterization given new afib.  Continue Metoprolol Tartrate 50mg  BID. Consider consolidation to Toprol-XL at d/c.  Continue Imdur 120mg  Repeat echo pending  Hyperlipidemia LDL at goal <55. Continue Lipitor 40mg .   Lab Results  Component Value Date   CHOL 98 02/17/2024   HDL 36 (L) 02/17/2024   LDLCALC 50 02/17/2024   TRIG 61 02/17/2024   CHOLHDL 2.7 02/17/2024   New onset paroxysmal atrial fibrillation Patient admitted with chest pain also found with new onset afib and minimally elevated troponin.  Sinus rhythm this morning. No recurrent afib since patient moved from ED to 6E Continue Metoprolol Tartrate 50mg . Will need to follow HR closely with resting rates in the 50s this morning. Continue heparin pending LHC today. Transition to DOAC following.   Hypertension PTA patient taking Norvasc 5mg , Metoprolol Tartrate 25mg  BID, Lisinopril 20mg , Imdur 120mg .  Lisinopril held this admission. BP moderately elevated though normal this morning. Continue Norvasc 5mg  and Imdur 120mg .   Diabetes Continue inpatient SSI.  GERD Continue daily PPI.    For questions or updates, please contact [facility] Please consult [email or web address] for contact info under        Signed, Perlie Gold, PA-C

## 2024-02-17 NOTE — Progress Notes (Signed)
 PHARMACY - ANTICOAGULATION CONSULT NOTE  Pharmacy Consult for heparin Indication: chest pain/ACS  No Known Allergies  Patient Measurements: Height: 5\' 9"  (175.3 cm) Weight: 102 kg (224 lb 12.8 oz) IBW/kg (Calculated) : 70.7 HEPARIN DW (KG): 94.2  Vital Signs: Temp: 98.5 F (36.9 C) (03/28 0010) Temp Source: Oral (03/28 0010) BP: 145/93 (03/28 0010) Pulse Rate: 91 (03/28 0010)  Labs: Recent Labs    02/16/24 1233 02/16/24 1428 02/17/24 0405  HGB 11.6*  --  11.1*  HCT 38.1*  --  34.8*  PLT 204  --  221  LABPROT 13.9  --   --   INR 1.1  --   --   HEPARINUNFRC  --   --  0.15*  CREATININE 0.68  --  0.71  TROPONINIHS 11 32*  --     Estimated Creatinine Clearance: 96.8 mL/min (by C-G formula based on SCr of 0.71 mg/dL).  Assessment: 89 YOM who presents with concern for chest pain. He does have a history of CAD s/p DES to RCA in 2013, DES to mid LAD and balloon angioplasty in 2014. Troponin was 11 upon presentation but increased to 32 on recheck. Patient noted to be in atrial fibrillation in ED. Pharmacy has been consulted to initiate heparin for ACS.   Patient was not on any anticoagulation PTA. Scr stable at 0.68. Hgb stable at 11.6, PLT count stable 204.   AM heparin level below goal on 1200 units/hr. Per RN, no issues with heparin running continuously or signs/symptoms of bleeding  Goal of Therapy:  Heparin level 0.3-0.7 units/ml Monitor platelets by anticoagulation protocol: Yes   Plan:  Increase heparin infusion to 1500 units/hr Check anti-Xa level in 8 hours and daily while on heparin Continue to monitor H&H and platelets  Arabella Merles, PharmD. Clinical Pharmacist 02/17/2024 5:39 AM

## 2024-02-17 NOTE — H&P (View-Only) (Signed)
   Patient Name: Kyle Lambert Date of Encounter: 02/17/2024 [facility] Cardiologist: Kristeen Miss, MD   Interval Summary  .    Patient generally feeling well this morning. Reports some chronic back pain/stiffness. Says that he had mild chest discomfort when nursing staff sat him up in bed. Now chest pain free and not reporting dyspnea or palpitations.   Vital Signs .    Vitals:   02/16/24 2055 02/16/24 2110 02/17/24 0010 02/17/24 0408  BP:  (!) 151/88 (!) 145/93 110/74  Pulse:   91 68  Resp:  20 20 20   Temp:   98.5 F (36.9 C) 97.7 F (36.5 C)  TempSrc:   Oral Oral  SpO2:  95% 92% 94%  Weight: 102 kg     Height:        Intake/Output Summary (Last 24 hours) at 02/17/2024 0724 Last data filed at 02/17/2024 0541 Gross per 24 hour  Intake 264.92 ml  Output 350 ml  Net -85.08 ml      02/16/2024    8:55 PM 02/16/2024   12:25 PM 10/04/2023   11:46 AM  Last 3 Weights  Weight (lbs) 224 lb 12.8 oz 238 lb 221 lb 9.6 oz  Weight (kg) 101.969 kg 107.956 kg 100.517 kg      Telemetry/ECG    Sinus rhythm/bradycardia with 1st degree AVB - Personally Reviewed  Physical Exam .   GEN: No acute distress.   Neck: No JVD Cardiac: RRR, faint systolic murmur over apex. Respiratory: Clear to auscultation bilaterally. GI: Soft, nontender, non-distended  MS: No edema  Assessment & Plan .    73 year old male with history of CAD status post PCI to the RCA and mid LAD, hypertension, hyperlipidemia, diabetes admitted on 02/16/2024 for non-STEMI and A-fib.  NSTEMI Patient with known history of CAD. Last catheterization in 2020 showed patent stents in LAD and RCA, 40% proximal LAD, 40% circumflex, 50% PDA, 50% first diagonal. No admitted with chest pain at rest, concerning for unstable angina. Pain is relieved by nitroglycerin. ECG without acute ischemic changes but was noted with new afib. Troponin 11->32. LHC today given hx and symptoms Continue heparin Continue Plavix/ASA.  Anticipate P2Y12 monotherapy with DOAC after catheterization given new afib.  Continue Metoprolol Tartrate 50mg  BID. Consider consolidation to Toprol-XL at d/c.  Continue Imdur 120mg  Repeat echo pending  Hyperlipidemia LDL at goal <55. Continue Lipitor 40mg .   Lab Results  Component Value Date   CHOL 98 02/17/2024   HDL 36 (L) 02/17/2024   LDLCALC 50 02/17/2024   TRIG 61 02/17/2024   CHOLHDL 2.7 02/17/2024   New onset paroxysmal atrial fibrillation Patient admitted with chest pain also found with new onset afib and minimally elevated troponin.  Sinus rhythm this morning. No recurrent afib since patient moved from ED to 6E Continue Metoprolol Tartrate 50mg . Will need to follow HR closely with resting rates in the 50s this morning. Continue heparin pending LHC today. Transition to DOAC following.   Hypertension PTA patient taking Norvasc 5mg , Metoprolol Tartrate 25mg  BID, Lisinopril 20mg , Imdur 120mg .  Lisinopril held this admission. BP moderately elevated though normal this morning. Continue Norvasc 5mg  and Imdur 120mg .   Diabetes Continue inpatient SSI.  GERD Continue daily PPI.    For questions or updates, please contact [facility] Please consult [email or web address] for contact info under        Signed, Perlie Gold, PA-C

## 2024-02-17 NOTE — Progress Notes (Signed)
  Echocardiogram 2D Echocardiogram has been performed.  Ocie Doyne RDCS 02/17/2024, 12:09 PM

## 2024-02-17 NOTE — Plan of Care (Signed)

## 2024-02-17 NOTE — Discharge Instructions (Addendum)
 Medication Changes: - START Eliquis 5mg  twice daily. You will take this in addition to the Aspirin and Plavix that you are already taking. You will take all three for 1 month and then can stop the Aspirin. However, we will review this with you at your follow-up visit. - START Ferrous Sulfate (iron supplement) 325mg  twice daily.  - We initially increased your Metoprolol; however, we ended up dropping it back down to your home dose yesterday. Therefore, okay to continue home dose of Metoprolol tartrate 25mg  twice daily at discharge. - You can restart your Metformin on Monday morning (02/20/2024). - We discontinued the Gemtesa since you said you were no longer taking this on admission. If you have any questions regarding this medication, please reach out to the provider who prescribed this.  Post NSTEMI: NO HEAVY LIFTING X 2 WEEKS. NO SEXUAL ACTIVITY X 2 WEEKS. NO DRIVING X 1 WEEK. NO SOAKING BATHS, HOT TUBS, POOLS, ETC., X 7 DAYS.   Radial Site Care: Refer to this sheet in the next few weeks. These instructions provide you with information on caring for yourself after your procedure. Your caregiver may also give you more specific instructions. Your treatment has been planned according to current medical practices, but problems sometimes occur. Call your caregiver if you have any problems or questions after your procedure. HOME CARE INSTRUCTIONS You may shower the day after the procedure. Remove the bandage (dressing) and gently wash the site with plain soap and water. Gently pat the site dry.  Do not apply powder or lotion to the site.  Do not submerge the affected site in water for 3 to 5 days.  Inspect the site at least twice daily.  Do not flex or bend the affected arm for 24 hours.  No lifting over 5 pounds (2.3 kg) for 5 days after your procedure.  Do not drive home if you are discharged the same day of the procedure. Have someone else drive you.  What to expect: Any bruising will usually  fade within 1 to 2 weeks.  Blood that collects in the tissue (hematoma) may be painful to the touch. It should usually decrease in size and tenderness within 1 to 2 weeks.  SEEK IMMEDIATE MEDICAL CARE IF: You have unusual pain at the radial site.  You have redness, warmth, swelling, or pain at the radial site.  You have drainage (other than a small amount of blood on the dressing).  You have chills.  You have a fever or persistent symptoms for more than 72 hours.  You have a fever and your symptoms suddenly get worse.  Your arm becomes pale, cool, tingly, or numb.  You have heavy bleeding from the site. Hold pressure on the site.   Information on my medicine - ELIQUIS (apixaban)  Why was Eliquis prescribed for you? Eliquis was prescribed for you to reduce the risk of a blood clot forming that can cause a stroke if you have a medical condition called atrial fibrillation (a type of irregular heartbeat).  What do You need to know about Eliquis ? Take your Eliquis TWICE DAILY - one tablet in the morning and one tablet in the evening with or without food. If you have difficulty swallowing the tablet whole please discuss with your pharmacist how to take the medication safely.  Take Eliquis exactly as prescribed by your doctor and DO NOT stop taking Eliquis without talking to the doctor who prescribed the medication.  Stopping may increase your risk of developing a  stroke.  Refill your prescription before you run out.  After discharge, you should have regular check-up appointments with your healthcare provider that is prescribing your Eliquis.  In the future your dose may need to be changed if your kidney function or weight changes by a significant amount or as you get older.  What do you do if you miss a dose? If you miss a dose, take it as soon as you remember on the same day and resume taking twice daily.  Do not take more than one dose of ELIQUIS at the same time to make up a missed  dose.  Important Safety Information A possible side effect of Eliquis is bleeding. You should call your healthcare provider right away if you experience any of the following: Bleeding from an injury or your nose that does not stop. Unusual colored urine (red or dark brown) or unusual colored stools (red or black). Unusual bruising for unknown reasons. A serious fall or if you hit your head (even if there is no bleeding).  Some medicines may interact with Eliquis and might increase your risk of bleeding or clotting while on Eliquis. To help avoid this, consult your healthcare provider or pharmacist prior to using any new prescription or non-prescription medications, including herbals, vitamins, non-steroidal anti-inflammatory drugs (NSAIDs) and supplements.  This website has more information on Eliquis (apixaban): http://www.eliquis.com/eliquis/home

## 2024-02-17 NOTE — TOC CM/SW Note (Signed)
 Transition of Care [facility]) - Inpatient Brief Assessment   Patient Details  Name: Kyle Lambert MRN: [id number] Date of Birth: [birth year]  Transition [facility] CM/SW Contact:    Gala Lewandowsky, RN Phone Number: 02/17/2024, 2:58 PM   Clinical Narrative: Patient presented for chest pain-post LHC. Patient has insurance and PCP. Case Manager will continue to follow for transition of care needs as the patient progresses.    Transition of Care Asessment: Insurance and Status: Insurance coverage has been reviewed Patient has primary care physician: Yes Home environment has been reviewed: reviewed Prior level of function:: independent Social Drivers of Health Review: SDOH reviewed no interventions necessary Readmission risk has been reviewed: Yes Transition of care needs: no transition of care needs at this time

## 2024-02-17 NOTE — Interval H&P Note (Signed)
 History and Physical Interval Note:  02/17/2024 12:24 PM  Kyle Lambert  has presented today for surgery, with the diagnosis of unstable angina.  The various methods of treatment have been discussed with the patient and family. After consideration of risks, benefits and other options for treatment, the patient has consented to  Procedure(s): LEFT HEART CATH AND CORONARY ANGIOGRAPHY (N/A) as a surgical intervention.  The patient's history has been reviewed, patient examined, no change in status, stable for surgery.  I have reviewed the patient's chart and labs.  Questions were answered to the patient's satisfaction.    Cath Lab Visit (complete for each Cath Lab visit)  Clinical Evaluation Leading to the Procedure:   ACS: Yes.    Non-ACS:    Anginal Classification: CCS III  Anti-ischemic medical therapy: Maximal Therapy (2 or more classes of medications)  Non-Invasive Test Results: No non-invasive testing performed  Prior CABG: No previous CABG    Verne Carrow

## 2024-02-17 NOTE — Discharge Summary (Addendum)
 Discharge Summary    Patient ID: Kyle Lambert MRN: [id number]; DOB: [birth year]  Admit date: 02/16/2024 Discharge date: 02/18/2024  PCP:  Juliette Alcide, MD   [facility] Providers Cardiologist:  Kristeen Miss, MD        Discharge Diagnoses    Principal Problem:   Non-ST elevation (NSTEMI) myocardial infarction [facility]) Active Problems:   CAD (coronary artery disease)   Atrial fibrillation with RVR (HCC)   Essential hypertension   Mixed hyperlipidemia   Type 2 diabetes mellitus (HCC)   Iron deficiency anemia    Diagnostic Studies/Procedures    Echocardiogram 02/17/2024: Impressions: 1. Left ventricular ejection fraction, by estimation, is 60 to 65%. The  left ventricle has normal function. The left ventricle has no regional  wall motion abnormalities. Left ventricular diastolic parameters were  normal.   2. Right ventricular systolic function is normal. The right ventricular  size is normal.   3. The mitral valve is normal in structure. Trivial mitral valve  regurgitation. No evidence of mitral stenosis.   4. The aortic valve is tricuspid. Aortic valve regurgitation is not  visualized. No aortic stenosis is present.  _____________  Left Cardiac Catheterization 02/17/2024:   Mid Cx to Dist Cx lesion is 40% stenosed.   Ost 1st Mrg to 1st Mrg lesion is 60% stenosed.   Dist LM to Ost LAD lesion is 90% stenosed.   Prox LAD lesion is 70% stenosed.   Previously placed Mid LAD stent of unknown type is  widely patent.   A drug-eluting stent was successfully placed using a STENT ONYX FRONTIER 3.0X30.   Severe heavily calcified ostial/proximal LAD stenosis.  Successful PTCA/Shockwave IC lithotripsy/DES x 1 ostial/proximal LAD Patent distal LAD stent Moderate non-obstructive disease in the diagonal branch The Circumflex is a large caliber vessel with a large obtuse marginal branch. The obtuse marginal branch has moderate proximal stenosis.  The large, dominant RCA  has a patent proximal stented segment with minimal restenosis.  Moderate stenosis in the PDA LVEDP=13 mmHg   Recommendations: Continue DAPT with ASA and Plavix for one month. Then stop ASA and continue Plavix along with Eliquis. (Discharge on triple therapy: ASA, Plavix, Eliquis). Medical management of moderate stenosis in the obtuse marginal branch.  Diagnostic Dominance: Right  Intervention       History of Present Illness     Kyle Lambert is a 73 y.o. male with a history of CAD s/p DES to RCA in 2013 and then DES to LAD and balloon angiplasty to 1st Diag in 2014, hypertension, hyperlipidemia, type 2 diabetes mellitus who presented on 02/16/2024 for further evaluation of chest pain.  He reported multiple episodes of chest pain that radiated to his jaw over the last 4 days. Chest pain was not associated with exertion or respiration. He initially had relief with Nitroglycerin but had no improvement with Nitroglycerin on day of admission. He also reported worsening fatigue recently. He denied any diaphoresis, shortness of breath, edema, palpitations, or lightheadedness.   Upon arrival to the ED, EKG showed new onset atrial fibrillation, rate of 105 bpm, with non-specific T wave changes. High-sensitivity troponin 11 > 32. Chest x-ray showed no acute findings. WBC 8.3, Hgb 11.6, Plts 204. Na 138, K 3.6, Glucose 116, BUN 8, Cr 0.68, Ca 8.6. Total Protein 6.4, Albumin 3.9, AST 21, ALT 19, Alk Phos 69, Total Bili 0.9, Anion gap 16. He was started on IV Heparin and admitted for NSTEMI.   Hospital Course  Consultants: None   NSTEMI CAD Patient presented with chest pain with radiation to jaw as described above. High-sensitivity troponin 11 >> 32. Echo showed LVEF of 60-65% with normal wall motion and diastolic function, normal RV function, and no significant valvular disease. LHC showed 90% stenosis of distal left main and ostial LAD, 70% stenosis of proximal LAD, and 40% stenosis of mid to  distal LCX. Prior stent to RCA and LAD patent. He underwent successful PCI with PTCA/ shockwave IC lithotripsy/ DES to ostial to proximal LAD. He tolerated the procedure well. Plan is for triple therapy with Aspirin 81mg  daily, Plavix 75mg  daily, and Eliquis 5mg  twice daily for 1 month at which time Aspirin can be stopped. Home Lopressor 25mg  twice daily, Imdur 120mg  daily, and Lipitor 40mg  daily were continued.  New Onset Atrial Fibrillation Patient was noted to be in new onset atrial fibrillation with slightly elevated rates. Magnesium was low at 1.5 and was repleted. Potassium and TSH were normal. Echo showed normal LV function. His home Lopresor was briefly increased and he converted back to sinus rhythm spontaneously with low resting heart rates. Therefore, Lopressor was decreased back to home dose of 25mg  twice daily. CHA2D2-VASc = 4 (CAD, HTN, DM, age). She was started on IV Heparin initially but was transitioned to Eliquis 5mg  twice daily prior to discharge.   Hypertension BP soft at times during admission but improved on day of discharge. Continue home Lopressor 25mg  twice daily, Amlodipine 5mg  daily, and Imdur 120mg  daily.  Hyperlipidemia Lipid panel this admission: Total Cholesterol 98, Triglycerides 61, HDL 36, LDL 50. LDL goal <55. Lipoprotein (a) pending. Continue home Lipitor 40mg  daily.   Type 2 Diabetes Mellitus Hemoglobin A1c 6.4%. Patient was placed on sliding scale insulin during admission. Can resume home Metformin 48 hours after cardiac catheterization.  Iron Deficiency Anemia Baseline hemoglobin in the 10 to 11 range. Hemoglobin was 11.6 on admission and dropped to 9.5 on day of discharge. No overt bleeding. Patient reports he had a colonoscopy about 3 years ago which was normal. Iron panel showed low iron of 30 and normal TIBC of 430. Ferritin was low at 7. Folate normal and vitamin B12 pending. Will start Ferrous Sulfate 325mg  twice daily at discharge. Will need to monitor  hemoglobin closely while on triple therapy. Recommend repeat CBC at follow-up visit.  Patient seen and examined by Dr. Royann Shivers today and felt to be stable for discharge. Outpatient follow-up arranged. Medications as below.    Did the patient have an acute coronary syndrome (MI, NSTEMI, STEMI, etc) this admission?:  Yes                               AHA/ACC ACS Clinical Performance & Quality Measures: Aspirin prescribed? - Yes ADP Receptor Inhibitor (Plavix/Clopidogrel, Brilinta/Ticagrelor or Effient/Prasugrel) prescribed (includes medically managed patients)? - Yes Beta Blocker prescribed? - Yes High Intensity Statin (Lipitor 40-80mg  or Crestor 20-40mg ) prescribed? - Yes EF assessed during THIS hospitalization? - Yes For EF <40%, was ACEI/ARB prescribed? - Not Applicable (EF >/= 40%) For EF <40%, Aldosterone Antagonist (Spironolactone or Eplerenone) prescribed? - Not Applicable (EF >/= 40%) Cardiac Rehab Phase II ordered (including medically managed patients)? - Yes    The patient will be scheduled for a TOC follow up appointment within 14 days.  A message has been sent to the [facility] and Scheduling Pool at the office where the patient should be seen for follow up.  _____________  Discharge Vitals Blood pressure 129/87, pulse 71, temperature 97.8 F (36.6 C), temperature source Oral, resp. rate 20, height 5\' 9"  (1.753 m), weight 102 kg, SpO2 98%.  Filed Weights   02/16/24 1225 02/16/24 2055  Weight: 108 kg 102 kg    Labs & Radiologic Studies    CBC Recent Labs    02/17/24 0405 02/18/24 0309  WBC 8.3 8.1  HGB 11.1* 9.5*  HCT 34.8* 29.8*  MCV 77.0* 77.4*  PLT 221 241   Basic Metabolic Panel Recent Labs    65/78/46 1233 02/17/24 0405 02/18/24 0309  NA 138 138 136  K 3.6 3.6 3.7  CL 103 104 106  CO2 19* 24 22  GLUCOSE 116* 112* 97  BUN 8 10 12   CREATININE 0.68 0.71 0.80  CALCIUM 8.6* 8.6* 8.4*  MG 1.5*  --   --    Liver Function Tests Recent Labs     02/16/24 1233  AST 21  ALT 19  ALKPHOS 69  BILITOT 0.9  PROT 6.4*  ALBUMIN 3.9   No results for input(s): "LIPASE", "AMYLASE" in the last 72 hours. High Sensitivity Troponin:   Recent Labs  Lab 02/16/24 1233 02/16/24 1428  TROPONINIHS 11 32*    BNP Invalid input(s): "POCBNP" D-Dimer No results for input(s): "DDIMER" in the last 72 hours. Hemoglobin A1C Recent Labs    02/17/24 0405  HGBA1C 6.4*   Fasting Lipid Panel Recent Labs    02/17/24 0405  CHOL 98  HDL 36*  LDLCALC 50  TRIG 61  CHOLHDL 2.7   Thyroid Function Tests Recent Labs    02/17/24 0405  TSH 3.041   _____________  CARDIAC CATHETERIZATION Result Date: 02/17/2024   Mid Cx to Dist Cx lesion is 40% stenosed.   Ost 1st Mrg to 1st Mrg lesion is 60% stenosed.   Dist LM to Ost LAD lesion is 90% stenosed.   Prox LAD lesion is 70% stenosed.   Previously placed Mid LAD stent of unknown type is  widely patent.   A drug-eluting stent was successfully placed using a STENT ONYX FRONTIER 3.0X30. Severe heavily calcified ostial/proximal LAD stenosis. Successful PTCA/Shockwave IC lithotripsy/DES x 1 ostial/proximal LAD Patent distal LAD stent Moderate non-obstructive disease in the diagonal branch The Circumflex is a large caliber vessel with a large obtuse marginal branch. The obtuse marginal branch has moderate proximal stenosis. The large, dominant RCA has a patent proximal stented segment with minimal restenosis. Moderate stenosis in the PDA LVEDP=13 mmHg Recommendations: Continue DAPT with ASA and Plavix for one month. Then stop ASA and continue Plavix along with Eliquis. (Discharge on triple therapy: ASA, Plavix, Eliquis). Medical management of moderate stenosis in the obtuse marginal branch.   ECHOCARDIOGRAM COMPLETE Result Date: 02/17/2024    ECHOCARDIOGRAM REPORT   Patient Name:   Kyle Lambert Date of Exam: 02/17/2024 Medical Rec #:  [id number]     Height:       69.0 in Accession #:    [id number]    Weight:        224.8 lb Date of Birth:  [birth year]     BSA:          2.171 m Patient Age:    72 years      BP:           121/69 mmHg Patient Gender: M             HR:           61 bpm.  Exam Location:  Inpatient Procedure: 2D Echo, Cardiac Doppler, Color Doppler and Intracardiac            Opacification Agent (Both Spectral and Color Flow Doppler were            utilized during procedure). Indications:    Chest pain  History:        Patient has prior history of Echocardiogram examinations, most                 recent 06/07/2023. CAD; Risk Factors:Diabetes, Hypertension and                 Dyslipidemia. NSTEMI.  Sonographer:    Vern Claude Referring Phys: Arabella Merles  Sonographer Comments: Suboptimal subcostal window, suboptimal parasternal window and suboptimal apical window. IMPRESSIONS  1. Left ventricular ejection fraction, by estimation, is 60 to 65%. The left ventricle has normal function. The left ventricle has no regional wall motion abnormalities. Left ventricular diastolic parameters were normal.  2. Right ventricular systolic function is normal. The right ventricular size is normal.  3. The mitral valve is normal in structure. Trivial mitral valve regurgitation. No evidence of mitral stenosis.  4. The aortic valve is tricuspid. Aortic valve regurgitation is not visualized. No aortic stenosis is present. FINDINGS  Left Ventricle: Left ventricular ejection fraction, by estimation, is 60 to 65%. The left ventricle has normal function. The left ventricle has no regional wall motion abnormalities. The left ventricular internal cavity size was normal in size. There is  no left ventricular hypertrophy. Left ventricular diastolic parameters were normal. Right Ventricle: The right ventricular size is normal. No increase in right ventricular wall thickness. Right ventricular systolic function is normal. Left Atrium: Left atrial size was normal in size. Right Atrium: Right atrial size was normal in size. Pericardium: Trivial  pericardial effusion is present. Mitral Valve: The mitral valve is normal in structure. Trivial mitral valve regurgitation. No evidence of mitral valve stenosis. MV peak gradient, 1.8 mmHg. The mean mitral valve gradient is 1.0 mmHg. Tricuspid Valve: The tricuspid valve is not well visualized. Tricuspid valve regurgitation is trivial. No evidence of tricuspid stenosis. Aortic Valve: The aortic valve is tricuspid. Aortic valve regurgitation is not visualized. No aortic stenosis is present. Aortic valve mean gradient measures 2.0 mmHg. Aortic valve peak gradient measures 4.1 mmHg. Aortic valve area, by VTI measures 3.87 cm. Pulmonic Valve: The pulmonic valve was not well visualized. Pulmonic valve regurgitation is not visualized. No evidence of pulmonic stenosis. Aorta: The aortic root and ascending aorta are structurally normal, with no evidence of dilitation. Venous: The inferior vena cava was not well visualized. IAS/Shunts: The interatrial septum was not well visualized.  LEFT VENTRICLE PLAX 2D LVIDd:         4.20 cm      Diastology LVIDs:         2.70 cm      LV e' medial:    5.98 cm/s LV PW:         1.00 cm      LV E/e' medial:  10.1 LV IVS:        0.90 cm      LV e' lateral:   6.09 cm/s LVOT diam:     2.10 cm      LV E/e' lateral: 9.9 LV SV:         76 LV SV Index:   35 LVOT Area:     3.46 cm  LV Volumes (MOD) LV vol d,  MOD A2C: 81.7 ml LV vol d, MOD A4C: 157.0 ml LV vol s, MOD A2C: 28.8 ml LV vol s, MOD A4C: 47.2 ml LV SV MOD A2C:     52.9 ml LV SV MOD A4C:     157.0 ml LV SV MOD BP:      77.5 ml RIGHT VENTRICLE RV Basal diam:  3.60 cm RV Mid diam:    1.40 cm RV S prime:     10.00 cm/s LEFT ATRIUM             Index        RIGHT ATRIUM          Index LA diam:        3.00 cm 1.38 cm/m   RA Area:     8.44 cm LA Vol (A2C):   64.7 ml 29.80 ml/m  RA Volume:   11.60 ml 5.34 ml/m LA Vol (A4C):   32.5 ml 14.97 ml/m LA Biplane Vol: 49.9 ml 22.98 ml/m  AORTIC VALVE                    PULMONIC VALVE AV Area  (Vmax):    3.57 cm     PV Vmax:       0.84 m/s AV Area (Vmean):   3.42 cm     PV Peak grad:  2.8 mmHg AV Area (VTI):     3.87 cm AV Vmax:           101.00 cm/s AV Vmean:          73.100 cm/s AV VTI:            0.195 m AV Peak Grad:      4.1 mmHg AV Mean Grad:      2.0 mmHg LVOT Vmax:         104.00 cm/s LVOT Vmean:        72.100 cm/s LVOT VTI:          0.218 m LVOT/AV VTI ratio: 1.12  AORTA Ao Root diam: 3.20 cm Ao Asc diam:  3.10 cm MITRAL VALVE MV Area (PHT): 2.11 cm    SHUNTS MV Area VTI:   3.97 cm    Systemic VTI:  0.22 m MV Peak grad:  1.8 mmHg    Systemic Diam: 2.10 cm MV Mean grad:  1.0 mmHg MV Vmax:       0.67 m/s MV Vmean:      41.4 cm/s MV Decel Time: 359 msec MV E velocity: 60.30 cm/s MV A velocity: 71.80 cm/s MV E/A ratio:  0.84 Sunit Tolia Electronically signed by Tessa Lerner Signature Date/Time: 02/17/2024/12:45:21 PM    Final    DG Chest Portable 1 View Result Date: 02/16/2024 CLINICAL DATA:  Chest pain. EXAM: PORTABLE CHEST 1 VIEW COMPARISON:  Chest radiograph dated 04/19/2020. FINDINGS: The heart size and mediastinal contours are within normal limits. No focal consolidation, sizeable pleural effusion, or pneumothorax. No acute osseous abnormality. IMPRESSION: No acute cardiopulmonary findings. Electronically Signed   By: Hart Robinsons M.D.   On: 02/16/2024 13:30   Disposition   Patient is being discharged home today in good condition.  Follow-up Plans & Appointments     Follow-up Information     Beatrice Lecher, PA-C Follow up.   Specialties: Cardiology, Physician Assistant Why: Hospital follow-up with Cardiology scheduled for 02/28/2024 at 8:25am. Please arrive 15 minutes early for check-in. If this date/ time does not work for you, pleaes call our office to  reschedule. Contact information: [address] [place] [address] [phone]                Discharge Instructions     Amb Referral to Cardiac Rehabilitation   Complete by: As  directed    Diagnosis: NSTEMI   After initial evaluation and assessments completed: Virtual Based Care may be provided alone or in conjunction with Phase 2 Cardiac Rehab based on patient barriers.: Yes   Intensive Cardiac Rehabilitation (ICR) [facility] location only OR Traditional Cardiac Rehabilitation (TCR) *If criteria for ICR are not met will enroll in TCR [facility] only): Yes   Diet - low sodium heart healthy   Complete by: As directed    Increase activity slowly   Complete by: As directed         Discharge Medications   Allergies as of 02/18/2024   No Known Allergies      Medication List     PAUSE taking these medications    metFORMIN 1000 MG tablet Wait to take this until: February 20, 2024 Morning Commonly known as: GLUCOPHAGE Take 1,000 mg by mouth 2 (two) times daily.       STOP taking these medications    Gemtesa 75 MG Tabs Generic drug: Vibegron       TAKE these medications    amLODipine 5 MG tablet Commonly known as: NORVASC TAKE 1 TABLET DAILY   apixaban 5 MG Tabs tablet Commonly known as: ELIQUIS Take 1 tablet (5 mg total) by mouth 2 (two) times daily.   aspirin EC 81 MG tablet Take 81 mg by mouth daily.   atorvastatin 40 MG tablet Commonly known as: LIPITOR Take 40 mg by mouth at bedtime.   clopidogrel 75 MG tablet Commonly known as: PLAVIX Take 1 tablet (75 mg total) by mouth daily.   cyclobenzaprine 10 MG tablet Commonly known as: FLEXERIL Take 10 mg by mouth daily as needed for muscle spasms.   Iron 325 (65 Fe) MG Tabs Take 1 tablet (325 mg total) by mouth 2 (two) times daily.   isosorbide mononitrate 120 MG 24 hr tablet Commonly known as: Imdur Take 1 tablet (120 mg total) by mouth daily.   metoprolol tartrate 25 MG tablet Commonly known as: LOPRESSOR TAKE 1 TABLET TWICE A DAY What changed: when to take this   nitroGLYCERIN 0.4 MG SL tablet Commonly known as: NITROSTAT Place 1 tablet (0.4 mg total) under the tongue every 5 (five)  minutes x 3 doses as needed for chest pain.   pantoprazole 40 MG tablet Commonly known as: PROTONIX TAKE 1 TABLET BY MOUTH ONCE DAILY --  NEEDS  APPOINTMENT  FOR  FURTHER  REFILLS What changed: See the new instructions.   PRESERVISION AREDS 2 PO Take 1 tablet by mouth in the morning and at bedtime.   tamsulosin 0.4 MG Caps capsule Commonly known as: FLOMAX Take 1 capsule (0.4 mg total) by mouth 2 (two) times daily.           Outstanding Labs/Studies   Repeat CBC at follow-up visit.  Duration of Discharge Encounter: APP Time: 25 minutes   Signed, Corrin Parker, PA-C 02/18/2024, 10:57 AM   I have seen and examined the patient along with Corrin Parker, PA-C, PA NP.  I have reviewed the chart, notes and new data.  I agree with PA/NP's note.  Key new complaints: He feels well and is eager to go home.  Walked almost 500 feet without problems today, normal  oxygen saturation and no dyspnea or chest pain. Key examination changes: Remains in normal sinus rhythm.  No overt signs of hypervolemia Key new findings / data: Workup for his mild microcytic anemia shows that he does indeed have iron deficiency with low iron and low ferritin levels.  Etiology is not clear.  He has had a fairly recent colonoscopy and has no history of dyspepsia, abdominal pain or overt GI bleeding.   PLAN: Will send him home with oral iron supplements.  Need to watch this closely since he will be on aspirin, clopidogrel and direct oral anticoagulant.  Low threshold to discontinue the aspirin with any evidence of GI bleeding or other bleeding. Again reviewed the importance of diet and exercise and went over the role of his medications.  Thurmon Fair, MD, [facility] [phone] 02/18/2024, 12:22 PM  Physician discharge time 35 minutes.

## 2024-02-18 ENCOUNTER — Other Ambulatory Visit (HOSPITAL_COMMUNITY): Payer: Self-pay

## 2024-02-18 ENCOUNTER — Other Ambulatory Visit: Payer: Self-pay

## 2024-02-18 ENCOUNTER — Telehealth: Payer: Self-pay | Admitting: Student

## 2024-02-18 DIAGNOSIS — I214 Non-ST elevation (NSTEMI) myocardial infarction: Secondary | ICD-10-CM | POA: Diagnosis not present

## 2024-02-18 DIAGNOSIS — D509 Iron deficiency anemia, unspecified: Secondary | ICD-10-CM | POA: Insufficient documentation

## 2024-02-18 LAB — VITAMIN B12: Vitamin B-12: 60 pg/mL — ABNORMAL LOW (ref 180–914)

## 2024-02-18 LAB — FERRITIN: Ferritin: 7 ng/mL — ABNORMAL LOW (ref 24–336)

## 2024-02-18 LAB — CBC
HCT: 29.8 % — ABNORMAL LOW (ref 39.0–52.0)
Hemoglobin: 9.5 g/dL — ABNORMAL LOW (ref 13.0–17.0)
MCH: 24.7 pg — ABNORMAL LOW (ref 26.0–34.0)
MCHC: 31.9 g/dL (ref 30.0–36.0)
MCV: 77.4 fL — ABNORMAL LOW (ref 80.0–100.0)
Platelets: 241 10*3/uL (ref 150–400)
RBC: 3.85 MIL/uL — ABNORMAL LOW (ref 4.22–5.81)
RDW: 16.3 % — ABNORMAL HIGH (ref 11.5–15.5)
WBC: 8.1 10*3/uL (ref 4.0–10.5)
nRBC: 0 % (ref 0.0–0.2)

## 2024-02-18 LAB — BASIC METABOLIC PANEL WITH GFR
Anion gap: 8 (ref 5–15)
BUN: 12 mg/dL (ref 8–23)
CO2: 22 mmol/L (ref 22–32)
Calcium: 8.4 mg/dL — ABNORMAL LOW (ref 8.9–10.3)
Chloride: 106 mmol/L (ref 98–111)
Creatinine, Ser: 0.8 mg/dL (ref 0.61–1.24)
GFR, Estimated: >60 mL/min (ref >60)
Glucose, Bld: 97 mg/dL (ref 70–99)
Potassium: 3.7 mmol/L (ref 3.5–5.1)
Sodium: 136 mmol/L (ref 135–145)

## 2024-02-18 LAB — RETICULOCYTES
Immature Retic Fract: 13.9 % (ref 2.3–15.9)
RBC.: 3.81 MIL/uL — ABNORMAL LOW (ref 4.22–5.81)
Retic Count, Absolute: 48.4 10*3/uL (ref 19.0–186.0)
Retic Ct Pct: 1.3 % (ref 0.4–3.1)

## 2024-02-18 LAB — GLUCOSE, CAPILLARY
Glucose-Capillary: 105 mg/dL — ABNORMAL HIGH (ref 70–99)
Glucose-Capillary: 191 mg/dL — ABNORMAL HIGH (ref 70–99)

## 2024-02-18 LAB — IRON AND TIBC
Iron: 30 ug/dL — ABNORMAL LOW (ref 45–182)
Saturation Ratios: 7 % — ABNORMAL LOW (ref 17.9–39.5)
TIBC: 430 ug/dL (ref 250–450)
UIBC: 400 ug/dL

## 2024-02-18 LAB — LIPOPROTEIN A (LPA): Lipoprotein (a): 10.9 nmol/L (ref <75.0)

## 2024-02-18 LAB — FOLATE: Folate: 18.4 ng/mL (ref >5.9)

## 2024-02-18 MED ORDER — APIXABAN 5 MG PO TABS
5.0000 mg | ORAL_TABLET | Freq: Two times a day (BID) | ORAL | 12 refills | Status: AC
  Filled 2024-02-18: qty 60, 30d supply, fill #0

## 2024-02-18 MED ORDER — IRON 325 (65 FE) MG PO TABS
1.0000 | ORAL_TABLET | Freq: Two times a day (BID) | ORAL | 1 refills | Status: AC
  Filled 2024-02-18: qty 60, 30d supply, fill #0

## 2024-02-18 NOTE — Progress Notes (Signed)
 CARDIAC REHAB PHASE I   PRE:  Rate/Rhythm: 73  BP:  Sit: 129/87     SaO2: 99 RA  MODE:  Ambulation: 470 ft   POST:  Rate/Rhythm: 75  BP:  Sit: 108/60 Mild lightheaded when sitting.    Repeat 5 min post. Symptoms resolved. 108/66       SaO2: 99RA  Pt tolerated exercise well and AMB 470 ft with no assistive device, and standby assist. Pt had no rest break, chest pain, SOB or pain. Some mild lightheadedness post walk, resolved with rest. Nurse aware. Education given to pt on heart healthy diet, radial/femoral weight restrictions, MI booklet, adherence to NTG, Brilinta and ASA.  Home exercise guidelines given and will refer to cardiac rehab phase 2 at [facility]. Pt left in the bed with call bell in reach. All questions were answered and pt verbalized understanding.  Harrie Jeans ACSM-CEP 02/18/2024 10:52 AM

## 2024-02-18 NOTE — Telephone Encounter (Signed)
   Transition of Care Follow-up Phone Call Request    Patient Name: EYDAN CHIANESE Date of Birth: 07-Jan-1951 Date of Encounter: 02/18/2024  Primary Care Provider:  Juliette Alcide, MD Primary Cardiologist:  Kristeen Miss, MD  Prudencio Burly has been scheduled for a transition of care follow up appointment with a [facility] provider:  Tereso Newcomer, PA-C, 02/28/2024 at 8:25am.  Please reach out to Prudencio Burly within 48 hours of discharge to confirm appointment and review transition of care protocol questionnaire. Anticipated discharge date: 02/18/2024  Corrin Parker, Cordelia Poche  02/18/2024, 10:37 PM

## 2024-02-18 NOTE — Plan of Care (Signed)
 Problem: Education: Goal: Ability to describe self-care measures that may prevent or decrease complications (Diabetes Survival Skills Education) will improve Outcome: Adequate for Discharge Goal: Individualized Educational Video(s) Outcome: Adequate for Discharge   Problem: Coping: Goal: Ability to adjust to condition or change in health will improve Outcome: Adequate for Discharge   Problem: Fluid Volume: Goal: Ability to maintain a balanced intake and output will improve Outcome: Adequate for Discharge   Problem: Health Behavior/Discharge Planning: Goal: Ability to identify and utilize available resources and services will improve Outcome: Adequate for Discharge Goal: Ability to manage health-related needs will improve Outcome: Adequate for Discharge   Problem: Metabolic: Goal: Ability to maintain appropriate glucose levels will improve Outcome: Adequate for Discharge   Problem: Nutritional: Goal: Maintenance of adequate nutrition will improve Outcome: Adequate for Discharge Goal: Progress toward achieving an optimal weight will improve Outcome: Adequate for Discharge   Problem: Skin Integrity: Goal: Risk for impaired skin integrity will decrease Outcome: Adequate for Discharge   Problem: Tissue Perfusion: Goal: Adequacy of tissue perfusion will improve Outcome: Adequate for Discharge   Problem: Education: Goal: Understanding of cardiac disease, CV risk reduction, and recovery process will improve Outcome: Adequate for Discharge Goal: Individualized Educational Video(s) Outcome: Adequate for Discharge   Problem: Activity: Goal: Ability to tolerate increased activity will improve Outcome: Adequate for Discharge   Problem: Cardiac: Goal: Ability to achieve and maintain adequate cardiovascular perfusion will improve Outcome: Adequate for Discharge   Problem: Health Behavior/Discharge Planning: Goal: Ability to safely manage health-related needs after discharge  will improve Outcome: Adequate for Discharge   Problem: Education: Goal: Knowledge of General Education information will improve Description: Including pain rating scale, medication(s)/side effects and non-pharmacologic comfort measures Outcome: Adequate for Discharge   Problem: Health Behavior/Discharge Planning: Goal: Ability to manage health-related needs will improve Outcome: Adequate for Discharge   Problem: Clinical Measurements: Goal: Ability to maintain clinical measurements within normal limits will improve Outcome: Adequate for Discharge Goal: Will remain free from infection Outcome: Adequate for Discharge Goal: Diagnostic test results will improve Outcome: Adequate for Discharge Goal: Respiratory complications will improve Outcome: Adequate for Discharge Goal: Cardiovascular complication will be avoided Outcome: Adequate for Discharge   Problem: Activity: Goal: Risk for activity intolerance will decrease Outcome: Adequate for Discharge   Problem: Nutrition: Goal: Adequate nutrition will be maintained Outcome: Adequate for Discharge   Problem: Coping: Goal: Level of anxiety will decrease Outcome: Adequate for Discharge   Problem: Elimination: Goal: Will not experience complications related to bowel motility Outcome: Adequate for Discharge Goal: Will not experience complications related to urinary retention Outcome: Adequate for Discharge   Problem: Pain Managment: Goal: General experience of comfort will improve and/or be controlled Outcome: Adequate for Discharge   Problem: Safety: Goal: Ability to remain free from injury will improve Outcome: Adequate for Discharge   Problem: Skin Integrity: Goal: Risk for impaired skin integrity will decrease Outcome: Adequate for Discharge   Problem: Education: Goal: Understanding of CV disease, CV risk reduction, and recovery process will improve Outcome: Adequate for Discharge Goal: Individualized Educational  Video(s) Outcome: Adequate for Discharge   Problem: Activity: Goal: Ability to return to baseline activity level will improve Outcome: Adequate for Discharge   Problem: Cardiovascular: Goal: Ability to achieve and maintain adequate cardiovascular perfusion will improve Outcome: Adequate for Discharge Goal: Vascular access site(s) Level 0-1 will be maintained Outcome: Adequate for Discharge   Problem: Health Behavior/Discharge Planning: Goal: Ability to safely manage health-related needs after discharge will improve Outcome: Adequate for  Discharge

## 2024-02-18 NOTE — Progress Notes (Signed)
   Patient Name: Kyle Lambert Date of Encounter: 02/18/2024 [facility] Cardiologist: Kristeen Miss, MD   Interval Summary  .    Feels great.  Stayed in sinus rhythm overnight.  No ventricular arrhythmia.  Has not walked yet since the procedure. Echo shows normal LV function. Hemoglobin 9.5 with microcytic indices.  Vital Signs .    Vitals:   02/17/24 1957 02/17/24 2329 02/18/24 0515 02/18/24 0714  BP: (!) 94/58 (!) 97/49 109/69 122/81  Pulse: 61 65 61 71  Resp: 20 18 20 20   Temp: 97.8 F (36.6 C) 98.1 F (36.7 C) 97.9 F (36.6 C) 97.8 F (36.6 C)  TempSrc: Oral Oral Oral Oral  SpO2: 95% 94% 94% 98%  Weight:      Height:        Intake/Output Summary (Last 24 hours) at 02/18/2024 0815 Last data filed at 02/18/2024 0555 Gross per 24 hour  Intake 491.67 ml  Output 100 ml  Net 391.67 ml      02/16/2024    8:55 PM 02/16/2024   12:25 PM 10/04/2023   11:46 AM  Last 3 Weights  Weight (lbs) 224 lb 12.8 oz 238 lb 221 lb 9.6 oz  Weight (kg) 101.969 kg 107.956 kg 100.517 kg      Telemetry/ECG    Normal sinus rhythm with occasional PACs- Personally Reviewed  ECG this morning shows normal sinus rhythm with occasional PACs  Echo shows normal LV function and no visible wall motion abnormalities.  Cardiac catheterization showed heavily calcified high-grade stenosis of 50 ostial LAD artery treated with shockwave/drug-eluting stent with good result.  Previously placed distal LAD stent and RCA stent are widely patent.  Moderate OM disease.  Normal LVEDP.  Physical Exam .   GEN: No acute distress.   Neck: No JVD Cardiac: RRR, no murmurs, rubs, or gallops.  Respiratory: Clear to auscultation bilaterally. GI: Soft, nontender, non-distended  MS: No edema  Assessment & Plan .     73 year old gentleman with diabetes mellitus and known CAD presents with unstable angina and found to have high-grade distal left main/ostial LAD disease treated with shockwave lithotripsy and  placement of a drug-eluting stent.  Also was in atrial fibrillation on admission but spontaneously converted to sinus rhythm. Has converted to normal rhythm.  No symptoms of heart failure and no ventricular rhythm abnormalities.  Denies any residual chest pain. CHA2DS2-VASc score 4 (age, CAD, HTN, diabetes mellitus). He is already on lipid-lowering therapy with an excellent LDL of 50, but has a chronically low HDL of 36.  Controlled diabetes mellitus with A1c 6.4%. Plan to discharge on triple antithrombotic therapy (aspirin, clopidogrel, Eliquis, stop aspirin after 30 days), so we will need to watch his hemoglobin carefully. Has not had any overt GI bleeding.  Reports a colonoscopy with normal findings about 3 years ago.  No history of peptic ulcer disease.  Has kidney stones but has not had hematuria.  Reviewed the importance of uninterrupted dual antiplatelet therapy with aspirin for 30 days and clopidogrel for 12 months. Reviewed diet and exercise.  Early follow-up within 14 days.    Have sent off iron studies. For questions or updates, please contact [facility] Please consult [email or web address] for contact info under        Signed, Thurmon Fair, MD

## 2024-02-19 LAB — GLUCOSE, CAPILLARY: Glucose-Capillary: 123 mg/dL — ABNORMAL HIGH (ref 70–99)

## 2024-02-19 MED FILL — Clopidogrel Bisulfate Tab 300 MG (Base Equiv): ORAL | Qty: 1 | Status: AC

## 2024-02-20 ENCOUNTER — Encounter (HOSPITAL_COMMUNITY): Payer: Self-pay | Admitting: Cardiovascular Disease

## 2024-02-21 ENCOUNTER — Encounter: Payer: Self-pay | Admitting: Cardiovascular Disease

## 2024-02-21 ENCOUNTER — Ambulatory Visit: Attending: Cardiovascular Disease | Admitting: Cardiovascular Disease

## 2024-02-21 VITALS — BP 134/78 | HR 81 | Ht 69.0 in | Wt 228.8 lb

## 2024-02-21 DIAGNOSIS — I2510 Atherosclerotic heart disease of native coronary artery without angina pectoris: Secondary | ICD-10-CM | POA: Diagnosis not present

## 2024-02-21 DIAGNOSIS — R079 Chest pain, unspecified: Secondary | ICD-10-CM

## 2024-02-21 NOTE — Progress Notes (Signed)
 Cardiology Office Note:    Date:  02/21/2024   ID:  Kyle Lambert, Kyle Lambert 1951/06/25, MRN [id number]  PCP:  Juliette Alcide, MD   [facility] Providers Cardiologist:  Kristeen Miss, MD     Referring MD: Juliette Alcide, MD   Chief Complaint  Patient presents with   Coronary Artery Disease         History of Present Illness:     Seen with wife , Kyle Lambert    Kyle Lambert is a 72 y.o. male with a hx of CAD Is a former patient of Dr. Earmon Phoenix.  Was last seen in 2020 He has a history of coronary artery disease with DES to the RCA in 2013.  He also has a DES to the mid LAD and balloon angioplasty to the first diagonal.  Heart catheterization in March, 2020 revealed patent stents in the LAD and RCA with moderate residual restenosis in the small to medium sized first diagonal vessel.  His primary MD recommended that he be seen again  Is some left sided chest pain These are not similar to his angina  Does not having to take SL NTG  Sharp pain ,  lasts for 10-12 seconds  Not with activity Started hurting yesterday while driving his car  Does not get much exercise Walks around in the yard, perhaps 1/2 mile each day   Used to walk 2 miles a day , now he gets out of breath when walking  Avoids salt for the most part  Was having hematura last month He stopped the plavix and continued ASA  I'll have him restart the plavix Hold ASA  Will get an echo to evaluate his DOE  [name] to evaluate his left chest pain    Oct. 21, 2024 No show, patient cancelled  Nov. 12, 2024  Kyle Lambert is seen for follow up of his CAD  Former patient of Excell Seltzer, has had several PCIs Reported some chest pain when I saw him several months ago Echo reveals normal LV function with EF 60-65% Trivial MR  Myoview study was normal .  No ischemia,   Is having some left sided chest soreness.  Has been present for 2 1/2 weeks Seems to be a muscle ache Not worsened with exercise Does not  feel like his previous heart pain   Lexiscan myoview in July 2024 was normal , low risk    Exercises several times a week  - will walk 1/2 mile several times a day .  Hx of HTN   February 21, 2024 Kyle Lambert is seen today for continued episodes of chest pain.  He was recently admitted to the hospital for chest pain.  Heart catheterization on March 28 revealed a tight distal left main/LAD stenosis.  This lesion was successfully stented.  I have reviewed the cath films and Dr. Clifton James was able to get a very nice result with the stent placement   He has had recurrent CP following his coronary intervention  Feels like his angina pain  Chest pressure  Not quite a severe as when he presented with his NSTEMI    Had an episode when he woke up He had CP again when he was walking out in the back yard  Lasts for 20-25 min He did not take a NTG   ECG today is non acute  He's on Imdur 120 mg a day  Metoprolol 25 mg BID  ASA 81 mg every day Plavix 75 mg  a day  Eliquis 5 mg PO BID   Wife says he has been managing his meds,  she wants to assume responsibility for his meds        Past Medical History:  Diagnosis Date   Angina pectoris (HCC)    Arthritis    Bladder cancer (HCC) 03/2020   Bladder carcinoma (HCC)    BMI 35.0-35.9,adult    CAD (coronary artery disease)    a. 03/2012 Cath: LAD 80, RCA 80, nl EF;  b. PCI of RCA w 3.0x6mm Promus DES, FFR of LAD was nl @ 0.85 ->Med Rx.;  c. LHC 02/15/13: proximal LAD 40-50%, mid LAD 95%, small D1 95-99%, mid CFX 30-40%, proximal RCA stent patent with ostial 20-30% prior to the stent, ostial PDA 50%, EF 55-65%. PCI:  Promus DES to the mid LAD and POBA to the proximal D1    Chest pain    COVID    DM (diabetes mellitus) (HCC)    Elevated TSH    Headache    History of angina    Hypercholesterolemia    Hyperlipidemia    Hypertension    Hypotension    Kidney stone    Knee pain    Low back pain    Morbid obesity (HCC)    OA (osteoarthritis)     Obesity    OSA (obstructive sleep apnea)    Sepsis (HCC)    Skin lesion    Tension headache    Tinnitus, bilateral    Umbilical hernia    UTI (urinary tract infection)     Past Surgical History:  Procedure Laterality Date   CORONARY ANGIOPLASTY WITH STENT PLACEMENT  04/24/12   DES-RCA   CORONARY ANGIOPLASTY WITH STENT PLACEMENT  02/15/13   Patent RCA stent, diffuse nonobstructive R PDA, LCx disease, 99% mid LAD stenosis s/p DES-mid LAD; LVEF 60%   CORONARY LITHOTRIPSY N/A 02/17/2024   Procedure: CORONARY LITHOTRIPSY;  Surgeon: Kathleene Hazel, MD;  Location: [facility];  Service: Cardiovascular;  Laterality: N/A;   CORONARY STENT INTERVENTION N/A 02/17/2024   Procedure: CORONARY STENT INTERVENTION;  Surgeon: Kathleene Hazel, MD;  Location: [facility];  Service: Cardiovascular;  Laterality: N/A;   CORONARY STENT PLACEMENT     CYSTOSCOPY W/ RETROGRADES  04/07/2020   Procedure: CYSTOSCOPY WITH LEFT RETROGRADE PYELOGRAM;  Surgeon: Malen Gauze, MD;  Location: [facility];  Service: Urology;;   CYSTOSCOPY WITH BIOPSY  04/07/2020   Procedure: CYSTOSCOPY WITH BLADDER BIOPSY;  Surgeon: Malen Gauze, MD;  Location: [facility];  Service: Urology;;   Bluford Kaufmann WITH FULGERATION  04/07/2020   Procedure: Bluford Kaufmann WITH FULGERATION;  Surgeon: Malen Gauze, MD;  Location: [facility];  Service: Urology;;   CYSTOSCOPY/URETEROSCOPY/HOLMIUM LASER/STENT PLACEMENT Left 04/07/2020   Procedure: CYSTOSCOPY/LEFT URETEROSCOPY/HOLMIUM LASER LITHOTRIPSY/LEFT URETERAL STENT PLACEMENT;  Surgeon: Malen Gauze, MD;  Location: [facility];  Service: Urology;  Laterality: Left;   HERNIA REPAIR Left inguinal   LEFT HEART CATH AND CORONARY ANGIOGRAPHY N/A 01/31/2019   Procedure: LEFT HEART CATH AND CORONARY ANGIOGRAPHY;  Surgeon: Tonny Bollman, MD;  Location: [facility] INVASIVE CV LAB;  Service: Cardiovascular;  Laterality: N/A;   LEFT HEART CATH AND CORONARY ANGIOGRAPHY N/A 02/17/2024    Procedure: LEFT HEART CATH AND CORONARY ANGIOGRAPHY;  Surgeon: Kathleene Hazel, MD;  Location: [facility];  Service: Cardiovascular;  Laterality: N/A;   LEFT HEART CATHETERIZATION WITH CORONARY ANGIOGRAM N/A 04/21/2012   Procedure: LEFT HEART CATHETERIZATION WITH CORONARY ANGIOGRAM;  Surgeon: Herby Abraham, MD;  Location: [facility] CATH LAB;  Service: Cardiovascular;  Laterality: N/A;   LEFT HEART CATHETERIZATION WITH CORONARY ANGIOGRAM N/A 02/15/2013   Procedure: LEFT HEART CATHETERIZATION WITH CORONARY ANGIOGRAM;  Surgeon: Tonny Bollman, MD;  Location: [facility] CATH LAB;  Service: Cardiovascular;  Laterality: N/A;   LITHOTRIPSY     PERCUTANEOUS CORONARY STENT INTERVENTION (PCI-S) N/A 04/24/2012   Procedure: PERCUTANEOUS CORONARY STENT INTERVENTION (PCI-S);  Surgeon: Tonny Bollman, MD;  Location: [facility] CATH LAB;  Service: Cardiovascular;  Laterality: N/A;   PERCUTANEOUS CORONARY STENT INTERVENTION (PCI-S)  02/15/2013   Procedure: PERCUTANEOUS CORONARY STENT INTERVENTION (PCI-S);  Surgeon: Tonny Bollman, MD;  Location: [facility] CATH LAB;  Service: Cardiovascular;;    Current Medications: Current Meds  Medication Sig   amLODipine (NORVASC) 5 MG tablet TAKE 1 TABLET DAILY   apixaban (ELIQUIS) 5 MG TABS tablet Take 1 tablet (5 mg total) by mouth 2 (two) times daily.   aspirin EC 81 MG tablet Take 81 mg by mouth daily.   atorvastatin (LIPITOR) 40 MG tablet Take 40 mg by mouth at bedtime.    clopidogrel (PLAVIX) 75 MG tablet Take 1 tablet (75 mg total) by mouth daily.   cyclobenzaprine (FLEXERIL) 10 MG tablet Take 10 mg by mouth daily as needed for muscle spasms.   Ferrous Sulfate (IRON) 325 (65 Fe) MG TABS Take 1 tablet (325 mg total) by mouth 2 (two) times daily.   isosorbide mononitrate (IMDUR) 120 MG 24 hr tablet Take 1 tablet (120 mg total) by mouth daily.   metFORMIN (GLUCOPHAGE) 1000 MG tablet Take 1,000 mg by mouth 2 (two) times daily.   metoprolol tartrate (LOPRESSOR) 25 MG tablet TAKE 1  TABLET TWICE A DAY (Patient taking differently: Take 25 mg by mouth daily.)   Multiple Vitamins-Minerals (PRESERVISION AREDS 2 PO) Take 1 tablet by mouth in the morning and at bedtime.   nitroGLYCERIN (NITROSTAT) 0.4 MG SL tablet Place 1 tablet (0.4 mg total) under the tongue every 5 (five) minutes x 3 doses as needed for chest pain.   pantoprazole (PROTONIX) 40 MG tablet TAKE 1 TABLET BY MOUTH ONCE DAILY --  NEEDS  APPOINTMENT  FOR  FURTHER  REFILLS (Patient taking differently: Take 40 mg by mouth daily.)   tamsulosin (FLOMAX) 0.4 MG CAPS capsule Take 1 capsule (0.4 mg total) by mouth 2 (two) times daily.     Allergies:   Patient has no known allergies.   Social History   Socioeconomic History   Marital status: Married    Spouse name: Kyle Lambert   Number of children: 1   Years of education: Not on file   Highest education level: Not on file  Occupational History   Not on file  Tobacco Use   Smoking status: Never   Smokeless tobacco: Never  Vaping Use   Vaping status: Never Used  Substance and Sexual Activity   Alcohol use: No    Comment: " quit along time ago"   Drug use: No   Sexual activity: Not Currently  Other Topics Concern   Not on file  Social History Narrative   Lives with wife, dgtr, 2 grandsons live with   Social Drivers of Health   Financial Resource Strain: Not on file  Food Insecurity: No Food Insecurity (02/16/2024)   Hunger Vital Sign    Worried About Running Out of Food in the Last Year: Never true    Ran Out of Food in the Last Year: Never true  Transportation Needs: No Transportation  Needs (02/16/2024)   PRAPARE - Administrator, Civil Service (Medical): No    Lack of Transportation (Non-Medical): No  Physical Activity: Not on file  Stress: Not on file  Social Connections: Socially Integrated (02/16/2024)   Social Connection and Isolation Panel [NHANES]    Frequency of Communication with Friends and Family: More than three times a week     Frequency of Social Gatherings with Friends and Family: Never    Attends Religious Services: More than 4 times per year    Active Member of [employer] or Organizations: No    Attends Engineer, structural: 1 to 4 times per year    Marital Status: Married     Family History: The patient's family history includes Cancer in his mother; Lung cancer in his father; Rheum arthritis in his mother; Stroke in his brother.  ROS:   Please see the history of present illness.     All other systems reviewed and are negative.  EKGs/Labs/Other Studies Reviewed:    The following studies were reviewed today:  EKG Interpretation Date/Time:  Tuesday February 21 2024 15:54:54 EDT Ventricular Rate:  80 PR Interval:  192 QRS Duration:  90 QT Interval:  402 QTC Calculation: 463 R Axis:   43  Text Interpretation: Normal sinus rhythm Normal ECG When compared with ECG of 18-Feb-2024 08:37, T wave inversion no longer evident in Anterior leads Confirmed by Kristeen Miss [id number]) on 02/21/2024 3:59:39 PM    Recent Labs: 02/16/2024: ALT 19; Magnesium 1.5 02/17/2024: TSH 3.041 02/18/2024: BUN 12; Creatinine, Ser 0.80; Hemoglobin 9.5; Platelets 241; Potassium 3.7; Sodium 136  Recent Lipid Panel    Component Value Date/Time   CHOL 98 02/17/2024 0405   TRIG 61 02/17/2024 0405   HDL 36 (L) 02/17/2024 0405   CHOLHDL 2.7 02/17/2024 0405   VLDL 12 02/17/2024 0405   LDLCALC 50 02/17/2024 0405     Risk Assessment/Calculations:       Physical Exam:     Physical Exam: Blood pressure 134/78, pulse 81, height 5\' 9"  (1.753 m), weight 228 lb 12.8 oz (103.8 kg), SpO2 98%.       GEN:  Well nourished, well developed in no acute distress HEENT: Normal NECK: No JVD; No carotid bruits LYMPHATICS: No lymphadenopathy CARDIAC: RRR , no murmurs, rubs, gallops RESPIRATORY:  Clear to auscultation without rales, wheezing or rhonchi  ABDOMEN: Soft, non-tender, non-distended MUSCULOSKELETAL:  right radial cath site  looks good.  Pulses are good   SKIN: Warm and dry NEUROLOGIC:  Alert and oriented x 3    ASSESSMENT:    1. Coronary artery disease involving native coronary artery of native heart without angina pectoris   2. Chest pain, unspecified type      PLAN:       CAD:       Kyle Lambert is having recurrent CP 5 days out from stenting of his proximal LAD.  The pain is very similar to his episodes of chest pain although is not as bad.  The pains lasted from 20 to 25 minutes.  He has not taken any nitroglycerin yet but I encouraged him to go ahead and take a nitroglycerin for these recurrent episodes of chest pain. Will schedule him for relook heart catheterization tomorrow with Dr. Clifton James.  We discussed the risk, benefits, options of heart catheterization.  He understands and agrees to proceed.   3.  Hyperlipidemia:   stable     Medication Adjustments/Labs and Tests Ordered: Current medicines are  reviewed at length with the patient today.  Concerns regarding medicines are outlined above.  Orders Placed This Encounter  Procedures   EKG 12-Lead   No orders of the defined types were placed in this encounter.   There are no Patient Instructions on file for this visit.   Signed, Kristeen Miss, MD  02/21/2024 4:00 PM    [facility]

## 2024-02-21 NOTE — Telephone Encounter (Signed)
 Was able to make contact with the pt.   Pt states starting late last night, he started experiencing left sided chest pain with mild sob.  He states this lingered into early morning but now symptoms are gone.   Pt states he had no other symptoms like N/V, diaphoresis, orthopnea, swelling, pre-syncopal/syncopal episodes.   He states he is currently at the barber shop right now and chest discomfort is gone.  Pt states he is really anxious about the chest pain he experienced last night, given recent hospitalization for NSTEMI.  On 3/27 pt underwent successful PCI with PTCA/ shockwave IC lithotripsy/ DES to ostial to proximal LAD.   He was discharged on 3/29 and today we called him for [facility] follow-up.  Pt is currently scheduled for TOC appt with Tereso Newcomer PA-C on 4/8, but wants to be seen sooner for CP episodes he experienced last night and earlier this morning.  Was able to schedule the pt an appt to see his General Cardiologist Dr. Elease Hashimoto for today 4/1 at 3:40 pm.  Advised the pt to arrive 10-15 mins prior to this visit.   Pt verbalized understanding and agrees with this plan.

## 2024-02-21 NOTE — Patient Instructions (Signed)
 Testing/Procedures: Left heart catheterization Your physician has requested that you have a cardiac catheterization. Cardiac catheterization is used to diagnose and/or treat various heart conditions. Doctors may recommend this procedure for a number of different reasons. The most common reason is to evaluate chest pain. Chest pain can be a symptom of coronary artery disease (CAD), and cardiac catheterization can show whether plaque is narrowing or blocking your heart's arteries. This procedure is also used to evaluate the valves, as well as measure the blood flow and oxygen levels in different parts of your heart. For further information please visit [email or web address] Please follow instruction sheet, as given.  Follow-Up: At [facility], you and your health needs are our priority.  As part of our continuing mission to provide you with exceptional heart care, our providers are all part of one team.  This team includes your primary Cardiologist (physician) and Advanced Practice Providers or APPs (Physician Assistants and Nurse Practitioners) who all work together to provide you with the care you need, when you need it.  Your next appointment:   3 week(s)  Provider:   Kristeen Miss, MD  or APP    Other Instructions       Cardiac/Peripheral Catheterization   You are scheduled for a Cardiac Catheterization on Wednesday, April 2 with Dr. Verne Carrow.  1. Please arrive at the [facility] (Main Entrance A) at [facility]: [address] [place], [place] [address] at 11:00 AM (This time is two hour(s) before your procedure to ensure your preparation).   Free valet parking service is available. You will check in at ADMITTING. The support person will be asked to wait in the waiting room.  It is OK to have someone drop you off and come back when you are ready to be discharged.        Special note: Every effort is made to have your procedure done on time. Please  understand that emergencies sometimes delay scheduled procedures.  2. Diet: Do not eat solid foods after midnight.  You may have clear liquids until 5 AM the day of the procedure.  3. Labs: NONE  4. Medication instructions in preparation for your procedure:   Contrast Allergy: No  DO NOT TAKE Eliquis tonight and continue to hold for 24 hours (resume Thursday) DO NOT TAKE Metformin day of procedure and hold for 48 hours (resume Saturday) On the morning of your procedure, take Aspirin 81 mg and Plavix/Clopidogrel and any morning medicines NOT listed above.  You may use sips of water.  5. Plan to go home the same day, you will only stay overnight if medically necessary. 6. You MUST have a responsible adult to drive you home. 7. An adult MUST be with you the first 24 hours after you arrive home. 8. Bring a current list of your medications, and the last time and date medication taken. 9. Bring ID and current insurance cards. 10.Please wear clothes that are easy to get on and off and wear slip-on shoes.  Thank you for allowing [place] to care for you!   -- [facility]    1st Floor: - Lobby - Registration  - Pharmacy  - Lab - Cafe  2nd Floor: - PV Lab - Diagnostic Testing (echo, CT, nuclear med)  3rd Floor: - Vacant  4th Floor: - TCTS (cardiothoracic surgery) - AFib Clinic - Structural Heart Clinic - Vascular Surgery  - Vascular Ultrasound  5th Floor: - HeartCare Cardiology (general and EP) -  Clinical Pharmacy for coumadin, hypertension, lipid, weight-loss medications, and med management appointments    Valet parking services will be available as well.

## 2024-02-21 NOTE — Telephone Encounter (Signed)
 Patient returned call. Appointment has been concerned.

## 2024-02-21 NOTE — Telephone Encounter (Signed)
 Transition Care Management Unsuccessful Follow-up Telephone Call  Date of discharge and from where:  02/18/24 [facility]   Attempts:  1st Attempt  Reason for unsuccessful TCM follow-up call:  Left voice message

## 2024-02-21 NOTE — Telephone Encounter (Addendum)
 Triage was calling the pt to do a TCM follow-up call from pts recent hospitalization on 3/29.   Was able to make contact with the pt for a short time before the call was dropped.   When speaking with the pt before the call was dropped, he mentioned he wasn't doing well at all, and started having chest pain last night this morning.    While trying to obtain more information about his chest pain episodes, the call then air dropped, from what  seems to be bad connection.    Tried calling him back x 2 and no answer.

## 2024-02-21 NOTE — H&P (View-Only) (Signed)
 Cardiology Office Note:    Date:  02/21/2024   ID:  Kyle Lambert, Kyle Lambert 1951/06/25, MRN [id number]  PCP:  Juliette Alcide, MD   [facility] Providers Cardiologist:  Kristeen Miss, MD     Referring MD: Juliette Alcide, MD   Chief Complaint  Patient presents with   Coronary Artery Disease         History of Present Illness:     Seen with wife , Kyle Lambert    Kyle Lambert is a 72 y.o. male with a hx of CAD Is a former patient of Dr. Earmon Phoenix.  Was last seen in 2020 He has a history of coronary artery disease with DES to the RCA in 2013.  He also has a DES to the mid LAD and balloon angioplasty to the first diagonal.  Heart catheterization in March, 2020 revealed patent stents in the LAD and RCA with moderate residual restenosis in the small to medium sized first diagonal vessel.  His primary MD recommended that he be seen again  Is some left sided chest pain These are not similar to his angina  Does not having to take SL NTG  Sharp pain ,  lasts for 10-12 seconds  Not with activity Started hurting yesterday while driving his car  Does not get much exercise Walks around in the yard, perhaps 1/2 mile each day   Used to walk 2 miles a day , now he gets out of breath when walking  Avoids salt for the most part  Was having hematura last month He stopped the plavix and continued ASA  I'll have him restart the plavix Hold ASA  Will get an echo to evaluate his DOE  [name] to evaluate his left chest pain    Oct. 21, 2024 No show, patient cancelled  Nov. 12, 2024  Kyle Lambert is seen for follow up of his CAD  Former patient of Excell Seltzer, has had several PCIs Reported some chest pain when I saw him several months ago Echo reveals normal LV function with EF 60-65% Trivial MR  Myoview study was normal .  No ischemia,   Is having some left sided chest soreness.  Has been present for 2 1/2 weeks Seems to be a muscle ache Not worsened with exercise Does not  feel like his previous heart pain   Lexiscan myoview in July 2024 was normal , low risk    Exercises several times a week  - will walk 1/2 mile several times a day .  Hx of HTN   February 21, 2024 Kyle Lambert is seen today for continued episodes of chest pain.  He was recently admitted to the hospital for chest pain.  Heart catheterization on March 28 revealed a tight distal left main/LAD stenosis.  This lesion was successfully stented.  I have reviewed the cath films and Kyle Lambert was able to get a very nice result with the stent placement   He has had recurrent CP following his coronary intervention  Feels like his angina pain  Chest pressure  Not quite a severe as when he presented with his NSTEMI    Had an episode when he woke up He had CP again when he was walking out in the back yard  Lasts for 20-25 min He did not take a NTG   ECG today is non acute  He's on Imdur 120 mg a day  Metoprolol 25 mg BID  ASA 81 mg every day Plavix 75 mg  a day  Eliquis 5 mg PO BID   Wife says he has been managing his meds,  she wants to assume responsibility for his meds        Past Medical History:  Diagnosis Date   Angina pectoris (HCC)    Arthritis    Bladder cancer (HCC) 03/2020   Bladder carcinoma (HCC)    BMI 35.0-35.9,adult    CAD (coronary artery disease)    a. 03/2012 Cath: LAD 80, RCA 80, nl EF;  b. PCI of RCA w 3.0x6mm Promus DES, FFR of LAD was nl @ 0.85 ->Med Rx.;  c. LHC 02/15/13: proximal LAD 40-50%, mid LAD 95%, small D1 95-99%, mid CFX 30-40%, proximal RCA stent patent with ostial 20-30% prior to the stent, ostial PDA 50%, EF 55-65%. PCI:  Promus DES to the mid LAD and POBA to the proximal D1    Chest pain    COVID    DM (diabetes mellitus) (HCC)    Elevated TSH    Headache    History of angina    Hypercholesterolemia    Hyperlipidemia    Hypertension    Hypotension    Kidney stone    Knee pain    Low back pain    Morbid obesity (HCC)    OA (osteoarthritis)     Obesity    OSA (obstructive sleep apnea)    Sepsis (HCC)    Skin lesion    Tension headache    Tinnitus, bilateral    Umbilical hernia    UTI (urinary tract infection)     Past Surgical History:  Procedure Laterality Date   CORONARY ANGIOPLASTY WITH STENT PLACEMENT  04/24/12   DES-RCA   CORONARY ANGIOPLASTY WITH STENT PLACEMENT  02/15/13   Patent RCA stent, diffuse nonobstructive R PDA, LCx disease, 99% mid LAD stenosis s/p DES-mid LAD; LVEF 60%   CORONARY LITHOTRIPSY N/A 02/17/2024   Procedure: CORONARY LITHOTRIPSY;  Surgeon: Kathleene Hazel, MD;  Location: [facility] INVASIVE CV LAB;  Service: Cardiovascular;  Laterality: N/A;   CORONARY STENT INTERVENTION N/A 02/17/2024   Procedure: CORONARY STENT INTERVENTION;  Surgeon: Kathleene Hazel, MD;  Location: [facility] INVASIVE CV LAB;  Service: Cardiovascular;  Laterality: N/A;   CORONARY STENT PLACEMENT     CYSTOSCOPY W/ RETROGRADES  04/07/2020   Procedure: CYSTOSCOPY WITH LEFT RETROGRADE PYELOGRAM;  Surgeon: Malen Gauze, MD;  Location: [facility] ORS;  Service: Urology;;   CYSTOSCOPY WITH BIOPSY  04/07/2020   Procedure: CYSTOSCOPY WITH BLADDER BIOPSY;  Surgeon: Malen Gauze, MD;  Location: [facility];  Service: Urology;;   Bluford Kaufmann WITH FULGERATION  04/07/2020   Procedure: Bluford Kaufmann WITH FULGERATION;  Surgeon: Malen Gauze, MD;  Location: [facility];  Service: Urology;;   CYSTOSCOPY/URETEROSCOPY/HOLMIUM LASER/STENT PLACEMENT Left 04/07/2020   Procedure: CYSTOSCOPY/LEFT URETEROSCOPY/HOLMIUM LASER LITHOTRIPSY/LEFT URETERAL STENT PLACEMENT;  Surgeon: Malen Gauze, MD;  Location: [facility];  Service: Urology;  Laterality: Left;   HERNIA REPAIR Left inguinal   LEFT HEART CATH AND CORONARY ANGIOGRAPHY N/A 01/31/2019   Procedure: LEFT HEART CATH AND CORONARY ANGIOGRAPHY;  Surgeon: Tonny Bollman, MD;  Location: [facility] INVASIVE CV LAB;  Service: Cardiovascular;  Laterality: N/A;   LEFT HEART CATH AND CORONARY ANGIOGRAPHY N/A 02/17/2024    Procedure: LEFT HEART CATH AND CORONARY ANGIOGRAPHY;  Surgeon: Kathleene Hazel, MD;  Location: [facility] INVASIVE CV LAB;  Service: Cardiovascular;  Laterality: N/A;   LEFT HEART CATHETERIZATION WITH CORONARY ANGIOGRAM N/A 04/21/2012   Procedure: LEFT HEART CATHETERIZATION WITH CORONARY ANGIOGRAM;  Surgeon: Herby Abraham, MD;  Location: [facility] CATH LAB;  Service: Cardiovascular;  Laterality: N/A;   LEFT HEART CATHETERIZATION WITH CORONARY ANGIOGRAM N/A 02/15/2013   Procedure: LEFT HEART CATHETERIZATION WITH CORONARY ANGIOGRAM;  Surgeon: Tonny Bollman, MD;  Location: [facility] CATH LAB;  Service: Cardiovascular;  Laterality: N/A;   LITHOTRIPSY     PERCUTANEOUS CORONARY STENT INTERVENTION (PCI-S) N/A 04/24/2012   Procedure: PERCUTANEOUS CORONARY STENT INTERVENTION (PCI-S);  Surgeon: Tonny Bollman, MD;  Location: [facility] CATH LAB;  Service: Cardiovascular;  Laterality: N/A;   PERCUTANEOUS CORONARY STENT INTERVENTION (PCI-S)  02/15/2013   Procedure: PERCUTANEOUS CORONARY STENT INTERVENTION (PCI-S);  Surgeon: Tonny Bollman, MD;  Location: [facility] CATH LAB;  Service: Cardiovascular;;    Current Medications: Current Meds  Medication Sig   amLODipine (NORVASC) 5 MG tablet TAKE 1 TABLET DAILY   apixaban (ELIQUIS) 5 MG TABS tablet Take 1 tablet (5 mg total) by mouth 2 (two) times daily.   aspirin EC 81 MG tablet Take 81 mg by mouth daily.   atorvastatin (LIPITOR) 40 MG tablet Take 40 mg by mouth at bedtime.    clopidogrel (PLAVIX) 75 MG tablet Take 1 tablet (75 mg total) by mouth daily.   cyclobenzaprine (FLEXERIL) 10 MG tablet Take 10 mg by mouth daily as needed for muscle spasms.   Ferrous Sulfate (IRON) 325 (65 Fe) MG TABS Take 1 tablet (325 mg total) by mouth 2 (two) times daily.   isosorbide mononitrate (IMDUR) 120 MG 24 hr tablet Take 1 tablet (120 mg total) by mouth daily.   metFORMIN (GLUCOPHAGE) 1000 MG tablet Take 1,000 mg by mouth 2 (two) times daily.   metoprolol tartrate (LOPRESSOR) 25 MG tablet TAKE 1  TABLET TWICE A DAY (Patient taking differently: Take 25 mg by mouth daily.)   Multiple Vitamins-Minerals (PRESERVISION AREDS 2 PO) Take 1 tablet by mouth in the morning and at bedtime.   nitroGLYCERIN (NITROSTAT) 0.4 MG SL tablet Place 1 tablet (0.4 mg total) under the tongue every 5 (five) minutes x 3 doses as needed for chest pain.   pantoprazole (PROTONIX) 40 MG tablet TAKE 1 TABLET BY MOUTH ONCE DAILY --  NEEDS  APPOINTMENT  FOR  FURTHER  REFILLS (Patient taking differently: Take 40 mg by mouth daily.)   tamsulosin (FLOMAX) 0.4 MG CAPS capsule Take 1 capsule (0.4 mg total) by mouth 2 (two) times daily.     Allergies:   Patient has no known allergies.   Social History   Socioeconomic History   Marital status: Married    Spouse name: Kyle Lambert   Number of children: 1   Years of education: Not on file   Highest education level: Not on file  Occupational History   Not on file  Tobacco Use   Smoking status: Never   Smokeless tobacco: Never  Vaping Use   Vaping status: Never Used  Substance and Sexual Activity   Alcohol use: No    Comment: " quit along time ago"   Drug use: No   Sexual activity: Not Currently  Other Topics Concern   Not on file  Social History Narrative   Lives with wife, dgtr, 2 grandsons live with   Social Drivers of Health   Financial Resource Strain: Not on file  Food Insecurity: No Food Insecurity (02/16/2024)   Hunger Vital Sign    Worried About Running Out of Food in the Last Year: Never true    Ran Out of Food in the Last Year: Never true  Transportation Needs: No Transportation  Needs (02/16/2024)   PRAPARE - Administrator, Civil Service (Medical): No    Lack of Transportation (Non-Medical): No  Physical Activity: Not on file  Stress: Not on file  Social Connections: Socially Integrated (02/16/2024)   Social Connection and Isolation Panel [NHANES]    Frequency of Communication with Friends and Family: More than three times a week     Frequency of Social Gatherings with Friends and Family: Never    Attends Religious Services: More than 4 times per year    Active Member of [employer] or Organizations: No    Attends Engineer, structural: 1 to 4 times per year    Marital Status: Married     Family History: The patient's family history includes Cancer in his mother; Lung cancer in his father; Rheum arthritis in his mother; Stroke in his brother.  ROS:   Please see the history of present illness.     All other systems reviewed and are negative.  EKGs/Labs/Other Studies Reviewed:    The following studies were reviewed today:  EKG Interpretation Date/Time:  Tuesday February 21 2024 15:54:54 EDT Ventricular Rate:  80 PR Interval:  192 QRS Duration:  90 QT Interval:  402 QTC Calculation: 463 R Axis:   43  Text Interpretation: Normal sinus rhythm Normal ECG When compared with ECG of 18-Feb-2024 08:37, T wave inversion no longer evident in Anterior leads Confirmed by Kristeen Miss [id number]) on 02/21/2024 3:59:39 PM    Recent Labs: 02/16/2024: ALT 19; Magnesium 1.5 02/17/2024: TSH 3.041 02/18/2024: BUN 12; Creatinine, Ser 0.80; Hemoglobin 9.5; Platelets 241; Potassium 3.7; Sodium 136  Recent Lipid Panel    Component Value Date/Time   CHOL 98 02/17/2024 0405   TRIG 61 02/17/2024 0405   HDL 36 (L) 02/17/2024 0405   CHOLHDL 2.7 02/17/2024 0405   VLDL 12 02/17/2024 0405   LDLCALC 50 02/17/2024 0405     Risk Assessment/Calculations:       Physical Exam:     Physical Exam: Blood pressure 134/78, pulse 81, height 5\' 9"  (1.753 m), weight 228 lb 12.8 oz (103.8 kg), SpO2 98%.       GEN:  Well nourished, well developed in no acute distress HEENT: Normal NECK: No JVD; No carotid bruits LYMPHATICS: No lymphadenopathy CARDIAC: RRR , no murmurs, rubs, gallops RESPIRATORY:  Clear to auscultation without rales, wheezing or rhonchi  ABDOMEN: Soft, non-tender, non-distended MUSCULOSKELETAL:  right radial cath site  looks good.  Pulses are good   SKIN: Warm and dry NEUROLOGIC:  Alert and oriented x 3    ASSESSMENT:    1. Coronary artery disease involving native coronary artery of native heart without angina pectoris   2. Chest pain, unspecified type      PLAN:       CAD:       Jamaree is having recurrent CP 5 days out from stenting of his proximal LAD.  The pain is very similar to his episodes of chest pain although is not as bad.  The pains lasted from 20 to 25 minutes.  He has not taken any nitroglycerin yet but I encouraged him to go ahead and take a nitroglycerin for these recurrent episodes of chest pain. Will schedule him for relook heart catheterization tomorrow with Kyle Lambert.  We discussed the risk, benefits, options of heart catheterization.  He understands and agrees to proceed.   3.  Hyperlipidemia:   stable     Medication Adjustments/Labs and Tests Ordered: Current medicines are  reviewed at length with the patient today.  Concerns regarding medicines are outlined above.  Orders Placed This Encounter  Procedures   EKG 12-Lead   No orders of the defined types were placed in this encounter.   There are no Patient Instructions on file for this visit.   Signed, Kristeen Miss, MD  02/21/2024 4:00 PM    [facility]

## 2024-02-22 ENCOUNTER — Telehealth: Payer: Self-pay | Admitting: *Deleted

## 2024-02-22 NOTE — Telephone Encounter (Signed)
 Cardiac Catheterization scheduled at [facility] for: Thursday February 23, 2024 7:30 AM Arrival time [facility] Main Entrance A at: 5:30 AM  Nothing to eat after midnight prior to procedure, clear liquids until 5 AM day of procedure.  Medication instructions: -Hold:  Eliquis-pt reports last dose 02/21/24 ~9 AM-knows to hold until post procedure  Metformin-day of procedure and 48 hours post procedure -Other usual morning medications can be taken with sips of water including aspirin 81 mg and Plavix 75 mg.  Plan to go home the same day, you will only stay overnight if medically necessary.  You must have responsible adult to drive you home.  Someone must be with you the first 24 hours after you arrive home.  Reviewed procedure instructions with patient's wife (DPR).

## 2024-02-23 ENCOUNTER — Other Ambulatory Visit: Payer: Self-pay

## 2024-02-23 ENCOUNTER — Encounter (HOSPITAL_COMMUNITY): Payer: Self-pay | Admitting: Cardiovascular Disease

## 2024-02-23 ENCOUNTER — Ambulatory Visit (HOSPITAL_COMMUNITY)
Admission: RE | Admit: 2024-02-23 | Discharge: 2024-02-23 | Disposition: A | Discharge status: Home / Self Care | Attending: Cardiovascular Disease | Admitting: Cardiovascular Disease

## 2024-02-23 ENCOUNTER — Ambulatory Visit (HOSPITAL_COMMUNITY)
Admission: RE | Disposition: A | Discharge status: Home / Self Care | Payer: Self-pay | Source: Home / Self Care | Attending: Cardiovascular Disease

## 2024-02-23 DIAGNOSIS — Z7984 Long term (current) use of oral hypoglycemic drugs: Secondary | ICD-10-CM | POA: Insufficient documentation

## 2024-02-23 DIAGNOSIS — Z79899 Other long term (current) drug therapy: Secondary | ICD-10-CM | POA: Diagnosis not present

## 2024-02-23 DIAGNOSIS — I10 Essential (primary) hypertension: Secondary | ICD-10-CM | POA: Insufficient documentation

## 2024-02-23 DIAGNOSIS — Z7902 Long term (current) use of antithrombotics/antiplatelets: Secondary | ICD-10-CM | POA: Insufficient documentation

## 2024-02-23 DIAGNOSIS — Z955 Presence of coronary angioplasty implant and graft: Secondary | ICD-10-CM | POA: Diagnosis not present

## 2024-02-23 DIAGNOSIS — Z7901 Long term (current) use of anticoagulants: Secondary | ICD-10-CM | POA: Diagnosis not present

## 2024-02-23 DIAGNOSIS — E119 Type 2 diabetes mellitus without complications: Secondary | ICD-10-CM | POA: Insufficient documentation

## 2024-02-23 DIAGNOSIS — I25110 Atherosclerotic heart disease of native coronary artery with unstable angina pectoris: Secondary | ICD-10-CM | POA: Diagnosis not present

## 2024-02-23 DIAGNOSIS — Z7982 Long term (current) use of aspirin: Secondary | ICD-10-CM | POA: Diagnosis not present

## 2024-02-23 DIAGNOSIS — I2510 Atherosclerotic heart disease of native coronary artery without angina pectoris: Secondary | ICD-10-CM | POA: Diagnosis present

## 2024-02-23 DIAGNOSIS — E7800 Pure hypercholesterolemia, unspecified: Secondary | ICD-10-CM | POA: Diagnosis not present

## 2024-02-23 DIAGNOSIS — I200 Unstable angina: Secondary | ICD-10-CM | POA: Diagnosis present

## 2024-02-23 HISTORY — PX: LEFT HEART CATH AND CORONARY ANGIOGRAPHY: CATH118249

## 2024-02-23 HISTORY — PX: CORONARY STENT INTERVENTION: CATH118234

## 2024-02-23 LAB — POCT ACTIVATED CLOTTING TIME: Activated Clotting Time: 297 s

## 2024-02-23 LAB — GLUCOSE, CAPILLARY: Glucose-Capillary: 118 mg/dL — ABNORMAL HIGH (ref 70–99)

## 2024-02-23 SURGERY — LEFT HEART CATH AND CORONARY ANGIOGRAPHY
Anesthesia: LOCAL

## 2024-02-23 MED ORDER — HYDRALAZINE HCL 20 MG/ML IJ SOLN: 10.0000 mg | INTRAMUSCULAR | Status: DC | PRN

## 2024-02-23 MED ORDER — LABETALOL HCL 5 MG/ML IV SOLN: 10.0000 mg | INTRAVENOUS | Status: DC | PRN

## 2024-02-23 MED ORDER — ASPIRIN 81 MG PO TBEC: 81.0000 mg | DELAYED_RELEASE_TABLET | Freq: Every day | ORAL | Status: DC

## 2024-02-23 MED ORDER — SODIUM CHLORIDE 0.9% FLUSH: 3.0000 mL | Freq: Two times a day (BID) | INTRAVENOUS | Status: DC

## 2024-02-23 MED ORDER — LIDOCAINE HCL (PF) 1 % IJ SOLN
INTRAMUSCULAR | Status: AC
  Filled 2024-02-23: qty 30

## 2024-02-23 MED ORDER — ISOSORBIDE MONONITRATE ER 60 MG PO TB24: 120.0000 mg | ORAL_TABLET | Freq: Every day | ORAL | Status: DC

## 2024-02-23 MED ORDER — VERAPAMIL HCL 2.5 MG/ML IV SOLN
INTRAVENOUS | Status: AC
Start: 2024-02-23 — End: ?
  Filled 2024-02-23: qty 2

## 2024-02-23 MED ORDER — VERAPAMIL HCL 2.5 MG/ML IV SOLN
INTRAVENOUS | Status: DC | PRN
  Administered 2024-02-23: 10 mL via INTRA_ARTERIAL

## 2024-02-23 MED ORDER — CLOPIDOGREL BISULFATE 75 MG PO TABS: 75.0000 mg | ORAL_TABLET | Freq: Every day | ORAL | Status: DC

## 2024-02-23 MED ORDER — APIXABAN 5 MG PO TABS: 5.0000 mg | ORAL_TABLET | Freq: Two times a day (BID) | ORAL | Status: DC

## 2024-02-23 MED ORDER — FENTANYL CITRATE (PF) 100 MCG/2ML IJ SOLN
INTRAMUSCULAR | Status: DC | PRN
  Administered 2024-02-23 (×2): 25 ug via INTRAVENOUS

## 2024-02-23 MED ORDER — MIDAZOLAM HCL 2 MG/2ML IJ SOLN
INTRAMUSCULAR | Status: DC | PRN
  Administered 2024-02-23 (×2): 1 mg via INTRAVENOUS

## 2024-02-23 MED ORDER — ASPIRIN 81 MG PO CHEW: 81.0000 mg | CHEWABLE_TABLET | ORAL | Status: DC

## 2024-02-23 MED ORDER — SODIUM CHLORIDE 0.9 % IV SOLN: INTRAVENOUS | Status: DC

## 2024-02-23 MED ORDER — PANTOPRAZOLE SODIUM 40 MG PO TBEC: 40.0000 mg | DELAYED_RELEASE_TABLET | Freq: Every day | ORAL | Status: DC

## 2024-02-23 MED ORDER — NITROGLYCERIN 0.4 MG SL SUBL: 0.4000 mg | SUBLINGUAL_TABLET | SUBLINGUAL | Status: DC | PRN

## 2024-02-23 MED ORDER — SODIUM CHLORIDE 0.9 % IV SOLN: 250.0000 mL | INTRAVENOUS | Status: DC | PRN

## 2024-02-23 MED ORDER — SODIUM CHLORIDE 0.9% FLUSH: 3.0000 mL | INTRAVENOUS | Status: DC | PRN

## 2024-02-23 MED ORDER — SODIUM CHLORIDE 0.9 % WEIGHT BASED INFUSION
1.0000 mL/kg/h | INTRAVENOUS | Status: DC
Start: 2024-02-23 — End: 2024-02-23

## 2024-02-23 MED ORDER — ONDANSETRON HCL 4 MG/2ML IJ SOLN: 4.0000 mg | Freq: Four times a day (QID) | INTRAMUSCULAR | Status: DC | PRN

## 2024-02-23 MED ORDER — AMLODIPINE BESYLATE 5 MG PO TABS: 5.0000 mg | ORAL_TABLET | Freq: Every day | ORAL | Status: DC

## 2024-02-23 MED ORDER — ACETAMINOPHEN 325 MG PO TABS: 650.0000 mg | ORAL_TABLET | ORAL | Status: DC | PRN

## 2024-02-23 MED ORDER — ATORVASTATIN CALCIUM 40 MG PO TABS: 40.0000 mg | ORAL_TABLET | Freq: Every day | ORAL | Status: DC

## 2024-02-23 MED ORDER — HEPARIN SODIUM (PORCINE) 1000 UNIT/ML IJ SOLN
INTRAMUSCULAR | Status: DC | PRN
  Administered 2024-02-23: 7000 [IU] via INTRAVENOUS
  Administered 2024-02-23: 5000 [IU] via INTRAVENOUS

## 2024-02-23 MED ORDER — HEPARIN (PORCINE) IN NACL 1000-0.9 UT/500ML-% IV SOLN
INTRAVENOUS | Status: DC | PRN
Start: 2024-02-23 — End: 2024-02-23
  Administered 2024-02-23 (×3): 500 mL

## 2024-02-23 MED ORDER — FENTANYL CITRATE (PF) 100 MCG/2ML IJ SOLN
INTRAMUSCULAR | Status: AC
  Filled 2024-02-23: qty 2

## 2024-02-23 MED ORDER — SODIUM CHLORIDE 0.9 % WEIGHT BASED INFUSION: 3.0000 mL/kg/h | INTRAVENOUS | Status: DC

## 2024-02-23 MED ORDER — HEPARIN SODIUM (PORCINE) 1000 UNIT/ML IJ SOLN
INTRAMUSCULAR | Status: AC
  Filled 2024-02-23: qty 10

## 2024-02-23 MED ORDER — HYDRALAZINE HCL 20 MG/ML IJ SOLN
INTRAMUSCULAR | Status: AC
  Filled 2024-02-23: qty 1

## 2024-02-23 MED ORDER — IOHEXOL 350 MG/ML SOLN
INTRAVENOUS | Status: DC | PRN
  Administered 2024-02-23: 70 mL

## 2024-02-23 MED ORDER — METOPROLOL TARTRATE 12.5 MG HALF TABLET: 25.0000 mg | ORAL_TABLET | Freq: Two times a day (BID) | ORAL | Status: DC

## 2024-02-23 MED ORDER — MIDAZOLAM HCL 2 MG/2ML IJ SOLN
INTRAMUSCULAR | Status: AC
  Filled 2024-02-23: qty 2

## 2024-02-23 MED ORDER — HYDRALAZINE HCL 20 MG/ML IJ SOLN
INTRAMUSCULAR | Status: DC | PRN
  Administered 2024-02-23: 10 mg via INTRAVENOUS

## 2024-02-23 MED ORDER — SODIUM CHLORIDE 0.9 % IV SOLN
INTRAVENOUS | Status: DC | PRN
  Administered 2024-02-23: 10 mL/h via INTRAVENOUS

## 2024-02-23 MED ORDER — LIDOCAINE HCL (PF) 1 % IJ SOLN
INTRAMUSCULAR | Status: DC | PRN
  Administered 2024-02-23: 2 mL via INTRADERMAL

## 2024-02-23 MED ORDER — NITROGLYCERIN 1 MG/10 ML FOR IR/CATH LAB
INTRA_ARTERIAL | Status: AC
  Filled 2024-02-23: qty 10

## 2024-02-23 SURGICAL SUPPLY — 17 items
BALLN EMERGE MR 2.0X15 (BALLOONS) ×1 IMPLANT
BALLN ~~LOC~~ EMERGE MR 2.5X15 (BALLOONS) ×1 IMPLANT
BALLOON EMERGE MR 2.0X15 (BALLOONS) IMPLANT
BALLOON ~~LOC~~ EMERGE MR 2.5X15 (BALLOONS) IMPLANT
CATH 5FR JL3.5 JR4 ANG PIG MP (CATHETERS) IMPLANT
CATH VISTA GUIDE 6FR XB3.5 EPK (CATHETERS) IMPLANT
DEVICE RAD COMP TR BAND LRG (VASCULAR PRODUCTS) IMPLANT
ELECT DEFIB PAD ADLT CADENCE (PAD) IMPLANT
GLIDESHEATH SLEND SS 6F .021 (SHEATH) IMPLANT
GUIDEWIRE INQWIRE 1.5J.035X260 (WIRE) IMPLANT
INQWIRE 1.5J .035X260CM (WIRE) ×1 IMPLANT
KIT ENCORE 26 ADVANTAGE (KITS) IMPLANT
PACK CARDIAC CATHETERIZATION (CUSTOM PROCEDURE TRAY) ×1 IMPLANT
SET ATX-X65L (MISCELLANEOUS) IMPLANT
STENT SYNERGY XD 2.25X20 (Permanent Stent) IMPLANT
SYNERGY XD 2.25X20 (Permanent Stent) ×1 IMPLANT
WIRE ASAHI PROWATER 180CM (WIRE) IMPLANT

## 2024-02-23 NOTE — Discharge Instructions (Signed)
NO METFORMIN FOR 2 DAYS 

## 2024-02-23 NOTE — Interval H&P Note (Signed)
 History and Physical Interval Note:  02/23/2024 7:24 AM  Kyle Lambert  has presented today for surgery, with the diagnosis of angina.  The various methods of treatment have been discussed with the patient and family. After consideration of risks, benefits and other options for treatment, the patient has consented to  Procedure(s): LEFT HEART CATH AND CORONARY ANGIOGRAPHY (N/A) as a surgical intervention.  The patient's history has been reviewed, patient examined, no change in status, stable for surgery.  I have reviewed the patient's chart and labs.  Questions were answered to the patient's satisfaction.    Cath Lab Visit (complete for each Cath Lab visit)  Clinical Evaluation Leading to the Procedure:   ACS: No.  Non-ACS:    Anginal Classification: CCS III  Anti-ischemic medical therapy: Maximal Therapy (2 or more classes of medications)  Non-Invasive Test Results: No non-invasive testing performed  Prior CABG: No previous CABG    Verne Carrow

## 2024-02-23 NOTE — Discharge Summary (Signed)
 Discharge Summary for Same Day PCI   Patient ID: Kyle Lambert MRN: [id number]; DOB: 29-Nov-1950  Admit date: 02/23/2024 Discharge date: 02/23/2024  Primary Care Provider: Juliette Alcide, MD  Primary Cardiologist: Kristeen Miss, MD  Primary Electrophysiologist:  None   Discharge Diagnoses    Active Problems:   CAD (coronary artery disease)  Diagnostic Studies/Procedures    Cardiac Catheterization 02/23/2024:    Mid Cx to Dist Cx lesion is 40% stenosed.   Prox RCA lesion is 10% stenosed.   Mid RCA lesion is 40% stenosed.   Mid RCA to Dist RCA lesion is 30% stenosed.   Ost 1st Diag lesion is 50% stenosed.   Ost RPDA to RPDA lesion is 50% stenosed.   Ost 1st Mrg to 1st Mrg lesion is 80% stenosed.   Previously placed Ost LAD to Prox LAD stent of unknown type is  widely patent.   Non-stenotic Mid LAD lesion was previously treated.   A drug-eluting stent was successfully placed using a SYNERGY XD 2.25X20.   Post intervention, there is a 0% residual stenosis.   Patent ostial/proximal LAD stent. Patent mid LAD stent Severe stenosis first obtuse marginal branch.  Successful PTCA/DES x 1 obtuse marginal branch The large, dominant RCA has a patent proximal stented segment with minimal restenosis.  Moderate stenosis in the PDA   Recommendations: Continue triple therapy with ASA/Plavix for one month along with Eliquis. Stop ASA in one month and continue Plavix/Eliquis. Same day post PCI discharge.    Diagnostic Dominance: Right  Intervention   _____________   History of Present Illness     Kyle Lambert is a 73 y.o. male with past medical history of CAD s/p DES to RCA '13, balloon angioplasty to first diagonal '14, hypertension, hyperlipidemia, diabetes who was recently admitted at the end of March with a non-STEMI and underwent cardiac catheterization showing 90% stenosis of distal left main and ostial LAD, 70% stenosis of proximal LAD, 40% stenosis of mid to distal circumflex  with prior stent to LAD and RCA patent.  Had successful PCI with PTCA/shockwave intracoronary lithotripsy with DES to ostial/proximal LAD.  Recommendations for triple therapy with aspirin, Plavix and Eliquis for 1 month, then stop aspirin.  Also found to be in new onset atrial fibrillation during that admission.  He was discharged on 3/29.  Seen in the office on 4/1 with continued episodes of chest pain.  Reported pain was reminiscent to what he initially presented to the hospital with this is NSTEMI.  Given concerns that he was set up for outpatient cardiac catheterization.  Hospital Course     The patient underwent cardiac cath as noted above with patent ostial/proximal LAD stent, as well as mid LAD stent with severe stenosis of first OM branch treated with PCI/DES x 1.. Plan for DAPT with ASA/Plavix, Eliquis for 1 month then stop aspirin with continuation of Plavix and Eliquis. The patient was seen by cardiac rehab while in short stay. There were no observed complications post cath. Radial cath site was re-evaluated prior to discharge and found to be stable without any complications. Instructions/precautions regarding cath site care were given prior to discharge.  Kyle Lambert was seen by Dr. Clifton James and determined stable for discharge home. Follow up with our office has been arranged. Medications are listed below. Pertinent changes include n/a.    _____________  Cath/PCI Registry Performance & Quality Measures: Aspirin prescribed? - Yes ADP Receptor Inhibitor (Plavix/Clopidogrel, Brilinta/Ticagrelor or Effient/Prasugrel) prescribed (includes medically managed  patients)? - Yes High Intensity Statin (Lipitor 40-80mg  or Crestor 20-40mg ) prescribed? - Yes For EF <40%, was ACEI/ARB prescribed? - Not Applicable (EF >/= 40%) For EF <40%, Aldosterone Antagonist (Spironolactone or Eplerenone) prescribed? - Not Applicable (EF >/= 40%) Cardiac Rehab Phase II ordered (Included Medically managed Patients)?  - Yes  _____________   Discharge Vitals Blood pressure 114/73, pulse (!) 0, temperature 97.7 F (36.5 C), temperature source Oral, resp. rate (!) 22, height 5\' 9"  (1.753 m), weight 103.4 kg, SpO2 96%.  Filed Weights   02/23/24 0549  Weight: 103.4 kg    Last Labs & Radiologic Studies    CBC No results for input(s): "WBC", "NEUTROABS", "HGB", "HCT", "MCV", "PLT" in the last 72 hours. Basic Metabolic Panel No results for input(s): "NA", "K", "CL", "CO2", "GLUCOSE", "BUN", "CREATININE", "CALCIUM", "MG", "PHOS" in the last 72 hours. Liver Function Tests No results for input(s): "AST", "ALT", "ALKPHOS", "BILITOT", "PROT", "ALBUMIN" in the last 72 hours. No results for input(s): "LIPASE", "AMYLASE" in the last 72 hours. High Sensitivity Troponin:   Recent Labs  Lab 02/16/24 1233 02/16/24 1428  TROPONINIHS 11 32*    BNP Invalid input(s): "POCBNP" D-Dimer No results for input(s): "DDIMER" in the last 72 hours. Hemoglobin A1C No results for input(s): "HGBA1C" in the last 72 hours. Fasting Lipid Panel No results for input(s): "CHOL", "HDL", "LDLCALC", "TRIG", "CHOLHDL", "LDLDIRECT" in the last 72 hours. Thyroid Function Tests No results for input(s): "TSH", "T4TOTAL", "T3FREE", "THYROIDAB" in the last 72 hours.  Invalid input(s): "FREET3" _____________  CARDIAC CATHETERIZATION Result Date: 02/23/2024   Mid Cx to Dist Cx lesion is 40% stenosed.   Prox RCA lesion is 10% stenosed.   Mid RCA lesion is 40% stenosed.   Mid RCA to Dist RCA lesion is 30% stenosed.   Ost 1st Diag lesion is 50% stenosed.   Ost RPDA to RPDA lesion is 50% stenosed.   Ost 1st Mrg to 1st Mrg lesion is 80% stenosed.   Previously placed Ost LAD to Prox LAD stent of unknown type is  widely patent.   Non-stenotic Mid LAD lesion was previously treated.   A drug-eluting stent was successfully placed using a SYNERGY XD 2.25X20.   Post intervention, there is a 0% residual stenosis. Patent ostial/proximal LAD stent. Patent  mid LAD stent Severe stenosis first obtuse marginal branch. Successful PTCA/DES x 1 obtuse marginal branch The large, dominant RCA has a patent proximal stented segment with minimal restenosis. Moderate stenosis in the PDA Recommendations: Continue triple therapy with ASA/Plavix for one month along with Eliquis. Stop ASA in one month and continue Plavix/Eliquis. Same day post PCI discharge.   CARDIAC CATHETERIZATION Result Date: 02/17/2024   Mid Cx to Dist Cx lesion is 40% stenosed.   Ost 1st Mrg to 1st Mrg lesion is 60% stenosed.   Dist LM to Ost LAD lesion is 90% stenosed.   Prox LAD lesion is 70% stenosed.   Previously placed Mid LAD stent of unknown type is  widely patent.   A drug-eluting stent was successfully placed using a STENT ONYX FRONTIER 3.0X30. Severe heavily calcified ostial/proximal LAD stenosis. Successful PTCA/Shockwave IC lithotripsy/DES x 1 ostial/proximal LAD Patent distal LAD stent Moderate non-obstructive disease in the diagonal branch The Circumflex is a large caliber vessel with a large obtuse marginal branch. The obtuse marginal branch has moderate proximal stenosis. The large, dominant RCA has a patent proximal stented segment with minimal restenosis. Moderate stenosis in the PDA LVEDP=13 mmHg Recommendations: Continue DAPT with ASA and  Plavix for one month. Then stop ASA and continue Plavix along with Eliquis. (Discharge on triple therapy: ASA, Plavix, Eliquis). Medical management of moderate stenosis in the obtuse marginal branch.   ECHOCARDIOGRAM COMPLETE Result Date: 02/17/2024    ECHOCARDIOGRAM REPORT   Patient Name:   Kyle Lambert Date of Exam: 02/17/2024 Medical Rec #:  [id number]     Height:       69.0 in Accession #:    [id number]    Weight:       224.8 lb Date of Birth:  06/12/1951     BSA:          2.171 m Patient Age:    72 years      BP:           121/69 mmHg Patient Gender: M             HR:           61 bpm. Exam Location:  Inpatient Procedure: 2D Echo, Cardiac Doppler,  Color Doppler and Intracardiac            Opacification Agent (Both Spectral and Color Flow Doppler were            utilized during procedure). Indications:    Chest pain  History:        Patient has prior history of Echocardiogram examinations, most                 recent 06/07/2023. CAD; Risk Factors:Diabetes, Hypertension and                 Dyslipidemia. NSTEMI.  Sonographer:    Vern Claude Referring Phys: Arabella Merles  Sonographer Comments: Suboptimal subcostal window, suboptimal parasternal window and suboptimal apical window. IMPRESSIONS  1. Left ventricular ejection fraction, by estimation, is 60 to 65%. The left ventricle has normal function. The left ventricle has no regional wall motion abnormalities. Left ventricular diastolic parameters were normal.  2. Right ventricular systolic function is normal. The right ventricular size is normal.  3. The mitral valve is normal in structure. Trivial mitral valve regurgitation. No evidence of mitral stenosis.  4. The aortic valve is tricuspid. Aortic valve regurgitation is not visualized. No aortic stenosis is present. FINDINGS  Left Ventricle: Left ventricular ejection fraction, by estimation, is 60 to 65%. The left ventricle has normal function. The left ventricle has no regional wall motion abnormalities. The left ventricular internal cavity size was normal in size. There is  no left ventricular hypertrophy. Left ventricular diastolic parameters were normal. Right Ventricle: The right ventricular size is normal. No increase in right ventricular wall thickness. Right ventricular systolic function is normal. Left Atrium: Left atrial size was normal in size. Right Atrium: Right atrial size was normal in size. Pericardium: Trivial pericardial effusion is present. Mitral Valve: The mitral valve is normal in structure. Trivial mitral valve regurgitation. No evidence of mitral valve stenosis. MV peak gradient, 1.8 mmHg. The mean mitral valve gradient is 1.0 mmHg.  Tricuspid Valve: The tricuspid valve is not well visualized. Tricuspid valve regurgitation is trivial. No evidence of tricuspid stenosis. Aortic Valve: The aortic valve is tricuspid. Aortic valve regurgitation is not visualized. No aortic stenosis is present. Aortic valve mean gradient measures 2.0 mmHg. Aortic valve peak gradient measures 4.1 mmHg. Aortic valve area, by VTI measures 3.87 cm. Pulmonic Valve: The pulmonic valve was not well visualized. Pulmonic valve regurgitation is not visualized. No evidence of pulmonic stenosis. Aorta: The aortic root and ascending  aorta are structurally normal, with no evidence of dilitation. Venous: The inferior vena cava was not well visualized. IAS/Shunts: The interatrial septum was not well visualized.  LEFT VENTRICLE PLAX 2D LVIDd:         4.20 cm      Diastology LVIDs:         2.70 cm      LV e' medial:    5.98 cm/s LV PW:         1.00 cm      LV E/e' medial:  10.1 LV IVS:        0.90 cm      LV e' lateral:   6.09 cm/s LVOT diam:     2.10 cm      LV E/e' lateral: 9.9 LV SV:         76 LV SV Index:   35 LVOT Area:     3.46 cm  LV Volumes (MOD) LV vol d, MOD A2C: 81.7 ml LV vol d, MOD A4C: 157.0 ml LV vol s, MOD A2C: 28.8 ml LV vol s, MOD A4C: 47.2 ml LV SV MOD A2C:     52.9 ml LV SV MOD A4C:     157.0 ml LV SV MOD BP:      77.5 ml RIGHT VENTRICLE RV Basal diam:  3.60 cm RV Mid diam:    1.40 cm RV S prime:     10.00 cm/s LEFT ATRIUM             Index        RIGHT ATRIUM          Index LA diam:        3.00 cm 1.38 cm/m   RA Area:     8.44 cm LA Vol (A2C):   64.7 ml 29.80 ml/m  RA Volume:   11.60 ml 5.34 ml/m LA Vol (A4C):   32.5 ml 14.97 ml/m LA Biplane Vol: 49.9 ml 22.98 ml/m  AORTIC VALVE                    PULMONIC VALVE AV Area (Vmax):    3.57 cm     PV Vmax:       0.84 m/s AV Area (Vmean):   3.42 cm     PV Peak grad:  2.8 mmHg AV Area (VTI):     3.87 cm AV Vmax:           101.00 cm/s AV Vmean:          73.100 cm/s AV VTI:            0.195 m AV Peak Grad:       4.1 mmHg AV Mean Grad:      2.0 mmHg LVOT Vmax:         104.00 cm/s LVOT Vmean:        72.100 cm/s LVOT VTI:          0.218 m LVOT/AV VTI ratio: 1.12  AORTA Ao Root diam: 3.20 cm Ao Asc diam:  3.10 cm MITRAL VALVE MV Area (PHT): 2.11 cm    SHUNTS MV Area VTI:   3.97 cm    Systemic VTI:  0.22 m MV Peak grad:  1.8 mmHg    Systemic Diam: 2.10 cm MV Mean grad:  1.0 mmHg MV Vmax:       0.67 m/s MV Vmean:      41.4 cm/s MV Decel Time: 359 msec MV E velocity: 60.30 cm/s  MV A velocity: 71.80 cm/s MV E/A ratio:  0.84 Sunit Tolia Electronically signed by Tessa Lerner Signature Date/Time: 02/17/2024/12:45:21 PM    Final    DG Chest Portable 1 View Result Date: 02/16/2024 CLINICAL DATA:  Chest pain. EXAM: PORTABLE CHEST 1 VIEW COMPARISON:  Chest radiograph dated 04/19/2020. FINDINGS: The heart size and mediastinal contours are within normal limits. No focal consolidation, sizeable pleural effusion, or pneumothorax. No acute osseous abnormality. IMPRESSION: No acute cardiopulmonary findings. Electronically Signed   By: Hart Robinsons M.D.   On: 02/16/2024 13:30    Disposition   Pt is being discharged home today in good condition.  Follow-up Plans & Appointments     Discharge Instructions     Amb Referral to Cardiac Rehabilitation   Complete by: As directed    Diagnosis: Coronary Stents   After initial evaluation and assessments completed: Virtual Based Care may be provided alone or in conjunction with Phase 2 Cardiac Rehab based on patient barriers.: Yes   Intensive Cardiac Rehabilitation (ICR) [facility] location only OR Traditional Cardiac Rehabilitation (TCR) *If criteria for ICR are not met will enroll in TCR [facility] only): Yes        Discharge Medications   Allergies as of 02/23/2024   No Known Allergies      Medication List     STOP taking these medications    tamsulosin 0.4 MG Caps capsule Commonly known as: FLOMAX       TAKE these medications    amLODipine 5 MG tablet Commonly known  as: NORVASC TAKE 1 TABLET DAILY   aspirin EC 81 MG tablet Take 81 mg by mouth daily.   atorvastatin 40 MG tablet Commonly known as: LIPITOR Take 40 mg by mouth at bedtime.   clopidogrel 75 MG tablet Commonly known as: PLAVIX Take 1 tablet (75 mg total) by mouth daily.   cyclobenzaprine 10 MG tablet Commonly known as: FLEXERIL Take 10 mg by mouth daily as needed for muscle spasms.   Eliquis 5 MG Tabs tablet Generic drug: apixaban Take 1 tablet (5 mg total) by mouth 2 (two) times daily.   FeroSul 325 (65 Fe) MG tablet Generic drug: ferrous sulfate Take 1 tablet (325 mg total) by mouth 2 (two) times daily.   isosorbide mononitrate 120 MG 24 hr tablet Commonly known as: Imdur Take 1 tablet (120 mg total) by mouth daily.   meclizine 25 MG tablet Commonly known as: ANTIVERT Take 25 mg by mouth 3 (three) times daily as needed for dizziness.   metFORMIN 1000 MG tablet Commonly known as: GLUCOPHAGE Take 1,000 mg by mouth 2 (two) times daily.   metoprolol tartrate 25 MG tablet Commonly known as: LOPRESSOR TAKE 1 TABLET TWICE A DAY   nitroGLYCERIN 0.4 MG SL tablet Commonly known as: NITROSTAT Place 1 tablet (0.4 mg total) under the tongue every 5 (five) minutes x 3 doses as needed for chest pain.   pantoprazole 40 MG tablet Commonly known as: PROTONIX TAKE 1 TABLET BY MOUTH ONCE DAILY --  NEEDS  APPOINTMENT  FOR  FURTHER  REFILLS What changed: See the new instructions.   PRESERVISION AREDS 2 PO Take 1 tablet by mouth in the morning and at bedtime.   silodosin 8 MG Caps capsule Commonly known as: RAPAFLO Take 8 mg by mouth daily with breakfast.           Allergies No Known Allergies  Outstanding Labs/Studies   N/a   Duration of Discharge Encounter   Greater than 30 minutes  including physician time.  Signed, Laverda Page, NP 02/23/2024, 12:31 PM

## 2024-02-23 NOTE — Progress Notes (Signed)
 Pt was educated on stent card, stent location, Antiplatelet and ASA use, wt restrictions, no baths/daily wash-ups, s/s of infection, ex guidelines, s/s to stop exercising, NTG use and calling 911, heart healthy diet, risk factors and CRPII. Pt received materials on exercise, diet, and CRPII. Will refer to AP.   Faustino Congress 02/23/2024 11:14 AM

## 2024-02-24 MED FILL — Nitroglycerin IV Soln 100 MCG/ML in D5W: INTRA_ARTERIAL | Qty: 10 | Status: AC

## 2024-02-28 ENCOUNTER — Ambulatory Visit: Admitting: Physician Assistant

## 2024-02-28 LAB — POCT ACTIVATED CLOTTING TIME: Activated Clotting Time: 279 s

## 2024-02-29 ENCOUNTER — Ambulatory Visit: Admitting: Physician Assistant

## 2024-03-01 DIAGNOSIS — N200 Calculus of kidney: Secondary | ICD-10-CM | POA: Diagnosis not present

## 2024-03-01 DIAGNOSIS — N401 Enlarged prostate with lower urinary tract symptoms: Secondary | ICD-10-CM | POA: Diagnosis not present

## 2024-03-01 DIAGNOSIS — Z1329 Encounter for screening for other suspected endocrine disorder: Secondary | ICD-10-CM | POA: Diagnosis not present

## 2024-03-01 DIAGNOSIS — N3281 Overactive bladder: Secondary | ICD-10-CM | POA: Diagnosis not present

## 2024-03-01 DIAGNOSIS — Q631 Lobulated, fused and horseshoe kidney: Secondary | ICD-10-CM | POA: Diagnosis not present

## 2024-03-01 DIAGNOSIS — R350 Frequency of micturition: Secondary | ICD-10-CM | POA: Diagnosis not present

## 2024-03-01 DIAGNOSIS — R300 Dysuria: Secondary | ICD-10-CM | POA: Diagnosis not present

## 2024-03-01 DIAGNOSIS — R5383 Other fatigue: Secondary | ICD-10-CM | POA: Diagnosis not present

## 2024-03-01 DIAGNOSIS — Z13220 Encounter for screening for lipoid disorders: Secondary | ICD-10-CM | POA: Diagnosis not present

## 2024-03-01 DIAGNOSIS — N12 Tubulo-interstitial nephritis, not specified as acute or chronic: Secondary | ICD-10-CM | POA: Diagnosis not present

## 2024-03-01 DIAGNOSIS — R739 Hyperglycemia, unspecified: Secondary | ICD-10-CM | POA: Diagnosis not present

## 2024-03-01 LAB — LAB REPORT - SCANNED
A1c: 6.5
Albumin, Urine POC: 31.5
Albumin/Creatinine Ratio, Urine, POC: 31
Creatinine, POC: 102.5 mg/dL
EGFR: 95.6
TSH: 6.67 — AB (ref 0.41–5.90)

## 2024-03-05 ENCOUNTER — Encounter: Payer: Self-pay | Admitting: Cardiovascular Disease

## 2024-03-05 NOTE — Progress Notes (Unsigned)
 Cardiology Office Note:    Date:  03/06/2024   ID:  Kyle Lambert, Kyle Lambert 11-23-1950, MRN [id number]  PCP:  Juliette Alcide, MD    [facility] Providers Cardiologist:  Kristeen Miss, MD     Referring MD: Juliette Alcide, MD   Chief Complaint  Patient presents with   Coronary Artery Disease         History of Present Illness:     Seen with wife , [name]    Kyle Lambert is a 73 y.o. male with a hx of CAD Is a former patient of Dr. Earmon Phoenix.  Was last seen in 2020 He has a history of coronary artery disease with DES to the RCA in 2013.  He also has a DES to the mid LAD and balloon angioplasty to the first diagonal.  Heart catheterization in March, 2020 revealed patent stents in the LAD and RCA with moderate residual restenosis in the small to medium sized first diagonal vessel.  His primary MD recommended that he be seen again  Is some left sided chest pain These are not similar to his angina  Does not having to take SL NTG  Sharp pain ,  lasts for 10-12 seconds  Not with activity Started hurting yesterday while driving his car  Does not get much exercise Walks around in the yard, perhaps 1/2 mile each day   Used to walk 2 miles a day , now he gets out of breath when walking  Avoids salt for the most part  Was having hematura last month He stopped the plavix and continued ASA  I'll have him restart the plavix Hold ASA  Will get an echo to evaluate his DOE  [name] to evaluate his left chest pain    Oct. 21, 2024 No show, patient cancelled  Nov. 12, 2024  Kyle Lambert is seen for follow up of his CAD  Former patient of Excell Seltzer, has had several PCIs Reported some chest pain when I saw him several months ago Echo reveals normal LV function with EF 60-65% Trivial MR  Myoview study was normal .  No ischemia,   Is having some left sided chest soreness.  Has been present for 2 1/2 weeks Seems to be a muscle ache Not worsened with exercise Does  not feel like his previous heart pain   Lexiscan myoview in July 2024 was normal , low risk    Exercises several times a week  - will walk 1/2 mile several times a day .  Hx of HTN   February 21, 2024 Kyle Lambert is seen today for continued episodes of chest pain.  He was recently admitted to the hospital for chest pain.  Heart catheterization on March 28 revealed a tight distal left main/LAD stenosis.  This lesion was successfully stented.  I have reviewed the cath films and Dr. Clifton James was able to get a very nice result with the stent placement   He has had recurrent CP following his coronary intervention  Feels like his angina pain  Chest pressure  Not quite a severe as when he presented with his NSTEMI    Had an episode when he woke up He had CP again when he was walking out in the back yard  Lasts for 20-25 min He did not take a NTG   ECG today is non acute  He's on Imdur 120 mg a day  Metoprolol 25 mg BID  ASA 81 mg every day Plavix 75 mg  a day  Eliquis 5 mg PO BID   Wife says he has been managing his meds,  she wants to assume responsibility for his meds    March 06, 2024: Kyle Lambert is seen today for follow-up visit.  He has a history of coronary artery disease.  When I last saw him he was still having significant angina pains 5 days out from his previous stenting procedure.  I scheduled him for a relook heart catheterization. He has previously placed stent in the ostial LAD was widely patent.  He had a drug-eluting stent placed in his first obtuse marginal artery.  His angina has resolved after his AM stenting  He is feeling quite well.  The plan is for aspirin, Plavix, Eliquis for a month following this last stent procedure.  After 1 month he may discontinue the aspirin and continue Plavix and Eliquis.   He brought labs from his primary medical doctor. He CBC is within normal limits Basic metabolic profile shows normal potassium at 4.6.  His BUN is 12.  Glucose is  slightly elevated at 127.  His creatinine is 0.73.  Hemoglobin A1c is 6.5.  Lipid panel from March 01, 2024 reveals Total cholesterol is 108 HDL equals 42 LDL is 31.8 Triglyceride levels 171  TSH is 6.669.  Past Medical History:  Diagnosis Date   Angina pectoris (HCC)    Arthritis    Bladder cancer (HCC) 03/2020   Bladder carcinoma (HCC)    BMI 35.0-35.9,adult    CAD (coronary artery disease)    a. 03/2012 Cath: LAD 80, RCA 80, nl EF;  b. PCI of RCA w 3.0x53mm Promus DES, FFR of LAD was nl @ 0.85 ->Med Rx.;  c. LHC 02/15/13: proximal LAD 40-50%, mid LAD 95%, small D1 95-99%, mid CFX 30-40%, proximal RCA stent patent with ostial 20-30% prior to the stent, ostial PDA 50%, EF 55-65%. PCI:  Promus DES to the mid LAD and POBA to the proximal D1    Chest pain    COVID    DM (diabetes mellitus) (HCC)    Elevated TSH    Headache    History of angina    Hypercholesterolemia    Hyperlipidemia    Hypertension    Hypotension    Kidney stone    Knee pain    Low back pain    Morbid obesity (HCC)    OA (osteoarthritis)    Obesity    OSA (obstructive sleep apnea)    Sepsis (HCC)    Skin lesion    Tension headache    Tinnitus, bilateral    Umbilical hernia    UTI (urinary tract infection)     Past Surgical History:  Procedure Laterality Date   CORONARY ANGIOPLASTY WITH STENT PLACEMENT  04/24/12   DES-RCA   CORONARY ANGIOPLASTY WITH STENT PLACEMENT  02/15/13   Patent RCA stent, diffuse nonobstructive R PDA, LCx disease, 99% mid LAD stenosis s/p DES-mid LAD; LVEF 60%   CORONARY LITHOTRIPSY N/A 02/17/2024   Procedure: CORONARY LITHOTRIPSY;  Surgeon: Kathleene Hazel, MD;  Location: [facility];  Service: Cardiovascular;  Laterality: N/A;   CORONARY STENT INTERVENTION N/A 02/17/2024   Procedure: CORONARY STENT INTERVENTION;  Surgeon: Kathleene Hazel, MD;  Location: [facility];  Service: Cardiovascular;  Laterality: N/A;   CORONARY STENT INTERVENTION N/A  02/23/2024   Procedure: CORONARY STENT INTERVENTION;  Surgeon: Kathleene Hazel, MD;  Location: [facility];  Service: Cardiovascular;  Laterality: N/A;  OM1  CORONARY STENT PLACEMENT     CYSTOSCOPY W/ RETROGRADES  04/07/2020   Procedure: CYSTOSCOPY WITH LEFT RETROGRADE PYELOGRAM;  Surgeon: Marco Severs, MD;  Location: [facility];  Service: Urology;;   CYSTOSCOPY WITH BIOPSY  04/07/2020   Procedure: CYSTOSCOPY WITH BLADDER BIOPSY;  Surgeon: Marco Severs, MD;  Location: [facility];  Service: Urology;;   Orin Birk WITH FULGERATION  04/07/2020   Procedure: Orin Birk WITH FULGERATION;  Surgeon: Marco Severs, MD;  Location: [facility];  Service: Urology;;   CYSTOSCOPY/URETEROSCOPY/HOLMIUM LASER/STENT PLACEMENT Left 04/07/2020   Procedure: CYSTOSCOPY/LEFT URETEROSCOPY/HOLMIUM LASER LITHOTRIPSY/LEFT URETERAL STENT PLACEMENT;  Surgeon: Marco Severs, MD;  Location: [facility];  Service: Urology;  Laterality: Left;   HERNIA REPAIR Left inguinal   LEFT HEART CATH AND CORONARY ANGIOGRAPHY N/A 01/31/2019   Procedure: LEFT HEART CATH AND CORONARY ANGIOGRAPHY;  Surgeon: Arnoldo Lapping, MD;  Location[facility] CV LAB;  Service: Cardiovascular;  Laterality: N/A;   LEFT HEART CATH AND CORONARY ANGIOGRAPHY N/A 02/17/2024   Procedure: LEFT HEART CATH AND CORONARY ANGIOGRAPHY;  Surgeon: Odie Benne, MD;  Location: [facility];  Service: Cardiovascular;  Laterality: N/A;   LEFT HEART CATH AND CORONARY ANGIOGRAPHY N/A 02/23/2024   Procedure: LEFT HEART CATH AND CORONARY ANGIOGRAPHY;  Surgeon: Odie Benne, MD;  Location: [facility];  Service: Cardiovascular;  Laterality: N/A;   LEFT HEART CATHETERIZATION WITH CORONARY ANGIOGRAM N/A 04/21/2012   Procedure: LEFT HEART CATHETERIZATION WITH CORONARY ANGIOGRAM;  Surgeon: Kristopher Pheasant, MD;  Location: [facility] CATH LAB;  Service: Cardiovascular;  Laterality: N/A;   LEFT HEART CATHETERIZATION WITH CORONARY ANGIOGRAM N/A  02/15/2013   Procedure: LEFT HEART CATHETERIZATION WITH CORONARY ANGIOGRAM;  Surgeon: Arnoldo Lapping, MD;  Location: [facility] CATH LAB;  Service: Cardiovascular;  Laterality: N/A;   LITHOTRIPSY     PERCUTANEOUS CORONARY STENT INTERVENTION (PCI-S) N/A 04/24/2012   Procedure: PERCUTANEOUS CORONARY STENT INTERVENTION (PCI-S);  Surgeon: Arnoldo Lapping, MD;  Location[facility] CATH LAB;  Service: Cardiovascular;  Laterality: N/A;   PERCUTANEOUS CORONARY STENT INTERVENTION (PCI-S)  02/15/2013   Procedure: PERCUTANEOUS CORONARY STENT INTERVENTION (PCI-S);  Surgeon: Arnoldo Lapping, MD;  Location: [facility] CATH LAB;  Service: Cardiovascular;;    Current Medications: Current Meds  Medication Sig   amLODipine (NORVASC) 5 MG tablet TAKE 1 TABLET DAILY   apixaban (ELIQUIS) 5 MG TABS tablet Take 1 tablet (5 mg total) by mouth 2 (two) times daily.   aspirin EC 81 MG tablet Take 81 mg by mouth daily.   atorvastatin (LIPITOR) 40 MG tablet Take 40 mg by mouth at bedtime.    clopidogrel (PLAVIX) 75 MG tablet Take 1 tablet (75 mg total) by mouth daily.   cyclobenzaprine (FLEXERIL) 10 MG tablet Take 10 mg by mouth daily as needed for muscle spasms.   Ferrous Sulfate (IRON) 325 (65 Fe) MG TABS Take 1 tablet (325 mg total) by mouth 2 (two) times daily.   isosorbide mononitrate (IMDUR) 120 MG 24 hr tablet Take 1 tablet (120 mg total) by mouth daily.   meclizine (ANTIVERT) 25 MG tablet Take 25 mg by mouth 3 (three) times daily as needed for dizziness.   metFORMIN (GLUCOPHAGE) 1000 MG tablet Take 1,000 mg by mouth 2 (two) times daily.   metoprolol tartrate (LOPRESSOR) 25 MG tablet TAKE 1 TABLET TWICE A DAY (Patient taking differently: Take 25 mg by mouth 2 (two) times daily.)   Multiple Vitamins-Minerals (PRESERVISION AREDS 2 PO) Take 1 tablet by mouth in the morning and at bedtime.  nitroGLYCERIN (NITROSTAT) 0.4 MG SL tablet Place 1 tablet (0.4 mg total) under the tongue every 5 (five) minutes x 3 doses as needed for chest pain.    pantoprazole (PROTONIX) 40 MG tablet TAKE 1 TABLET BY MOUTH ONCE DAILY --  NEEDS  APPOINTMENT  FOR  FURTHER  REFILLS (Patient taking differently: Take 40 mg by mouth daily.)   silodosin (RAPAFLO) 8 MG CAPS capsule Take 8 mg by mouth daily with breakfast.     Allergies:   Patient has no known allergies.   Social History   Socioeconomic History   Marital status: Married    Spouse name: [name]   Number of children: 1   Years of education: Not on file   Highest education level: Not on file  Occupational History   Not on file  Tobacco Use   Smoking status: Never   Smokeless tobacco: Never  Vaping Use   Vaping status: Never Used  Substance and Sexual Activity   Alcohol use: No    Comment: " quit along time ago"   Drug use: No   Sexual activity: Not Currently  Other Topics Concern   Not on file  Social History Narrative   Lives with wife, dgtr, 2 grandsons live with   Social Drivers of Health   Financial Resource Strain: Not on file  Food Insecurity: No Food Insecurity (02/16/2024)   Hunger Vital Sign    Worried About Running Out of Food in the Last Year: Never true    Ran Out of Food in the Last Year: Never true  Transportation Needs: No Transportation Needs (02/16/2024)   PRAPARE - Administrator, Civil Service (Medical): No    Lack of Transportation (Non-Medical): No  Physical Activity: Not on file  Stress: Not on file  Social Connections: Socially Integrated (02/16/2024)   Social Connection and Isolation Panel [NHANES]    Frequency of Communication with Friends and Family: More than three times a week    Frequency of Social Gatherings with Friends and Family: Never    Attends Religious Services: More than 4 times per year    Active Member of [employer] or Organizations: No    Attends Engineer, structural: 1 to 4 times per year    Marital Status: Married     Family History: The patient's family history includes Cancer in his mother; Lung cancer in his  father; Rheum arthritis in his mother; Stroke in his brother.  ROS:   Please see the history of present illness.     All other systems reviewed and are negative.  EKGs/Labs/Other Studies Reviewed:    The following studies were reviewed today:       Recent Labs: 02/16/2024: ALT 19; Magnesium 1.5 02/17/2024: TSH 3.041 02/18/2024: BUN 12; Creatinine, Ser 0.80; Hemoglobin 9.5; Platelets 241; Potassium 3.7; Sodium 136  Recent Lipid Panel    Component Value Date/Time   CHOL 98 02/17/2024 0405   TRIG 61 02/17/2024 0405   HDL 36 (L) 02/17/2024 0405   CHOLHDL 2.7 02/17/2024 0405   VLDL 12 02/17/2024 0405   LDLCALC 50 02/17/2024 0405     Risk Assessment/Calculations:       Physical Exam:     Physical Exam: Blood pressure 118/74, pulse 74, height 5\' 9"  (1.753 m), weight 227 lb 3.2 oz (103.1 kg), SpO2 97%.       GEN:  Well nourished, well developed in no acute distress HEENT: Normal NECK: No JVD; No carotid bruits LYMPHATICS: No lymphadenopathy CARDIAC:  RRR , no murmurs, rubs, gallops RESPIRATORY:  Clear to auscultation without rales, wheezing or rhonchi  ABDOMEN: Soft, non-tender, non-distended MUSCULOSKELETAL:  No edema; No deformity  SKIN: Warm and dry NEUROLOGIC:  Alert and oriented x 3     ASSESSMENT:    1. Atrial fibrillation with RVR (HCC)   2. Coronary artery disease involving native coronary artery of native heart without angina pectoris   3. Mixed hyperlipidemia       PLAN:       CAD:        .  When I last saw me he was having episodes of angina.  We sent him back for relook heart catheterization.  Dr. Abel Hoe was able to stent an obtuse marginal.  He is feeling quite a bit better following his most recent stent.  He will continue aspirin, Plavix, Eliquis for 1 month following the most recent stent.  After 1 month he may discontinue the aspirin and continue Plavix and Eliquis.  He knows that he needs to restart his 1 month time or following his most  recent stent and not the initial stent several weeks prior.   3.  Hyperlipidemia:    His lipids are at goal.  Continue current medications.    Medication Adjustments/Labs and Tests Ordered: Current medicines are reviewed at length with the patient today.  Concerns regarding medicines are outlined above.  No orders of the defined types were placed in this encounter.  No orders of the defined types were placed in this encounter.   Patient Instructions  Medication Instructions:  Your physician recommends that you continue on your current medications as directed. Please refer to the Current Medication list given to you today.  *If you need a refill on your cardiac medications before your next appointment, please call your pharmacy*  Follow-Up: At [facility], you and your health needs are our priority.  As part of our continuing mission to provide you with exceptional heart care, we have created designated Provider Care Teams.  These Care Teams include your primary Cardiologist (physician) and Advanced Practice Providers (APPs -  Physician Assistants and Nurse Practitioners) who all work together to provide you with the care you need, when you need it.  Your next appointment:   6 month(s)  The format for your next appointment:   In Person  Provider:   Ahmad Alert, MD  or Lovette Rud, PA-C, Dayna Dunn, PA-C, Charles Connor, NP, Theotis Flake, PA-C, Marlyse Single, PA-C, or Leala Prince, PA-C      Other Instructions   1st Floor: - Lobby - Registration  - Pharmacy  - Lab - Cafe  2nd Floor: - PV Lab - Diagnostic Testing (echo, CT, nuclear med)  3rd Floor: - Vacant  4th Floor: - TCTS (cardiothoracic surgery) - AFib Clinic - Structural Heart Clinic - Vascular Surgery  - Vascular Ultrasound  5th Floor: - [facility] Cardiology (general and EP) - Clinical Pharmacy for coumadin, hypertension, lipid, weight-loss medications, and med management appointments    Valet  parking services will be available as well.     Signed, Ahmad Alert, MD  03/06/2024 2:35 PM    [facility]

## 2024-03-06 ENCOUNTER — Ambulatory Visit: Attending: Cardiovascular Disease | Admitting: Cardiovascular Disease

## 2024-03-06 ENCOUNTER — Encounter: Payer: Self-pay | Admitting: Cardiovascular Disease

## 2024-03-06 VITALS — BP 118/74 | HR 74 | Ht 69.0 in | Wt 227.2 lb

## 2024-03-06 DIAGNOSIS — E782 Mixed hyperlipidemia: Secondary | ICD-10-CM | POA: Diagnosis not present

## 2024-03-06 DIAGNOSIS — E1169 Type 2 diabetes mellitus with other specified complication: Secondary | ICD-10-CM | POA: Diagnosis not present

## 2024-03-06 DIAGNOSIS — I4891 Unspecified atrial fibrillation: Secondary | ICD-10-CM | POA: Diagnosis not present

## 2024-03-06 DIAGNOSIS — I2510 Atherosclerotic heart disease of native coronary artery without angina pectoris: Secondary | ICD-10-CM | POA: Diagnosis not present

## 2024-03-06 DIAGNOSIS — Z6833 Body mass index (BMI) 33.0-33.9, adult: Secondary | ICD-10-CM | POA: Diagnosis not present

## 2024-03-06 DIAGNOSIS — I2583 Coronary atherosclerosis due to lipid rich plaque: Secondary | ICD-10-CM | POA: Diagnosis not present

## 2024-03-06 NOTE — Patient Instructions (Signed)
 Medication Instructions:  Your physician recommends that you continue on your current medications as directed. Please refer to the Current Medication list given to you today.  *If you need a refill on your cardiac medications before your next appointment, please call your pharmacy*  Follow-Up: At [facility], you and your health needs are our priority.  As part of our continuing mission to provide you with exceptional heart care, we have created designated Provider Care Teams.  These Care Teams include your primary Cardiologist (physician) and Advanced Practice Providers (APPs -  Physician Assistants and Nurse Practitioners) who all work together to provide you with the care you need, when you need it.  Your next appointment:   6 month(s)  The format for your next appointment:   In Person  Provider:   Ahmad Alert, MD  or Lovette Rud, PA-C, Dayna Dunn, PA-C, Charles Connor, NP, Theotis Flake, PA-C, Marlyse Single, PA-C, or Leala Prince, PA-C      Other Instructions   1st Floor: - Lobby - Registration  - Pharmacy  - Lab - Cafe  2nd Floor: - PV Lab - Diagnostic Testing (echo, CT, nuclear med)  3rd Floor: - Vacant  4th Floor: - TCTS (cardiothoracic surgery) - AFib Clinic - Structural Heart Clinic - Vascular Surgery  - Vascular Ultrasound  5th Floor: - [facility] Cardiology (general and EP) - Clinical Pharmacy for coumadin, hypertension, lipid, weight-loss medications, and med management appointments    Valet parking services will be available as well.

## 2024-03-14 ENCOUNTER — Encounter (HOSPITAL_COMMUNITY)
Admission: RE | Admit: 2024-03-14 | Discharge: 2024-03-14 | Disposition: A | Discharge status: Home / Self Care | Source: Ambulatory Visit | Attending: Cardiovascular Disease | Admitting: Cardiovascular Disease

## 2024-03-14 DIAGNOSIS — Z955 Presence of coronary angioplasty implant and graft: Secondary | ICD-10-CM | POA: Insufficient documentation

## 2024-03-14 DIAGNOSIS — I214 Non-ST elevation (NSTEMI) myocardial infarction: Secondary | ICD-10-CM | POA: Insufficient documentation

## 2024-03-15 ENCOUNTER — Encounter (HOSPITAL_COMMUNITY)
Admission: RE | Admit: 2024-03-15 | Discharge: 2024-03-15 | Discharge status: Home / Self Care | Source: Ambulatory Visit | Attending: Cardiovascular Disease

## 2024-03-15 VITALS — Ht 66.5 in | Wt 230.8 lb

## 2024-03-15 DIAGNOSIS — I214 Non-ST elevation (NSTEMI) myocardial infarction: Secondary | ICD-10-CM | POA: Diagnosis not present

## 2024-03-15 DIAGNOSIS — Z955 Presence of coronary angioplasty implant and graft: Secondary | ICD-10-CM | POA: Diagnosis not present

## 2024-03-15 NOTE — Progress Notes (Signed)
 Cardiac Individual Treatment Plan  Patient Details  Name: Kyle Lambert MRN: [id number] Date of Birth: [birth year] Referring Provider:   Flowsheet Row CARDIAC REHAB PHASE II ORIENTATION from 03/15/2024 in [facility] CARDIAC REHABILITATION  Referring Provider Jackquelyn Mass MD       Initial Encounter Date:  Flowsheet Row CARDIAC REHAB PHASE II ORIENTATION from 03/15/2024 in [place] CARDIAC REHABILITATION  Date 03/15/24       Visit Diagnosis: NSTEMI (non-ST elevated myocardial infarction) [facility])  Status post coronary artery stent placement  Patient's Home Medications on Admission:  Current Outpatient Medications:    amLODipine  (NORVASC ) 5 MG tablet, TAKE 1 TABLET DAILY, Disp: 90 tablet, Rfl: 0   apixaban  (ELIQUIS ) 5 MG TABS tablet, Take 1 tablet (5 mg total) by mouth 2 (two) times daily., Disp: 60 tablet, Rfl: 12   aspirin  EC 81 MG tablet, Take 81 mg by mouth daily., Disp: , Rfl:    atorvastatin  (LIPITOR ) 40 MG tablet, Take 40 mg by mouth at bedtime. , Disp: , Rfl:    clopidogrel  (PLAVIX ) 75 MG tablet, Take 1 tablet (75 mg total) by mouth daily., Disp: 90 tablet, Rfl: 3   cyclobenzaprine  (FLEXERIL ) 10 MG tablet, Take 10 mg by mouth daily as needed for muscle spasms., Disp: , Rfl:    Ferrous Sulfate  (IRON ) 325 (65 Fe) MG TABS, Take 1 tablet (325 mg total) by mouth 2 (two) times daily., Disp: 60 tablet, Rfl: 1   isosorbide  mononitrate (IMDUR ) 120 MG 24 hr tablet, Take 1 tablet (120 mg total) by mouth daily., Disp: 90 tablet, Rfl: 3   meclizine (ANTIVERT) 25 MG tablet, Take 25 mg by mouth 3 (three) times daily as needed for dizziness., Disp: , Rfl:    metFORMIN (GLUCOPHAGE) 1000 MG tablet, Take 1,000 mg by mouth 2 (two) times daily., Disp: , Rfl:    metoprolol  tartrate (LOPRESSOR ) 25 MG tablet, TAKE 1 TABLET TWICE A DAY (Patient taking differently: Take 25 mg by mouth 2 (two) times daily.), Disp: 60 tablet, Rfl: 0   Multiple Vitamins-Minerals (PRESERVISION AREDS 2 PO), Take 1 tablet by  mouth in the morning and at bedtime., Disp: , Rfl:    nitroGLYCERIN  (NITROSTAT ) 0.4 MG SL tablet, Place 1 tablet (0.4 mg total) under the tongue every 5 (five) minutes x 3 doses as needed for chest pain., Disp: 25 tablet, Rfl: 3   pantoprazole  (PROTONIX ) 40 MG tablet, TAKE 1 TABLET BY MOUTH ONCE DAILY --  NEEDS  APPOINTMENT  FOR  FURTHER  REFILLS (Patient taking differently: Take 40 mg by mouth daily.), Disp: 15 tablet, Rfl: 0   silodosin  (RAPAFLO ) 8 MG CAPS capsule, Take 8 mg by mouth daily with breakfast., Disp: , Rfl:   Past Medical History: Past Medical History:  Diagnosis Date   Angina pectoris (HCC)    Arthritis    Bladder cancer (HCC) 03/2020   Bladder carcinoma (HCC)    BMI 35.0-35.9,adult    CAD (coronary artery disease)    a. 03/2012 Cath: LAD 80, RCA 80, nl EF;  b. PCI of RCA w 3.0x65mm Promus DES, FFR of LAD was nl @ 0.85 ->Med Rx.;  c. LHC 02/15/13: proximal LAD 40-50%, mid LAD 95%, small D1 95-99%, mid CFX 30-40%, proximal RCA stent patent with ostial 20-30% prior to the stent, ostial PDA 50%, EF 55-65%. PCI:  Promus DES to the mid LAD and POBA to the proximal D1    Chest pain    COVID    DM (diabetes mellitus) (HCC)  Elevated TSH    Headache    History of angina    Hypercholesterolemia    Hyperlipidemia    Hypertension    Hypotension    Kidney stone    Knee pain    Low back pain    Morbid obesity (HCC)    OA (osteoarthritis)    Obesity    OSA (obstructive sleep apnea)    Sepsis (HCC)    Skin lesion    Tension headache    Tinnitus, bilateral    Umbilical hernia    UTI (urinary tract infection)     Tobacco Use: Social History   Tobacco Use  Smoking Status Never  Smokeless Tobacco Never    Labs: Review Flowsheet  More data exists      Latest Ref Rng & Units 09/03/2017 02/01/2020 04/03/2020 03/26/2023 02/17/2024  Labs for ITP Cardiac and Pulmonary Rehab  Cholestrol 0 - 200 mg/dL - - - - 98   LDL (calc) 0 - 99 mg/dL - - - - 50   HDL-C >16 mg/dL - - - - 36    Trlycerides <150 mg/dL - - - - 61   Hemoglobin A1c 4.8 - 5.6 % - 6.3  6.5  6.4  6.4   TCO2 22 - 32 mmol/L 21  - - - -    Capillary Blood Glucose: Lab Results  Component Value Date   GLUCAP 118 (H) 02/23/2024   GLUCAP 191 (H) 02/18/2024   GLUCAP 105 (H) 02/18/2024   GLUCAP 141 (H) 02/17/2024   GLUCAP 123 (H) 02/17/2024     Exercise Target Goals: Exercise Program Goal: Individual exercise prescription set using results from initial 6 min walk test and THRR while considering  patient's activity barriers and safety.   Exercise Prescription Goal: Starting with aerobic activity 30 plus minutes a day, 3 days per week for initial exercise prescription. Provide home exercise prescription and guidelines that participant acknowledges understanding prior to discharge.  Activity Barriers & Risk Stratification:  Activity Barriers & Cardiac Risk Stratification - 03/14/24 1119       Activity Barriers & Cardiac Risk Stratification   Activity Barriers Shortness of Breath;Arthritis;Back Problems;Balance Concerns;History of Falls;Muscular Weakness;Deconditioning;Joint Problems    Cardiac Risk Stratification Moderate             6 Minute Walk:  6 Minute Walk     Row Name 03/15/24 1451         6 Minute Walk   Phase Initial     Distance 950 feet     Walk Time 6 minutes     # of Rest Breaks 0     MPH 1.8     METS 1.32     RPE 12     Perceived Dyspnea  0     VO2 Peak 4.36     Symptoms No     Resting HR 66 bpm     Resting BP 100/64     Resting Oxygen  Saturation  97 %     Exercise Oxygen  Saturation  during 6 min walk 96 %     Max Ex. HR 79 bpm     Max Ex. BP 120/64     2 Minute Post BP 112/64              Oxygen  Initial Assessment:   Oxygen  Re-Evaluation:   Oxygen  Discharge (Final Oxygen  Re-Evaluation):   Initial Exercise Prescription:  Initial Exercise Prescription - 03/15/24 1400       Date of Initial  Exercise RX and Referring Provider   Date 03/15/24     Referring Provider Jackquelyn Mass MD      Treadmill   MPH 1.5    Grade 0    Minutes 15    METs 2.15      NuStep   Level 1    SPM 60    Minutes 15    METs 1.9      Prescription Details   Frequency (times per week) 3    Duration Progress to 30 minutes of continuous aerobic without signs/symptoms of physical distress      Intensity   THRR 40-80% of Max Heartrate 98-131    Ratings of Perceived Exertion 11-13    Perceived Dyspnea 0-4      Resistance Training   Training Prescription Yes    Weight 4    Reps 10-15             Perform Capillary Blood Glucose checks as needed.  Exercise Prescription Changes:   Exercise Comments:   Exercise Goals and Review:   Exercise Goals     Row Name 03/15/24 1457             Exercise Goals   Increase Physical Activity Yes       Intervention Provide advice, education, support and counseling about physical activity/exercise needs.;Develop an individualized exercise prescription for aerobic and resistive training based on initial evaluation findings, risk stratification, comorbidities and participant's personal goals.       Expected Outcomes Short Term: Attend rehab on a regular basis to increase amount of physical activity.;Long Term: Add in home exercise to make exercise part of routine and to increase amount of physical activity.;Long Term: Exercising regularly at least 3-5 days a week.       Increase Strength and Stamina Yes       Intervention Develop an individualized exercise prescription for aerobic and resistive training based on initial evaluation findings, risk stratification, comorbidities and participant's personal goals.;Provide advice, education, support and counseling about physical activity/exercise needs.       Expected Outcomes Short Term: Increase workloads from initial exercise prescription for resistance, speed, and METs.;Short Term: Perform resistance training exercises routinely during rehab and add in  resistance training at home;Long Term: Improve cardiorespiratory fitness, muscular endurance and strength as measured by increased METs and functional capacity ( )       Able to understand and use rate of perceived exertion (RPE) scale Yes       Intervention Provide education and explanation on how to use RPE scale       Expected Outcomes Short Term: Able to use RPE daily in rehab to express subjective intensity level;Long Term:  Able to use RPE to guide intensity level when exercising independently       Able to understand and use Dyspnea scale Yes       Intervention Provide education and explanation on how to use Dyspnea scale       Expected Outcomes Short Term: Able to use Dyspnea scale daily in rehab to express subjective sense of shortness of breath during exertion;Long Term: Able to use Dyspnea scale to guide intensity level when exercising independently       Knowledge and understanding of Target Heart Rate Range (THRR) Yes       Intervention Provide education and explanation of THRR including how the numbers were predicted and where they are located for reference       Expected Outcomes Short Term: Able to state/look  up THRR;Long Term: Able to use THRR to govern intensity when exercising independently;Short Term: Able to use daily as guideline for intensity in rehab       Able to check pulse independently Yes       Intervention Provide education and demonstration on how to check pulse in carotid and radial arteries.;Review the importance of being able to check your own pulse for safety during independent exercise       Expected Outcomes Short Term: Able to explain why pulse checking is important during independent exercise;Long Term: Able to check pulse independently and accurately       Understanding of Exercise Prescription Yes       Intervention Provide education, explanation, and written materials on patient's individual exercise prescription       Expected Outcomes Short Term: Able to  explain program exercise prescription;Long Term: Able to explain home exercise prescription to exercise independently                Exercise Goals Re-Evaluation :    Discharge Exercise Prescription (Final Exercise Prescription Changes):   Nutrition:  Target Goals: Understanding of nutrition guidelines, daily intake of sodium 1500mg , cholesterol 200mg , calories 30% from fat and 7% or less from saturated fats, daily to have 5 or more servings of fruits and vegetables.  Biometrics:  Pre Biometrics - 03/15/24 1457       Pre Biometrics   Height 5' 6.5" (1.689 m)    Weight 104.7 kg    Waist Circumference 49 inches    Hip Circumference 45 inches    Waist to Hip Ratio 1.09 %    BMI (Calculated) 36.7    Grip Strength 30 kg              Nutrition Therapy Plan and Nutrition Goals:  Nutrition Therapy & Goals - 03/14/24 1133       Intervention Plan   Intervention Prescribe, educate and counsel regarding individualized specific dietary modifications aiming towards targeted core components such as weight, hypertension, lipid management, diabetes, heart failure and other comorbidities.;Nutrition handout(s) given to patient.    Expected Outcomes Short Term Goal: Understand basic principles of dietary content, such as calories, fat, sodium, cholesterol and nutrients.;Long Term Goal: Adherence to prescribed nutrition plan.             Nutrition Assessments:  MEDIFICTS Score Key: >=70 Need to make dietary changes  40-70 Heart Healthy Diet <= 40 Therapeutic Level Cholesterol Diet  Flowsheet Row CARDIAC REHAB PHASE II ORIENTATION from 03/15/2024 in [facility] CARDIAC REHABILITATION  Picture Your Plate Total Score on Admission 58      Picture Your Plate Scores: <16 Unhealthy dietary pattern with much room for improvement. 41-50 Dietary pattern unlikely to meet recommendations for good health and room for improvement. 51-60 More healthful dietary pattern, with some room  for improvement.  >60 Healthy dietary pattern, although there may be some specific behaviors that could be improved.    Nutrition Goals Re-Evaluation:   Nutrition Goals Discharge (Final Nutrition Goals Re-Evaluation):   Psychosocial: Target Goals: Acknowledge presence or absence of significant depression and/or stress, maximize coping skills, provide positive support system. Participant is able to verbalize types and ability to use techniques and skills needed for reducing stress and depression.  Initial Review & Psychosocial Screening:  Initial Psych Review & Screening - 03/14/24 1113       Initial Review   Current issues with Current Stress Concerns    Source of Stress Concerns Chronic Illness;Family  Comments Pt's wife has had multiple surgeries on her toes/feet the last few years, and the pt's daughter has terminal cancer.      Family Dynamics   Good Support System? Yes    Comments Pt's wife and children are his main support system.      Barriers   Psychosocial barriers to participate in program There are no identifiable barriers or psychosocial needs.;The patient should benefit from training in stress management and relaxation.      Screening Interventions   Interventions Encouraged to exercise;To provide support and resources with identified psychosocial needs;Provide feedback about the scores to participant    Expected Outcomes Long Term Goal: Stressors or current issues are controlled or eliminated.;Short Term goal: Utilizing psychosocial counselor, staff and physician to assist with identification of specific Stressors or current issues interfering with healing process. Setting desired goal for each stressor or current issue identified.;Short Term goal: Identification and review with participant of any Quality of Life or Depression concerns found by scoring the questionnaire.;Long Term goal: The participant improves quality of Life and PHQ9 Scores as seen by post scores  and/or verbalization of changes             Quality of Life Scores:  Scores of 19 and below usually indicate a poorer quality of life in these areas.  A difference of  2-3 points is a clinically meaningful difference.  A difference of 2-3 points in the total score of the Quality of Life Index has been associated with significant improvement in overall quality of life, self-image, physical symptoms, and general health in studies assessing change in quality of life.  PHQ-9: Review Flowsheet       03/15/2024 03/15/2013 06/01/2012  Depression screen PHQ 2/9  Decreased Interest 3 0 0  Down, Depressed, Hopeless 3 0 1  PHQ - 2 Score 6 0 1  Altered sleeping 1 - -  Tired, decreased energy 3 - -  Change in appetite 1 - -  Feeling bad or failure about yourself  1 - -  Trouble concentrating 0 - -  Moving slowly or fidgety/restless 2 - -  Suicidal thoughts 0 - -  PHQ-9 Score 14 - -  Difficult doing work/chores Very difficult - -   Interpretation of Total Score  Total Score Depression Severity:  1-4 = Minimal depression, 5-9 = Mild depression, 10-14 = Moderate depression, 15-19 = Moderately severe depression, 20-27 = Severe depression   Psychosocial Evaluation and Intervention:  Psychosocial Evaluation - 03/15/24 1500       Psychosocial Evaluation & Interventions   Interventions Stress management education;Relaxation education;Encouraged to exercise with the program and follow exercise prescription    Comments Patient scored 13 on his initial PHQ-9. He attributes his score to his daughter being diagnosed with terminal cancer. He continues to deny any depression or anxiety. We will continue to monitor.    Expected Outcomes Pt will meet both his personal and program goals, and he will continue to have no identifiable psychosocial barriers.    Continue Psychosocial Services  Follow up required by staff             Psychosocial Re-Evaluation:   Psychosocial Discharge (Final  Psychosocial Re-Evaluation):   Vocational Rehabilitation: Provide vocational rehab assistance to qualifying candidates.   Vocational Rehab Evaluation & Intervention:  Vocational Rehab - 03/14/24 1118       Initial Vocational Rehab Evaluation & Intervention   Assessment shows need for Vocational Rehabilitation No      Vocational  Rehab Re-Evaulation   Comments The pt is retired and has no plans to return to work.             Education: Education Goals: Education classes will be provided on a weekly basis, covering required topics. Participant will state understanding/return demonstration of topics presented.  Learning Barriers/Preferences:  Learning Barriers/Preferences - 03/14/24 1117       Learning Barriers/Preferences   Learning Barriers Exercise Concerns    Learning Preferences Written Material             Education Topics: Hypertension, Hypertension Reduction -Define heart disease and high blood pressure. Discus how high blood pressure affects the body and ways to reduce high blood pressure.   Exercise and Your Heart -Discuss why it is important to exercise, the FITT principles of exercise, normal and abnormal responses to exercise, and how to exercise safely.   Angina -Discuss definition of angina, causes of angina, treatment of angina, and how to decrease risk of having angina.   Cardiac Medications -Review what the following cardiac medications are used for, how they affect the body, and side effects that may occur when taking the medications.  Medications include Aspirin , Beta blockers, calcium  channel blockers, ACE Inhibitors, angiotensin receptor blockers, diuretics, digoxin, and antihyperlipidemics.   Congestive Heart Failure -Discuss the definition of CHF, how to live with CHF, the signs and symptoms of CHF, and how keep track of weight and sodium intake.   Heart Disease and Intimacy -Discus the effect sexual activity has on the heart, how  changes occur during intimacy as we age, and safety during sexual activity.   Smoking Cessation / COPD -Discuss different methods to quit smoking, the health benefits of quitting smoking, and the definition of COPD.   Nutrition I: Fats -Discuss the types of cholesterol, what cholesterol does to the heart, and how cholesterol levels can be controlled.   Nutrition II: Labels -Discuss the different components of food labels and how to read food label   Heart Parts/Heart Disease and PAD -Discuss the anatomy of the heart, the pathway of blood circulation through the heart, and these are affected by heart disease.   Stress I: Signs and Symptoms -Discuss the causes of stress, how stress may lead to anxiety and depression, and ways to limit stress.   Stress II: Relaxation -Discuss different types of relaxation techniques to limit stress.   Warning Signs of Stroke / TIA -Discuss definition of a stroke, what the signs and symptoms are of a stroke, and how to identify when someone is having stroke.   Knowledge Questionnaire Score:  Knowledge Questionnaire Score - 03/15/24 1457       Knowledge Questionnaire Score   Pre Score 22/24             Core Components/Risk Factors/Patient Goals at Admission:  Personal Goals and Risk Factors at Admission - 03/14/24 1121       Core Components/Risk Factors/Patient Goals on Admission    Weight Management Yes    Intervention Weight Management: Provide education and appropriate resources to help participant work on and attain dietary goals.;Weight Management: Develop a combined nutrition and exercise program designed to reach desired caloric intake, while maintaining appropriate intake of nutrient and fiber, sodium and fats, and appropriate energy expenditure required for the weight goal.    Expected Outcomes Long Term: Adherence to nutrition and physical activity/exercise program aimed toward attainment of established weight goal;Short Term:  Continue to assess and modify interventions until short term weight is achieved;Weight  Maintenance: Understanding of the daily nutrition guidelines, which includes 25-35% calories from fat, 7% or less cal from saturated fats, less than 200mg  cholesterol, less than 1.5gm of sodium, & 5 or more servings of fruits and vegetables daily    Improve shortness of breath with ADL's Yes    Intervention Provide education, individualized exercise plan and daily activity instruction to help decrease symptoms of SOB with activities of daily living.    Expected Outcomes Short Term: Improve cardiorespiratory fitness to achieve a reduction of symptoms when performing ADLs;Long Term: Be able to perform more ADLs without symptoms or delay the onset of symptoms    Diabetes Yes    Intervention Provide education about signs/symptoms and action to take for hypo/hyperglycemia.;Provide education about proper nutrition, including hydration, and aerobic/resistive exercise prescription along with prescribed medications to achieve blood glucose in normal ranges: Fasting glucose 65-99 mg/dL    Expected Outcomes Short Term: Participant verbalizes understanding of the signs/symptoms and immediate care of hyper/hypoglycemia, proper foot care and importance of medication, aerobic/resistive exercise and nutrition plan for blood glucose control.;Long Term: Attainment of HbA1C < 7%.    Hypertension Yes    Intervention Provide education on lifestyle modifcations including regular physical activity/exercise, weight management, moderate sodium restriction and increased consumption of fresh fruit, vegetables, and low fat dairy, alcohol moderation, and smoking cessation.;Monitor prescription use compliance.    Expected Outcomes Short Term: Continued assessment and intervention until BP is < 140/42mm HG in hypertensive participants. < 130/72mm HG in hypertensive participants with diabetes, heart failure or chronic kidney disease.;Long Term:  Maintenance of blood pressure at goal levels.    Lipids Yes    Intervention Provide education and support for participant on nutrition & aerobic/resistive exercise along with prescribed medications to achieve LDL 70mg , HDL >40mg .    Expected Outcomes Short Term: Participant states understanding of desired cholesterol values and is compliant with medications prescribed. Participant is following exercise prescription and nutrition guidelines.;Long Term: Cholesterol controlled with medications as prescribed, with individualized exercise RX and with personalized nutrition plan. Value goals: LDL < 70mg , HDL > 40 mg.    Stress Yes    Intervention Refer participants experiencing significant psychosocial distress to appropriate mental health specialists for further evaluation and treatment. When possible, include family members and significant others in education/counseling sessions.;Offer individual and/or small group education and counseling on adjustment to heart disease, stress management and health-related lifestyle change. Teach and support self-help strategies.    Expected Outcomes Short Term: Participant demonstrates changes in health-related behavior, relaxation and other stress management skills, ability to obtain effective social support, and compliance with psychotropic medications if prescribed.;Long Term: Emotional wellbeing is indicated by absence of clinically significant psychosocial distress or social isolation.    Personal Goal Other Yes    Intervention Pt will attend CR 3 days a week.    Expected Outcomes Pt will meet both his personal and program goals.             Core Components/Risk Factors/Patient Goals Review:    Core Components/Risk Factors/Patient Goals at Discharge (Final Review):    ITP Comments:  ITP Comments     Row Name 03/15/24 1523           ITP Comments Patient arrived for 1st visit/orientation/education at 1330. Patient was referred to CR by Dr. Jasmine MesiAndree Kayser due to NSTEMI/Status post stent placement. . During orientation advised patient on arrival and appointment times what to wear, what to do before, during and after exercise. Reviewed  attendance and class policy.  Pt is scheduled to return Cardiac Rehab on 03/19/24 at 1100. Pt was advised to come to class 15 minutes before class starts.  Discussed RPE/Dpysnea scales. Patient participated in warm up stretches. Patient was able to complete 6 minute walk test.  Telemetry:NSR. Patient was measured for the equipment. Discussed equipment safety with patient. Took patient pre-anthropometric measurements. Patient finished visit at 1430.                Comments: Patient arrived for 1st visit/orientation/education at 1330. Patient was referred to CR by Dr. Jasmine MesiAndree Kayser due to NSTEMI/Status post stent placement. . During orientation advised patient on arrival and appointment times what to wear, what to do before, during and after exercise. Reviewed attendance and class policy.  Pt is scheduled to return Cardiac Rehab on 03/19/24 at 1100. Pt was advised to come to class 15 minutes before class starts.  Discussed RPE/Dpysnea scales. Patient participated in warm up stretches. Patient was able to complete 6 minute walk test.  Telemetry:NSR. Patient was measured for the equipment. Discussed equipment safety with patient. Took patient pre-anthropometric measurements. Patient finished visit at 1430.

## 2024-03-15 NOTE — Patient Instructions (Signed)
 Patient Instructions  Patient Details  Name: Kyle Lambert MRN: [id number] Date of Birth: 02/25/1951 Referring Provider:  O'Neal, Cathay Clonts, *  Below are your personal goals for exercise, nutrition, and risk factors. Our goal is to help you stay on track towards obtaining and maintaining these goals. We will be discussing your progress on these goals with you throughout the program.  Initial Exercise Prescription:  Initial Exercise Prescription - 03/15/24 1400       Date of Initial Exercise RX and Referring Provider   Date 03/15/24    Referring Provider Jackquelyn Mass MD      Treadmill   MPH 1.5    Grade 0    Minutes 15    METs 2.15      NuStep   Level 1    SPM 60    Minutes 15    METs 1.9      Prescription Details   Frequency (times per week) 3    Duration Progress to 30 minutes of continuous aerobic without signs/symptoms of physical distress      Intensity   THRR 40-80% of Max Heartrate 98-131    Ratings of Perceived Exertion 11-13    Perceived Dyspnea 0-4      Resistance Training   Training Prescription Yes    Weight 4    Reps 10-15             Exercise Goals: Frequency: Be able to perform aerobic exercise two to three times per week in program working toward 2-5 days per week of home exercise.  Intensity: Work with a perceived exertion of 11 (fairly light) - 15 (hard) while following your exercise prescription.  We will make changes to your prescription with you as you progress through the program.   Duration: Be able to do 30 to 45 minutes of continuous aerobic exercise in addition to a 5 minute warm-up and a 5 minute cool-down routine.   Nutrition Goals: Your personal nutrition goals will be established when you do your nutrition analysis with the dietician.  The following are general nutrition guidelines to follow: Cholesterol < 200mg /day Sodium < 1500mg /day Fiber: Men over 50 yrs - 30 grams per day  Personal Goals:  Personal Goals and Risk  Factors at Admission - 03/14/24 1121       Core Components/Risk Factors/Patient Goals on Admission    Weight Management Yes    Intervention Weight Management: Provide education and appropriate resources to help participant work on and attain dietary goals.;Weight Management: Develop a combined nutrition and exercise program designed to reach desired caloric intake, while maintaining appropriate intake of nutrient and fiber, sodium and fats, and appropriate energy expenditure required for the weight goal.    Expected Outcomes Long Term: Adherence to nutrition and physical activity/exercise program aimed toward attainment of established weight goal;Short Term: Continue to assess and modify interventions until short term weight is achieved;Weight Maintenance: Understanding of the daily nutrition guidelines, which includes 25-35% calories from fat, 7% or less cal from saturated fats, less than 200mg  cholesterol, less than 1.5gm of sodium, & 5 or more servings of fruits and vegetables daily    Improve shortness of breath with ADL's Yes    Intervention Provide education, individualized exercise plan and daily activity instruction to help decrease symptoms of SOB with activities of daily living.    Expected Outcomes Short Term: Improve cardiorespiratory fitness to achieve a reduction of symptoms when performing ADLs;Long Term: Be able to perform more ADLs without symptoms or  delay the onset of symptoms    Diabetes Yes    Intervention Provide education about signs/symptoms and action to take for hypo/hyperglycemia.;Provide education about proper nutrition, including hydration, and aerobic/resistive exercise prescription along with prescribed medications to achieve blood glucose in normal ranges: Fasting glucose 65-99 mg/dL    Expected Outcomes Short Term: Participant verbalizes understanding of the signs/symptoms and immediate care of hyper/hypoglycemia, proper foot care and importance of medication,  aerobic/resistive exercise and nutrition plan for blood glucose control.;Long Term: Attainment of HbA1C < 7%.    Hypertension Yes    Intervention Provide education on lifestyle modifcations including regular physical activity/exercise, weight management, moderate sodium restriction and increased consumption of fresh fruit, vegetables, and low fat dairy, alcohol moderation, and smoking cessation.;Monitor prescription use compliance.    Expected Outcomes Short Term: Continued assessment and intervention until BP is < 140/59mm HG in hypertensive participants. < 130/75mm HG in hypertensive participants with diabetes, heart failure or chronic kidney disease.;Long Term: Maintenance of blood pressure at goal levels.    Lipids Yes    Intervention Provide education and support for participant on nutrition & aerobic/resistive exercise along with prescribed medications to achieve LDL 70mg , HDL >40mg .    Expected Outcomes Short Term: Participant states understanding of desired cholesterol values and is compliant with medications prescribed. Participant is following exercise prescription and nutrition guidelines.;Long Term: Cholesterol controlled with medications as prescribed, with individualized exercise RX and with personalized nutrition plan. Value goals: LDL < 70mg , HDL > 40 mg.    Stress Yes    Intervention Refer participants experiencing significant psychosocial distress to appropriate mental health specialists for further evaluation and treatment. When possible, include family members and significant others in education/counseling sessions.;Offer individual and/or small group education and counseling on adjustment to heart disease, stress management and health-related lifestyle change. Teach and support self-help strategies.    Expected Outcomes Short Term: Participant demonstrates changes in health-related behavior, relaxation and other stress management skills, ability to obtain effective social support, and  compliance with psychotropic medications if prescribed.;Long Term: Emotional wellbeing is indicated by absence of clinically significant psychosocial distress or social isolation.    Personal Goal Other Yes    Intervention Pt will attend CR 3 days a week.    Expected Outcomes Pt will meet both his personal and program goals.             Tobacco Use Initial Evaluation: Social History   Tobacco Use  Smoking Status Never  Smokeless Tobacco Never    Exercise Goals and Review:  Exercise Goals     Row Name 03/15/24 1457             Exercise Goals   Increase Physical Activity Yes       Intervention Provide advice, education, support and counseling about physical activity/exercise needs.;Develop an individualized exercise prescription for aerobic and resistive training based on initial evaluation findings, risk stratification, comorbidities and participant's personal goals.       Expected Outcomes Short Term: Attend rehab on a regular basis to increase amount of physical activity.;Long Term: Add in home exercise to make exercise part of routine and to increase amount of physical activity.;Long Term: Exercising regularly at least 3-5 days a week.       Increase Strength and Stamina Yes       Intervention Develop an individualized exercise prescription for aerobic and resistive training based on initial evaluation findings, risk stratification, comorbidities and participant's personal goals.;Provide advice, education, support and counseling about physical activity/exercise  needs.       Expected Outcomes Short Term: Increase workloads from initial exercise prescription for resistance, speed, and METs.;Short Term: Perform resistance training exercises routinely during rehab and add in resistance training at home;Long Term: Improve cardiorespiratory fitness, muscular endurance and strength as measured by increased METs and functional capacity ( )       Able to understand and use rate of  perceived exertion (RPE) scale Yes       Intervention Provide education and explanation on how to use RPE scale       Expected Outcomes Short Term: Able to use RPE daily in rehab to express subjective intensity level;Long Term:  Able to use RPE to guide intensity level when exercising independently       Able to understand and use Dyspnea scale Yes       Intervention Provide education and explanation on how to use Dyspnea scale       Expected Outcomes Short Term: Able to use Dyspnea scale daily in rehab to express subjective sense of shortness of breath during exertion;Long Term: Able to use Dyspnea scale to guide intensity level when exercising independently       Knowledge and understanding of Target Heart Rate Range (THRR) Yes       Intervention Provide education and explanation of THRR including how the numbers were predicted and where they are located for reference       Expected Outcomes Short Term: Able to state/look up THRR;Long Term: Able to use THRR to govern intensity when exercising independently;Short Term: Able to use daily as guideline for intensity in rehab       Able to check pulse independently Yes       Intervention Provide education and demonstration on how to check pulse in carotid and radial arteries.;Review the importance of being able to check your own pulse for safety during independent exercise       Expected Outcomes Short Term: Able to explain why pulse checking is important during independent exercise;Long Term: Able to check pulse independently and accurately       Understanding of Exercise Prescription Yes       Intervention Provide education, explanation, and written materials on patient's individual exercise prescription       Expected Outcomes Short Term: Able to explain program exercise prescription;Long Term: Able to explain home exercise prescription to exercise independently                Copy of goals given to participant.

## 2024-03-19 ENCOUNTER — Encounter (HOSPITAL_COMMUNITY)
Admission: RE | Admit: 2024-03-19 | Discharge: 2024-03-19 | Disposition: A | Discharge status: Home / Self Care | Source: Ambulatory Visit | Attending: Cardiovascular Disease | Admitting: Cardiovascular Disease

## 2024-03-19 DIAGNOSIS — E782 Mixed hyperlipidemia: Secondary | ICD-10-CM | POA: Diagnosis not present

## 2024-03-19 DIAGNOSIS — I214 Non-ST elevation (NSTEMI) myocardial infarction: Secondary | ICD-10-CM

## 2024-03-19 DIAGNOSIS — I2510 Atherosclerotic heart disease of native coronary artery without angina pectoris: Secondary | ICD-10-CM | POA: Diagnosis not present

## 2024-03-19 DIAGNOSIS — E1169 Type 2 diabetes mellitus with other specified complication: Secondary | ICD-10-CM | POA: Diagnosis not present

## 2024-03-19 DIAGNOSIS — I700 Atherosclerosis of aorta: Secondary | ICD-10-CM | POA: Diagnosis not present

## 2024-03-19 DIAGNOSIS — Z955 Presence of coronary angioplasty implant and graft: Secondary | ICD-10-CM

## 2024-03-19 LAB — GLUCOSE, CAPILLARY: Glucose-Capillary: 108 mg/dL — ABNORMAL HIGH (ref 70–99)

## 2024-03-19 NOTE — Progress Notes (Signed)
 Daily Session Note  Patient Details  Name: Kyle Lambert MRN: [id number] Date of Birth: [birth year] Referring Provider:   Flowsheet Row CARDIAC REHAB PHASE II ORIENTATION from 03/15/2024 in [facility] CARDIAC REHABILITATION  Referring Provider Jackquelyn Mass MD       Encounter Date: 03/19/2024  Check In:  Session Check In - 03/19/24 1100       Check-In   Supervising physician immediately available to respond to emergencies See telemetry face sheet for immediately available MD    Location [facility]    Staff Present Clotilda Danish, BS, Exercise Physiologist;Brittany Foley, BSN, RN;Laureen Bevin Bucks, BS, RRT, CPFT    Virtual Visit No    Medication changes reported     No    Fall or balance concerns reported    No    Tobacco Cessation No Change    Warm-up and Cool-down Performed on first and last piece of equipment    Resistance Training Performed Yes    VAD Patient? No    PAD/SET Patient? No      Pain Assessment   Currently in Pain? No/denies    Pain Score 0-No pain    Multiple Pain Sites No             Capillary Blood Glucose: Results for orders placed or performed during the hospital encounter of 03/19/24 (from the past 24 hours)  Glucose, capillary     Status: Abnormal   Collection Time: 03/19/24 10:56 AM  Result Value Ref Range   Glucose-Capillary 108 (H) 70 - 99 mg/dL      Social History   Tobacco Use  Smoking Status Never  Smokeless Tobacco Never    Goals Met:  Independence with exercise equipment Exercise tolerated well No report of concerns or symptoms today Strength training completed today  Goals Unmet:  Not Applicable  Comments:  First full day of exercise!  Patient was oriented to gym and equipment including functions, settings, policies, and procedures.  Patient's individual exercise prescription and treatment plan were reviewed.  All starting workloads were established based on the results of the 6 minute walk test done at  initial orientation visit.  The plan for exercise progression was also introduced and progression will be customized based on patient's performance and goals.

## 2024-03-21 ENCOUNTER — Encounter (HOSPITAL_COMMUNITY)
Admission: RE | Admit: 2024-03-21 | Discharge: 2024-03-21 | Disposition: A | Discharge status: Home / Self Care | Source: Ambulatory Visit | Attending: Cardiovascular Disease

## 2024-03-21 DIAGNOSIS — I214 Non-ST elevation (NSTEMI) myocardial infarction: Secondary | ICD-10-CM

## 2024-03-21 DIAGNOSIS — Z955 Presence of coronary angioplasty implant and graft: Secondary | ICD-10-CM

## 2024-03-21 NOTE — Progress Notes (Signed)
 Daily Session Note  Patient Details  Name: AMAL PALMATIER MRN: [id number] Date of Birth: 28-May-1951 Referring Provider:   Flowsheet Row CARDIAC REHAB PHASE II ORIENTATION from 03/15/2024 in [facility] CARDIAC REHABILITATION  Referring Provider Jackquelyn Mass MD       Encounter Date: 03/21/2024  Check In:  Session Check In - 03/21/24 1020       Check-In   Supervising physician immediately available to respond to emergencies See telemetry face sheet for immediately available MD    Location [facility]    Staff Present Rita Cherry, MA, RCEP, CCRP, CCET;Brooke Rollin Clock, RN;Eileen Croswell Lincoln Renshaw, RN, BSN    Virtual Visit No    Medication changes reported     No    Fall or balance concerns reported    No    Warm-up and Cool-down Performed on first and last piece of equipment    Resistance Training Performed Yes    VAD Patient? No    PAD/SET Patient? No      Pain Assessment   Currently in Pain? No/denies    Pain Score 0-No pain    Multiple Pain Sites No             Capillary Blood Glucose: No results found for this or any previous visit (from the past 24 hours).    Social History   Tobacco Use  Smoking Status Never  Smokeless Tobacco Never    Goals Met:  Independence with exercise equipment Exercise tolerated well No report of concerns or symptoms today Strength training completed today  Goals Unmet:  Not Applicable  Comments: Pt able to follow exercise prescription today without complaint.  Will continue to monitor for progression.

## 2024-03-23 ENCOUNTER — Other Ambulatory Visit (HOSPITAL_COMMUNITY): Payer: Self-pay

## 2024-03-23 ENCOUNTER — Encounter (HOSPITAL_COMMUNITY)
Admission: RE | Admit: 2024-03-23 | Discharge: 2024-03-23 | Disposition: A | Discharge status: Home / Self Care | Source: Ambulatory Visit | Attending: Cardiovascular Disease | Admitting: Cardiovascular Disease

## 2024-03-23 DIAGNOSIS — Z955 Presence of coronary angioplasty implant and graft: Secondary | ICD-10-CM | POA: Diagnosis not present

## 2024-03-23 DIAGNOSIS — I214 Non-ST elevation (NSTEMI) myocardial infarction: Secondary | ICD-10-CM | POA: Insufficient documentation

## 2024-03-23 LAB — GLUCOSE, CAPILLARY: Glucose-Capillary: 111 mg/dL — ABNORMAL HIGH (ref 70–99)

## 2024-03-23 NOTE — Progress Notes (Signed)
 Daily Session Note  Patient Details  Name: Kyle Lambert MRN: [id number] Date of Birth: 03-15-1951 Referring Provider:   Flowsheet Row CARDIAC REHAB PHASE II ORIENTATION from 03/15/2024 in [facility] CARDIAC REHABILITATION  Referring Provider Jackquelyn Mass MD       Encounter Date: 03/23/2024  Check In:  Session Check In - 03/23/24 1045       Check-In   Supervising physician immediately available to respond to emergencies See telemetry face sheet for immediately available MD    Location [facility]    Staff Present Balinda Level, BS, RRT, CPFT;Heather Alec Huntington, Exercise Physiologist;Teleah Villamar Lincoln Renshaw, RN, BSN    Virtual Visit No    Medication changes reported     No    Fall or balance concerns reported    No    Warm-up and Cool-down Performed on first and last piece of equipment    Resistance Training Performed Yes    VAD Patient? No    PAD/SET Patient? No      Pain Assessment   Currently in Pain? No/denies    Pain Score 0-No pain    Multiple Pain Sites No             Capillary Blood Glucose: Results for orders placed or performed during the hospital encounter of 03/23/24 (from the past 24 hours)  Glucose, capillary     Status: Abnormal   Collection Time: 03/23/24 10:51 AM  Result Value Ref Range   Glucose-Capillary 111 (H) 70 - 99 mg/dL      Social History   Tobacco Use  Smoking Status Never  Smokeless Tobacco Never    Goals Met:  Independence with exercise equipment Exercise tolerated well No report of concerns or symptoms today Strength training completed today  Goals Unmet:  Not Applicable  Comments: Pt able to follow exercise prescription today without complaint.  Will continue to monitor for progression.

## 2024-03-26 ENCOUNTER — Encounter (HOSPITAL_COMMUNITY)
Admission: RE | Admit: 2024-03-26 | Discharge: 2024-03-26 | Disposition: A | Discharge status: Home / Self Care | Source: Ambulatory Visit | Attending: Cardiovascular Disease

## 2024-03-26 DIAGNOSIS — I214 Non-ST elevation (NSTEMI) myocardial infarction: Secondary | ICD-10-CM

## 2024-03-26 DIAGNOSIS — Z955 Presence of coronary angioplasty implant and graft: Secondary | ICD-10-CM

## 2024-03-26 NOTE — Progress Notes (Signed)
 Daily Session Note  Patient Details  Name: SPENCER DELORENZO MRN: [id number] Date of Birth: Apr 10, 1951 Referring Provider:   Flowsheet Row CARDIAC REHAB PHASE II ORIENTATION from 03/15/2024 in [facility] CARDIAC REHABILITATION  Referring Provider Jackquelyn Mass MD       Encounter Date: 03/26/2024  Check In:  Session Check In - 03/26/24 1045       Check-In   Supervising physician immediately available to respond to emergencies See telemetry face sheet for immediately available MD    Location [facility]    Staff Present Clotilda Danish, BS, Exercise Physiologist;Dove Gresham Lincoln Renshaw, RN, Adolphus Hoops, MA, RCEP, CCRP, CCET    Virtual Visit No    Medication changes reported     No    Fall or balance concerns reported    No    Warm-up and Cool-down Performed on first and last piece of equipment    Resistance Training Performed Yes    VAD Patient? No    PAD/SET Patient? No      Pain Assessment   Currently in Pain? No/denies    Pain Score 0-No pain    Multiple Pain Sites No             Capillary Blood Glucose: No results found for this or any previous visit (from the past 24 hours).    Social History   Tobacco Use  Smoking Status Never  Smokeless Tobacco Never    Goals Met:  Independence with exercise equipment Exercise tolerated well No report of concerns or symptoms today Strength training completed today  Goals Unmet:  Not Applicable  Comments: Pt able to follow exercise prescription today without complaint.  Will continue to monitor for progression.

## 2024-03-28 ENCOUNTER — Encounter (HOSPITAL_COMMUNITY)
Admission: RE | Admit: 2024-03-28 | Discharge: 2024-03-28 | Disposition: A | Discharge status: Home / Self Care | Source: Ambulatory Visit | Attending: Cardiovascular Disease

## 2024-03-28 DIAGNOSIS — I214 Non-ST elevation (NSTEMI) myocardial infarction: Secondary | ICD-10-CM

## 2024-03-28 DIAGNOSIS — Z955 Presence of coronary angioplasty implant and graft: Secondary | ICD-10-CM

## 2024-03-28 NOTE — Progress Notes (Signed)
 Daily Session Note  Patient Details  Name: Kyle Lambert MRN: [id number] Date of Birth: 26-Aug-1951 Referring Provider:   Flowsheet Row CARDIAC REHAB PHASE II ORIENTATION from 03/15/2024 in [facility] CARDIAC REHABILITATION  Referring Provider Kyle Mass MD       Encounter Date: 03/28/2024  Check In:  Session Check In - 03/28/24 1030       Check-In   Supervising physician immediately available to respond to emergencies See telemetry face sheet for immediately available MD    Location [facility]    Staff Present Ronna Coho BSN, RN;Brittany Annette Barters, BSN, RN, Aggie Horton, MA, RCEP, CCRP, CCET;Heather [place], [place], Exercise Physiologist    Virtual Visit No    Medication changes reported     No    Fall or balance concerns reported    No    Tobacco Cessation No Change    Warm-up and Cool-down Performed on first and last piece of equipment    Resistance Training Performed Yes    VAD Patient? No    PAD/SET Patient? No      Pain Assessment   Currently in Pain? No/denies    Pain Score 0-No pain    Multiple Pain Sites No             Capillary Blood Glucose: No results found for this or any previous visit (from the past 24 hours).    Social History   Tobacco Use  Smoking Status Never  Smokeless Tobacco Never    Goals Met:  Independence with exercise equipment Exercise tolerated well No report of concerns or symptoms today Strength training completed today  Goals Unmet:  Not Applicable  Comments: Kyle AasAaron AasPt able to follow exercise prescription today without complaint.  Will continue to monitor for progression.

## 2024-03-30 ENCOUNTER — Encounter (HOSPITAL_COMMUNITY)
Admission: RE | Admit: 2024-03-30 | Discharge: 2024-03-30 | Disposition: A | Discharge status: Home / Self Care | Source: Ambulatory Visit | Attending: Cardiovascular Disease

## 2024-03-30 DIAGNOSIS — Z955 Presence of coronary angioplasty implant and graft: Secondary | ICD-10-CM

## 2024-03-30 DIAGNOSIS — I214 Non-ST elevation (NSTEMI) myocardial infarction: Secondary | ICD-10-CM

## 2024-03-30 NOTE — Progress Notes (Signed)
 Daily Session Note  Patient Details  Name: ROSSER SHOOK MRN: [id number] Date of Birth: January 26, 1951 Referring Provider:   Flowsheet Row CARDIAC REHAB PHASE II ORIENTATION from 03/15/2024 in [facility] CARDIAC REHABILITATION  Referring Provider Jackquelyn Mass MD       Encounter Date: 03/30/2024  Check In:  Session Check In - 03/30/24 1100       Check-In   Supervising physician immediately available to respond to emergencies See telemetry face sheet for immediately available MD    Location [facility]    Staff Present Clotilda Danish, BS, Exercise Physiologist;Brittany Annette Barters, BSN, RN, WTA-C    Virtual Visit No    Medication changes reported     No    Fall or balance concerns reported    No    Tobacco Cessation No Change    Warm-up and Cool-down Performed on first and last piece of equipment    Resistance Training Performed Yes    VAD Patient? No    PAD/SET Patient? No      Pain Assessment   Currently in Pain? No/denies    Pain Score 0-No pain    Multiple Pain Sites No             Capillary Blood Glucose: No results found for this or any previous visit (from the past 24 hours).    Social History   Tobacco Use  Smoking Status Never  Smokeless Tobacco Never    Goals Met:  Independence with exercise equipment Exercise tolerated well No report of concerns or symptoms today Strength training completed today  Goals Unmet:  Not Applicable  Comments: Pt able to follow exercise prescription today without complaint.  Will continue to monitor for progression.

## 2024-04-02 ENCOUNTER — Encounter (HOSPITAL_COMMUNITY)
Admission: RE | Admit: 2024-04-02 | Discharge: 2024-04-02 | Disposition: A | Discharge status: Home / Self Care | Source: Ambulatory Visit | Attending: Cardiovascular Disease

## 2024-04-02 DIAGNOSIS — Z955 Presence of coronary angioplasty implant and graft: Secondary | ICD-10-CM

## 2024-04-02 DIAGNOSIS — I214 Non-ST elevation (NSTEMI) myocardial infarction: Secondary | ICD-10-CM

## 2024-04-02 NOTE — Progress Notes (Signed)
 Daily Session Note  Patient Details  Name: Kyle Lambert MRN: [id number] Date of Birth: 1951-10-18 Referring Provider:   Flowsheet Row CARDIAC REHAB PHASE II ORIENTATION from 03/15/2024 in [facility] CARDIAC REHABILITATION  Referring Provider Jackquelyn Mass MD       Encounter Date: 04/02/2024  Check In:  Session Check In - 04/02/24 1045       Check-In   Supervising physician immediately available to respond to emergencies See telemetry face sheet for immediately available MD    Location [facility]    Staff Present Jerrol Morelle, BSN, RN, Aggie Horton, MA, RCEP, CCRP, Amador Bad, RN, BSN    Virtual Visit No    Medication changes reported     No    Fall or balance concerns reported    No    Warm-up and Cool-down Performed on first and last piece of equipment    Resistance Training Performed Yes    VAD Patient? No    PAD/SET Patient? No      Pain Assessment   Currently in Pain? No/denies    Pain Score 0-No pain    Multiple Pain Sites No             Capillary Blood Glucose: No results found for this or any previous visit (from the past 24 hours).    Social History   Tobacco Use  Smoking Status Never  Smokeless Tobacco Never    Goals Met:  Independence with exercise equipment Exercise tolerated well No report of concerns or symptoms today Strength training completed today  Goals Unmet:  Not Applicable  Comments: Pt able to follow exercise prescription today without complaint.  Will continue to monitor for progression.

## 2024-04-04 ENCOUNTER — Encounter (HOSPITAL_COMMUNITY)
Admission: RE | Admit: 2024-04-04 | Discharge: 2024-04-04 | Disposition: A | Discharge status: Home / Self Care | Source: Ambulatory Visit | Attending: Cardiovascular Disease | Admitting: Cardiovascular Disease

## 2024-04-04 DIAGNOSIS — I214 Non-ST elevation (NSTEMI) myocardial infarction: Secondary | ICD-10-CM | POA: Diagnosis not present

## 2024-04-04 DIAGNOSIS — Z955 Presence of coronary angioplasty implant and graft: Secondary | ICD-10-CM

## 2024-04-04 NOTE — Progress Notes (Signed)
 Daily Session Note  Patient Details  Name: Kyle Lambert MRN: [id number] Date of Birth: [birth year] Referring Provider:   Flowsheet Row CARDIAC REHAB PHASE II ORIENTATION from 03/15/2024 in [facility] CARDIAC REHABILITATION  Referring Provider Kyle Mass MD       Encounter Date: 04/04/2024  Check In:  Session Check In - 04/04/24 1020       Check-In   Supervising physician immediately available to respond to emergencies See telemetry face sheet for immediately available MD    Location [facility]    Staff Present Ronna Coho BSN, RN;Heather Toy Freund, BS, Exercise Physiologist;Jessica [place], MA, RCEP, CCRP, CCET    Virtual Visit No    Medication changes reported     No    Fall or balance concerns reported    No    Tobacco Cessation No Change    Warm-up and Cool-down Performed on first and last piece of equipment    Resistance Training Performed Yes    VAD Patient? No    PAD/SET Patient? No      Pain Assessment   Currently in Pain? No/denies    Pain Score 0-No pain    Multiple Pain Sites No             Capillary Blood Glucose: No results found for this or any previous visit (from the past 24 hours).    Social History   Tobacco Use  Smoking Status Never  Smokeless Tobacco Never    Goals Met:  Independence with exercise equipment Exercise tolerated well No report of concerns or symptoms today Strength training completed today  Goals Unmet:  Not Applicable  Comments: Kyle AasAaron AasPt able to follow exercise prescription today without complaint.  Will continue to monitor for progression.

## 2024-04-06 ENCOUNTER — Encounter (HOSPITAL_COMMUNITY)
Admission: RE | Admit: 2024-04-06 | Discharge: 2024-04-06 | Disposition: A | Discharge status: Home / Self Care | Source: Ambulatory Visit | Attending: Cardiovascular Disease | Admitting: Cardiovascular Disease

## 2024-04-06 DIAGNOSIS — I214 Non-ST elevation (NSTEMI) myocardial infarction: Secondary | ICD-10-CM | POA: Diagnosis not present

## 2024-04-06 DIAGNOSIS — Z955 Presence of coronary angioplasty implant and graft: Secondary | ICD-10-CM

## 2024-04-06 NOTE — Progress Notes (Signed)
 Daily Session Note  Patient Details  Name: ISSAAC CLAYWELL MRN: [id number] Date of Birth: [birth year] Referring Provider:   Flowsheet Row CARDIAC REHAB PHASE II ORIENTATION from 03/15/2024 in [facility] CARD[facility]AC [facility]IT[facility]  Referring Provider Jackquelyn Mass MD       Encounter Date: 04/06/2024  Check In:  Session Check In - 04/06/24 1100       Check-In   Supervising physician immediately available to respond to emergencies See telemetry face sheet for immediately available MD    Location [facility]    Staff Present Jerrol Morelle, BSN, RN, Aggie Horton, MA, RCEP, CCRP, CCET    Virtual Visit No    Medication changes reported     No    Fall or balance concerns reported    No    Tobacco Cessation No Change    Warm-up and Cool-down Performed on first and last piece of equipment    Resistance Training Performed Yes    VAD Patient? No    PAD/SET Patient? No      Pain Assessment   Currently in Pain? No/denies             Capillary Blood Glucose: No results found for this or any previous visit (from the past 24 hours).    Social History   Tobacco Use  Smoking Status Never  Smokeless Tobacco Never    Goals Met:  Independence with exercise equipment Exercise tolerated well No report of concerns or symptoms today Strength training completed today  Goals Unmet:  Not Applicable  Comments: Pt able to follow exercise prescription today without complaint.  Will continue to monitor for progression.

## 2024-04-09 ENCOUNTER — Encounter (HOSPITAL_COMMUNITY)
Admission: RE | Admit: 2024-04-09 | Discharge: 2024-04-09 | Disposition: A | Discharge status: Home / Self Care | Source: Ambulatory Visit | Attending: Cardiovascular Disease | Admitting: Cardiovascular Disease

## 2024-04-09 DIAGNOSIS — I214 Non-ST elevation (NSTEMI) myocardial infarction: Secondary | ICD-10-CM | POA: Diagnosis not present

## 2024-04-09 DIAGNOSIS — Z955 Presence of coronary angioplasty implant and graft: Secondary | ICD-10-CM

## 2024-04-09 NOTE — Progress Notes (Signed)
 Daily Session Note  Patient Details  Name: Kyle Lambert MRN: [id number] Date of Birth: [birth year] Referring Provider:   Flowsheet Row CARDIAC REHAB PHASE II ORIENTATION from 03/15/2024 in [facility] CARDIAC REHABILIT[facility]  Referring Provider Jackquelyn Mass MD       Encounter Date: 04/09/2024  Check In:  Session Check In - 04/09/24 1045       Check-In   Supervising physician immediately available to respond to emergencies See telemetry face sheet for immediately available MD    Location [facility]    Staff Present Jerrol Morelle, BSN, RN, Aggie Horton, MA, RCEP, CCRP, CCET;Heather Kouts, [place], Exercise Physiologist    Virtual Visit No    Medication changes reported     No    Fall or balance concerns reported    No    Tobacco Cessation No Change    Warm-up and Cool-down Performed on first and last piece of equipment    Resistance Training Performed Yes    VAD Patient? No    PAD/SET Patient? No      Pain Assessment   Currently in Pain? No/denies             Capillary Blood Glucose: No results found for this or any previous visit (from the past 24 hours).    Social History   Tobacco Use  Smoking Status Never  Smokeless Tobacco Never    Goals Met:  Independence with exercise equipment Exercise tolerated well No report of concerns or symptoms today Strength training completed today  Goals Unmet:  Not Applicable  Comments: Pt able to follow exercise prescription today without complaint.  Will continue to monitor for progression.

## 2024-04-11 ENCOUNTER — Encounter (HOSPITAL_COMMUNITY): Payer: Self-pay | Admitting: *Deleted

## 2024-04-11 ENCOUNTER — Encounter (HOSPITAL_COMMUNITY)
Admission: RE | Admit: 2024-04-11 | Discharge: 2024-04-11 | Disposition: A | Discharge status: Home / Self Care | Source: Ambulatory Visit | Attending: Cardiovascular Disease | Admitting: Cardiovascular Disease

## 2024-04-11 DIAGNOSIS — I214 Non-ST elevation (NSTEMI) myocardial infarction: Secondary | ICD-10-CM

## 2024-04-11 DIAGNOSIS — Z955 Presence of coronary angioplasty implant and graft: Secondary | ICD-10-CM

## 2024-04-11 NOTE — Progress Notes (Signed)
 Daily Session Note  Patient Details  Name: Kyle Lambert MRN: [id number] Date of Birth: [birth year] Referring Provider:   Flowsheet Row CARDIAC REHAB PHASE II ORIENTATION from 03/15/2024 in [facility] CARDIAC REHABILITATION  Referring Provider Jackquelyn Mass MD       Encounter Date: 04/11/2024  Check In:  Session Check In - 04/11/24 1041       Check-In   Supervising physician immediately available to respond to emergencies See telemetry face sheet for immediately available MD    Location [facility]    Staff Present Clotilda Danish, BS, Exercise Physiologist;Brooke Rollin Clock, RN    Virtual Visit No    Medication changes reported     No    Fall or balance concerns reported    No    Tobacco Cessation No Change    Warm-up and Cool-down Performed on first and last piece of equipment    Resistance Training Performed Yes    VAD Patient? No    PAD/SET Patient? No      Pain Assessment   Currently in Pain? No/denies    Pain Score 0-No pain    Multiple Pain Sites No             Capillary Blood Glucose: No results found for this or any previous visit (from the past 24 hours).    Social History   Tobacco Use  Smoking Status Never  Smokeless Tobacco Never    Goals Met:  Independence with exercise equipment Exercise tolerated well No report of concerns or symptoms today Strength training completed today  Goals Unmet:  Not Applicable  Comments: Pt able to follow exercise prescription today without complaint.  Will continue to monitor for progression.

## 2024-04-11 NOTE — Progress Notes (Signed)
 Cardiac Individual Treatment Plan  Patient Details  Name: Kyle Lambert MRN: [id number] Date of Birth: 1951-06-18 Referring Provider:   Flowsheet Row CARDIAC REHAB PHASE II ORIENTATION from 03/15/2024 in [facility] CARDIAC REHABILITATION  Referring Provider Jackquelyn Mass MD       Initial Encounter Date:  Flowsheet Row CARDIAC REHAB PHASE II ORIENTATION from 03/15/2024 in [place] CARDIAC REHABILITATION  Date 03/15/24       Visit Diagnosis: NSTEMI (non-ST elevated myocardial infarction) [facility])  Status post coronary artery stent placement  Patient's Home Medications on Admission:  Current Outpatient Medications:    amLODipine  (NORVASC ) 5 MG tablet, TAKE 1 TABLET DAILY, Disp: 90 tablet, Rfl: 0   apixaban  (ELIQUIS ) 5 MG TABS tablet, Take 1 tablet (5 mg total) by mouth 2 (two) times daily., Disp: 60 tablet, Rfl: 12   aspirin  EC 81 MG tablet, Take 81 mg by mouth daily., Disp: , Rfl:    atorvastatin  (LIPITOR ) 40 MG tablet, Take 40 mg by mouth at bedtime. , Disp: , Rfl:    clopidogrel  (PLAVIX ) 75 MG tablet, Take 1 tablet (75 mg total) by mouth daily., Disp: 90 tablet, Rfl: 3   cyclobenzaprine  (FLEXERIL ) 10 MG tablet, Take 10 mg by mouth daily as needed for muscle spasms., Disp: , Rfl:    Ferrous Sulfate  (IRON ) 325 (65 Fe) MG TABS, Take 1 tablet (325 mg total) by mouth 2 (two) times daily., Disp: 60 tablet, Rfl: 1   isosorbide  mononitrate (IMDUR ) 120 MG 24 hr tablet, Take 1 tablet (120 mg total) by mouth daily., Disp: 90 tablet, Rfl: 3   meclizine (ANTIVERT) 25 MG tablet, Take 25 mg by mouth 3 (three) times daily as needed for dizziness., Disp: , Rfl:    metFORMIN (GLUCOPHAGE) 1000 MG tablet, Take 1,000 mg by mouth 2 (two) times daily., Disp: , Rfl:    metoprolol  tartrate (LOPRESSOR ) 25 MG tablet, TAKE 1 TABLET TWICE A DAY (Patient taking differently: Take 25 mg by mouth 2 (two) times daily.), Disp: 60 tablet, Rfl: 0   Multiple Vitamins-Minerals (PRESERVISION AREDS 2 PO), Take 1 tablet by  mouth in the morning and at bedtime., Disp: , Rfl:    nitroGLYCERIN  (NITROSTAT ) 0.4 MG SL tablet, Place 1 tablet (0.4 mg total) under the tongue every 5 (five) minutes x 3 doses as needed for chest pain., Disp: 25 tablet, Rfl: 3   pantoprazole  (PROTONIX ) 40 MG tablet, TAKE 1 TABLET BY MOUTH ONCE DAILY --  NEEDS  APPOINTMENT  FOR  FURTHER  REFILLS (Patient taking differently: Take 40 mg by mouth daily.), Disp: 15 tablet, Rfl: 0   silodosin  (RAPAFLO ) 8 MG CAPS capsule, Take 8 mg by mouth daily with breakfast., Disp: , Rfl:   Past Medical History: Past Medical History:  Diagnosis Date   Angina pectoris (HCC)    Arthritis    Bladder cancer (HCC) 03/2020   Bladder carcinoma (HCC)    BMI 35.0-35.9,adult    CAD (coronary artery disease)    a. 03/2012 Cath: LAD 80, RCA 80, nl EF;  b. PCI of RCA w 3.0x17mm Promus DES, FFR of LAD was nl @ 0.85 ->Med Rx.;  c. LHC 02/15/13: proximal LAD 40-50%, mid LAD 95%, small D1 95-99%, mid CFX 30-40%, proximal RCA stent patent with ostial 20-30% prior to the stent, ostial PDA 50%, EF 55-65%. PCI:  Promus DES to the mid LAD and POBA to the proximal D1    Chest pain    COVID    DM (diabetes mellitus) (HCC)  Elevated TSH    Headache    History of angina    Hypercholesterolemia    Hyperlipidemia    Hypertension    Hypotension    Kidney stone    Knee pain    Low back pain    Morbid obesity (HCC)    OA (osteoarthritis)    Obesity    OSA (obstructive sleep apnea)    Sepsis (HCC)    Skin lesion    Tension headache    Tinnitus, bilateral    Umbilical hernia    UTI (urinary tract infection)     Tobacco Use: Social History   Tobacco Use  Smoking Status Never  Smokeless Tobacco Never    Labs: Review Flowsheet  More data exists      Latest Ref Rng & Units 09/03/2017 02/01/2020 04/03/2020 03/26/2023 02/17/2024  Labs for ITP Cardiac and Pulmonary Rehab  Cholestrol 0 - 200 mg/dL - - - - 98   LDL (calc) 0 - 99 mg/dL - - - - 50   HDL-C >08 mg/dL - - - - 36    Trlycerides <150 mg/dL - - - - 61   Hemoglobin A1c 4.8 - 5.6 % - 6.3  6.5  6.4  6.4   TCO2 22 - 32 mmol/L 21  - - - -    Capillary Blood Glucose: Lab Results  Component Value Date   GLUCAP 111 (H) 03/23/2024   GLUCAP 108 (H) 03/19/2024   GLUCAP 118 (H) 02/23/2024   GLUCAP 191 (H) 02/18/2024   GLUCAP 105 (H) 02/18/2024     Exercise Target Goals: Exercise Program Goal: Individual exercise prescription set using results from initial 6 min walk test and THRR while considering  patient's activity barriers and safety.   Exercise Prescription Goal: Starting with aerobic activity 30 plus minutes a day, 3 days per week for initial exercise prescription. Provide home exercise prescription and guidelines that participant acknowledges understanding prior to discharge.  Activity Barriers & Risk Stratification:  Activity Barriers & Cardiac Risk Stratification - 03/14/24 1119       Activity Barriers & Cardiac Risk Stratification   Activity Barriers Shortness of Breath;Arthritis;Back Problems;Balance Concerns;History of Falls;Muscular Weakness;Deconditioning;Joint Problems    Cardiac Risk Stratification Moderate             6 Minute Walk:  6 Minute Walk     Row Name 03/15/24 1451         6 Minute Walk   Phase Initial     Distance 950 feet     Walk Time 6 minutes     # of Rest Breaks 0     MPH 1.8     METS 1.32     RPE 12     Perceived Dyspnea  0     VO2 Peak 4.36     Symptoms No     Resting HR 66 bpm     Resting BP 100/64     Resting Oxygen  Saturation  97 %     Exercise Oxygen  Saturation  during 6 min walk 96 %     Max Ex. HR 79 bpm     Max Ex. BP 120/64     2 Minute Post BP 112/64              Oxygen  Initial Assessment:   Oxygen  Re-Evaluation:   Oxygen  Discharge (Final Oxygen  Re-Evaluation):   Initial Exercise Prescription:  Initial Exercise Prescription - 03/15/24 1400       Date of Initial  Exercise RX and Referring Provider   Date 03/15/24     Referring Provider Jackquelyn Mass MD      Treadmill   MPH 1.5    Grade 0    Minutes 15    METs 2.15      NuStep   Level 1    SPM 60    Minutes 15    METs 1.9      Prescription Details   Frequency (times per week) 3    Duration Progress to 30 minutes of continuous aerobic without signs/symptoms of physical distress      Intensity   THRR 40-80% of Max Heartrate 98-131    Ratings of Perceived Exertion 11-13    Perceived Dyspnea 0-4      Resistance Training   Training Prescription Yes    Weight 4    Reps 10-15             Perform Capillary Blood Glucose checks as needed.  Exercise Prescription Changes:   Exercise Comments:   Exercise Comments     Row Name 03/19/24 1112           Exercise Comments First full day of exercise!  Patient was oriented to gym and equipment including functions, settings, policies, and procedures.  Patient's individual exercise prescription and treatment plan were reviewed.  All starting workloads were established based on the results of the 6 minute walk test done at initial orientation visit.  The plan for exercise progression was also introduced and progression will be customized based on patient's performance and goals.                Exercise Goals and Review:   Exercise Goals     Row Name 03/15/24 1457             Exercise Goals   Increase Physical Activity Yes       Intervention Provide advice, education, support and counseling about physical activity/exercise needs.;Develop an individualized exercise prescription for aerobic and resistive training based on initial evaluation findings, risk stratification, comorbidities and participant's personal goals.       Expected Outcomes Short Term: Attend rehab on a regular basis to increase amount of physical activity.;Long Term: Add in home exercise to make exercise part of routine and to increase amount of physical activity.;Long Term: Exercising regularly at least 3-5 days a  week.       Increase Strength and Stamina Yes       Intervention Develop an individualized exercise prescription for aerobic and resistive training based on initial evaluation findings, risk stratification, comorbidities and participant's personal goals.;Provide advice, education, support and counseling about physical activity/exercise needs.       Expected Outcomes Short Term: Increase workloads from initial exercise prescription for resistance, speed, and METs.;Short Term: Perform resistance training exercises routinely during rehab and add in resistance training at home;Long Term: Improve cardiorespiratory fitness, muscular endurance and strength as measured by increased METs and functional capacity ( )       Able to understand and use rate of perceived exertion (RPE) scale Yes       Intervention Provide education and explanation on how to use RPE scale       Expected Outcomes Short Term: Able to use RPE daily in rehab to express subjective intensity level;Long Term:  Able to use RPE to guide intensity level when exercising independently       Able to understand and use Dyspnea scale Yes  Intervention Provide education and explanation on how to use Dyspnea scale       Expected Outcomes Short Term: Able to use Dyspnea scale daily in rehab to express subjective sense of shortness of breath during exertion;Long Term: Able to use Dyspnea scale to guide intensity level when exercising independently       Knowledge and understanding of Target Heart Rate Range (THRR) Yes       Intervention Provide education and explanation of THRR including how the numbers were predicted and where they are located for reference       Expected Outcomes Short Term: Able to state/look up THRR;Long Term: Able to use THRR to govern intensity when exercising independently;Short Term: Able to use daily as guideline for intensity in rehab       Able to check pulse independently Yes       Intervention Provide education and  demonstration on how to check pulse in carotid and radial arteries.;Review the importance of being able to check your own pulse for safety during independent exercise       Expected Outcomes Short Term: Able to explain why pulse checking is important during independent exercise;Long Term: Able to check pulse independently and accurately       Understanding of Exercise Prescription Yes       Intervention Provide education, explanation, and written materials on patient's individual exercise prescription       Expected Outcomes Short Term: Able to explain program exercise prescription;Long Term: Able to explain home exercise prescription to exercise independently                Exercise Goals Re-Evaluation :  Exercise Goals Re-Evaluation     Row Name 03/19/24 1112 03/30/24 1127           Exercise Goal Re-Evaluation   Exercise Goals Review Knowledge and understanding of Target Heart Rate Range (THRR);Able to understand and use rate of perceived exertion (RPE) scale;Understanding of Exercise Prescription Increase Physical Activity;Increase Strength and Stamina;Understanding of Exercise Prescription      Comments Reviewed RPE and dyspnea scale, THR and program prescription with pt today.  Pt voiced understanding and was given a copy of goals to take home. Davarius is doing well in rehab. He has noticed since starting the program that his energy has increased some and also able to do a little more around the house and yard then he use to. He is walking laps around his yard almost everyday and is enjoying getting to do things outside.      Expected Outcomes Short: Use RPE daily to regulate intensity.  Long: Follow program prescription in THR. Short: continue to attend rehab regulary and increase work loads   Long: continue to exercise for increased staminia                Discharge Exercise Prescription (Final Exercise Prescription Changes):   Nutrition:  Target Goals: Understanding of  nutrition guidelines, daily intake of sodium 1500mg , cholesterol 200mg , calories 30% from fat and 7% or less from saturated fats, daily to have 5 or more servings of fruits and vegetables.  Biometrics:  Pre Biometrics - 03/15/24 1457       Pre Biometrics   Height 5' 6.5" (1.689 m)    Weight 230 lb 13.2 oz (104.7 kg)    Waist Circumference 49 inches    Hip Circumference 45 inches    Waist to Hip Ratio 1.09 %    BMI (Calculated) 36.7    Grip Strength  30 kg              Nutrition Therapy Plan and Nutrition Goals:  Nutrition Therapy & Goals - 03/14/24 1133       Intervention Plan   Intervention Prescribe, educate and counsel regarding individualized specific dietary modifications aiming towards targeted core components such as weight, hypertension, lipid management, diabetes, heart failure and other comorbidities.;Nutrition handout(s) given to patient.    Expected Outcomes Short Term Goal: Understand basic principles of dietary content, such as calories, fat, sodium, cholesterol and nutrients.;Long Term Goal: Adherence to prescribed nutrition plan.             Nutrition Assessments:  MEDIFICTS Score Key: >=70 Need to make dietary changes  40-70 Heart Healthy Diet <= 40 Therapeutic Level Cholesterol Diet  Flowsheet Row CARDIAC REHAB PHASE II ORIENTATION from 03/15/2024 in [facility] CARDIAC REHABILITATION  Picture Your Plate Total Score on Admission 58      Picture Your Plate Scores: <47 Unhealthy dietary pattern with much room for improvement. 41-50 Dietary pattern unlikely to meet recommendations for good health and room for improvement. 51-60 More healthful dietary pattern, with some room for improvement.  >60 Healthy dietary pattern, although there may be some specific behaviors that could be improved.    Nutrition Goals Re-Evaluation:  Nutrition Goals Re-Evaluation     Row Name 03/30/24 1137             Goals   Nutrition Goal Healthy eating        Comment Dace is doing well in rehab. He stated that he is trying to eat healthier. Him and his wife are working on cooking better meals. He said that he eats a lot of salads and enjoys them. They have been doing baked chicken and veggies for dinner and he liked this meal. He drinks mosty water  and lemonade. He said he did not like paying for sodas at resturants so he started to drink water  and then add lemon to it. He does not eat a lot of sweets just every once and a while.       Expected Outcome Short Term: continue to find meals that are healthy that he enjoys      long term: Continue to eat helathy choices                Nutrition Goals Discharge (Final Nutrition Goals Re-Evaluation):  Nutrition Goals Re-Evaluation - 03/30/24 1137       Goals   Nutrition Goal Healthy eating    Comment Napolean is doing well in rehab. He stated that he is trying to eat healthier. Him and his wife are working on cooking better meals. He said that he eats a lot of salads and enjoys them. They have been doing baked chicken and veggies for dinner and he liked this meal. He drinks mosty water  and lemonade. He said he did not like paying for sodas at resturants so he started to drink water  and then add lemon to it. He does not eat a lot of sweets just every once and a while.    Expected Outcome Short Term: continue to find meals that are healthy that he enjoys      long term: Continue to eat helathy choices             Psychosocial: Target Goals: Acknowledge presence or absence of significant depression and/or stress, maximize coping skills, provide positive support system. Participant is able to verbalize types and ability to use techniques  and skills needed for reducing stress and depression.  Initial Review & Psychosocial Screening:  Initial Psych Review & Screening - 03/14/24 1113       Initial Review   Current issues with Current Stress Concerns    Source of Stress Concerns Chronic Illness;Family     Comments Pt's wife has had multiple surgeries on her toes/feet the last few years, and the pt's daughter has terminal cancer.      Family Dynamics   Good Support System? Yes    Comments Pt's wife and children are his main support system.      Barriers   Psychosocial barriers to participate in program There are no identifiable barriers or psychosocial needs.;The patient should benefit from training in stress management and relaxation.      Screening Interventions   Interventions Encouraged to exercise;To provide support and resources with identified psychosocial needs;Provide feedback about the scores to participant    Expected Outcomes Long Term Goal: Stressors or current issues are controlled or eliminated.;Short Term goal: Utilizing psychosocial counselor, staff and physician to assist with identification of specific Stressors or current issues interfering with healing process. Setting desired goal for each stressor or current issue identified.;Short Term goal: Identification and review with participant of any Quality of Life or Depression concerns found by scoring the questionnaire.;Long Term goal: The participant improves quality of Life and PHQ9 Scores as seen by post scores and/or verbalization of changes             Quality of Life Scores:  Quality of Life - 03/15/24 1532       Quality of Life   Select Quality of Life      Quality of Life Scores   Health/Function Pre 14.67 %    Socioeconomic Pre 23.79 %    Psych/Spiritual Pre 24 %    Family Pre 17.3 %    GLOBAL Pre 18.85 %            Scores of 19 and below usually indicate a poorer quality of life in these areas.  A difference of  2-3 points is a clinically meaningful difference.  A difference of 2-3 points in the total score of the Quality of Life Index has been associated with significant improvement in overall quality of life, self-image, physical symptoms, and general health in studies assessing change in quality of  life.  PHQ-9: Review Flowsheet       03/15/2024 03/15/2013 06/01/2012  Depression screen PHQ 2/9  Decreased Interest 3 0 0  Down, Depressed, Hopeless 3 0 1  PHQ - 2 Score 6 0 1  Altered sleeping 1 - -  Tired, decreased energy 3 - -  Change in appetite 1 - -  Feeling bad or failure about yourself  1 - -  Trouble concentrating 0 - -  Moving slowly or fidgety/restless 2 - -  Suicidal thoughts 0 - -  PHQ-9 Score 14 - -  Difficult doing work/chores Very difficult - -   Interpretation of Total Score  Total Score Depression Severity:  1-4 = Minimal depression, 5-9 = Mild depression, 10-14 = Moderate depression, 15-19 = Moderately severe depression, 20-27 = Severe depression   Psychosocial Evaluation and Intervention:  Psychosocial Evaluation - 03/15/24 1500       Psychosocial Evaluation & Interventions   Interventions Stress management education;Relaxation education;Encouraged to exercise with the program and follow exercise prescription    Comments Patient scored 13 on his initial PHQ-9. He attributes his score to his daughter  being diagnosed with terminal cancer. He continues to deny any depression or anxiety. We will continue to monitor.    Expected Outcomes Pt will meet both his personal and program goals, and he will continue to have no identifiable psychosocial barriers.    Continue Psychosocial Services  Follow up required by staff             Psychosocial Re-Evaluation:  Psychosocial Re-Evaluation     Row Name 03/30/24 1133             Psychosocial Re-Evaluation   Current issues with None Identified       Comments Aaronmichael is doing well in rehab. He stated that his stress levels have been better since exercising and knowing he can get back to doing things he enjoyed. He does not have a lot of stress anymore and just lets it roll off his shoulders. He does have concerns about medical issues with his kidneys but is not letting this get to him       Expected Outcomes  Short: continue to monitor stress levels   long term: continue to have an outlet for stress and exercise for improved happyniess       Interventions Encouraged to attend Cardiac Rehabilitation for the exercise       Continue Psychosocial Services  Follow up required by staff                Psychosocial Discharge (Final Psychosocial Re-Evaluation):  Psychosocial Re-Evaluation - 03/30/24 1133       Psychosocial Re-Evaluation   Current issues with None Identified    Comments Camauri is doing well in rehab. He stated that his stress levels have been better since exercising and knowing he can get back to doing things he enjoyed. He does not have a lot of stress anymore and just lets it roll off his shoulders. He does have concerns about medical issues with his kidneys but is not letting this get to him    Expected Outcomes Short: continue to monitor stress levels   long term: continue to have an outlet for stress and exercise for improved happyniess    Interventions Encouraged to attend Cardiac Rehabilitation for the exercise    Continue Psychosocial Services  Follow up required by staff             Vocational Rehabilitation: Provide vocational rehab assistance to qualifying candidates.   Vocational Rehab Evaluation & Intervention:  Vocational Rehab - 03/14/24 1118       Initial Vocational Rehab Evaluation & Intervention   Assessment shows need for Vocational Rehabilitation No      Vocational Rehab Re-Evaulation   Comments The pt is retired and has no plans to return to work.             Education: Education Goals: Education classes will be provided on a weekly basis, covering required topics. Participant will state understanding/return demonstration of topics presented.  Learning Barriers/Preferences:  Learning Barriers/Preferences - 03/14/24 1117       Learning Barriers/Preferences   Learning Barriers Exercise Concerns    Learning Preferences Written Material              Education Topics: Hypertension, Hypertension Reduction -Define heart disease and high blood pressure. Discus how high blood pressure affects the body and ways to reduce high blood pressure. Flowsheet Row CARDIAC REHAB PHASE II EXERCISE from 04/11/2024 in [place] CARDIAC REHABILITATION  Date 04/11/24  Educator jh  Instruction Review Code 1- Verbalizes Understanding  Exercise and Your Heart -Discuss why it is important to exercise, the FITT principles of exercise, normal and abnormal responses to exercise, and how to exercise safely.   Angina -Discuss definition of angina, causes of angina, treatment of angina, and how to decrease risk of having angina.   Cardiac Medications -Review what the following cardiac medications are used for, how they affect the body, and side effects that may occur when taking the medications.  Medications include Aspirin , Beta blockers, calcium  channel blockers, ACE Inhibitors, angiotensin receptor blockers, diuretics, digoxin, and antihyperlipidemics.   Congestive Heart Failure -Discuss the definition of CHF, how to live with CHF, the signs and symptoms of CHF, and how keep track of weight and sodium intake.   Heart Disease and Intimacy -Discus the effect sexual activity has on the heart, how changes occur during intimacy as we age, and safety during sexual activity.   Smoking Cessation / COPD -Discuss different methods to quit smoking, the health benefits of quitting smoking, and the definition of COPD.   Nutrition I: Fats -Discuss the types of cholesterol, what cholesterol does to the heart, and how cholesterol levels can be controlled. Flowsheet Row CARDIAC REHAB PHASE II EXERCISE from 04/11/2024 in [place] CARDIAC REHABILITATION  Date 03/28/24  Educator [facility]  Instruction Review Code 1- Verbalizes Understanding       Nutrition II: Labels -Discuss the different components of food labels and how to read food  label   Heart Parts/Heart Disease and PAD -Discuss the anatomy of the heart, the pathway of blood circulation through the heart, and these are affected by heart disease.   Stress I: Signs and Symptoms -Discuss the causes of stress, how stress may lead to anxiety and depression, and ways to limit stress. Flowsheet Row CARDIAC REHAB PHASE II EXERCISE from 04/11/2024 in [place] CARDIAC REHABILITATION  Date 04/04/24  Educator HB  Instruction Review Code 1- Verbalizes Understanding       Stress II: Relaxation -Discuss different types of relaxation techniques to limit stress.   Warning Signs of Stroke / TIA -Discuss definition of a stroke, what the signs and symptoms are of a stroke, and how to identify when someone is having stroke.   Knowledge Questionnaire Score:  Knowledge Questionnaire Score - 03/15/24 1457       Knowledge Questionnaire Score   Pre Score 22/24             Core Components/Risk Factors/Patient Goals at Admission:  Personal Goals and Risk Factors at Admission - 03/14/24 1121       Core Components/Risk Factors/Patient Goals on Admission    Weight Management Yes    Intervention Weight Management: Provide education and appropriate resources to help participant work on and attain dietary goals.;Weight Management: Develop a combined nutrition and exercise program designed to reach desired caloric intake, while maintaining appropriate intake of nutrient and fiber, sodium and fats, and appropriate energy expenditure required for the weight goal.    Expected Outcomes Long Term: Adherence to nutrition and physical activity/exercise program aimed toward attainment of established weight goal;Short Term: Continue to assess and modify interventions until short term weight is achieved;Weight Maintenance: Understanding of the daily nutrition guidelines, which includes 25-35% calories from fat, 7% or less cal from saturated fats, less than 200mg  cholesterol, less than  1.5gm of sodium, & 5 or more servings of fruits and vegetables daily    Improve shortness of breath with ADL's Yes    Intervention Provide education, individualized exercise plan and daily activity  instruction to help decrease symptoms of SOB with activities of daily living.    Expected Outcomes Short Term: Improve cardiorespiratory fitness to achieve a reduction of symptoms when performing ADLs;Long Term: Be able to perform more ADLs without symptoms or delay the onset of symptoms    Diabetes Yes    Intervention Provide education about signs/symptoms and action to take for hypo/hyperglycemia.;Provide education about proper nutrition, including hydration, and aerobic/resistive exercise prescription along with prescribed medications to achieve blood glucose in normal ranges: Fasting glucose 65-99 mg/dL    Expected Outcomes Short Term: Participant verbalizes understanding of the signs/symptoms and immediate care of hyper/hypoglycemia, proper foot care and importance of medication, aerobic/resistive exercise and nutrition plan for blood glucose control.;Long Term: Attainment of HbA1C < 7%.    Hypertension Yes    Intervention Provide education on lifestyle modifcations including regular physical activity/exercise, weight management, moderate sodium restriction and increased consumption of fresh fruit, vegetables, and low fat dairy, alcohol moderation, and smoking cessation.;Monitor prescription use compliance.    Expected Outcomes Short Term: Continued assessment and intervention until BP is < 140/27mm HG in hypertensive participants. < 130/19mm HG in hypertensive participants with diabetes, heart failure or chronic kidney disease.;Long Term: Maintenance of blood pressure at goal levels.    Lipids Yes    Intervention Provide education and support for participant on nutrition & aerobic/resistive exercise along with prescribed medications to achieve LDL 70mg , HDL >40mg .    Expected Outcomes Short Term:  Participant states understanding of desired cholesterol values and is compliant with medications prescribed. Participant is following exercise prescription and nutrition guidelines.;Long Term: Cholesterol controlled with medications as prescribed, with individualized exercise RX and with personalized nutrition plan. Value goals: LDL < 70mg , HDL > 40 mg.    Stress Yes    Intervention Refer participants experiencing significant psychosocial distress to appropriate mental health specialists for further evaluation and treatment. When possible, include family members and significant others in education/counseling sessions.;Offer individual and/or small group education and counseling on adjustment to heart disease, stress management and health-related lifestyle change. Teach and support self-help strategies.    Expected Outcomes Short Term: Participant demonstrates changes in health-related behavior, relaxation and other stress management skills, ability to obtain effective social support, and compliance with psychotropic medications if prescribed.;Long Term: Emotional wellbeing is indicated by absence of clinically significant psychosocial distress or social isolation.    Personal Goal Other Yes    Intervention Pt will attend CR 3 days a week.    Expected Outcomes Pt will meet both his personal and program goals.             Core Components/Risk Factors/Patient Goals Review:   Goals and Risk Factor Review     Row Name 03/30/24 1142             Core Components/Risk Factors/Patient Goals Review   Personal Goals Review Weight Management/Obesity       Review Chaitanya is doing well in rehab. He is enjoying coming to rehab and exercising. He does not check his blood sugar at home, we checked it for his first couple visits due to his medication that he is one that lowers blood surgar. His values were WNL for exercise. He does check his blood pressure at home and trys  to do it daily. He is trying to eat  healthy meals to help with weight managment.       Expected Outcomes Short: comtinue to eat healthy for BS and weight manaement    long term:  continue to check blood pressure daily                Core Components/Risk Factors/Patient Goals at Discharge (Final Review):   Goals and Risk Factor Review - 03/30/24 1142       Core Components/Risk Factors/Patient Goals Review   Personal Goals Review Weight Management/Obesity    Review Michio is doing well in rehab. He is enjoying coming to rehab and exercising. He does not check his blood sugar at home, we checked it for his first couple visits due to his medication that he is one that lowers blood surgar. His values were WNL for exercise. He does check his blood pressure at home and trys  to do it daily. He is trying to eat healthy meals to help with weight managment.    Expected Outcomes Short: comtinue to eat healthy for BS and weight manaement    long term: continue to check blood pressure daily             ITP Comments:  ITP Comments     Row Name 03/15/24 1523 03/19/24 1112 04/11/24 1151       ITP Comments Patient arrived for 1st visit/orientation/education at 1330. Patient was referred to CR by Dr. Jasmine MesiAndree Kayser due to NSTEMI/Status post stent placement. . During orientation advised patient on arrival and appointment times what to wear, what to do before, during and after exercise. Reviewed attendance and class policy.  Pt is scheduled to return Cardiac Rehab on 03/19/24 at 1100. Pt was advised to come to class 15 minutes before class starts.  Discussed RPE/Dpysnea scales. Patient participated in warm up stretches. Patient was able to complete 6 minute walk test.  Telemetry:NSR. Patient was measured for the equipment. Discussed equipment safety with patient. Took patient pre-anthropometric measurements. Patient finished visit at 1430. First full day of exercise!  Patient was oriented to gym and equipment including functions, settings,  policies, and procedures.  Patient's individual exercise prescription and treatment plan were reviewed.  All starting workloads were established based on the results of the 6 minute walk test done at initial orientation visit.  The plan for exercise progression was also introduced and progression will be customized based on patient's performance and goals. 30 day review completed. ITP sent to Dr. Armida Lander, Medical Director of Cardiac Rehab. Continue with ITP unless changes are made by physician.              Comments: 30 day review

## 2024-04-13 ENCOUNTER — Encounter (HOSPITAL_COMMUNITY)

## 2024-04-13 ENCOUNTER — Telehealth (HOSPITAL_COMMUNITY): Payer: Self-pay | Admitting: *Deleted

## 2024-04-13 NOTE — Telephone Encounter (Signed)
 Spoke to pt's wife.  He had left home but has not arrived at rehab.  She was concerned and calling daughter and brother to check on him.

## 2024-04-16 ENCOUNTER — Encounter (HOSPITAL_COMMUNITY)

## 2024-04-18 ENCOUNTER — Encounter (HOSPITAL_COMMUNITY)
Admission: RE | Admit: 2024-04-18 | Discharge: 2024-04-18 | Disposition: A | Discharge status: Home / Self Care | Source: Ambulatory Visit | Attending: Cardiovascular Disease

## 2024-04-18 DIAGNOSIS — I214 Non-ST elevation (NSTEMI) myocardial infarction: Secondary | ICD-10-CM | POA: Diagnosis not present

## 2024-04-18 DIAGNOSIS — Z955 Presence of coronary angioplasty implant and graft: Secondary | ICD-10-CM

## 2024-04-18 NOTE — Progress Notes (Signed)
 Daily Session Note  Patient Details  Name: KATHRYN LINAREZ MRN: [id number] Date of Birth: September 02, 1951 Referring Provider:   Flowsheet Row CARDIAC REHAB PHASE II ORIENTATION from 03/15/2024 in [facility] CARDIAC REHABILITATION  Referring Provider Jackquelyn Mass MD       Encounter Date: 04/18/2024  Check In:  Session Check In - 04/18/24 1030       Check-In   Supervising physician immediately available to respond to emergencies See telemetry face sheet for immediately available MD    Location [facility]    Staff Present Rita Cherry, MA, RCEP, CCRP, CCET;Aariah Godette Toy Freund, [place], Exercise Physiologist    Virtual Visit No    Medication changes reported     No    Fall or balance concerns reported    No    Tobacco Cessation No Change    Warm-up and Cool-down Performed on first and last piece of equipment    Resistance Training Performed Yes    VAD Patient? No    PAD/SET Patient? No      Pain Assessment   Currently in Pain? No/denies    Pain Score 0-No pain    Multiple Pain Sites No             Capillary Blood Glucose: No results found for this or any previous visit (from the past 24 hours).    Social History   Tobacco Use  Smoking Status Never  Smokeless Tobacco Never    Goals Met:  Independence with exercise equipment Exercise tolerated well No report of concerns or symptoms today Strength training completed today  Goals Unmet:  Not Applicable  Comments: Pt able to follow exercise prescription today without complaint.  Will continue to monitor for progression.

## 2024-04-20 ENCOUNTER — Encounter (HOSPITAL_COMMUNITY)
Admission: RE | Admit: 2024-04-20 | Discharge: 2024-04-20 | Disposition: A | Discharge status: Home / Self Care | Source: Ambulatory Visit | Attending: Cardiovascular Disease | Admitting: Cardiovascular Disease

## 2024-04-20 DIAGNOSIS — I214 Non-ST elevation (NSTEMI) myocardial infarction: Secondary | ICD-10-CM

## 2024-04-20 DIAGNOSIS — Z955 Presence of coronary angioplasty implant and graft: Secondary | ICD-10-CM

## 2024-04-20 NOTE — Progress Notes (Signed)
 Daily Session Note  Patient Details  Name: Kyle Lambert MRN: [id number] Date of Birth: [birth year] Referring Provider:   Flowsheet Row CARDIAC REHAB PHASE II ORIENTATION from 03/15/2024 in [facility] CARDIAC REHABILITATION  Referring Provider Jackquelyn Mass MD       Encounter Date: 04/20/2024  Check In:  Session Check In - 04/20/24 1114       Check-In   Supervising physician immediately available to respond to emergencies See telemetry face sheet for immediately available MD    Location [facility]    Staff Present Rita Cherry, MA, RCEP, CCRP, Amador Bad, RN, BSN    Virtual Visit No    Medication changes reported     No    Fall or balance concerns reported    No    Warm-up and Cool-down Performed on first and last piece of equipment    Resistance Training Performed Yes    VAD Patient? No    PAD/SET Patient? No      Pain Assessment   Currently in Pain? No/denies             Capillary Blood Glucose: No results found for this or any previous visit (from the past 24 hours).    Social History   Tobacco Use  Smoking Status Never  Smokeless Tobacco Never    Goals Met:  Exercise tolerated well No report of concerns or symptoms today Strength training completed today  Goals Unmet:  Not Applicable  Comments: Pt able to follow exercise prescription today without complaint.  Will continue to monitor for progression.

## 2024-04-23 ENCOUNTER — Other Ambulatory Visit: Payer: Self-pay | Admitting: Urology

## 2024-04-23 ENCOUNTER — Encounter (HOSPITAL_COMMUNITY)
Admission: RE | Admit: 2024-04-23 | Discharge: 2024-04-23 | Disposition: A | Discharge status: Home / Self Care | Source: Ambulatory Visit | Attending: Cardiovascular Disease | Admitting: Cardiovascular Disease

## 2024-04-23 ENCOUNTER — Telehealth: Payer: Self-pay | Admitting: Urology

## 2024-04-23 DIAGNOSIS — Z955 Presence of coronary angioplasty implant and graft: Secondary | ICD-10-CM | POA: Diagnosis not present

## 2024-04-23 DIAGNOSIS — I214 Non-ST elevation (NSTEMI) myocardial infarction: Secondary | ICD-10-CM | POA: Diagnosis not present

## 2024-04-23 DIAGNOSIS — N200 Calculus of kidney: Secondary | ICD-10-CM

## 2024-04-23 NOTE — Telephone Encounter (Signed)
 Dr. Claretta Croft ordered CT

## 2024-04-23 NOTE — Telephone Encounter (Signed)
 Wife left message patient thinks he has another kidney stone. No appointments

## 2024-04-23 NOTE — Telephone Encounter (Signed)
 Cardiac  Rehab called and wants to know if you want any imaging done before the patient schedules an appointment for Rt flank pain.

## 2024-04-23 NOTE — Progress Notes (Signed)
 Daily Session Note  Patient Details  Name: Kyle Lambert MRN: [id number] Date of Birth: 09-27-1951 Referring Provider:   Flowsheet Row CARDIAC REHAB PHASE II ORIENTATION from 03/15/2024 in [facility] CARDIAC REHABILITATION  Referring Provider Jackquelyn Mass MD       Encounter Date: 04/23/2024  Check In:  Session Check In - 04/23/24 1056       Check-In   Supervising physician immediately available to respond to emergencies See telemetry face sheet for immediately available MD    Staff Present Jerrol Morelle, BSN, RN, WTA-C;Heather Toy Freund, BS, Exercise Physiologist    Virtual Visit No    Medication changes reported     No    Fall or balance concerns reported    No    Tobacco Cessation No Change    Warm-up and Cool-down Performed on first and last piece of equipment    Resistance Training Performed Yes    VAD Patient? No    PAD/SET Patient? No      Pain Assessment   Currently in Pain? No/denies             Capillary Blood Glucose: No results found for this or any previous visit (from the past 24 hours).    Social History   Tobacco Use  Smoking Status Never  Smokeless Tobacco Never    Goals Met:  Independence with exercise equipment Exercise tolerated well No report of concerns or symptoms today Strength training completed today  Goals Unmet:  Not Applicable  Comments: Pt able to follow exercise prescription today without complaint.  Will continue to monitor for progression.

## 2024-04-25 ENCOUNTER — Encounter (HOSPITAL_COMMUNITY)
Admission: RE | Admit: 2024-04-25 | Discharge: 2024-04-25 | Disposition: A | Discharge status: Home / Self Care | Source: Ambulatory Visit | Attending: Cardiovascular Disease | Admitting: Cardiovascular Disease

## 2024-04-25 DIAGNOSIS — I214 Non-ST elevation (NSTEMI) myocardial infarction: Secondary | ICD-10-CM | POA: Diagnosis not present

## 2024-04-25 DIAGNOSIS — Z955 Presence of coronary angioplasty implant and graft: Secondary | ICD-10-CM

## 2024-04-25 NOTE — Progress Notes (Signed)
 Daily Session Note  Patient Details  Name: Kyle Lambert MRN: [id number] Date of Birth: 1950-11-27 Referring Provider:   Flowsheet Row CARDIAC REHAB PHASE II ORIENTATION from 03/15/2024 in [facility] CARDIAC REHABILITATION  Referring Provider Kyle Mass MD       Encounter Date: 04/25/2024  Check In:  Session Check In - 04/25/24 1020       Check-In   Supervising physician immediately available to respond to emergencies See telemetry face sheet for immediately available MD    Location [facility]    Staff Present Kyle Lambert BSN, RN;Kyle Lambert, [place], Exercise Physiologist;Kyle Eilene Grater, RN    Virtual Visit No    Medication changes reported     No    Fall or balance concerns reported    No    Tobacco Cessation No Change    Warm-up and Cool-down Performed on first and last piece of equipment    Resistance Training Performed Yes    VAD Patient? No    PAD/SET Patient? No      Pain Assessment   Currently in Pain? No/denies    Pain Score 0-No pain    Multiple Pain Sites No             Capillary Blood Glucose: No results found for this or any previous visit (from the past 24 hours).    Social History   Tobacco Use  Smoking Status Never  Smokeless Tobacco Never    Goals Met:  Independence with exercise equipment Exercise tolerated well No report of concerns or symptoms today Strength training completed today  Goals Unmet:  Not Applicable  Comments: Kyle AasAaron AasPt able to follow exercise prescription today without complaint.  Will continue to monitor for progression.

## 2024-04-27 ENCOUNTER — Other Ambulatory Visit: Payer: Self-pay | Admitting: Cardiovascular Disease

## 2024-04-27 ENCOUNTER — Encounter (HOSPITAL_COMMUNITY)
Admission: RE | Admit: 2024-04-27 | Discharge: 2024-04-27 | Disposition: A | Discharge status: Home / Self Care | Source: Ambulatory Visit | Attending: Cardiovascular Disease | Admitting: Cardiovascular Disease

## 2024-04-27 DIAGNOSIS — Z955 Presence of coronary angioplasty implant and graft: Secondary | ICD-10-CM

## 2024-04-27 DIAGNOSIS — I214 Non-ST elevation (NSTEMI) myocardial infarction: Secondary | ICD-10-CM | POA: Diagnosis not present

## 2024-04-27 NOTE — Progress Notes (Signed)
 Daily Session Note  Patient Details  Name: Kyle Lambert MRN: [id number] Date of Birth: 31-Jan-1951 Referring Provider:   Flowsheet Row CARDIAC REHAB PHASE II ORIENTATION from 03/15/2024 in [facility] CARDIAC REHABILITATION  Referring Provider Jackquelyn Mass MD       Encounter Date: 04/27/2024  Check In:  Session Check In - 04/27/24 1048       Check-In   Supervising physician immediately available to respond to emergencies See telemetry face sheet for immediately available MD    Location [facility]    Staff Present Doug Gehrig, RN, BSN;Heather Toy Freund, BS, Exercise Physiologist    Virtual Visit No    Medication changes reported     No    Fall or balance concerns reported    No    Tobacco Cessation No Change    Warm-up and Cool-down Performed on first and last piece of equipment    Resistance Training Performed Yes    VAD Patient? No    PAD/SET Patient? No      Pain Assessment   Currently in Pain? No/denies             Capillary Blood Glucose: No results found for this or any previous visit (from the past 24 hours).    Social History   Tobacco Use  Smoking Status Never  Smokeless Tobacco Never    Goals Met:  Independence with exercise equipment Exercise tolerated well No report of concerns or symptoms today Strength training completed today  Goals Unmet:  Not Applicable  Comments: Pt able to follow exercise prescription today without complaint.  Will continue to monitor for progression.

## 2024-04-30 ENCOUNTER — Encounter (HOSPITAL_COMMUNITY)
Admission: RE | Admit: 2024-04-30 | Discharge: 2024-04-30 | Disposition: A | Discharge status: Home / Self Care | Source: Ambulatory Visit | Attending: Cardiovascular Disease

## 2024-04-30 DIAGNOSIS — I214 Non-ST elevation (NSTEMI) myocardial infarction: Secondary | ICD-10-CM | POA: Diagnosis not present

## 2024-04-30 DIAGNOSIS — Z955 Presence of coronary angioplasty implant and graft: Secondary | ICD-10-CM

## 2024-04-30 NOTE — Progress Notes (Signed)
 Daily Session Note  Patient Details  Name: Kyle Lambert MRN: [id number] Date of Birth: 21-Sep-1951 Referring Provider:   Flowsheet Row CARDIAC REHAB PHASE II ORIENTATION from 03/15/2024 in [facility] CARDIAC REHABILITATION  Referring Provider Jackquelyn Mass MD       Encounter Date: 04/30/2024  Check In:  Session Check In - 04/30/24 1101       Check-In   Supervising physician immediately available to respond to emergencies See telemetry face sheet for immediately available MD    Staff Present Rita Cherry, MA, RCEP, CCRP, CCET;Brittany Annette Barters, BSN, RN, WTA-C    Virtual Visit No    Medication changes reported     No    Fall or balance concerns reported    No    Warm-up and Cool-down Performed on first and last piece of equipment    Resistance Training Performed Yes    VAD Patient? No    PAD/SET Patient? No      Pain Assessment   Currently in Pain? No/denies             Capillary Blood Glucose: No results found for this or any previous visit (from the past 24 hours).    Social History   Tobacco Use  Smoking Status Never  Smokeless Tobacco Never    Goals Met:  Independence with exercise equipment Exercise tolerated well No report of concerns or symptoms today Strength training completed today  Goals Unmet:  Not Applicable  Comments: Pt able to follow exercise prescription today without complaint.  Will continue to monitor for progression.

## 2024-05-01 ENCOUNTER — Ambulatory Visit: Payer: Self-pay

## 2024-05-01 ENCOUNTER — Encounter (HOSPITAL_COMMUNITY): Payer: Self-pay

## 2024-05-01 ENCOUNTER — Ambulatory Visit (HOSPITAL_COMMUNITY)
Admission: RE | Admit: 2024-05-01 | Discharge: 2024-05-01 | Disposition: A | Discharge status: Home / Self Care | Source: Ambulatory Visit | Attending: Urology | Admitting: Urology

## 2024-05-01 DIAGNOSIS — N400 Enlarged prostate without lower urinary tract symptoms: Secondary | ICD-10-CM | POA: Diagnosis not present

## 2024-05-01 DIAGNOSIS — N200 Calculus of kidney: Secondary | ICD-10-CM | POA: Insufficient documentation

## 2024-05-01 DIAGNOSIS — Q631 Lobulated, fused and horseshoe kidney: Secondary | ICD-10-CM | POA: Diagnosis not present

## 2024-05-02 ENCOUNTER — Encounter (HOSPITAL_COMMUNITY)
Admission: RE | Admit: 2024-05-02 | Discharge: 2024-05-02 | Disposition: A | Discharge status: Home / Self Care | Source: Ambulatory Visit | Attending: Cardiovascular Disease | Admitting: Cardiovascular Disease

## 2024-05-02 DIAGNOSIS — Z955 Presence of coronary angioplasty implant and graft: Secondary | ICD-10-CM

## 2024-05-02 DIAGNOSIS — I214 Non-ST elevation (NSTEMI) myocardial infarction: Secondary | ICD-10-CM | POA: Diagnosis not present

## 2024-05-02 NOTE — Progress Notes (Signed)
 Daily Session Note  Patient Details  Name: JERZY ROEPKE MRN: [id number] Date of Birth: 1951-11-17 Referring Provider:   Flowsheet Row CARDIAC REHAB PHASE II ORIENTATION from 03/15/2024 in [facility] CARDIAC REHABILITATION  Referring Provider Jackquelyn Mass MD       Encounter Date: 05/02/2024  Check In:  Session Check In - 05/02/24 1024       Check-In   Supervising physician immediately available to respond to emergencies See telemetry face sheet for immediately available MD    Location [facility]    Staff Present Rita Cherry, MA, RCEP, CCRP, CCET;Hillary [place] BSN, RN;Heather [place], [place], Exercise Physiologist    Virtual Visit No    Medication changes reported     No    Fall or balance concerns reported    No    Tobacco Cessation No Change    Warm-up and Cool-down Performed on first and last piece of equipment    Resistance Training Performed Yes    VAD Patient? No    PAD/SET Patient? No      Pain Assessment   Currently in Pain? No/denies             Capillary Blood Glucose: No results found for this or any previous visit (from the past 24 hours).    Social History   Tobacco Use  Smoking Status Never  Smokeless Tobacco Never    Goals Met:  Independence with exercise equipment Exercise tolerated well No report of concerns or symptoms today Strength training completed today  Goals Unmet:  Not Applicable  Comments: Pt able to follow exercise prescription today without complaint.  Will continue to monitor for progression.

## 2024-05-04 ENCOUNTER — Encounter (HOSPITAL_COMMUNITY)
Admission: RE | Admit: 2024-05-04 | Discharge: 2024-05-04 | Disposition: A | Discharge status: Home / Self Care | Source: Ambulatory Visit | Attending: Cardiovascular Disease | Admitting: Cardiovascular Disease

## 2024-05-04 DIAGNOSIS — I214 Non-ST elevation (NSTEMI) myocardial infarction: Secondary | ICD-10-CM | POA: Diagnosis not present

## 2024-05-04 DIAGNOSIS — Z955 Presence of coronary angioplasty implant and graft: Secondary | ICD-10-CM

## 2024-05-04 NOTE — Progress Notes (Signed)
 Daily Session Note  Patient Details  Name: Kyle Lambert MRN: [id number] Date of Birth: 1951/07/10 Referring Provider:   Flowsheet Row CARDIAC REHAB PHASE II ORIENTATION from 03/15/2024 in [facility] CARDIAC REHABILITATION  Referring Provider Jackquelyn Mass MD    Encounter Date: 05/04/2024  Check In:  Session Check In - 05/04/24 1045       Check-In   Supervising physician immediately available to respond to emergencies See telemetry face sheet for immediately available MD    Location [facility]    Staff Present Clotilda Danish, BS, Exercise Physiologist;Brittany Annette Barters, BSN, RN, Tisha Forget, RN, BSN    Virtual Visit No    Medication changes reported     No    Fall or balance concerns reported    No    Tobacco Cessation No Change    Warm-up and Cool-down Performed on first and last piece of equipment    Resistance Training Performed Yes    VAD Patient? No    PAD/SET Patient? No      Pain Assessment   Currently in Pain? No/denies    Pain Score 0-No pain    Multiple Pain Sites No          Capillary Blood Glucose: No results found for this or any previous visit (from the past 24 hours).    Social History   Tobacco Use  Smoking Status Never  Smokeless Tobacco Never    Goals Met:  Independence with exercise equipment Exercise tolerated well No report of concerns or symptoms today Strength training completed today  Goals Unmet:  Not Applicable  Comments: Pt able to follow exercise prescription today without complaint.  Will continue to monitor for progression.

## 2024-05-07 ENCOUNTER — Encounter (HOSPITAL_COMMUNITY)
Admission: RE | Admit: 2024-05-07 | Discharge: 2024-05-07 | Disposition: A | Discharge status: Home / Self Care | Source: Ambulatory Visit | Attending: Cardiovascular Disease | Admitting: Cardiovascular Disease

## 2024-05-07 DIAGNOSIS — Z955 Presence of coronary angioplasty implant and graft: Secondary | ICD-10-CM

## 2024-05-07 DIAGNOSIS — I214 Non-ST elevation (NSTEMI) myocardial infarction: Secondary | ICD-10-CM | POA: Diagnosis not present

## 2024-05-07 NOTE — Progress Notes (Signed)
 Daily Session Note  Patient Details  Name: Kyle Lambert MRN: [id number] Date of Birth: 1951/05/22 Referring Provider:   Flowsheet Row CARDIAC REHAB PHASE II ORIENTATION from 03/15/2024 in [facility] CARDIAC REHABILITATION  Referring Provider Jackquelyn Mass MD    Encounter Date: 05/07/2024  Check In:  Session Check In - 05/07/24 1101       Check-In   Supervising physician immediately available to respond to emergencies See telemetry face sheet for immediately available MD    Location [facility]    Staff Present Clotilda Danish, BS, Exercise Physiologist;Brittany Annette Barters, BSN, RN, Berta Brittle, RN    Virtual Visit No    Medication changes reported     No    Fall or balance concerns reported    No    Tobacco Cessation No Change    Warm-up and Cool-down Performed on first and last piece of equipment    Resistance Training Performed Yes    VAD Patient? No    PAD/SET Patient? No      Pain Assessment   Currently in Pain? No/denies    Pain Score 0-No pain    Multiple Pain Sites No          Capillary Blood Glucose: No results found for this or any previous visit (from the past 24 hours).    Social History   Tobacco Use  Smoking Status Never  Smokeless Tobacco Never    Goals Met:  Independence with exercise equipment Exercise tolerated well No report of concerns or symptoms today Strength training completed today  Goals Unmet:  Not Applicable  Comments: Pt able to follow exercise prescription today without complaint.  Will continue to monitor for progression.

## 2024-05-09 ENCOUNTER — Encounter (HOSPITAL_COMMUNITY)
Admission: RE | Admit: 2024-05-09 | Discharge: 2024-05-09 | Disposition: A | Discharge status: Home / Self Care | Source: Ambulatory Visit | Attending: Cardiovascular Disease

## 2024-05-09 DIAGNOSIS — I214 Non-ST elevation (NSTEMI) myocardial infarction: Secondary | ICD-10-CM | POA: Diagnosis not present

## 2024-05-09 DIAGNOSIS — Z955 Presence of coronary angioplasty implant and graft: Secondary | ICD-10-CM

## 2024-05-09 NOTE — Progress Notes (Signed)
 Daily Session Note  Patient Details  Name: SEBASTYAN SNODGRASS MRN: [id number] Date of Birth: 03-01-1951 Referring Provider:   Flowsheet Row CARDIAC REHAB PHASE II ORIENTATION from 03/15/2024 in [facility] CARDIAC REHABILITATION  Referring Provider Jackquelyn Mass MD    Encounter Date: 05/09/2024  Check In:  Session Check In - 05/09/24 1039       Check-In   Supervising physician immediately available to respond to emergencies See telemetry face sheet for immediately available MD    Location [facility]    Staff Present Ronna Coho BSN, RN;Heather Toy Freund, [place], Exercise Physiologist;Brooke Rollin Clock, RN    Virtual Visit No    Medication changes reported     No    Fall or balance concerns reported    No    Tobacco Cessation No Change    Warm-up and Cool-down Performed on first and last piece of equipment    Resistance Training Performed Yes    VAD Patient? No    PAD/SET Patient? No      Pain Assessment   Currently in Pain? No/denies    Pain Score 0-No pain    Multiple Pain Sites No          Capillary Blood Glucose: No results found for this or any previous visit (from the past 24 hours).    Social History   Tobacco Use  Smoking Status Never  Smokeless Tobacco Never    Goals Met:  Independence with exercise equipment Exercise tolerated well No report of concerns or symptoms today Strength training completed today  Goals Unmet:  Not Applicable  Comments: Aaron AasAaron AasPt able to follow exercise prescription today without complaint.  Will continue to monitor for progression.

## 2024-05-09 NOTE — Progress Notes (Signed)
 Cardiac Individual Treatment Plan  Patient Details  Name: Kyle Lambert MRN: [id number] Date of Birth: 1951/09/01 Referring Provider:   Flowsheet Row CARDIAC REHAB PHASE II ORIENTATION from 03/15/2024 in [facility] CARDIAC REHABILITATION  Referring Provider Jackquelyn Mass MD    Initial Encounter Date:  Flowsheet Row CARDIAC REHAB PHASE II ORIENTATION from 03/15/2024 in [place] CARDIAC REHABILITATION  Date 03/15/24    Visit Diagnosis: Status post coronary artery stent placement  NSTEMI (non-ST elevated myocardial infarction) (HCC)  Patient's Home Medications on Admission:  Current Outpatient Medications:    amLODipine  (NORVASC ) 5 MG tablet, TAKE 1 TABLET DAILY, Disp: 90 tablet, Rfl: 0   apixaban  (ELIQUIS ) 5 MG TABS tablet, Take 1 tablet (5 mg total) by mouth 2 (two) times daily., Disp: 60 tablet, Rfl: 12   aspirin  EC 81 MG tablet, Take 81 mg by mouth daily., Disp: , Rfl:    atorvastatin  (LIPITOR ) 40 MG tablet, Take 40 mg by mouth at bedtime. , Disp: , Rfl:    clopidogrel  (PLAVIX ) 75 MG tablet, Take 1 tablet by mouth once daily, Disp: 90 tablet, Rfl: 3   cyclobenzaprine  (FLEXERIL ) 10 MG tablet, Take 10 mg by mouth daily as needed for muscle spasms., Disp: , Rfl:    Ferrous Sulfate  (IRON ) 325 (65 Fe) MG TABS, Take 1 tablet (325 mg total) by mouth 2 (two) times daily., Disp: 60 tablet, Rfl: 1   isosorbide  mononitrate (IMDUR ) 120 MG 24 hr tablet, Take 1 tablet (120 mg total) by mouth daily., Disp: 90 tablet, Rfl: 3   meclizine (ANTIVERT) 25 MG tablet, Take 25 mg by mouth 3 (three) times daily as needed for dizziness., Disp: , Rfl:    metFORMIN (GLUCOPHAGE) 1000 MG tablet, Take 1,000 mg by mouth 2 (two) times daily., Disp: , Rfl:    metoprolol  tartrate (LOPRESSOR ) 25 MG tablet, TAKE 1 TABLET TWICE A DAY (Patient taking differently: Take 25 mg by mouth 2 (two) times daily.), Disp: 60 tablet, Rfl: 0   Multiple Vitamins-Minerals (PRESERVISION AREDS 2 PO), Take 1 tablet by mouth in the  morning and at bedtime., Disp: , Rfl:    nitroGLYCERIN  (NITROSTAT ) 0.4 MG SL tablet, Place 1 tablet (0.4 mg total) under the tongue every 5 (five) minutes x 3 doses as needed for chest pain., Disp: 25 tablet, Rfl: 3   pantoprazole  (PROTONIX ) 40 MG tablet, TAKE 1 TABLET BY MOUTH ONCE DAILY --  NEEDS  APPOINTMENT  FOR  FURTHER  REFILLS (Patient taking differently: Take 40 mg by mouth daily.), Disp: 15 tablet, Rfl: 0   silodosin  (RAPAFLO ) 8 MG CAPS capsule, Take 8 mg by mouth daily with breakfast., Disp: , Rfl:   Past Medical History: Past Medical History:  Diagnosis Date   Angina pectoris (HCC)    Arthritis    Bladder cancer (HCC) 03/2020   Bladder carcinoma (HCC)    BMI 35.0-35.9,adult    CAD (coronary artery disease)    a. 03/2012 Cath: LAD 80, RCA 80, nl EF;  b. PCI of RCA w 3.0x74mm Promus DES, FFR of LAD was nl @ 0.85 ->Med Rx.;  c. LHC 02/15/13: proximal LAD 40-50%, mid LAD 95%, small D1 95-99%, mid CFX 30-40%, proximal RCA stent patent with ostial 20-30% prior to the stent, ostial PDA 50%, EF 55-65%. PCI:  Promus DES to the mid LAD and POBA to the proximal D1    Chest pain    COVID    DM (diabetes mellitus) (HCC)    Elevated TSH    Headache  History of angina    Hypercholesterolemia    Hyperlipidemia    Hypertension    Hypotension    Kidney stone    Knee pain    Low back pain    Morbid obesity (HCC)    OA (osteoarthritis)    Obesity    OSA (obstructive sleep apnea)    Sepsis (HCC)    Skin lesion    Tension headache    Tinnitus, bilateral    Umbilical hernia    UTI (urinary tract infection)     Tobacco Use: Social History   Tobacco Use  Smoking Status Never  Smokeless Tobacco Never    Labs: Review Flowsheet  More data exists      Latest Ref Rng & Units 09/03/2017 02/01/2020 04/03/2020 03/26/2023 02/17/2024  Labs for ITP Cardiac and Pulmonary Rehab  Cholestrol 0 - 200 mg/dL - - - - 98   LDL (calc) 0 - 99 mg/dL - - - - 50   HDL-C >16 mg/dL - - - - 36    Trlycerides <150 mg/dL - - - - 61   Hemoglobin A1c 4.8 - 5.6 % - 6.3  6.5  6.4  6.4   TCO2 22 - 32 mmol/L 21  - - - -    Capillary Blood Glucose: Lab Results  Component Value Date   GLUCAP 111 (H) 03/23/2024   GLUCAP 108 (H) 03/19/2024   GLUCAP 118 (H) 02/23/2024   GLUCAP 191 (H) 02/18/2024   GLUCAP 105 (H) 02/18/2024     Exercise Target Goals: Exercise Program Goal: Individual exercise prescription set using results from initial 6 min walk test and THRR while considering  patient's activity barriers and safety.   Exercise Prescription Goal: Starting with aerobic activity 30 plus minutes a day, 3 days per week for initial exercise prescription. Provide home exercise prescription and guidelines that participant acknowledges understanding prior to discharge.  Activity Barriers & Risk Stratification:  Activity Barriers & Cardiac Risk Stratification - 03/14/24 1119       Activity Barriers & Cardiac Risk Stratification   Activity Barriers Shortness of Breath;Arthritis;Back Problems;Balance Concerns;History of Falls;Muscular Weakness;Deconditioning;Joint Problems    Cardiac Risk Stratification Moderate          6 Minute Walk:  6 Minute Walk     Row Name 03/15/24 1451         6 Minute Walk   Phase Initial     Distance 950 feet     Walk Time 6 minutes     # of Rest Breaks 0     MPH 1.8     METS 1.32     RPE 12     Perceived Dyspnea  0     VO2 Peak 4.36     Symptoms No     Resting HR 66 bpm     Resting BP 100/64     Resting Oxygen  Saturation  97 %     Exercise Oxygen  Saturation  during 6 min walk 96 %     Max Ex. HR 79 bpm     Max Ex. BP 120/64     2 Minute Post BP 112/64        Oxygen  Initial Assessment:   Oxygen  Re-Evaluation:   Oxygen  Discharge (Final Oxygen  Re-Evaluation):   Initial Exercise Prescription:  Initial Exercise Prescription - 03/15/24 1400       Date of Initial Exercise RX and Referring Provider   Date 03/15/24    Referring  Provider Jackquelyn Mass MD  Treadmill   MPH 1.5    Grade 0    Minutes 15    METs 2.15      NuStep   Level 1    SPM 60    Minutes 15    METs 1.9      Prescription Details   Frequency (times per week) 3    Duration Progress to 30 minutes of continuous aerobic without signs/symptoms of physical distress      Intensity   THRR 40-80% of Max Heartrate 98-131    Ratings of Perceived Exertion 11-13    Perceived Dyspnea 0-4      Resistance Training   Training Prescription Yes    Weight 4    Reps 10-15          Perform Capillary Blood Glucose checks as needed.  Exercise Prescription Changes:   Exercise Prescription Changes     Row Name 04/18/24 1300             Response to Exercise   Blood Pressure (Admit) 124/64       Blood Pressure (Exit) 116/72       Heart Rate (Admit) 50 bpm       Heart Rate (Exercise) 99 bpm       Heart Rate (Exit) 66 bpm       Rating of Perceived Exertion (Exercise) 12       Duration Continue with 30 min of aerobic exercise without signs/symptoms of physical distress.       Intensity THRR unchanged         Progression   Progression Continue to progress workloads to maintain intensity without signs/symptoms of physical distress.         Resistance Training   Training Prescription Yes       Weight 4       Reps 10-15         Treadmill   MPH 2       Grade 0       Minutes 15       METs 2.53         NuStep   Level 4       SPM 95       Minutes 15       METs 3          Exercise Comments:   Exercise Comments     Row Name 03/19/24 1112           Exercise Comments First full day of exercise!  Patient was oriented to gym and equipment including functions, settings, policies, and procedures.  Patient's individual exercise prescription and treatment plan were reviewed.  All starting workloads were established based on the results of the 6 minute walk test done at initial orientation visit.  The plan for exercise progression was  also introduced and progression will be customized based on patient's performance and goals.          Exercise Goals and Review:   Exercise Goals     Row Name 03/15/24 1457             Exercise Goals   Increase Physical Activity Yes       Intervention Provide advice, education, support and counseling about physical activity/exercise needs.;Develop an individualized exercise prescription for aerobic and resistive training based on initial evaluation findings, risk stratification, comorbidities and participant's personal goals.       Expected Outcomes Short Term: Attend rehab on a regular basis to increase amount of  physical activity.;Long Term: Add in home exercise to make exercise part of routine and to increase amount of physical activity.;Long Term: Exercising regularly at least 3-5 days a week.       Increase Strength and Stamina Yes       Intervention Develop an individualized exercise prescription for aerobic and resistive training based on initial evaluation findings, risk stratification, comorbidities and participant's personal goals.;Provide advice, education, support and counseling about physical activity/exercise needs.       Expected Outcomes Short Term: Increase workloads from initial exercise prescription for resistance, speed, and METs.;Short Term: Perform resistance training exercises routinely during rehab and add in resistance training at home;Long Term: Improve cardiorespiratory fitness, muscular endurance and strength as measured by increased METs and functional capacity ( )       Able to understand and use rate of perceived exertion (RPE) scale Yes       Intervention Provide education and explanation on how to use RPE scale       Expected Outcomes Short Term: Able to use RPE daily in rehab to express subjective intensity level;Long Term:  Able to use RPE to guide intensity level when exercising independently       Able to understand and use Dyspnea scale Yes        Intervention Provide education and explanation on how to use Dyspnea scale       Expected Outcomes Short Term: Able to use Dyspnea scale daily in rehab to express subjective sense of shortness of breath during exertion;Long Term: Able to use Dyspnea scale to guide intensity level when exercising independently       Knowledge and understanding of Target Heart Rate Range (THRR) Yes       Intervention Provide education and explanation of THRR including how the numbers were predicted and where they are located for reference       Expected Outcomes Short Term: Able to state/look up THRR;Long Term: Able to use THRR to govern intensity when exercising independently;Short Term: Able to use daily as guideline for intensity in rehab       Able to check pulse independently Yes       Intervention Provide education and demonstration on how to check pulse in carotid and radial arteries.;Review the importance of being able to check your own pulse for safety during independent exercise       Expected Outcomes Short Term: Able to explain why pulse checking is important during independent exercise;Long Term: Able to check pulse independently and accurately       Understanding of Exercise Prescription Yes       Intervention Provide education, explanation, and written materials on patient's individual exercise prescription       Expected Outcomes Short Term: Able to explain program exercise prescription;Long Term: Able to explain home exercise prescription to exercise independently          Exercise Goals Re-Evaluation :  Exercise Goals Re-Evaluation     Row Name 03/19/24 1112 03/30/24 1127 04/23/24 1126         Exercise Goal Re-Evaluation   Exercise Goals Review Knowledge and understanding of Target Heart Rate Range (THRR);Able to understand and use rate of perceived exertion (RPE) scale;Understanding of Exercise Prescription Increase Physical Activity;Increase Strength and Stamina;Understanding of Exercise  Prescription Increase Physical Activity;Increase Strength and Stamina;Understanding of Exercise Prescription;Knowledge and understanding of Target Heart Rate Range (THRR)     Comments Reviewed RPE and dyspnea scale, THR and program prescription with pt today.  Pt voiced understanding  and was given a copy of goals to take home. Aldan is doing well in rehab. He has noticed since starting the program that his energy has increased some and also able to do a little more around the house and yard then he use to. He is walking laps around his yard almost everyday and is enjoying getting to do things outside. Kairos is doing well in rehab. He is walking some at home but trying to increase the amount he works at home. It has been a little limited lately since he has had a kidney stone issue.     Expected Outcomes Short: Use RPE daily to regulate intensity.  Long: Follow program prescription in THR. Short: continue to attend rehab regulary and increase work loads   Long: continue to exercise for increased staminia Short: continue to attend rehab regulary and increase work loads. Long: continue to exercise for increased stamina.         Discharge Exercise Prescription (Final Exercise Prescription Changes):  Exercise Prescription Changes - 04/18/24 1300       Response to Exercise   Blood Pressure (Admit) 124/64    Blood Pressure (Exit) 116/72    Heart Rate (Admit) 50 bpm    Heart Rate (Exercise) 99 bpm    Heart Rate (Exit) 66 bpm    Rating of Perceived Exertion (Exercise) 12    Duration Continue with 30 min of aerobic exercise without signs/symptoms of physical distress.    Intensity THRR unchanged      Progression   Progression Continue to progress workloads to maintain intensity without signs/symptoms of physical distress.      Resistance Training   Training Prescription Yes    Weight 4    Reps 10-15      Treadmill   MPH 2    Grade 0    Minutes 15    METs 2.53      NuStep   Level 4    SPM  95    Minutes 15    METs 3          Nutrition:  Target Goals: Understanding of nutrition guidelines, daily intake of sodium 1500mg , cholesterol 200mg , calories 30% from fat and 7% or less from saturated fats, daily to have 5 or more servings of fruits and vegetables.  Biometrics:  Pre Biometrics - 03/15/24 1457       Pre Biometrics   Height 5' 6.5 (1.689 m)    Weight 104.7 kg    Waist Circumference 49 inches    Hip Circumference 45 inches    Waist to Hip Ratio 1.09 %    BMI (Calculated) 36.7    Grip Strength 30 kg           Nutrition Therapy Plan and Nutrition Goals:  Nutrition Therapy & Goals - 03/14/24 1133       Intervention Plan   Intervention Prescribe, educate and counsel regarding individualized specific dietary modifications aiming towards targeted core components such as weight, hypertension, lipid management, diabetes, heart failure and other comorbidities.;Nutrition handout(s) given to patient.    Expected Outcomes Short Term Goal: Understand basic principles of dietary content, such as calories, fat, sodium, cholesterol and nutrients.;Long Term Goal: Adherence to prescribed nutrition plan.          Nutrition Assessments:  MEDIFICTS Score Key: >=70 Need to make dietary changes  40-70 Heart Healthy Diet <= 40 Therapeutic Level Cholesterol Diet  Flowsheet Row CARDIAC REHAB PHASE II ORIENTATION from 03/15/2024 in [facility] CARDIAC  REHABILITATION  Picture Your Plate Total Score on Admission 58   Picture Your Plate Scores: <40 Unhealthy dietary pattern with much room for improvement. 41-50 Dietary pattern unlikely to meet recommendations for good health and room for improvement. 51-60 More healthful dietary pattern, with some room for improvement.  >60 Healthy dietary pattern, although there may be some specific behaviors that could be improved.    Nutrition Goals Re-Evaluation:  Nutrition Goals Re-Evaluation     Row Name 03/30/24 1137 04/23/24  1123           Goals   Nutrition Goal Healthy eating Healthy eating      Comment Kemontae is doing well in rehab. He stated that he is trying to eat healthier. Him and his wife are working on cooking better meals. He said that he eats a lot of salads and enjoys them. They have been doing baked chicken and veggies for dinner and he liked this meal. He drinks mosty water  and lemonade. He said he did not like paying for sodas at resturants so he started to drink water  and then add lemon to it. He does not eat a lot of sweets just every once and a while. Loomis is doing well in rehab. He continues to make healthy choices with his wife as they are cooking more at home.      Expected Outcome Short Term: continue to find meals that are healthy that he enjoys      long term: Continue to eat helathy choices Short: Continue to choose healthy eating habits. Long: Add more variety in his diet.         Nutrition Goals Discharge (Final Nutrition Goals Re-Evaluation):  Nutrition Goals Re-Evaluation - 04/23/24 1123       Goals   Nutrition Goal Healthy eating    Comment Jathan is doing well in rehab. He continues to make healthy choices with his wife as they are cooking more at home.    Expected Outcome Short: Continue to choose healthy eating habits. Long: Add more variety in his diet.          Psychosocial: Target Goals: Acknowledge presence or absence of significant depression and/or stress, maximize coping skills, provide positive support system. Participant is able to verbalize types and ability to use techniques and skills needed for reducing stress and depression.  Initial Review & Psychosocial Screening:  Initial Psych Review & Screening - 03/14/24 1113       Initial Review   Current issues with Current Stress Concerns    Source of Stress Concerns Chronic Illness;Family    Comments Pt's wife has had multiple surgeries on her toes/feet the last few years, and the pt's daughter has terminal  cancer.      Family Dynamics   Good Support System? Yes    Comments Pt's wife and children are his main support system.      Barriers   Psychosocial barriers to participate in program There are no identifiable barriers or psychosocial needs.;The patient should benefit from training in stress management and relaxation.      Screening Interventions   Interventions Encouraged to exercise;To provide support and resources with identified psychosocial needs;Provide feedback about the scores to participant    Expected Outcomes Long Term Goal: Stressors or current issues are controlled or eliminated.;Short Term goal: Utilizing psychosocial counselor, staff and physician to assist with identification of specific Stressors or current issues interfering with healing process. Setting desired goal for each stressor or current issue identified.;Short Term goal:  Identification and review with participant of any Quality of Life or Depression concerns found by scoring the questionnaire.;Long Term goal: The participant improves quality of Life and PHQ9 Scores as seen by post scores and/or verbalization of changes          Quality of Life Scores:  Quality of Life - 03/15/24 1532       Quality of Life   Select Quality of Life      Quality of Life Scores   Health/Function Pre 14.67 %    Socioeconomic Pre 23.79 %    Psych/Spiritual Pre 24 %    Family Pre 17.3 %    GLOBAL Pre 18.85 %         Scores of 19 and below usually indicate a poorer quality of life in these areas.  A difference of  2-3 points is a clinically meaningful difference.  A difference of 2-3 points in the total score of the Quality of Life Index has been associated with significant improvement in overall quality of life, self-image, physical symptoms, and general health in studies assessing change in quality of life.  PHQ-9: Review Flowsheet       03/15/2024 03/15/2013 06/01/2012  Depression screen PHQ 2/9  Decreased Interest 3 0 0   Down, Depressed, Hopeless 3 0 1  PHQ - 2 Score 6 0 1   Altered sleeping 1 - -  Tired, decreased energy 3 - -  Change in appetite 1 - -  Feeling bad or failure about yourself  1 - -  Trouble concentrating 0 - -  Moving slowly or fidgety/restless 2 - -  Suicidal thoughts 0 - -  PHQ-9 Score 14 - -  Difficult doing work/chores Very difficult - -    Details       Data saved with a previous flowsheet row definition        Interpretation of Total Score  Total Score Depression Severity:  1-4 = Minimal depression, 5-9 = Mild depression, 10-14 = Moderate depression, 15-19 = Moderately severe depression, 20-27 = Severe depression   Psychosocial Evaluation and Intervention:  Psychosocial Evaluation - 03/15/24 1500       Psychosocial Evaluation & Interventions   Interventions Stress management education;Relaxation education;Encouraged to exercise with the program and follow exercise prescription    Comments Patient scored 13 on his initial PHQ-9. He attributes his score to his daughter being diagnosed with terminal cancer. He continues to deny any depression or anxiety. We will continue to monitor.    Expected Outcomes Pt will meet both his personal and program goals, and he will continue to have no identifiable psychosocial barriers.    Continue Psychosocial Services  Follow up required by staff          Psychosocial Re-Evaluation:  Psychosocial Re-Evaluation     Row Name 03/30/24 1133 04/23/24 1122           Psychosocial Re-Evaluation   Current issues with None Identified Current Sleep Concerns      Comments Marquay is doing well in rehab. He stated that his stress levels have been better since exercising and knowing he can get back to doing things he enjoyed. He does not have a lot of stress anymore and just lets it roll off his shoulders. He does have concerns about medical issues with his kidneys but is not letting this get to him Nygel is doing well in rehab. He identifies no  stressors. He is having trouble sleeping right now due to a  kidney stone issue. He is going to go to the urologist after here to see if they can fit him in.      Expected Outcomes Short: continue to monitor stress levels   long term: continue to have an outlet for stress and exercise for improved happyniess Short: Continue to monitor stress levels and keep them low. Long: Get the kidney stone issue worked out.      Interventions Encouraged to attend Cardiac Rehabilitation for the exercise Encouraged to attend Cardiac Rehabilitation for the exercise      Continue Psychosocial Services  Follow up required by staff Follow up required by staff         Psychosocial Discharge (Final Psychosocial Re-Evaluation):  Psychosocial Re-Evaluation - 04/23/24 1122       Psychosocial Re-Evaluation   Current issues with Current Sleep Concerns    Comments Mckyle is doing well in rehab. He identifies no stressors. He is having trouble sleeping right now due to a kidney stone issue. He is going to go to the urologist after here to see if they can fit him in.    Expected Outcomes Short: Continue to monitor stress levels and keep them low. Long: Get the kidney stone issue worked out.    Interventions Encouraged to attend Cardiac Rehabilitation for the exercise    Continue Psychosocial Services  Follow up required by staff          Vocational Rehabilitation: Provide vocational rehab assistance to qualifying candidates.   Vocational Rehab Evaluation & Intervention:  Vocational Rehab - 03/14/24 1118       Initial Vocational Rehab Evaluation & Intervention   Assessment shows need for Vocational Rehabilitation No      Vocational Rehab Re-Evaulation   Comments The pt is retired and has no plans to return to work.          Education: Education Goals: Education classes will be provided on a weekly basis, covering required topics. Participant will state understanding/return demonstration of topics  presented.  Learning Barriers/Preferences:  Learning Barriers/Preferences - 03/14/24 1117       Learning Barriers/Preferences   Learning Barriers Exercise Concerns    Learning Preferences Written Material          Education Topics: Hypertension, Hypertension Reduction -Define heart disease and high blood pressure. Discus how high blood pressure affects the body and ways to reduce high blood pressure. Flowsheet Row CARDIAC REHAB PHASE II EXERCISE from 05/02/2024 in [place] CARDIAC REHABILITATION  Date 04/11/24  Educator jh  Instruction Review Code 1- Verbalizes Understanding    Exercise and Your Heart -Discuss why it is important to exercise, the FITT principles of exercise, normal and abnormal responses to exercise, and how to exercise safely.   Angina -Discuss definition of angina, causes of angina, treatment of angina, and how to decrease risk of having angina. Flowsheet Row CARDIAC REHAB PHASE II EXERCISE from 05/02/2024 in [place] CARDIAC REHABILITATION  Date 05/02/24  Educator [facility]  Instruction Review Code 1- Verbalizes Understanding    Cardiac Medications -Review what the following cardiac medications are used for, how they affect the body, and side effects that may occur when taking the medications.  Medications include Aspirin , Beta blockers, calcium  channel blockers, ACE Inhibitors, angiotensin receptor blockers, diuretics, digoxin, and antihyperlipidemics. Flowsheet Row CARDIAC REHAB PHASE II EXERCISE from 05/02/2024 in [place] CARDIAC REHABILITATION  Date 04/18/24  Educator jh  Instruction Review Code 1- Verbalizes Understanding    Congestive Heart Failure -Discuss the definition of CHF,  how to live with CHF, the signs and symptoms of CHF, and how keep track of weight and sodium intake.   Heart Disease and Intimacy -Discus the effect sexual activity has on the heart, how changes occur during intimacy as we age, and safety during sexual  activity.   Smoking Cessation / COPD -Discuss different methods to quit smoking, the health benefits of quitting smoking, and the definition of COPD.   Nutrition I: Fats -Discuss the types of cholesterol, what cholesterol does to the heart, and how cholesterol levels can be controlled. Flowsheet Row CARDIAC REHAB PHASE II EXERCISE from 05/02/2024 in [place] CARDIAC REHABILITATION  Date 03/28/24  Educator [facility]  Instruction Review Code 1- Verbalizes Understanding    Nutrition II: Labels -Discuss the different components of food labels and how to read food label   Heart Parts/Heart Disease and PAD -Discuss the anatomy of the heart, the pathway of blood circulation through the heart, and these are affected by heart disease.   Stress I: Signs and Symptoms -Discuss the causes of stress, how stress may lead to anxiety and depression, and ways to limit stress. Flowsheet Row CARDIAC REHAB PHASE II EXERCISE from 05/02/2024 in [place] CARDIAC REHABILITATION  Date 04/04/24  Educator HB  Instruction Review Code 1- Verbalizes Understanding    Stress II: Relaxation -Discuss different types of relaxation techniques to limit stress.   Warning Signs of Stroke / TIA -Discuss definition of a stroke, what the signs and symptoms are of a stroke, and how to identify when someone is having stroke.   Knowledge Questionnaire Score:  Knowledge Questionnaire Score - 03/15/24 1457       Knowledge Questionnaire Score   Pre Score 22/24          Core Components/Risk Factors/Patient Goals at Admission:  Personal Goals and Risk Factors at Admission - 03/14/24 1121       Core Components/Risk Factors/Patient Goals on Admission    Weight Management Yes    Intervention Weight Management: Provide education and appropriate resources to help participant work on and attain dietary goals.;Weight Management: Develop a combined nutrition and exercise program designed to reach desired caloric intake,  while maintaining appropriate intake of nutrient and fiber, sodium and fats, and appropriate energy expenditure required for the weight goal.    Expected Outcomes Long Term: Adherence to nutrition and physical activity/exercise program aimed toward attainment of established weight goal;Short Term: Continue to assess and modify interventions until short term weight is achieved;Weight Maintenance: Understanding of the daily nutrition guidelines, which includes 25-35% calories from fat, 7% or less cal from saturated fats, less than 200mg  cholesterol, less than 1.5gm of sodium, & 5 or more servings of fruits and vegetables daily    Improve shortness of breath with ADL's Yes    Intervention Provide education, individualized exercise plan and daily activity instruction to help decrease symptoms of SOB with activities of daily living.    Expected Outcomes Short Term: Improve cardiorespiratory fitness to achieve a reduction of symptoms when performing ADLs;Long Term: Be able to perform more ADLs without symptoms or delay the onset of symptoms    Diabetes Yes    Intervention Provide education about signs/symptoms and action to take for hypo/hyperglycemia.;Provide education about proper nutrition, including hydration, and aerobic/resistive exercise prescription along with prescribed medications to achieve blood glucose in normal ranges: Fasting glucose 65-99 mg/dL    Expected Outcomes Short Term: Participant verbalizes understanding of the signs/symptoms and immediate care of hyper/hypoglycemia, proper foot care and importance  of medication, aerobic/resistive exercise and nutrition plan for blood glucose control.;Long Term: Attainment of HbA1C < 7%.    Hypertension Yes    Intervention Provide education on lifestyle modifcations including regular physical activity/exercise, weight management, moderate sodium restriction and increased consumption of fresh fruit, vegetables, and low fat dairy, alcohol moderation, and  smoking cessation.;Monitor prescription use compliance.    Expected Outcomes Short Term: Continued assessment and intervention until BP is < 140/85mm HG in hypertensive participants. < 130/16mm HG in hypertensive participants with diabetes, heart failure or chronic kidney disease.;Long Term: Maintenance of blood pressure at goal levels.    Lipids Yes    Intervention Provide education and support for participant on nutrition & aerobic/resistive exercise along with prescribed medications to achieve LDL 70mg , HDL >40mg .    Expected Outcomes Short Term: Participant states understanding of desired cholesterol values and is compliant with medications prescribed. Participant is following exercise prescription and nutrition guidelines.;Long Term: Cholesterol controlled with medications as prescribed, with individualized exercise RX and with personalized nutrition plan. Value goals: LDL < 70mg , HDL > 40 mg.    Stress Yes    Intervention Refer participants experiencing significant psychosocial distress to appropriate mental health specialists for further evaluation and treatment. When possible, include family members and significant others in education/counseling sessions.;Offer individual and/or small group education and counseling on adjustment to heart disease, stress management and health-related lifestyle change. Teach and support self-help strategies.    Expected Outcomes Short Term: Participant demonstrates changes in health-related behavior, relaxation and other stress management skills, ability to obtain effective social support, and compliance with psychotropic medications if prescribed.;Long Term: Emotional wellbeing is indicated by absence of clinically significant psychosocial distress or social isolation.    Personal Goal Other Yes    Intervention Pt will attend CR 3 days a week.    Expected Outcomes Pt will meet both his personal and program goals.          Core Components/Risk Factors/Patient  Goals Review:   Goals and Risk Factor Review     Row Name 03/30/24 1142 04/23/24 1125           Core Components/Risk Factors/Patient Goals Review   Personal Goals Review Weight Management/Obesity Weight Management/Obesity      Review Julen is doing well in rehab. He is enjoying coming to rehab and exercising. He does not check his blood sugar at home, we checked it for his first couple visits due to his medication that he is one that lowers blood surgar. His values were WNL for exercise. He does check his blood pressure at home and trys  to do it daily. He is trying to eat healthy meals to help with weight managment. Swayze continues to check his BP at home. He says it runs with the numbers we have here. He isn't checking his blood sugars unless he feels off.      Expected Outcomes Short: comtinue to eat healthy for BS and weight manaement    long term: continue to check blood pressure daily Short: Continue to attend rehab. Long: Continue to monitor blood pressures and blood sugars at home.         Core Components/Risk Factors/Patient Goals at Discharge (Final Review):   Goals and Risk Factor Review - 04/23/24 1125       Core Components/Risk Factors/Patient Goals Review   Personal Goals Review Weight Management/Obesity    Review Royale continues to check his BP at home. He says it runs with the numbers we have here.  He isn't checking his blood sugars unless he feels off.    Expected Outcomes Short: Continue to attend rehab. Long: Continue to monitor blood pressures and blood sugars at home.          ITP Comments:  ITP Comments     Row Name 03/15/24 1523 03/19/24 1112 04/11/24 1151 05/09/24 0848     ITP Comments Patient arrived for 1st visit/orientation/education at 1330. Patient was referred to CR by Dr. Jasmine MesiAndree Kayser due to NSTEMI/Status post stent placement. . During orientation advised patient on arrival and appointment times what to wear, what to do before, during and after  exercise. Reviewed attendance and class policy.  Pt is scheduled to return Cardiac Rehab on 03/19/24 at 1100. Pt was advised to come to class 15 minutes before class starts.  Discussed RPE/Dpysnea scales. Patient participated in warm up stretches. Patient was able to complete 6 minute walk test.  Telemetry:NSR. Patient was measured for the equipment. Discussed equipment safety with patient. Took patient pre-anthropometric measurements. Patient finished visit at 1430. First full day of exercise!  Patient was oriented to gym and equipment including functions, settings, policies, and procedures.  Patient's individual exercise prescription and treatment plan were reviewed.  All starting workloads were established based on the results of the 6 minute walk test done at initial orientation visit.  The plan for exercise progression was also introduced and progression will be customized based on patient's performance and goals. 30 day review completed. ITP sent to Dr. Armida Lander, Medical Director of Cardiac Rehab. Continue with ITP unless changes are made by physician. 30 day review completed. ITP sent to Dr. Armida Lander, Medical Director of Cardiac Rehab. Continue with ITP unless changes are made by physician.       Comments: 30 Day Review

## 2024-05-11 ENCOUNTER — Encounter (HOSPITAL_COMMUNITY)
Admission: RE | Admit: 2024-05-11 | Discharge: 2024-05-11 | Disposition: A | Discharge status: Home / Self Care | Source: Ambulatory Visit | Attending: Cardiovascular Disease | Admitting: Cardiovascular Disease

## 2024-05-11 DIAGNOSIS — Z955 Presence of coronary angioplasty implant and graft: Secondary | ICD-10-CM

## 2024-05-11 DIAGNOSIS — I214 Non-ST elevation (NSTEMI) myocardial infarction: Secondary | ICD-10-CM | POA: Diagnosis not present

## 2024-05-11 NOTE — Progress Notes (Signed)
 Daily Session Note  Patient Details  Name: Kyle Lambert MRN: [id number] Date of Birth: 1950/11/28 Referring Provider:   Flowsheet Row CARDIAC REHAB PHASE II ORIENTATION from 03/15/2024 in [facility]AC REH[facility]ILIT[facility]  Referring Provider Jackquelyn Mass MD    Encounter Date: 05/11/2024  Check In:  Session Check In - 05/11/24 1049       Check-In   Supervising physician immediately available to respond to emergencies See telemetry face sheet for immediately available MD    Location [facility]    Staff Present Clotilda Danish, BS, Exercise Physiologist;Other;Brittany Annette Barters, BSN, RN, WTA-C    Virtual Visit No    Medication changes reported     No    Fall or balance concerns reported    No    Tobacco Cessation No Change    Warm-up and Cool-down Performed on first and last piece of equipment    Resistance Training Performed Yes    VAD Patient? No    PAD/SET Patient? No      Pain Assessment   Currently in Pain? No/denies    Pain Score 0-No pain    Multiple Pain Sites No          Capillary Blood Glucose: No results found for this or any previous visit (from the past 24 hours).    Social History   Tobacco Use  Smoking Status Never  Smokeless Tobacco Never    Goals Met:  Independence with exercise equipment Exercise tolerated well No report of concerns or symptoms today Strength training completed today  Goals Unmet:  Not Applicable  Comments: Pt able to follow exercise prescription today without complaint.  Will continue to monitor for progression.

## 2024-05-14 ENCOUNTER — Encounter (HOSPITAL_COMMUNITY)
Admission: RE | Admit: 2024-05-14 | Discharge: 2024-05-14 | Disposition: A | Discharge status: Home / Self Care | Source: Ambulatory Visit | Attending: Cardiovascular Disease

## 2024-05-14 DIAGNOSIS — Z955 Presence of coronary angioplasty implant and graft: Secondary | ICD-10-CM

## 2024-05-14 DIAGNOSIS — I214 Non-ST elevation (NSTEMI) myocardial infarction: Secondary | ICD-10-CM | POA: Diagnosis not present

## 2024-05-14 NOTE — Progress Notes (Signed)
 Daily Session Note  Patient Details  Name: Kyle Lambert MRN: [id number] Date of Birth: [birth year] Referring Provider:   Flowsheet Row CARDIAC REHAB PHASE II ORIENTATION from 03/15/2024 in [facility] CARDIAC REHABILITATION  Referring Provider Barbaraann Kotyk MD    Encounter Date: 05/14/2024  Check In:  Session Check In - 05/14/24 1100       Check-In   Supervising physician immediately available to respond to emergencies See telemetry face sheet for immediately available MD    Location [facility]    Staff Present Powell Benders, BS, Exercise Physiologist;Brittany Jackquline, BSN, RN, Estrella Daring, BS, RRT, CPFT    Virtual Visit No    Medication changes reported     No    Fall or balance concerns reported    No    Tobacco Cessation No Change    Warm-up and Cool-down Performed on first and last piece of equipment    Resistance Training Performed Yes    VAD Patient? No    PAD/SET Patient? No      Pain Assessment   Currently in Pain? No/denies    Pain Score 0-No pain    Multiple Pain Sites No          Capillary Blood Glucose: No results found for this or any previous visit (from the past 24 hours).    Social History   Tobacco Use  Smoking Status Never  Smokeless Tobacco Never    Goals Met:  Independence with exercise equipment Exercise tolerated well No report of concerns or symptoms today Strength training completed today  Goals Unmet:  Not Applicable  Comments: Pt able to follow exercise prescription today without complaint.  Will continue to monitor for progression.

## 2024-05-16 ENCOUNTER — Encounter (HOSPITAL_COMMUNITY)
Admission: RE | Admit: 2024-05-16 | Discharge: 2024-05-16 | Disposition: A | Discharge status: Home / Self Care | Source: Ambulatory Visit | Attending: Cardiovascular Disease

## 2024-05-16 DIAGNOSIS — I214 Non-ST elevation (NSTEMI) myocardial infarction: Secondary | ICD-10-CM | POA: Diagnosis not present

## 2024-05-16 DIAGNOSIS — Z955 Presence of coronary angioplasty implant and graft: Secondary | ICD-10-CM

## 2024-05-16 NOTE — Progress Notes (Signed)
 Daily Session Note  Patient Details  Name: Kyle Lambert MRN: [id number] Date of Birth: 1951/03/08 Referring Provider:   Flowsheet Row CARDIAC REHAB PHASE II ORIENTATION from 03/15/2024 in [facility] CARDIAC REHABILITATION  Referring Provider Barbaraann Kotyk MD    Encounter Date: 05/16/2024  Check In:  Session Check In - 05/16/24 1025       Check-In   Supervising physician immediately available to respond to emergencies See telemetry face sheet for immediately available MD    Location [facility]    Staff Present Powell Benders, BS, Exercise Physiologist;Marissa Weaver Jackquline, BSN, RN, WTA-C    Virtual Visit No    Medication changes reported     No    Fall or balance concerns reported    No    Warm-up and Cool-down Performed on first and last piece of equipment    Resistance Training Performed Yes    VAD Patient? No    PAD/SET Patient? No      Pain Assessment   Currently in Pain? Yes    Pain Score 6     Pain Location Back    Pain Orientation Lower    Pain Descriptors / Indicators Spasm    Pain Type Chronic pain    Pain Onset In the past 7 days    Pain Frequency Intermittent          Capillary Blood Glucose: No results found for this or any previous visit (from the past 24 hours).    Social History   Tobacco Use  Smoking Status Never  Smokeless Tobacco Never    Goals Met:  Independence with exercise equipment Exercise tolerated well No report of concerns or symptoms today Strength training completed today  Goals Unmet:  Not Applicable  Comments: Pt able to follow exercise prescription today without complaint.  Will continue to monitor for progression.

## 2024-05-17 ENCOUNTER — Telehealth: Payer: Self-pay

## 2024-05-17 NOTE — Telephone Encounter (Signed)
 Voicemail received from wife concerning patient may have a uti. Message left offering appointment with Np tomorrow. Requested call back.

## 2024-05-18 ENCOUNTER — Ambulatory Visit: Admitting: Urology

## 2024-05-18 ENCOUNTER — Encounter: Payer: Self-pay | Admitting: Urology

## 2024-05-18 ENCOUNTER — Encounter (HOSPITAL_COMMUNITY)
Admission: RE | Admit: 2024-05-18 | Discharge: 2024-05-18 | Disposition: A | Discharge status: Home / Self Care | Source: Ambulatory Visit | Attending: Cardiovascular Disease

## 2024-05-18 ENCOUNTER — Telehealth: Payer: Self-pay

## 2024-05-18 ENCOUNTER — Ambulatory Visit (HOSPITAL_COMMUNITY)
Admission: RE | Admit: 2024-05-18 | Discharge: 2024-05-18 | Disposition: A | Discharge status: Home / Self Care | Source: Ambulatory Visit | Attending: Urology | Admitting: Urology

## 2024-05-18 VITALS — BP 151/83 | HR 69

## 2024-05-18 DIAGNOSIS — Z955 Presence of coronary angioplasty implant and graft: Secondary | ICD-10-CM

## 2024-05-18 DIAGNOSIS — R112 Nausea with vomiting, unspecified: Secondary | ICD-10-CM

## 2024-05-18 DIAGNOSIS — N401 Enlarged prostate with lower urinary tract symptoms: Secondary | ICD-10-CM

## 2024-05-18 DIAGNOSIS — Z87440 Personal history of urinary (tract) infections: Secondary | ICD-10-CM | POA: Diagnosis not present

## 2024-05-18 DIAGNOSIS — N200 Calculus of kidney: Secondary | ICD-10-CM | POA: Diagnosis not present

## 2024-05-18 DIAGNOSIS — R109 Unspecified abdominal pain: Secondary | ICD-10-CM

## 2024-05-18 DIAGNOSIS — N390 Urinary tract infection, site not specified: Secondary | ICD-10-CM | POA: Diagnosis not present

## 2024-05-18 DIAGNOSIS — R82998 Other abnormal findings in urine: Secondary | ICD-10-CM | POA: Diagnosis not present

## 2024-05-18 DIAGNOSIS — I214 Non-ST elevation (NSTEMI) myocardial infarction: Secondary | ICD-10-CM | POA: Diagnosis not present

## 2024-05-18 DIAGNOSIS — R399 Unspecified symptoms and signs involving the genitourinary system: Secondary | ICD-10-CM | POA: Diagnosis not present

## 2024-05-18 DIAGNOSIS — R110 Nausea: Secondary | ICD-10-CM | POA: Diagnosis not present

## 2024-05-18 DIAGNOSIS — Z8551 Personal history of malignant neoplasm of bladder: Secondary | ICD-10-CM | POA: Diagnosis not present

## 2024-05-18 DIAGNOSIS — N138 Other obstructive and reflux uropathy: Secondary | ICD-10-CM

## 2024-05-18 DIAGNOSIS — C672 Malignant neoplasm of lateral wall of bladder: Secondary | ICD-10-CM

## 2024-05-18 DIAGNOSIS — Q631 Lobulated, fused and horseshoe kidney: Secondary | ICD-10-CM | POA: Diagnosis not present

## 2024-05-18 LAB — URINALYSIS, ROUTINE W REFLEX MICROSCOPIC
Bilirubin, UA: NEGATIVE
Glucose, UA: NEGATIVE
Nitrite, UA: POSITIVE — AB
Specific Gravity, UA: 1.03 (ref 1.005–1.030)
Urobilinogen, Ur: 2 mg/dL — ABNORMAL HIGH (ref 0.2–1.0)
pH, UA: 6 (ref 5.0–7.5)

## 2024-05-18 LAB — MICROSCOPIC EXAMINATION: RBC, Urine: >30 /HPF — AB (ref 0–2)

## 2024-05-18 MED ORDER — NITROFURANTOIN MONOHYD MACRO 100 MG PO CAPS: 100.0000 mg | ORAL_CAPSULE | Freq: Two times a day (BID) | ORAL | 0 refills | Status: AC

## 2024-05-18 MED ORDER — HYDROCODONE-ACETAMINOPHEN 5-325 MG PO TABS
1.0000 | ORAL_TABLET | Freq: Four times a day (QID) | ORAL | 0 refills | Status: AC | PRN
Start: 2024-05-18 — End: 2024-05-23

## 2024-05-18 MED ORDER — ONDANSETRON 4 MG PO TBDP: 4.0000 mg | ORAL_TABLET | Freq: Three times a day (TID) | ORAL | 1 refills | Status: AC | PRN

## 2024-05-18 NOTE — Telephone Encounter (Signed)
-----   Message from Lauraine JAYSON Oz sent at 05/18/2024 11:23 AM EDT ----- Please let patient know that I looked at today's KUB images and don't see any definite obstructing ureteral stone. Awaiting radiology read.

## 2024-05-18 NOTE — Progress Notes (Signed)
 Bladder Scan completed today.  Patient can void prior to the bladder scan. Bladder scan result: 140  Performed By: Exie T. CMA  Additional notes-

## 2024-05-18 NOTE — Telephone Encounter (Signed)
 Called pt to relay message from NP pt voiced his understanding

## 2024-05-18 NOTE — Progress Notes (Signed)
 Daily Session Note  Patient Details  Name: Kyle Lambert MRN: [id number] Date of Birth: Aug 29, 1951 Referring Provider:   Flowsheet Row CARDIAC REHAB PHASE II ORIENTATION from 03/15/2024 in [facility] CARDIAC REHAB[facility]IT[facility]  Referring Provider Barbaraann Kotyk MD    Encounter Date: 05/18/2024  Check In:  Session Check In - 05/18/24 1045       Check-In   Supervising physician immediately available to respond to emergencies See telemetry face sheet for immediately available MD    Location [facility]    Staff Present Powell Benders, BS, Exercise Physiologist;Judy Goodenow Vicci, RN, BSN;Laureen Brown, BS, RRT, CPFT    Virtual Visit No    Medication changes reported     No    Fall or balance concerns reported    No    Warm-up and Cool-down Performed on first and last piece of equipment    Resistance Training Performed Yes    VAD Patient? No    PAD/SET Patient? No      Pain Assessment   Currently in Pain? No/denies    Pain Score 0-No pain    Multiple Pain Sites No          Capillary Blood Glucose: Results for orders placed or performed in visit on 05/18/24 (from the past 24 hours)  Urinalysis, Routine w reflex microscopic     Status: Abnormal   Collection Time: 05/18/24  8:52 AM  Result Value Ref Range   Specific Gravity, UA 1.030 1.005 - 1.030   pH, UA 6.0 5.0 - 7.5   Color, UA Red (A) Yellow   Appearance Ur Cloudy (A) Clear   Leukocytes,UA 1+ (A) Negative   Protein,UA 2+ (A) Negative/Trace   Glucose, UA Negative Negative   Ketones, UA Trace (A) Negative   RBC, UA 3+ (A) Negative   Bilirubin, UA Negative Negative   Urobilinogen, Ur 2.0 (H) 0.2 - 1.0 mg/dL   Nitrite, UA Positive (A) Negative   Microscopic Examination See below:    Narrative   Performed at:  [address] - [facility] [address], [place], [place]  [id number] Lab Director: Cherlyn Ore MT, Phone:  [phone]  Microscopic Examination     Status: Abnormal   Collection Time: 05/18/24   8:52 AM   Urine  Result Value Ref Range   WBC, UA 11-30 (A) 0 - 5 /hpf   RBC, Urine >30 (A) 0 - 2 /hpf   Epithelial Cells (non renal) 0-10 0 - 10 /hpf   Bacteria, UA Many (A) None seen/Few   Narrative   Performed at:  [address] - [facility] [place] [place] [address], [place], [place]  [id number] Lab Director: Cherlyn Ore MT, Phone:  [phone]      Social History   Tobacco Use  Smoking Status Never  Smokeless Tobacco Never    Goals Met:  Independence with exercise equipment Exercise tolerated well No report of concerns or symptoms today Strength training completed today  Goals Unmet:  Not Applicable  Comments: Pt able to follow exercise prescription today without complaint.  Will continue to monitor for progression.

## 2024-05-18 NOTE — Progress Notes (Signed)
 Name: Kyle Lambert DOB: 14-Mar-1951 MRN: [id number]  History of Present Illness: Kyle Lambert is a 73 y.o. male who presents today for follow up visit at [facility] [place]. Relevant History includes: 1. Bladder cancer. - 04/07/2020: Underwent cystoscopy and bladder biopsy with fulguration by Dr. Sherrilee concomitantly at time of left ureteroscopic stone manipulation. Pathology: Early Non-invasive low grade papillary urothelial carcinoma. - 06/22/2023: No acute findings on surveillance cystoscopy by Dr. Sherrilee.  2. BPH with LUTS (frequency, nocturia, straining, hesitancy). - Taking Flomax  2x/day.  - Previously tried Detrol  and Toviaz  for LUTS. 3. Recurrent UTIs. See culture history below. 4. Kidney stones.  - Has had multiple prior stone procedures.  - 03/25/2023: CT stone study showed stable punctate right lower pole nephrolithiasis. 5. Horseshoe kidney. 6. Multiple posterior and left lateral bladder wall diverticula. Per CT stone study on 03/25/2023.   Urine culture results in past 12 months: - 06/15/2023: Positive for Klebsiella pneumoniae - 09/20/2023: Positive for E. Coli - 11/01/2023: Positive for Klebsiella pneumoniae  At last visit with Dr. Sherrilee on 01/04/2024: > No acute findings on surveillance cystoscopy > Plan was:  - Follow up in 6 months for cystoscopy - Trial Rapaflo  8 mg daily.  Since last visit: > CT stone on 05/01/2024 showed:  - Horseshoe configuration of kidneys. - Small nonobstructive right renal calculus. - Stable bladder diverticula compared to prior exam. - Stable mild prostatic enlargement.  Today: He reports that for the past week he's had bilateral flank and low back pain (L > R) along with dysuria, increased urinary urgency, frequency. Also had nausea and vomiting 2 days ago which has since resolved. Denies abdominal pain, fevers, gross hematuria, or increased difficulty voiding.   Medications: Current Outpatient Medications  Medication  Sig Dispense Refill   amLODipine  (NORVASC ) 5 MG tablet TAKE 1 TABLET DAILY 90 tablet 0   apixaban  (ELIQUIS ) 5 MG TABS tablet Take 1 tablet (5 mg total) by mouth 2 (two) times daily. 60 tablet 12   aspirin  EC 81 MG tablet Take 81 mg by mouth daily.     atorvastatin  (LIPITOR ) 40 MG tablet Take 40 mg by mouth at bedtime.      clopidogrel  (PLAVIX ) 75 MG tablet Take 1 tablet by mouth once daily 90 tablet 3   cyclobenzaprine  (FLEXERIL ) 10 MG tablet Take 10 mg by mouth daily as needed for muscle spasms.     Ferrous Sulfate  (IRON ) 325 (65 Fe) MG TABS Take 1 tablet (325 mg total) by mouth 2 (two) times daily. 60 tablet 1   HYDROcodone-acetaminophen  (NORCO/VICODIN) 5-325 MG tablet Take 1 tablet by mouth every 6 (six) hours as needed for up to 5 days for moderate pain (pain score 4-6) or severe pain (pain score 7-10). 15 tablet 0   isosorbide  mononitrate (IMDUR ) 120 MG 24 hr tablet Take 1 tablet (120 mg total) by mouth daily. 90 tablet 3   meclizine (ANTIVERT) 25 MG tablet Take 25 mg by mouth 3 (three) times daily as needed for dizziness.     metFORMIN (GLUCOPHAGE) 1000 MG tablet Take 1,000 mg by mouth 2 (two) times daily.     metoprolol  tartrate (LOPRESSOR ) 25 MG tablet TAKE 1 TABLET TWICE A DAY (Patient taking differently: Take 25 mg by mouth 2 (two) times daily.) 60 tablet 0   Multiple Vitamins-Minerals (PRESERVISION AREDS 2 PO) Take 1 tablet by mouth in the morning and at bedtime.     nitrofurantoin , macrocrystal-monohydrate, (MACROBID ) 100 MG capsule Take 1 capsule (100 mg  total) by mouth 2 (two) times daily for 7 days. 14 capsule 0   nitroGLYCERIN  (NITROSTAT ) 0.4 MG SL tablet Place 1 tablet (0.4 mg total) under the tongue every 5 (five) minutes x 3 doses as needed for chest pain. 25 tablet 3   ondansetron  (ZOFRAN -ODT) 4 MG disintegrating tablet Take 1 tablet (4 mg total) by mouth every 8 (eight) hours as needed for nausea or vomiting. 20 tablet 1   pantoprazole  (PROTONIX ) 40 MG tablet TAKE 1 TABLET BY  MOUTH ONCE DAILY --  NEEDS  APPOINTMENT  FOR  FURTHER  REFILLS (Patient taking differently: Take 40 mg by mouth daily.) 15 tablet 0   silodosin  (RAPAFLO ) 8 MG CAPS capsule Take 8 mg by mouth daily with breakfast.     No current facility-administered medications for this visit.    Allergies: No Known Allergies  Past Medical History:  Diagnosis Date   Angina pectoris (HCC)    Arthritis    Bladder cancer (HCC) 03/2020   Bladder carcinoma (HCC)    BMI 35.0-35.9,adult    CAD (coronary artery disease)    a. 03/2012 Cath: LAD 80, RCA 80, nl EF;  b. PCI of RCA w 3.0x5mm Promus DES, FFR of LAD was nl @ 0.85 ->Med Rx.;  c. LHC 02/15/13: proximal LAD 40-50%, mid LAD 95%, small D1 95-99%, mid CFX 30-40%, proximal RCA stent patent with ostial 20-30% prior to the stent, ostial PDA 50%, EF 55-65%. PCI:  Promus DES to the mid LAD and POBA to the proximal D1    Chest pain    COVID    DM (diabetes mellitus) (HCC)    Elevated TSH    Headache    History of angina    Hypercholesterolemia    Hyperlipidemia    Hypertension    Hypotension    Kidney stone    Knee pain    Low back pain    Morbid obesity (HCC)    OA (osteoarthritis)    Obesity    OSA (obstructive sleep apnea)    Sepsis (HCC)    Skin lesion    Tension headache    Tinnitus, bilateral    Umbilical hernia    UTI (urinary tract infection)    Past Surgical History:  Procedure Laterality Date   CORONARY ANGIOPLASTY WITH STENT PLACEMENT  04/24/12   DES-RCA   CORONARY ANGIOPLASTY WITH STENT PLACEMENT  02/15/13   Patent RCA stent, diffuse nonobstructive R PDA, LCx disease, 99% mid LAD stenosis s/p DES-mid LAD; LVEF 60%   CORONARY LITHOTRIPSY N/A 02/17/2024   Procedure: CORONARY LITHOTRIPSY;  Surgeon: Verlin Lonni BIRCH, MD;  Location: [facility];  Service: Cardiovascular;  Laterality: N/A;   CORONARY STENT INTERVENTION N/A 02/17/2024   Procedure: CORONARY STENT INTERVENTION;  Surgeon: Verlin Lonni BIRCH, MD;  Location: [facility]  INV[facility] CV LAB;  Service: Cardiovascular;  Laterality: N/A;   CORONARY STENT INTERVENTION N/A 02/23/2024   Procedure: CORONARY STENT INTERVENTION;  Surgeon: Verlin Lonni BIRCH, MD;  Location: [facility];  Service: Cardiovascular;  Laterality: N/A;  OM1   CORONARY STENT PLACEMENT     CYSTOSCOPY W/ RETROGRADES  04/07/2020   Procedure: CYSTOSCOPY WITH LEFT RETROGRADE PYELOGRAM;  Surgeon: Sherrilee Belvie CROME, MD;  Location: [facility];  Service: Urology;;   CYSTOSCOPY WITH BIOPSY  04/07/2020   Procedure: CYSTOSCOPY WITH BLADDER BIOPSY;  Surgeon: Sherrilee Belvie CROME, MD;  Location: [facility];  Service: Urology;;   CYSTOSCOPY WITH FULGERATION  04/07/2020   Procedure: CYSTOSCOPY WITH FULGERATION;  Surgeon: Sherrilee Belvie CROME, MD;  Location: [facility];  Service: Urology;;   CYSTOSCOPY/URETEROSCOPY/HOLMIUM LASER/STENT PLACEMENT Left 04/07/2020   Procedure: CYSTOSCOPY/LEFT URETEROSCOPY/HOLMIUM LASER LITHOTRIPSY/LEFT URETERAL STENT PLACEMENT;  Surgeon: Sherrilee Belvie CROME, MD;  Location: [facility];  Service: Urology;  Laterality: Left;   HERNIA REPAIR Left inguinal   LEFT HEART CATH AND CORONARY ANGIOGRAPHY N/A 01/31/2019   Procedure: LEFT HEART CATH AND CORONARY ANGIOGRAPHY;  Surgeon: Wonda Sharper, MD;  [facility]: [facility] INVASIVE CV LAB;  Service: Cardiovascular;  Laterality: N/A;   LEFT HEART CATH AND CORONARY ANGIOGRAPHY N/A 02/17/2024   Procedure: LEFT HEART CATH AND CORONARY ANGIOGRAPHY;  Surgeon: Verlin Lonni BIRCH, MD;  Location: [facility];  Service: Cardiovascular;  Laterality: N/A;   LEFT HEART CATH AND CORONARY ANGIOGRAPHY N/A 02/23/2024   Procedure: LEFT HEART CATH AND CORONARY ANGIOGRAPHY;  Surgeon: Verlin Lonni BIRCH, MD;  Location: [facility];  Service: Cardiovascular;  Laterality: N/A;   LEFT HEART CATHETERIZATION WITH CORONARY ANGIOGRAM N/A 04/21/2012   Procedure: LEFT HEART CATHETERIZATION WITH CORONARY ANGIOGRAM;  Surgeon: Debby BIRCH Como, MD;  Location: [facility] CATH LAB;   Service: Cardiovascular;  Laterality: N/A;   LEFT HEART CATHETERIZATION WITH CORONARY ANGIOGRAM N/A 02/15/2013   Procedure: LEFT HEART CATHETERIZATION WITH CORONARY ANGIOGRAM;  Surgeon: Sharper Wonda, MD;  Location: [facility] CATH LAB;  Service: Cardiovascular;  Laterality: N/A;   LITHOTRIPSY     PERCUTANEOUS CORONARY STENT INTERVENTION (PCI-S) N/A 04/24/2012   Procedure: PERCUTANEOUS CORONARY STENT INTERVENTION (PCI-S);  Surgeon: Sharper Wonda, MD;  Location: [facility] CATH LAB;  Service: Cardiovascular;  Laterality: N/A;   PERCUTANEOUS CORONARY STENT INTERVENTION (PCI-S)  02/15/2013   Procedure: PERCUTANEOUS CORONARY STENT INTERVENTION (PCI-S);  Surgeon: Sharper Wonda, MD;  Location: [facility] CATH LAB;  Service: Cardiovascular;;   Family History  Problem Relation Age of Onset   Cancer Mother    Rheum arthritis Mother    Lung cancer Father    Stroke Brother    Social History   Socioeconomic History   Marital status: Married    Spouse name: [name]   Number of children: 1   Years of education: Not on file   Highest education level: Not on file  Occupational History   Not on file  Tobacco Use   Smoking status: Never   Smokeless tobacco: Never  Vaping Use   Vaping status: Never Used  Substance and Sexual Activity   Alcohol use: No    Comment:  quit along time ago   Drug use: No   Sexual activity: Not Currently  Other Topics Concern   Not on file  Social History Narrative   Lives with wife, dgtr, 2 grandsons live with   Social Drivers of Health   Financial Resource Strain: Not on file  Food Insecurity: No Food Insecurity (02/16/2024)   Hunger Vital Sign    Worried About Running Out of Food in the Last Year: Never true    Ran Out of Food in the Last Year: Never true  Transportation Needs: No Transportation Needs (02/16/2024)   PRAPARE - Administrator, Civil Service (Medical): No    Lack of Transportation (Non-Medical): No  Physical Activity: Not on file  Stress: Not on file   Social Connections: Socially Integrated (02/16/2024)   Social Connection and Isolation Panel    Frequency of Communication with Friends and Family: More than three times a week    Frequency of Social Gatherings with Friends and Family: Never    Attends Religious Services: More than  4 times per year    Active Member of Clubs or Organizations: No    Attends Banker Meetings: 1 to 4 times per year    Marital Status: Married  Catering manager Violence: Not At Risk (02/16/2024)   Humiliation, Afraid, Rape, and Kick questionnaire    Fear of Current or Ex-Partner: No    Emotionally Abused: No    Physically Abused: No    Sexually Abused: No    SUBJECTIVE  Review of Systems Constitutional: Patient denies any unintentional weight loss or change in strength lntegumentary: Patient denies any rashes or pruritus Cardiovascular: Patient denies chest pain or syncope Respiratory: Patient denies shortness of breath Gastrointestinal: As per HPI Musculoskeletal: Patient reports low back pain  Neurologic: Patient denies convulsions or seizures Allergic/Immunologic: Patient denies recent allergic reaction(s) Hematologic/Lymphatic: Patient denies bleeding tendencies Endocrine: Patient denies heat/cold intolerance  GU: As per HPI.  OBJECTIVE Vitals:   05/18/24 0851  BP: (!) 151/83  Pulse: 69   There is no height or weight on file to calculate BMI.  Physical Examination Constitutional: Appears uncomfortable  Cardiovascular: No visible lower extremity edema.  Respiratory: The patient does not have audible wheezing/stridor; respirations do not appear labored  Gastrointestinal: Abdomen non-distended Musculoskeletal: Normal ROM of UEs  Skin: No obvious rashes/open sores  Neurologic: CN 2-12 grossly intact Psychiatric: Answered questions appropriately with normal affect  Hematologic/Lymphatic/Immunologic: No obvious bruises or sites of spontaneous bleeding  Urine microscopy: 11-30  WBC/hpf, >30 RBC/hpf, many bacteria, protein 2+  PVR: 140 ml  ASSESSMENT Recurrent UTI - Plan: Urinalysis, Routine w reflex microscopic, Urine culture, nitrofurantoin , macrocrystal-monohydrate, (MACROBID ) 100 MG capsule  UTI symptoms - Plan: Urine culture, nitrofurantoin , macrocrystal-monohydrate, (MACROBID ) 100 MG capsule  Nephrolithiasis - Plan: DG Abd 1 View, HYDROcodone-acetaminophen  (NORCO/VICODIN) 5-325 MG tablet, ondansetron  (ZOFRAN -ODT) 4 MG disintegrating tablet  Flank pain - Plan: DG Abd 1 View, HYDROcodone-acetaminophen  (NORCO/VICODIN) 5-325 MG tablet, ondansetron  (ZOFRAN -ODT) 4 MG disintegrating tablet  Benign prostatic hyperplasia with urinary obstruction - Plan: BLADDER SCAN AMB NON-IMAGING  Malignant neoplasm of lateral wall of urinary bladder (HCC)  Nausea and vomiting, unspecified vomiting type - Plan: ondansetron  (ZOFRAN -ODT) 4 MG disintegrating tablet  Abnormal UA. Will check urine culture and treat empirically with Macrobid  while awaiting culture results and sensitivities.  Advised KUB today to assess for stone migration.  - For pain management, we discussed the use of opioids versus OTC analgesics. A 5 day prescription was sent for Norco for PRN use for severe pain.  - For nausea / vomiting, a prescription was sent for Zofran  / for PRN use.  Will notify patient of recommendations for next steps following review of imaging results. For now will plan for follow up as previously scheduled with Dr. Sherrilee on 07/16/2024 or sooner if needed.   He was advised to go to the ER if He develops fever >100.5 F, uncontrollable pain, or other significantly concerning symptoms.  Pt verbalized understanding and agreement. All questions were answered.  PLAN Advised the following: 1. KUB today. 2. Urine culture. 3. Macrobid  (Nitrofurantoin ) 100 mg 2x/day for 7 days. 4. Continue Rapaflo  (Silodosin ) 8 mg daily. 5. Analgesics PRN for pain. 6. Zofran  PRN for nausea/vomiting. 7.  Return for follow up to be determined based on results.  Orders Placed This Encounter  Procedures   Urine culture   DG Abd 1 View    Standing Status:   Future    Expected Date:   05/18/2024    Expiration Date:   05/18/2025  Reason for Exam (SYMPTOM  OR DIAGNOSIS REQUIRED):   kidney stone    Preferred imaging location?:   [facility]   Urinalysis, Routine w reflex microscopic   BLADDER SCAN AMB NON-IMAGING    It has been explained that the patient is to follow regularly with their PCP in addition to all other providers involved in their care and to follow instructions provided by these respective offices. Patient advised to contact urology clinic if any urologic-pertaining questions, concerns, new symptoms or problems arise in the interim period.  There are no Patient Instructions on file for this visit.  Electronically signed by:  Lauraine KYM Oz, MSN, FNP-C, CUNP 05/18/2024 9:30 AM

## 2024-05-20 LAB — URINE CULTURE

## 2024-05-21 ENCOUNTER — Other Ambulatory Visit: Payer: Self-pay | Admitting: Urology

## 2024-05-21 ENCOUNTER — Ambulatory Visit (HOSPITAL_COMMUNITY)
Admission: RE | Admit: 2024-05-21 | Discharge: 2024-05-21 | Disposition: A | Discharge status: Home / Self Care | Source: Ambulatory Visit | Attending: Urology | Admitting: Urology

## 2024-05-21 ENCOUNTER — Ambulatory Visit: Payer: Self-pay | Admitting: Urology

## 2024-05-21 ENCOUNTER — Encounter (HOSPITAL_COMMUNITY)
Admission: RE | Admit: 2024-05-21 | Discharge: 2024-05-21 | Disposition: A | Discharge status: Home / Self Care | Source: Ambulatory Visit | Attending: Cardiovascular Disease | Admitting: Cardiovascular Disease

## 2024-05-21 DIAGNOSIS — N400 Enlarged prostate without lower urinary tract symptoms: Secondary | ICD-10-CM | POA: Diagnosis not present

## 2024-05-21 DIAGNOSIS — R109 Unspecified abdominal pain: Secondary | ICD-10-CM | POA: Diagnosis not present

## 2024-05-21 DIAGNOSIS — I214 Non-ST elevation (NSTEMI) myocardial infarction: Secondary | ICD-10-CM

## 2024-05-21 DIAGNOSIS — Z955 Presence of coronary angioplasty implant and graft: Secondary | ICD-10-CM

## 2024-05-21 DIAGNOSIS — Q631 Lobulated, fused and horseshoe kidney: Secondary | ICD-10-CM | POA: Diagnosis not present

## 2024-05-21 NOTE — Progress Notes (Signed)
 Please notify patient:  - Negative urine culture, no antibiotic needed at this time.  - Since KUB was inconclusive, I recommend CT stone to further assess current symptoms for possible stone passing. Order placed.

## 2024-05-21 NOTE — Telephone Encounter (Signed)
 Patient's wife was made aware and voiced understanding. Wife voiced that patient is at the hospital  getting CT done, wife also state's patient was passing blood this morning and believe patient maybe passing stone.

## 2024-05-21 NOTE — Telephone Encounter (Signed)
-----   Message from Lauraine JAYSON Oz sent at 05/21/2024  8:22 AM EDT ----- Please notify patient:  - Negative urine culture, no antibiotic needed at this time.  - Since KUB was inconclusive, I recommend CT stone to further assess current symptoms for possible stone passing. Order placed.   ----- Message ----- From: Rebecka Memos Lab Results In Sent: 05/18/2024  10:36 AM EDT To: Sarah C Larocco, FNP

## 2024-05-21 NOTE — Telephone Encounter (Signed)
 Called patient and transferred his spouse to Central Scheduling to get scheduled for CT , she wanted to let you know that he is passing a lot of blood in urine.

## 2024-05-21 NOTE — Progress Notes (Signed)
 Daily Session Note  Patient Details  Name: Kyle Lambert MRN: [id number] Date of Birth: 10/09/1951 Referring Provider:   Flowsheet Row CARDIAC REHAB PHASE II ORIENTATION from 03/15/2024 in [facility] CARDIAC REH[facility]ILITATION  Referring Provider Barbaraann Kotyk MD    Encounter Date: 05/21/2024  Check In:  Session Check In - 05/21/24 1052       Check-In   Supervising physician immediately available to respond to emergencies See telemetry face sheet for immediately available MD    Location [facility]    Staff Present Danney Daring, BS, RRT, CPFT;Heather Con HECKLE, Exercise Physiologist    Virtual Visit No    Medication changes reported     No    Fall or balance concerns reported    No    Tobacco Cessation No Change    Warm-up and Cool-down Performed on first and last piece of equipment    Resistance Training Performed Yes    VAD Patient? No    PAD/SET Patient? No      Pain Assessment   Currently in Pain? No/denies          Capillary Blood Glucose: No results found for this or any previous visit (from the past 24 hours).    Social History   Tobacco Use  Smoking Status Never  Smokeless Tobacco Never    Goals Met:  Independence with exercise equipment Exercise tolerated well No report of concerns or symptoms today Strength training completed today  Goals Unmet:  Not Applicable  Comments: Pt able to follow exercise prescription today without complaint.  Will continue to monitor for progression.

## 2024-05-21 NOTE — Progress Notes (Signed)
 ct

## 2024-05-23 ENCOUNTER — Encounter (HOSPITAL_COMMUNITY)
Admission: RE | Admit: 2024-05-23 | Discharge: 2024-05-23 | Disposition: A | Discharge status: Home / Self Care | Source: Ambulatory Visit | Attending: Cardiovascular Disease | Admitting: Cardiovascular Disease

## 2024-05-23 VITALS — Ht 66.5 in | Wt 228.0 lb

## 2024-05-23 DIAGNOSIS — I214 Non-ST elevation (NSTEMI) myocardial infarction: Secondary | ICD-10-CM | POA: Insufficient documentation

## 2024-05-23 DIAGNOSIS — Z955 Presence of coronary angioplasty implant and graft: Secondary | ICD-10-CM | POA: Insufficient documentation

## 2024-05-23 NOTE — Patient Instructions (Signed)
 Discharge Patient Instructions  Patient Details  Name: Kyle Lambert MRN: [id number] Date of Birth: [birth year] Referring Provider:  Lari Elspeth BRAVO, MD   Number of Visits: 57  Reason for Discharge:  Patient reached a stable level of exercise. Patient independent in their exercise. Patient has met program and personal goals.  Diagnosis:  NSTEMI (non-ST elevated myocardial infarction) [facility])  Status post coronary artery stent placement  Initial Exercise Prescription:  Initial Exercise Prescription - 03/15/24 1400       Date of Initial Exercise RX and Referring Provider   Date 03/15/24    Referring Provider Barbaraann Kotyk MD      Treadmill   MPH 1.5    Grade 0    Minutes 15    METs 2.15      NuStep   Level 1    SPM 60    Minutes 15    METs 1.9      Prescription Details   Frequency (times per week) 3    Duration Progress to 30 minutes of continuous aerobic without signs/symptoms of physical distress      Intensity   THRR 40-80% of Max Heartrate 98-131    Ratings of Perceived Exertion 11-13    Perceived Dyspnea 0-4      Resistance Training   Training Prescription Yes    Weight 4    Reps 10-15          Discharge Exercise Prescription (Final Exercise Prescription Changes):  Exercise Prescription Changes - 05/14/24 1500       Response to Exercise   Blood Pressure (Admit) 116/78    Blood Pressure (Exit) 110/60    Heart Rate (Admit) 63 bpm    Heart Rate (Exercise) 100 bpm    Heart Rate (Exit) 79 bpm    Rating of Perceived Exertion (Exercise) 12    Duration Continue with 30 min of aerobic exercise without signs/symptoms of physical distress.    Intensity THRR unchanged      Progression   Progression Continue to progress workloads to maintain intensity without signs/symptoms of physical distress.      Resistance Training   Training Prescription Yes    Weight 4    Reps 10-15      Treadmill   MPH 2.2    Grade 2.5    Minutes 15    METs 3.44       NuStep   Level 4    SPM 90    Minutes 15    METs 3          Functional Capacity:  6 Minute Walk     Row Name 03/15/24 1451 05/23/24 1127       6 Minute Walk   Phase Initial Discharge    Distance 950 feet 1140 feet    Distance Feet Change -- 190 ft    Walk Time 6 minutes 6 minutes    # of Rest Breaks 0 0    MPH 1.8 2.16    METS 1.32 1.72    RPE 12 12    Perceived Dyspnea  0 0    VO2 Peak 4.36 6.04    Symptoms No No    Resting HR 66 bpm 56 bpm    Resting BP 100/64 108/72    Resting Oxygen  Saturation  97 % 96 %    Exercise Oxygen  Saturation  during 6 min walk 96 % 96 %    Max Ex. HR 79 bpm 80 bpm    Max  Ex. BP 120/64 126/70    2 Minute Post BP 112/64 118/70      Nutrition & Weight - Outcomes:  Pre Biometrics - 03/15/24 1457       Pre Biometrics   Height 5' 6.5 (1.689 m)    Weight 104.7 kg    Waist Circumference 49 inches    Hip Circumference 45 inches    Waist to Hip Ratio 1.09 %    BMI (Calculated) 36.7    Grip Strength 30 kg          Post Biometrics - 05/23/24 1128        Post  Biometrics   Height 5' 6.5 (1.689 m)    Weight 103.4 kg    Waist Circumference 48 inches    Hip Circumference 44 inches    Waist to Hip Ratio 1.09 %    BMI (Calculated) 36.25    Grip Strength 24.5 kg    Single Leg Stand 15 seconds         Goals reviewed with patient; copy given to patient.

## 2024-05-23 NOTE — Progress Notes (Addendum)
 Cardiac Individual Treatment Plan  Patient Details  Name: Kyle Lambert MRN: [id number] Date of Birth: 1951/05/01 Referring Provider:   Flowsheet Row CARDIAC REHAB PHASE II ORIENTATION from 03/15/2024 in [facility] CARDIAC REHABILITATION  Referring Provider Barbaraann Kotyk MD    Initial Encounter Date:  Flowsheet Row CARDIAC REHAB PHASE II ORIENTATION from 03/15/2024 in [facility] [facility] CARDIAC REHABILITATION  Date 03/15/24    Visit Diagnosis: NSTEMI (non-ST elevated myocardial infarction) [facility])  Status post coronary artery stent placement  Patient's Home Medications on Admission:  Current Outpatient Medications:    amLODipine  (NORVASC ) 5 MG tablet, TAKE 1 TABLET DAILY, Disp: 90 tablet, Rfl: 0   apixaban  (ELIQUIS ) 5 MG TABS tablet, Take 1 tablet (5 mg total) by mouth 2 (two) times daily., Disp: 60 tablet, Rfl: 12   aspirin  EC 81 MG tablet, Take 81 mg by mouth daily., Disp: , Rfl:    atorvastatin  (LIPITOR ) 40 MG tablet, Take 40 mg by mouth at bedtime. , Disp: , Rfl:    clopidogrel  (PLAVIX ) 75 MG tablet, Take 1 tablet by mouth once daily, Disp: 90 tablet, Rfl: 3   cyclobenzaprine  (FLEXERIL ) 10 MG tablet, Take 10 mg by mouth daily as needed for muscle spasms., Disp: , Rfl:    Ferrous Sulfate  (IRON ) 325 (65 Fe) MG TABS, Take 1 tablet (325 mg total) by mouth 2 (two) times daily., Disp: 60 tablet, Rfl: 1   HYDROcodone-acetaminophen  (NORCO/VICODIN) 5-325 MG tablet, Take 1 tablet by mouth every 6 (six) hours as needed for up to 5 days for moderate pain (pain score 4-6) or severe pain (pain score 7-10)., Disp: 15 tablet, Rfl: 0   isosorbide  mononitrate (IMDUR ) 120 MG 24 hr tablet, Take 1 tablet (120 mg total) by mouth daily., Disp: 90 tablet, Rfl: 3   meclizine (ANTIVERT) 25 MG tablet, Take 25 mg by mouth 3 (three) times daily as needed for dizziness., Disp: , Rfl:    metFORMIN (GLUCOPHAGE) 1000 MG tablet, Take 1,000 mg by mouth 2 (two) times daily., Disp: , Rfl:    metoprolol  tartrate (LOPRESSOR ) 25  MG tablet, TAKE 1 TABLET TWICE A DAY (Patient taking differently: Take 25 mg by mouth 2 (two) times daily.), Disp: 60 tablet, Rfl: 0   Multiple Vitamins-Minerals (PRESERVISION AREDS 2 PO), Take 1 tablet by mouth in the morning and at bedtime., Disp: , Rfl:    nitrofurantoin , macrocrystal-monohydrate, (MACROBID ) 100 MG capsule, Take 1 capsule (100 mg total) by mouth 2 (two) times daily for 7 days., Disp: 14 capsule, Rfl: 0   nitroGLYCERIN  (NITROSTAT ) 0.4 MG SL tablet, Place 1 tablet (0.4 mg total) under the tongue every 5 (five) minutes x 3 doses as needed for chest pain., Disp: 25 tablet, Rfl: 3   ondansetron  (ZOFRAN -ODT) 4 MG disintegrating tablet, Take 1 tablet (4 mg total) by mouth every 8 (eight) hours as needed for nausea or vomiting., Disp: 20 tablet, Rfl: 1   pantoprazole  (PROTONIX ) 40 MG tablet, TAKE 1 TABLET BY MOUTH ONCE DAILY --  NEEDS  APPOINTMENT  FOR  FURTHER  REFILLS (Patient taking differently: Take 40 mg by mouth daily.), Disp: 15 tablet, Rfl: 0   silodosin  (RAPAFLO ) 8 MG CAPS capsule, Take 8 mg by mouth daily with breakfast., Disp: , Rfl:   Past Medical History: Past Medical History:  Diagnosis Date   Angina pectoris (HCC)    Arthritis    Bladder cancer (HCC) 03/2020   Bladder carcinoma (HCC)    BMI 35.0-35.9,adult    CAD (coronary artery disease)  a. 03/2012 Cath: LAD 80, RCA 80, nl EF;  b. PCI of RCA w 3.0x77mm Promus DES, FFR of LAD was nl @ 0.85 ->Med Rx.;  c. LHC 02/15/13: proximal LAD 40-50%, mid LAD 95%, small D1 95-99%, mid CFX 30-40%, proximal RCA stent patent with ostial 20-30% prior to the stent, ostial PDA 50%, EF 55-65%. PCI:  Promus DES to the mid LAD and POBA to the proximal D1    Chest pain    COVID    DM (diabetes mellitus) (HCC)    Elevated TSH    Headache    History of angina    Hypercholesterolemia    Hyperlipidemia    Hypertension    Hypotension    Kidney stone    Knee pain    Low back pain    Morbid obesity (HCC)    OA (osteoarthritis)     Obesity    OSA (obstructive sleep apnea)    Sepsis (HCC)    Skin lesion    Tension headache    Tinnitus, bilateral    Umbilical hernia    UTI (urinary tract infection)     Tobacco Use: Social History   Tobacco Use  Smoking Status Never  Smokeless Tobacco Never    Labs: Review Flowsheet  More data exists      Latest Ref Rng & Units 09/03/2017 02/01/2020 04/03/2020 03/26/2023 02/17/2024  Labs for ITP Cardiac and Pulmonary Rehab  Cholestrol 0 - 200 mg/dL - - - - 98   LDL (calc) 0 - 99 mg/dL - - - - 50   HDL-C >59 mg/dL - - - - 36   Trlycerides <150 mg/dL - - - - 61   Hemoglobin A1c 4.8 - 5.6 % - 6.3  6.5  6.4  6.4   TCO2 22 - 32 mmol/L 21  - - - -    Capillary Blood Glucose: Lab Results  Component Value Date   GLUCAP 111 (H) 03/23/2024   GLUCAP 108 (H) 03/19/2024   GLUCAP 118 (H) 02/23/2024   GLUCAP 191 (H) 02/18/2024   GLUCAP 105 (H) 02/18/2024     Exercise Target Goals: Exercise Program Goal: Individual exercise prescription set using results from initial 6 min walk test and THRR while considering  patient's activity barriers and safety.   Exercise Prescription Goal: Starting with aerobic activity 30 plus minutes a day, 3 days per week for initial exercise prescription. Provide home exercise prescription and guidelines that participant acknowledges understanding prior to discharge.  Activity Barriers & Risk Stratification:  Activity Barriers & Cardiac Risk Stratification - 03/14/24 1119       Activity Barriers & Cardiac Risk Stratification   Activity Barriers Shortness of Breath;Arthritis;Back Problems;Balance Concerns;History of Falls;Muscular Weakness;Deconditioning;Joint Problems    Cardiac Risk Stratification Moderate          6 Minute Walk:  6 Minute Walk     Row Name 03/15/24 1451         6 Minute Walk   Phase Initial     Distance 950 feet     Walk Time 6 minutes     # of Rest Breaks 0     MPH 1.8     METS 1.32     RPE 12     Perceived  Dyspnea  0     VO2 Peak 4.36     Symptoms No     Resting HR 66 bpm     Resting BP 100/64     Resting Oxygen  Saturation  97 %     Exercise Oxygen  Saturation  during 6 min walk 96 %     Max Ex. HR 79 bpm     Max Ex. BP 120/64     2 Minute Post BP 112/64        Oxygen  Initial Assessment:   Oxygen  Re-Evaluation:   Oxygen  Discharge (Final Oxygen  Re-Evaluation):   Initial Exercise Prescription:  Initial Exercise Prescription - 03/15/24 1400       Date of Initial Exercise RX and Referring Provider   Date 03/15/24    Referring Provider Barbaraann Kotyk MD      Treadmill   MPH 1.5    Grade 0    Minutes 15    METs 2.15      NuStep   Level 1    SPM 60    Minutes 15    METs 1.9      Prescription Details   Frequency (times per week) 3    Duration Progress to 30 minutes of continuous aerobic without signs/symptoms of physical distress      Intensity   THRR 40-80% of Max Heartrate 98-131    Ratings of Perceived Exertion 11-13    Perceived Dyspnea 0-4      Resistance Training   Training Prescription Yes    Weight 4    Reps 10-15          Perform Capillary Blood Glucose checks as needed.  Exercise Prescription Changes:   Exercise Prescription Changes     Row Name 04/18/24 1300 05/14/24 1500           Response to Exercise   Blood Pressure (Admit) 124/64 116/78      Blood Pressure (Exit) 116/72 110/60      Heart Rate (Admit) 50 bpm 63 bpm      Heart Rate (Exercise) 99 bpm 100 bpm      Heart Rate (Exit) 66 bpm 79 bpm      Rating of Perceived Exertion (Exercise) 12 12      Duration Continue with 30 min of aerobic exercise without signs/symptoms of physical distress. Continue with 30 min of aerobic exercise without signs/symptoms of physical distress.      Intensity THRR unchanged THRR unchanged        Progression   Progression Continue to progress workloads to maintain intensity without signs/symptoms of physical distress. Continue to progress workloads to  maintain intensity without signs/symptoms of physical distress.        Resistance Training   Training Prescription Yes Yes      Weight 4 4      Reps 10-15 10-15        Treadmill   MPH 2 2.2      Grade 0 2.5      Minutes 15 15      METs 2.53 3.44        NuStep   Level 4 4      SPM 95 90      Minutes 15 15      METs 3 3         Exercise Comments:   Exercise Comments     Row Name 03/19/24 1112           Exercise Comments First full day of exercise!  Patient was oriented to gym and equipment including functions, settings, policies, and procedures.  Patient's individual exercise prescription and treatment plan were reviewed.  All starting workloads were established based on the results of the  6 minute walk test done at initial orientation visit.  The plan for exercise progression was also introduced and progression will be customized based on patient's performance and goals.          Exercise Goals and Review:   Exercise Goals     Row Name 03/15/24 1457             Exercise Goals   Increase Physical Activity Yes       Intervention Provide advice, education, support and counseling about physical activity/exercise needs.;Develop an individualized exercise prescription for aerobic and resistive training based on initial evaluation findings, risk stratification, comorbidities and participant's personal goals.       Expected Outcomes Short Term: Attend rehab on a regular basis to increase amount of physical activity.;Long Term: Add in home exercise to make exercise part of routine and to increase amount of physical activity.;Long Term: Exercising regularly at least 3-5 days a week.       Increase Strength and Stamina Yes       Intervention Develop an individualized exercise prescription for aerobic and resistive training based on initial evaluation findings, risk stratification, comorbidities and participant's personal goals.;Provide advice, education, support and counseling  about physical activity/exercise needs.       Expected Outcomes Short Term: Increase workloads from initial exercise prescription for resistance, speed, and METs.;Short Term: Perform resistance training exercises routinely during rehab and add in resistance training at home;Long Term: Improve cardiorespiratory fitness, muscular endurance and strength as measured by increased METs and functional capacity ( )       Able to understand and use rate of perceived exertion (RPE) scale Yes       Intervention Provide education and explanation on how to use RPE scale       Expected Outcomes Short Term: Able to use RPE daily in rehab to express subjective intensity level;Long Term:  Able to use RPE to guide intensity level when exercising independently       Able to understand and use Dyspnea scale Yes       Intervention Provide education and explanation on how to use Dyspnea scale       Expected Outcomes Short Term: Able to use Dyspnea scale daily in rehab to express subjective sense of shortness of breath during exertion;Long Term: Able to use Dyspnea scale to guide intensity level when exercising independently       Knowledge and understanding of Target Heart Rate Range (THRR) Yes       Intervention Provide education and explanation of THRR including how the numbers were predicted and where they are located for reference       Expected Outcomes Short Term: Able to state/look up THRR;Long Term: Able to use THRR to govern intensity when exercising independently;Short Term: Able to use daily as guideline for intensity in rehab       Able to check pulse independently Yes       Intervention Provide education and demonstration on how to check pulse in carotid and radial arteries.;Review the importance of being able to check your own pulse for safety during independent exercise       Expected Outcomes Short Term: Able to explain why pulse checking is important during independent exercise;Long Term: Able to check  pulse independently and accurately       Understanding of Exercise Prescription Yes       Intervention Provide education, explanation, and written materials on patient's individual exercise prescription       Expected Outcomes  Short Term: Able to explain program exercise prescription;Long Term: Able to explain home exercise prescription to exercise independently          Exercise Goals Re-Evaluation :  Exercise Goals Re-Evaluation     Row Name 03/19/24 1112 03/30/24 1127 04/23/24 1126         Exercise Goal Re-Evaluation   Exercise Goals Review Knowledge and understanding of Target Heart Rate Range (THRR);Able to understand and use rate of perceived exertion (RPE) scale;Understanding of Exercise Prescription Increase Physical Activity;Increase Strength and Stamina;Understanding of Exercise Prescription Increase Physical Activity;Increase Strength and Stamina;Understanding of Exercise Prescription;Knowledge and understanding of Target Heart Rate Range (THRR)     Comments Reviewed RPE and dyspnea scale, THR and program prescription with pt today.  Pt voiced understanding and was given a copy of goals to take home. Lehman is doing well in rehab. He has noticed since starting the program that his energy has increased some and also able to do a little more around the house and yard then he use to. He is walking laps around his yard almost everyday and is enjoying getting to do things outside. Javel is doing well in rehab. He is walking some at home but trying to increase the amount he works at home. It has been a little limited lately since he has had a kidney stone issue.     Expected Outcomes Short: Use RPE daily to regulate intensity.  Long: Follow program prescription in THR. Short: continue to attend rehab regulary and increase work loads   Long: continue to exercise for increased staminia Short: continue to attend rehab regulary and increase work loads. Long: continue to exercise for increased  stamina.         Discharge Exercise Prescription (Final Exercise Prescription Changes):  Exercise Prescription Changes - 05/14/24 1500       Response to Exercise   Blood Pressure (Admit) 116/78    Blood Pressure (Exit) 110/60    Heart Rate (Admit) 63 bpm    Heart Rate (Exercise) 100 bpm    Heart Rate (Exit) 79 bpm    Rating of Perceived Exertion (Exercise) 12    Duration Continue with 30 min of aerobic exercise without signs/symptoms of physical distress.    Intensity THRR unchanged      Progression   Progression Continue to progress workloads to maintain intensity without signs/symptoms of physical distress.      Resistance Training   Training Prescription Yes    Weight 4    Reps 10-15      Treadmill   MPH 2.2    Grade 2.5    Minutes 15    METs 3.44      NuStep   Level 4    SPM 90    Minutes 15    METs 3          Nutrition:  Target Goals: Understanding of nutrition guidelines, daily intake of sodium 1500mg , cholesterol 200mg , calories 30% from fat and 7% or less from saturated fats, daily to have 5 or more servings of fruits and vegetables.  Biometrics:  Pre Biometrics - 03/15/24 1457       Pre Biometrics   Height 5' 6.5 (1.689 m)    Weight 104.7 kg    Waist Circumference 49 inches    Hip Circumference 45 inches    Waist to Hip Ratio 1.09 %    BMI (Calculated) 36.7    Grip Strength 30 kg  Nutrition Therapy Plan and Nutrition Goals:  Nutrition Therapy & Goals - 03/14/24 1133       Intervention Plan   Intervention Prescribe, educate and counsel regarding individualized specific dietary modifications aiming towards targeted core components such as weight, hypertension, lipid management, diabetes, heart failure and other comorbidities.;Nutrition handout(s) given to patient.    Expected Outcomes Short Term Goal: Understand basic principles of dietary content, such as calories, fat, sodium, cholesterol and nutrients.;Long Term Goal:  Adherence to prescribed nutrition plan.          Nutrition Assessments:  MEDIFICTS Score Key: >=70 Need to make dietary changes  40-70 Heart Healthy Diet <= 40 Therapeutic Level Cholesterol Diet  Flowsheet Row CARDIAC REHAB PHASE II ORIENTATION from 03/15/2024 in [facility] CARDIAC REHABILITATION  Picture Your Plate Total Score on Admission 58   Picture Your Plate Scores: <59 Unhealthy dietary pattern with much room for improvement. 41-50 Dietary pattern unlikely to meet recommendations for good health and room for improvement. 51-60 More healthful dietary pattern, with some room for improvement.  >60 Healthy dietary pattern, although there may be some specific behaviors that could be improved.    Nutrition Goals Re-Evaluation:  Nutrition Goals Re-Evaluation     Row Name 03/30/24 1137 04/23/24 1123           Goals   Nutrition Goal Healthy eating Healthy eating      Comment Dryden is doing well in rehab. He stated that he is trying to eat healthier. Him and his wife are working on cooking better meals. He said that he eats a lot of salads and enjoys them. They have been doing baked chicken and veggies for dinner and he liked this meal. He drinks mosty water  and lemonade. He said he did not like paying for sodas at resturants so he started to drink water  and then add lemon to it. He does not eat a lot of sweets just every once and a while. Barret is doing well in rehab. He continues to make healthy choices with his wife as they are cooking more at home.      Expected Outcome Short Term: continue to find meals that are healthy that he enjoys      long term: Continue to eat helathy choices Short: Continue to choose healthy eating habits. Long: Add more variety in his diet.         Nutrition Goals Discharge (Final Nutrition Goals Re-Evaluation):  Nutrition Goals Re-Evaluation - 04/23/24 1123       Goals   Nutrition Goal Healthy eating    Comment Ronie is doing well in rehab. He  continues to make healthy choices with his wife as they are cooking more at home.    Expected Outcome Short: Continue to choose healthy eating habits. Long: Add more variety in his diet.          Psychosocial: Target Goals: Acknowledge presence or absence of significant depression and/or stress, maximize coping skills, provide positive support system. Participant is able to verbalize types and ability to use techniques and skills needed for reducing stress and depression.  Initial Review & Psychosocial Screening:  Initial Psych Review & Screening - 03/14/24 1113       Initial Review   Current issues with Current Stress Concerns    Source of Stress Concerns Chronic Illness;Family    Comments Pt's wife has had multiple surgeries on her toes/feet the last few years, and the pt's daughter has terminal cancer.  Family Dynamics   Good Support System? Yes    Comments Pt's wife and children are his main support system.      Barriers   Psychosocial barriers to participate in program There are no identifiable barriers or psychosocial needs.;The patient should benefit from training in stress management and relaxation.      Screening Interventions   Interventions Encouraged to exercise;To provide support and resources with identified psychosocial needs;Provide feedback about the scores to participant    Expected Outcomes Long Term Goal: Stressors or current issues are controlled or eliminated.;Short Term goal: Utilizing psychosocial counselor, staff and physician to assist with identification of specific Stressors or current issues interfering with healing process. Setting desired goal for each stressor or current issue identified.;Short Term goal: Identification and review with participant of any Quality of Life or Depression concerns found by scoring the questionnaire.;Long Term goal: The participant improves quality of Life and PHQ9 Scores as seen by post scores and/or verbalization of changes           Quality of Life Scores:  Quality of Life - 03/15/24 1532       Quality of Life   Select Quality of Life      Quality of Life Scores   Health/Function Pre 14.67 %    Socioeconomic Pre 23.79 %    Psych/Spiritual Pre 24 %    Family Pre 17.3 %    GLOBAL Pre 18.85 %         Scores of 19 and below usually indicate a poorer quality of life in these areas.  A difference of  2-3 points is a clinically meaningful difference.  A difference of 2-3 points in the total score of the Quality of Life Index has been associated with significant improvement in overall quality of life, self-image, physical symptoms, and general health in studies assessing change in quality of life.  PHQ-9: Review Flowsheet       03/15/2024 03/15/2013 06/01/2012  Depression screen PHQ 2/9  Decreased Interest 3 0 0  Down, Depressed, Hopeless 3 0 1  PHQ - 2 Score 6 0 1   Altered sleeping 1 - -  Tired, decreased energy 3 - -  Change in appetite 1 - -  Feeling bad or failure about yourself  1 - -  Trouble concentrating 0 - -  Moving slowly or fidgety/restless 2 - -  Suicidal thoughts 0 - -  PHQ-9 Score 14 - -  Difficult doing work/chores Very difficult - -    Details       Data saved with a previous flowsheet row definition        Interpretation of Total Score  Total Score Depression Severity:  1-4 = Minimal depression, 5-9 = Mild depression, 10-14 = Moderate depression, 15-19 = Moderately severe depression, 20-27 = Severe depression   Psychosocial Evaluation and Intervention:  Psychosocial Evaluation - 03/15/24 1500       Psychosocial Evaluation & Interventions   Interventions Stress management education;Relaxation education;Encouraged to exercise with the program and follow exercise prescription    Comments Patient scored 13 on his initial PHQ-9. He attributes his score to his daughter being diagnosed with terminal cancer. He continues to deny any depression or anxiety. We will continue to  monitor.    Expected Outcomes Pt will meet both his personal and program goals, and he will continue to have no identifiable psychosocial barriers.    Continue Psychosocial Services  Follow up required by staff  Psychosocial Re-Evaluation:  Psychosocial Re-Evaluation     Row Name 03/30/24 1133 04/23/24 1122           Psychosocial Re-Evaluation   Current issues with None Identified Current Sleep Concerns      Comments Fernandez is doing well in rehab. He stated that his stress levels have been better since exercising and knowing he can get back to doing things he enjoyed. He does not have a lot of stress anymore and just lets it roll off his shoulders. He does have concerns about medical issues with his kidneys but is not letting this get to him Taysean is doing well in rehab. He identifies no stressors. He is having trouble sleeping right now due to a kidney stone issue. He is going to go to the urologist after here to see if they can fit him in.      Expected Outcomes Short: continue to monitor stress levels   long term: continue to have an outlet for stress and exercise for improved happyniess Short: Continue to monitor stress levels and keep them low. Long: Get the kidney stone issue worked out.      Interventions Encouraged to attend Cardiac Rehabilitation for the exercise Encouraged to attend Cardiac Rehabilitation for the exercise      Continue Psychosocial Services  Follow up required by staff Follow up required by staff         Psychosocial Discharge (Final Psychosocial Re-Evaluation):  Psychosocial Re-Evaluation - 04/23/24 1122       Psychosocial Re-Evaluation   Current issues with Current Sleep Concerns    Comments Ethyn is doing well in rehab. He identifies no stressors. He is having trouble sleeping right now due to a kidney stone issue. He is going to go to the urologist after here to see if they can fit him in.    Expected Outcomes Short: Continue to monitor stress  levels and keep them low. Long: Get the kidney stone issue worked out.    Interventions Encouraged to attend Cardiac Rehabilitation for the exercise    Continue Psychosocial Services  Follow up required by staff          Vocational Rehabilitation: Provide vocational rehab assistance to qualifying candidates.   Vocational Rehab Evaluation & Intervention:  Vocational Rehab - 03/14/24 1118       Initial Vocational Rehab Evaluation & Intervention   Assessment shows need for Vocational Rehabilitation No      Vocational Rehab Re-Evaulation   Comments The pt is retired and has no plans to return to work.          Education: Education Goals: Education classes will be provided on a weekly basis, covering required topics. Participant will state understanding/return demonstration of topics presented.  Learning Barriers/Preferences:  Learning Barriers/Preferences - 03/14/24 1117       Learning Barriers/Preferences   Learning Barriers Exercise Concerns    Learning Preferences Written Material          Education Topics: Hypertension, Hypertension Reduction -Define heart disease and high blood pressure. Discus how high blood pressure affects the body and ways to reduce high blood pressure. Flowsheet Row CARDIAC REHAB PHASE II EXERCISE from 05/16/2024 in [place] [facility]  Date 04/11/24  Educator jh  Instruction Review Code 1- Verbalizes Understanding    Exercise and Your Heart -Discuss why it is important to exercise, the FITT principles of exercise, normal and abnormal responses to exercise, and how to exercise safely. Flowsheet Row CARDIAC REHAB PHASE II EXERCISE  from 05/16/2024 in [place] [facility]  Date 05/09/24  Educator HB  Instruction Review Code 1- Verbalizes Understanding    Angina -Discuss definition of angina, causes of angina, treatment of angina, and how to decrease risk of having angina. Flowsheet Row CARDIAC REHAB PHASE II  EXERCISE from 05/16/2024 in [place] [facility]  Date 05/02/24  Educator [facility]  Instruction Review Code 1- Verbalizes Understanding    Cardiac Medications -Review what the following cardiac medications are used for, how they affect the body, and side effects that may occur when taking the medications.  Medications include Aspirin , Beta blockers, calcium  channel blockers, ACE Inhibitors, angiotensin receptor blockers, diuretics, digoxin, and antihyperlipidemics. Flowsheet Row CARDIAC REHAB PHASE II EXERCISE from 05/16/2024 in [place]  Date 04/18/24  Educator jh  Instruction Review Code 1- Verbalizes Understanding    Congestive Heart Failure -Discuss the definition of CHF, how to live with CHF, the signs and symptoms of CHF, and how keep track of weight and sodium intake.   Heart Disease and Intimacy -Discus the effect sexual activity has on the heart, how changes occur during intimacy as we age, and safety during sexual activity.   Smoking Cessation / COPD -Discuss different methods to quit smoking, the health benefits of quitting smoking, and the definition of COPD.   Nutrition I: Fats -Discuss the types of cholesterol, what cholesterol does to the heart, and how cholesterol levels can be controlled. Flowsheet Row CARDIAC REHAB PHASE II EXERCISE from 05/16/2024 in [place]  Date 03/28/24  Educator [facility]  Instruction Review Code 1- Verbalizes Understanding    Nutrition II: Labels -Discuss the different components of food labels and how to read food label   Heart Parts/Heart Disease and PAD -Discuss the anatomy of the heart, the pathway of blood circulation through the heart, and these are affected by heart disease.   Stress I: Signs and Symptoms -Discuss the causes of stress, how stress may lead to anxiety and depression, and ways to limit stress. Flowsheet Row CARDIAC REHAB PHASE II EXERCISE from 05/16/2024 in [place]  [facility]  Date 04/04/24  Educator HB  Instruction Review Code 1- Verbalizes Understanding    Stress II: Relaxation -Discuss different types of relaxation techniques to limit stress.   Warning Signs of Stroke / TIA -Discuss definition of a stroke, what the signs and symptoms are of a stroke, and how to identify when someone is having stroke.   Knowledge Questionnaire Score:  Knowledge Questionnaire Score - 03/15/24 1457       Knowledge Questionnaire Score   Pre Score 22/24          Core Components/Risk Factors/Patient Goals at Admission:  Personal Goals and Risk Factors at Admission - 03/14/24 1121       Core Components/Risk Factors/Patient Goals on Admission    Weight Management Yes    Intervention Weight Management: Provide education and appropriate resources to help participant work on and attain dietary goals.;Weight Management: Develop a combined nutrition and exercise program designed to reach desired caloric intake, while maintaining appropriate intake of nutrient and fiber, sodium and fats, and appropriate energy expenditure required for the weight goal.    Expected Outcomes Long Term: Adherence to nutrition and physical activity/exercise program aimed toward attainment of established weight goal;Short Term: Continue to assess and modify interventions until short term weight is achieved;Weight Maintenance: Understanding of the daily nutrition guidelines, which includes 25-35% calories from fat, 7% or less cal from saturated fats,  less than 200mg  cholesterol, less than 1.5gm of sodium, & 5 or more servings of fruits and vegetables daily    Improve shortness of breath with ADL's Yes    Intervention Provide education, individualized exercise plan and daily activity instruction to help decrease symptoms of SOB with activities of daily living.    Expected Outcomes Short Term: Improve cardiorespiratory fitness to achieve a reduction of symptoms when performing  ADLs;Long Term: Be able to perform more ADLs without symptoms or delay the onset of symptoms    Diabetes Yes    Intervention Provide education about signs/symptoms and action to take for hypo/hyperglycemia.;Provide education about proper nutrition, including hydration, and aerobic/resistive exercise prescription along with prescribed medications to achieve blood glucose in normal ranges: Fasting glucose 65-99 mg/dL    Expected Outcomes Short Term: Participant verbalizes understanding of the signs/symptoms and immediate care of hyper/hypoglycemia, proper foot care and importance of medication, aerobic/resistive exercise and nutrition plan for blood glucose control.;Long Term: Attainment of HbA1C < 7%.    Hypertension Yes    Intervention Provide education on lifestyle modifcations including regular physical activity/exercise, weight management, moderate sodium restriction and increased consumption of fresh fruit, vegetables, and low fat dairy, alcohol moderation, and smoking cessation.;Monitor prescription use compliance.    Expected Outcomes Short Term: Continued assessment and intervention until BP is < 140/92mm HG in hypertensive participants. < 130/64mm HG in hypertensive participants with diabetes, heart failure or chronic kidney disease.;Long Term: Maintenance of blood pressure at goal levels.    Lipids Yes    Intervention Provide education and support for participant on nutrition & aerobic/resistive exercise along with prescribed medications to achieve LDL 70mg , HDL >40mg .    Expected Outcomes Short Term: Participant states understanding of desired cholesterol values and is compliant with medications prescribed. Participant is following exercise prescription and nutrition guidelines.;Long Term: Cholesterol controlled with medications as prescribed, with individualized exercise RX and with personalized nutrition plan. Value goals: LDL < 70mg , HDL > 40 mg.    Stress Yes    Intervention Refer  participants experiencing significant psychosocial distress to appropriate mental health specialists for further evaluation and treatment. When possible, include family members and significant others in education/counseling sessions.;Offer individual and/or small group education and counseling on adjustment to heart disease, stress management and health-related lifestyle change. Teach and support self-help strategies.    Expected Outcomes Short Term: Participant demonstrates changes in health-related behavior, relaxation and other stress management skills, ability to obtain effective social support, and compliance with psychotropic medications if prescribed.;Long Term: Emotional wellbeing is indicated by absence of clinically significant psychosocial distress or social isolation.    Personal Goal Other Yes    Intervention Pt will attend CR 3 days a week.    Expected Outcomes Pt will meet both his personal and program goals.          Core Components/Risk Factors/Patient Goals Review:   Goals and Risk Factor Review     Row Name 03/30/24 1142 04/23/24 1125           Core Components/Risk Factors/Patient Goals Review   Personal Goals Review Weight Management/Obesity Weight Management/Obesity      Review Marciano is doing well in rehab. He is enjoying coming to rehab and exercising. He does not check his blood sugar at home, we checked it for his first couple visits due to his medication that he is one that lowers blood surgar. His values were WNL for exercise. He does check his blood pressure at home and trys  to do it daily. He is trying to eat healthy meals to help with weight managment. Jailen continues to check his BP at home. He says it runs with the numbers we have here. He isn't checking his blood sugars unless he feels off.      Expected Outcomes Short: comtinue to eat healthy for BS and weight manaement    long term: continue to check blood pressure daily Short: Continue to attend rehab. Long:  Continue to monitor blood pressures and blood sugars at home.         Core Components/Risk Factors/Patient Goals at Discharge (Final Review):   Goals and Risk Factor Review - 04/23/24 1125       Core Components/Risk Factors/Patient Goals Review   Personal Goals Review Weight Management/Obesity    Review Shulem continues to check his BP at home. He says it runs with the numbers we have here. He isn't checking his blood sugars unless he feels off.    Expected Outcomes Short: Continue to attend rehab. Long: Continue to monitor blood pressures and blood sugars at home.          ITP Comments:  ITP Comments     Row Name 03/15/24 1523 03/19/24 1112 04/11/24 1151 05/09/24 0848     ITP Comments Patient arrived for 1st visit/orientation/education at 1330. Patient was referred to CR by Dr. Darryle KIDDTHEORA Mulch due to NSTEMI/Status post stent placement. . During orientation advised patient on arrival and appointment times what to wear, what to do before, during and after exercise. Reviewed attendance and class policy.  Pt is scheduled to return Cardiac Rehab on 03/19/24 at 1100. Pt was advised to come to class 15 minutes before class starts.  Discussed RPE/Dpysnea scales. Patient participated in warm up stretches. Patient was able to complete 6 minute walk test.  Telemetry:NSR. Patient was measured for the equipment. Discussed equipment safety with patient. Took patient pre-anthropometric measurements. Patient finished visit at 1430. First full day of exercise!  Patient was oriented to gym and equipment including functions, settings, policies, and procedures.  Patient's individual exercise prescription and treatment plan were reviewed.  All starting workloads were established based on the results of the 6 minute walk test done at initial orientation visit.  The plan for exercise progression was also introduced and progression will be customized based on patient's performance and goals. 30 day review completed. ITP  sent to Dr. Dorn Ross, Medical Director of Cardiac Rehab. Continue with ITP unless changes are made by physician. 30 day review completed. ITP sent to Dr. Dorn Ross, Medical Director of Cardiac Rehab. Continue with ITP unless changes are made by physician.       Comments: Discharge ITP

## 2024-05-23 NOTE — Progress Notes (Signed)
 Discharge Progress Report  Patient Details  Name: Kyle Lambert MRN: [id number] Date of Birth: [birth year] Referring Provider:   Flowsheet Row CARDIAC REHAB PHASE II ORIENTATION from 03/15/2024 in [facility]ABILIT[facility]  Referring Provider Barbaraann Kotyk MD     Number of Visits: 36  Reason for Discharge:  Patient reached a stable level of exercise. Patient independent in their exercise. Patient has met program and personal goals.  Smoking History:  Social History   Tobacco Use  Smoking Status Never  Smokeless Tobacco Never    Diagnosis:  NSTEMI (non-ST elevated myocardial infarction) (HCC)  Status post coronary artery stent placement  ADL UCSD:   Initial Exercise Prescription:  Initial Exercise Prescription - 03/15/24 1400       Date of Initial Exercise RX and Referring Provider   Date 03/15/24    Referring Provider Barbaraann Kotyk MD      Treadmill   MPH 1.5    Grade 0    Minutes 15    METs 2.15      NuStep   Level 1    SPM 60    Minutes 15    METs 1.9      Prescription Details   Frequency (times per week) 3    Duration Progress to 30 minutes of continuous aerobic without signs/symptoms of physical distress      Intensity   THRR 40-80% of Max Heartrate 98-131    Ratings of Perceived Exertion 11-13    Perceived Dyspnea 0-4      Resistance Training   Training Prescription Yes    Weight 4    Reps 10-15          Discharge Exercise Prescription (Final Exercise Prescription Changes):  Exercise Prescription Changes - 05/14/24 1500       Response to Exercise   Blood Pressure (Admit) 116/78    Blood Pressure (Exit) 110/60    Heart Rate (Admit) 63 bpm    Heart Rate (Exercise) 100 bpm    Heart Rate (Exit) 79 bpm    Rating of Perceived Exertion (Exercise) 12    Duration Continue with 30 min of aerobic exercise without signs/symptoms of physical distress.    Intensity THRR unchanged      Progression   Progression Continue to progress  workloads to maintain intensity without signs/symptoms of physical distress.      Resistance Training   Training Prescription Yes    Weight 4    Reps 10-15      Treadmill   MPH 2.2    Grade 2.5    Minutes 15    METs 3.44      NuStep   Level 4    SPM 90    Minutes 15    METs 3          Functional Capacity:  6 Minute Walk     Row Name 03/15/24 1451         6 Minute Walk   Phase Initial     Distance 950 feet     Walk Time 6 minutes     # of Rest Breaks 0     MPH 1.8     METS 1.32     RPE 12     Perceived Dyspnea  0     VO2 Peak 4.36     Symptoms No     Resting HR 66 bpm     Resting BP 100/64     Resting Oxygen  Saturation  97 %  Exercise Oxygen  Saturation  during 6 min walk 96 %     Max Ex. HR 79 bpm     Max Ex. BP 120/64     2 Minute Post BP 112/64        Psychological, QOL, Others - Outcomes: PHQ 2/9:    03/15/2024    2:55 PM 03/15/2013   10:21 AM 06/01/2012   12:18 PM  Depression screen PHQ 2/9  Decreased Interest 3 0 0  Down, Depressed, Hopeless 3 0 1  PHQ - 2 Score 6 0 1   Altered sleeping 1    Tired, decreased energy 3    Change in appetite 1    Feeling bad or failure about yourself  1    Trouble concentrating 0    Moving slowly or fidgety/restless 2    Suicidal thoughts 0    PHQ-9 Score 14    Difficult doing work/chores Very difficult       Data saved with a previous flowsheet row definition   Goals reviewed with patient; copy given to patient.

## 2024-05-23 NOTE — Progress Notes (Signed)
 Daily Session Note  Patient Details  Name: Kyle Lambert MRN: [id number] Date of Birth: 10-16-1951 Referring Provider:   Flowsheet Row CARDIAC REHAB PHASE II ORIENTATION from 03/15/2024 in [facility]  Referring Provider Kyle Kotyk MD    Encounter Date: 05/23/2024  Check In:  Session Check In - 05/23/24 1032       Check-In   Supervising physician immediately available to respond to emergencies See telemetry face sheet for immediately available MD    Location [facility]    Staff Present Kyle Lambert, BS, Exercise Physiologist;Kyle Vicci, RN, BSN;Kyle Hawkins, MA, RCEP, CCRP, CCET    Virtual Visit No    Medication changes reported     No    Fall or balance concerns reported    No    Tobacco Cessation No Change    Warm-up and Cool-down Performed on first and last piece of equipment    Resistance Training Performed Yes    VAD Patient? No    PAD/SET Patient? No      Pain Assessment   Currently in Pain? No/denies    Pain Score 0-No pain    Multiple Pain Sites No          Capillary Blood Glucose: No results found for this or any previous visit (from the past 24 hours).    Social History   Tobacco Use  Smoking Status Never  Smokeless Tobacco Never    Goals Met:  Independence with exercise equipment Exercise tolerated well No report of concerns or symptoms today Strength training completed today  Goals Unmet:  Not Applicable  Comments:  Kyle Lambert graduated today from  rehab with 36 sessions completed.  Details of the patient's exercise prescription and what He needs to do in order to continue the prescription and progress were discussed with patient.  Patient was given a copy of prescription and goals.  Patient verbalized understanding. Delta plans to continue to exercise by working out by walking and doing home exercise.

## 2024-05-25 ENCOUNTER — Encounter (HOSPITAL_COMMUNITY)

## 2024-05-28 ENCOUNTER — Encounter (HOSPITAL_COMMUNITY)

## 2024-05-29 DIAGNOSIS — E1169 Type 2 diabetes mellitus with other specified complication: Secondary | ICD-10-CM | POA: Diagnosis not present

## 2024-05-29 DIAGNOSIS — R739 Hyperglycemia, unspecified: Secondary | ICD-10-CM | POA: Diagnosis not present

## 2024-05-29 DIAGNOSIS — E7849 Other hyperlipidemia: Secondary | ICD-10-CM | POA: Diagnosis not present

## 2024-05-29 DIAGNOSIS — R5383 Other fatigue: Secondary | ICD-10-CM | POA: Diagnosis not present

## 2024-05-29 DIAGNOSIS — Z1329 Encounter for screening for other suspected endocrine disorder: Secondary | ICD-10-CM | POA: Diagnosis not present

## 2024-05-30 ENCOUNTER — Encounter (HOSPITAL_COMMUNITY)

## 2024-06-01 ENCOUNTER — Encounter (HOSPITAL_COMMUNITY)

## 2024-06-04 ENCOUNTER — Encounter (HOSPITAL_COMMUNITY)

## 2024-06-05 DIAGNOSIS — E871 Hypo-osmolality and hyponatremia: Secondary | ICD-10-CM | POA: Diagnosis not present

## 2024-06-05 DIAGNOSIS — I2583 Coronary atherosclerosis due to lipid rich plaque: Secondary | ICD-10-CM | POA: Diagnosis not present

## 2024-06-05 DIAGNOSIS — Z6833 Body mass index (BMI) 33.0-33.9, adult: Secondary | ICD-10-CM | POA: Diagnosis not present

## 2024-06-05 DIAGNOSIS — E782 Mixed hyperlipidemia: Secondary | ICD-10-CM | POA: Diagnosis not present

## 2024-06-06 ENCOUNTER — Encounter (HOSPITAL_COMMUNITY)

## 2024-06-06 DIAGNOSIS — I700 Atherosclerosis of aorta: Secondary | ICD-10-CM | POA: Diagnosis not present

## 2024-06-06 DIAGNOSIS — I10 Essential (primary) hypertension: Secondary | ICD-10-CM | POA: Diagnosis not present

## 2024-06-06 DIAGNOSIS — E785 Hyperlipidemia, unspecified: Secondary | ICD-10-CM | POA: Diagnosis not present

## 2024-06-06 DIAGNOSIS — E1169 Type 2 diabetes mellitus with other specified complication: Secondary | ICD-10-CM | POA: Diagnosis not present

## 2024-06-06 DIAGNOSIS — M1990 Unspecified osteoarthritis, unspecified site: Secondary | ICD-10-CM | POA: Diagnosis not present

## 2024-06-06 DIAGNOSIS — K219 Gastro-esophageal reflux disease without esophagitis: Secondary | ICD-10-CM | POA: Diagnosis not present

## 2024-06-06 DIAGNOSIS — H3530 Unspecified macular degeneration: Secondary | ICD-10-CM | POA: Diagnosis not present

## 2024-06-06 DIAGNOSIS — I2583 Coronary atherosclerosis due to lipid rich plaque: Secondary | ICD-10-CM | POA: Diagnosis not present

## 2024-06-06 DIAGNOSIS — E669 Obesity, unspecified: Secondary | ICD-10-CM | POA: Diagnosis not present

## 2024-06-06 DIAGNOSIS — M62838 Other muscle spasm: Secondary | ICD-10-CM | POA: Diagnosis not present

## 2024-06-06 DIAGNOSIS — I252 Old myocardial infarction: Secondary | ICD-10-CM | POA: Diagnosis not present

## 2024-06-06 DIAGNOSIS — G8929 Other chronic pain: Secondary | ICD-10-CM | POA: Diagnosis not present

## 2024-06-07 DIAGNOSIS — L570 Actinic keratosis: Secondary | ICD-10-CM | POA: Diagnosis not present

## 2024-06-07 DIAGNOSIS — C44319 Basal cell carcinoma of skin of other parts of face: Secondary | ICD-10-CM | POA: Diagnosis not present

## 2024-06-07 DIAGNOSIS — C44311 Basal cell carcinoma of skin of nose: Secondary | ICD-10-CM | POA: Diagnosis not present

## 2024-06-07 DIAGNOSIS — X32XXXD Exposure to sunlight, subsequent encounter: Secondary | ICD-10-CM | POA: Diagnosis not present

## 2024-06-07 DIAGNOSIS — L820 Inflamed seborrheic keratosis: Secondary | ICD-10-CM | POA: Diagnosis not present

## 2024-06-22 ENCOUNTER — Ambulatory Visit: Admitting: Podiatry

## 2024-06-22 ENCOUNTER — Telehealth: Payer: Self-pay | Admitting: Urology

## 2024-06-22 ENCOUNTER — Other Ambulatory Visit: Payer: Self-pay | Admitting: Urology

## 2024-06-22 DIAGNOSIS — M79674 Pain in right toe(s): Secondary | ICD-10-CM

## 2024-06-22 DIAGNOSIS — M79675 Pain in left toe(s): Secondary | ICD-10-CM | POA: Diagnosis not present

## 2024-06-22 DIAGNOSIS — B351 Tinea unguium: Secondary | ICD-10-CM | POA: Diagnosis not present

## 2024-06-22 MED ORDER — OXYCODONE-ACETAMINOPHEN 5-325 MG PO TABS: 1.0000 | ORAL_TABLET | ORAL | 0 refills | Status: AC | PRN

## 2024-06-22 NOTE — Progress Notes (Signed)
  Subjective:  Patient ID: Kyle Lambert, male    DOB: May 10, 1951,  MRN: [id number]  Chief Complaint  Patient presents with   Nail Problem    Pt stated that he is a diabetic and he thinks he may have some nail fungus and he would like to have his nails trimmed    73 y.o. male returns for the above complaint.  Patient presents with thickened and longer dystrophic mycotic toenails x 10 mild pain on palpation arch with ambulation and shoe pressure has not seen MRIs prior to seeing me denies any other acute complaints  Objective:  There were no vitals filed for this visit. Podiatric Exam: Vascular: dorsalis pedis and posterior tibial pulses are palpable bilateral. Capillary return is immediate. Temperature gradient is WNL. Skin turgor WNL  Sensorium: Normal Semmes Weinstein monofilament test. Normal tactile sensation bilaterally. Nail Exam: Pt has thick disfigured discolored nails with subungual debris noted bilateral entire nail hallux through fifth toenails.  Pain on palpation to the nails. Ulcer Exam: There is no evidence of ulcer or pre-ulcerative changes or infection. Orthopedic Exam: Muscle tone and strength are WNL. No limitations in general ROM. No crepitus or effusions noted.  Skin: No Porokeratosis. No infection or ulcers    Assessment & Plan:   1. Pain due to onychomycosis of toenails of both feet     Patient was evaluated and treated and all questions answered.  Onychomycosis with pain  -Nails palliatively debrided as below. -Educated on self-care  Procedure: Nail Debridement Rationale: pain  Type of Debridement: manual, sharp debridement. Instrumentation: Nail nipper, rotary burr. Number of Nails: 10  Procedures and Treatment: Consent by patient was obtained for treatment procedures. The patient understood the discussion of treatment and procedures well. All questions were answered thoroughly reviewed. Debridement of mycotic and hypertrophic toenails, 1 through 5  bilateral and clearing of subungual debris. No ulceration, no infection noted.  Return Visit-Office Procedure: Patient instructed to return to the office for a follow up visit 3 months for continued evaluation and treatment.  Franky Blanch, DPM    Return in about 3 months (around 09/22/2024) for RFC .

## 2024-06-22 NOTE — Telephone Encounter (Signed)
 Called pt to make them aware a message was sent to provider and confirmed pharmacy of [facility] in [place]

## 2024-06-22 NOTE — Telephone Encounter (Signed)
 Needs pain medication sent to [facility] . He is having back pain from his kidneys

## 2024-07-04 ENCOUNTER — Other Ambulatory Visit: Payer: PPO | Admitting: Urology

## 2024-07-09 ENCOUNTER — Other Ambulatory Visit: Payer: Self-pay

## 2024-07-09 ENCOUNTER — Other Ambulatory Visit

## 2024-07-09 ENCOUNTER — Telehealth: Payer: Self-pay | Admitting: Urology

## 2024-07-09 DIAGNOSIS — R310 Gross hematuria: Secondary | ICD-10-CM

## 2024-07-09 DIAGNOSIS — R399 Unspecified symptoms and signs involving the genitourinary system: Secondary | ICD-10-CM | POA: Diagnosis not present

## 2024-07-09 LAB — URINALYSIS, ROUTINE W REFLEX MICROSCOPIC
Bilirubin, UA: NEGATIVE
Glucose, UA: NEGATIVE
Nitrite, UA: POSITIVE — AB
Specific Gravity, UA: 1.025 (ref 1.005–1.030)
Urobilinogen, Ur: 4 mg/dL — ABNORMAL HIGH (ref 0.2–1.0)
pH, UA: 6.5 (ref 5.0–7.5)

## 2024-07-09 LAB — MICROSCOPIC EXAMINATION
RBC, Urine: >30 /HPF — AB (ref 0–2)
WBC, UA: >30 /HPF — AB (ref 0–5)

## 2024-07-09 MED ORDER — DOXYCYCLINE HYCLATE 100 MG PO CAPS: 100.0000 mg | ORAL_CAPSULE | Freq: Two times a day (BID) | ORAL | 0 refills | Status: DC

## 2024-07-09 NOTE — Telephone Encounter (Signed)
 Patient denies fever and states he feels like his bladder is emptying completely. Patient states it burns when he voids, he has urinary frequency and hematuria. Patient placed on lab scheduled for ua/cx.

## 2024-07-09 NOTE — Telephone Encounter (Signed)
 Wife called and made aware of urine being sent for culture and antibiotic sent to pharmacy.

## 2024-07-09 NOTE — Telephone Encounter (Signed)
 Dysuria  Patient called with c/o dysuria x 2-3 days days.  Pain: sharp  Severity:7/10  Has a lot of blood in urine  Associated Signs and Symptoms:  Fever: no Chills: no Hematuria: yes Urgency: yes Frequency: yes Hesitancy:yes Incontinence: no Nausea: no Vomiting: no  Message sent to clinic staff to return call to patient to advise of plan.

## 2024-07-16 ENCOUNTER — Encounter: Payer: Self-pay | Admitting: Urology

## 2024-07-16 ENCOUNTER — Ambulatory Visit (INDEPENDENT_AMBULATORY_CARE_PROVIDER_SITE_OTHER): Admitting: Urology

## 2024-07-16 ENCOUNTER — Ambulatory Visit: Admitting: Urology

## 2024-07-16 VITALS — BP 134/78 | HR 66

## 2024-07-16 DIAGNOSIS — C672 Malignant neoplasm of lateral wall of bladder: Secondary | ICD-10-CM

## 2024-07-16 DIAGNOSIS — N3081 Other cystitis with hematuria: Secondary | ICD-10-CM | POA: Diagnosis not present

## 2024-07-16 DIAGNOSIS — N390 Urinary tract infection, site not specified: Secondary | ICD-10-CM | POA: Diagnosis not present

## 2024-07-16 DIAGNOSIS — Z87440 Personal history of urinary (tract) infections: Secondary | ICD-10-CM

## 2024-07-16 LAB — URINE CULTURE

## 2024-07-16 LAB — URINALYSIS, ROUTINE W REFLEX MICROSCOPIC
Bilirubin, UA: NEGATIVE
Glucose, UA: NEGATIVE
Nitrite, UA: NEGATIVE
Protein,UA: NEGATIVE
Specific Gravity, UA: 1.01 (ref 1.005–1.030)
Urobilinogen, Ur: 2 mg/dL — ABNORMAL HIGH (ref 0.2–1.0)
pH, UA: 6 (ref 5.0–7.5)

## 2024-07-16 LAB — MICROSCOPIC EXAMINATION: RBC, Urine: >30 /HPF — AB (ref 0–2)

## 2024-07-16 MED ORDER — SULFAMETHOXAZOLE-TRIMETHOPRIM 800-160 MG PO TABS
1.0000 | ORAL_TABLET | Freq: Two times a day (BID) | ORAL | 0 refills | Status: DC
Start: 2024-07-16 — End: 2024-08-29

## 2024-07-16 MED ORDER — CIPROFLOXACIN HCL 500 MG PO TABS: 500.0000 mg | ORAL_TABLET | Freq: Once | ORAL | Status: DC

## 2024-07-16 NOTE — Progress Notes (Unsigned)
 MD Culture:  TRK: [id number]  Confirmation: [id number]

## 2024-07-16 NOTE — Patient Instructions (Signed)
 Urinary Tract Infection, Male A urinary tract infection (UTI) is an infection in your urinary tract. The urinary tract is made up of the organs that make, store, and get rid of pee (urine) in your body. These organs include: The kidneys. The ureters. The bladder. The urethra. What are the causes? Most UTIs are caused by germs called bacteria. They may be in or near your genitals. These germs grow and cause swelling in your urinary tract. What increases the risk? You're more likely to get a UTI if: You have a soft tube called a catheter that drains your pee. You can't control when you pee or poop. You have trouble peeing because of: A prostate that's bigger than normal. A kidney stone. A urinary blockage. A nerve condition that affects your bladder. Not getting enough to drink. You're sexually active. You're an older adult. You're also more likely to get a UTI if you have other health problems, such as: Diabetes. A weak immune system. Your immune system is your body's defense system. Sickle cell disease. Injury of the spine. What are the signs or symptoms? Symptoms may include: Needing to pee right away. Peeing small amounts often. Trouble getting the stream started. Pain or burning when you pee. Blood in your pee. Pee that smells bad or odd. Pain in your belly or lower back. You may also: Feel confused. This may be the first symptom in older adults. Vomit. Not feel hungry. Feel tired or easily annoyed. Have a fever or chills. How is this diagnosed? A UTI is diagnosed based on your medical history and an exam. You may also have other tests. These may include: Pee tests. Blood tests. Tests for sexually transmitted infections (STIs). If you've had more than one UTI, you may need to have imaging studies done to find out why you keep getting them. How is this treated? A UTI can be treated by: Taking antibiotics or other medicines. Drinking enough fluid to keep your pee  pale yellow. In rare cases, a UTI can cause a very bad condition called sepsis. Sepsis may be treated in the hospital. Follow these instructions at home: Medicines Take your medicines only as told by your health care provider. If you were given antibiotics, take them as told by your provider. Do not stop taking them even if you start to feel better. General instructions Make sure you: Pee often and fully. Do not hold your pee for a long time. Pee after you have sex. Contact a health care provider if: Your symptoms don't get better after 1-2 days of taking antibiotics. Your symptoms go away and then come back. You have a fever or chills. You vomit or feel like you may vomit. Get help right away if: You have very bad pain in your back or lower belly. You faint. This information is not intended to replace advice given to you by your health care provider. Make sure you discuss any questions you have with your health care provider. Document Revised: 10/19/2023 Document Reviewed: 02/11/2023 Elsevier Patient Education  2025 [employer].

## 2024-07-16 NOTE — Progress Notes (Unsigned)
 07/16/2024 11:12 AM   Kyle Lambert Apr 07, 1951 [id number]  Referring provider: Lari Elspeth BRAVO, MD [address] [place],  [place] [address]  Frequent UTI   HPI: Kyle Lambert is a 73yo here for followup for recurrent UTI. He was scheduled for cystoscopy today but his UA is concerning for infection. He has pain with each urination which is improving over the past week since starting doxycycline . He was having hematuria until yesterday. He notes intermittent right flank pain. He has baseline urinary urgency and frequency. Nocturia 1-3x.    PMH: Past Medical History:  Diagnosis Date   Angina pectoris (HCC)    Arthritis    Bladder cancer (HCC) 03/2020   Bladder carcinoma (HCC)    BMI 35.0-35.9,adult    CAD (coronary artery disease)    a. 03/2012 Cath: LAD 80, RCA 80, nl EF;  b. PCI of RCA w 3.0x39mm Promus DES, FFR of LAD was nl @ 0.85 ->Med Rx.;  c. LHC 02/15/13: proximal LAD 40-50%, mid LAD 95%, small D1 95-99%, mid CFX 30-40%, proximal RCA stent patent with ostial 20-30% prior to the stent, ostial PDA 50%, EF 55-65%. PCI:  Promus DES to the mid LAD and POBA to the proximal D1    Chest pain    COVID    DM (diabetes mellitus) (HCC)    Elevated TSH    Headache    History of angina    Hypercholesterolemia    Hyperlipidemia    Hypertension    Hypotension    Kidney stone    Knee pain    Low back pain    Morbid obesity (HCC)    OA (osteoarthritis)    Obesity    OSA (obstructive sleep apnea)    Sepsis (HCC)    Skin lesion    Tension headache    Tinnitus, bilateral    Umbilical hernia    UTI (urinary tract infection)     Surgical History: Past Surgical History:  Procedure Laterality Date   CORONARY ANGIOPLASTY WITH STENT PLACEMENT  04/24/12   DES-RCA   CORONARY ANGIOPLASTY WITH STENT PLACEMENT  02/15/13   Patent RCA stent, diffuse nonobstructive R PDA, LCx disease, 99% mid LAD stenosis s/p DES-mid LAD; LVEF 60%   CORONARY LITHOTRIPSY N/A 02/17/2024   Procedure: CORONARY  LITHOTRIPSY;  Surgeon: Verlin Lonni BIRCH, MD;  [facility];  Service: Cardiovascular;  Laterality: N/A;   CORONARY STENT INTERVENTION N/A 02/17/2024   Procedure: CORONARY STENT INTERVENTION;  Surgeon: Verlin Lonni BIRCH, MD;  Location: [facility];  Service: Cardiovascular;  Laterality: N/A;   CORONARY STENT INTERVENTION N/A 02/23/2024   Procedure: CORONARY STENT INTERVENTION;  Surgeon: Verlin Lonni BIRCH, MD;  Location: [facility];  Service: Cardiovascular;  Laterality: N/A;  OM1   CORONARY STENT PLACEMENT     CYSTOSCOPY W/ RETROGRADES  04/07/2020   Procedure: CYSTOSCOPY WITH LEFT RETROGRADE PYELOGRAM;  Surgeon: Sherrilee Belvie CROME, MD;  Location: [facility];  Service: Urology;;   CYSTOSCOPY WITH BIOPSY  04/07/2020   Procedure: CYSTOSCOPY WITH BLADDER BIOPSY;  Surgeon: Sherrilee Belvie CROME, MD;  Location: [facility];  Service: Urology;;   PHYLLIS WITH FULGERATION  04/07/2020   Procedure: PHYLLIS WITH FULGERATION;  Surgeon: Sherrilee Belvie CROME, MD;  Location: [facility];  Service: Urology;;   CYSTOSCOPY/URETEROSCOPY/HOLMIUM LASER/STENT PLACEMENT Left 04/07/2020   Procedure: CYSTOSCOPY/LEFT URETEROSCOPY/HOLMIUM LASER LITHOTRIPSY/LEFT URETERAL STENT PLACEMENT;  Surgeon: Sherrilee Belvie CROME, MD;  Location: [facility];  Service: Urology;  Laterality: Left;  HERNIA REPAIR Left inguinal   LEFT HEART CATH AND CORONARY ANGIOGRAPHY N/A 01/31/2019   Procedure: LEFT HEART CATH AND CORONARY ANGIOGRAPHY;  Surgeon: Wonda Sharper, MD;  [facility] CV [facility];  Service: Cardiovascular;  Laterality: N/A;   LEFT HEART CATH AND CORONARY ANGIOGRAPHY N/A 02/17/2024   Procedure: LEFT HEART CATH AND CORONARY ANGIOGRAPHY;  Surgeon: Verlin Lonni BIRCH, MD;  [facility];  Service: Cardiovascular;  Laterality: N/A;   LEFT HEART CATH AND CORONARY ANGIOGRAPHY N/A 02/23/2024   Procedure: LEFT HEART CATH AND CORONARY ANGIOGRAPHY;  Surgeon: Verlin Lonni BIRCH, MD;   [facility];  Service: Cardiovascular;  Laterality: N/A;   LEFT HEART CATHETERIZATION WITH CORONARY ANGIOGRAM N/A 04/21/2012   Procedure: LEFT HEART CATHETERIZATION WITH CORONARY ANGIOGRAM;  Surgeon: Debby BIRCH Como, MD;  Location: [facility] CATH LAB;  Service: Cardiovascular;  Laterality: N/A;   LEFT HEART CATHETERIZATION WITH CORONARY ANGIOGRAM N/A 02/15/2013   Procedure: LEFT HEART CATHETERIZATION WITH CORONARY ANGIOGRAM;  Surgeon: Sharper Wonda, MD;  Location: [facility] CATH LAB;  Service: Cardiovascular;  Laterality: N/A;   LITHOTRIPSY     PERCUTANEOUS CORONARY STENT INTERVENTION (PCI-S) N/A 04/24/2012   Procedure: PERCUTANEOUS CORONARY STENT INTERVENTION (PCI-S);  Surgeon: Sharper Wonda, MD;  Location: [facility] CATH LAB;  Service: Cardiovascular;  Laterality: N/A;   PERCUTANEOUS CORONARY STENT INTERVENTION (PCI-S)  02/15/2013   Procedure: PERCUTANEOUS CORONARY STENT INTERVENTION (PCI-S);  Surgeon: Sharper Wonda, MD;  Location: [facility] CATH LAB;  Service: Cardiovascular;;    Home Medications:  Allergies as of 07/16/2024   No Known Allergies      Medication List        Accurate as of July 16, 2024 11:12 AM. If you have any questions, ask your nurse or doctor.          amLODipine  5 MG tablet Commonly known as: NORVASC  TAKE 1 TABLET DAILY   apixaban  5 MG Tabs tablet Commonly known as: ELIQUIS  Take 1 tablet (5 mg total) by mouth 2 (two) times daily.   aspirin  EC 81 MG tablet Take 81 mg by mouth daily.   atorvastatin  40 MG tablet Commonly known as: LIPITOR  Take 40 mg by mouth at bedtime.   clopidogrel  75 MG tablet Commonly known as: PLAVIX  Take 1 tablet by mouth once daily   cyclobenzaprine  10 MG tablet Commonly known as: FLEXERIL  Take 10 mg by mouth daily as needed for muscle spasms.   doxycycline  100 MG capsule Commonly known as: VIBRAMYCIN  Take 1 capsule (100 mg total) by mouth 2 (two) times daily.   FeroSul 325 (65 Fe) MG tablet Generic drug: ferrous sulfate  Take  1 tablet (325 mg total) by mouth 2 (two) times daily.   isosorbide  mononitrate 120 MG 24 hr tablet Commonly known as: Imdur  Take 1 tablet (120 mg total) by mouth daily.   meclizine 25 MG tablet Commonly known as: ANTIVERT Take 25 mg by mouth 3 (three) times daily as needed for dizziness.   metFORMIN 1000 MG tablet Commonly known as: GLUCOPHAGE Take 1,000 mg by mouth 2 (two) times daily.   metoprolol  tartrate 25 MG tablet Commonly known as: LOPRESSOR  TAKE 1 TABLET TWICE A DAY   nitroGLYCERIN  0.4 MG SL tablet Commonly known as: NITROSTAT  Place 1 tablet (0.4 mg total) under the tongue every 5 (five) minutes x 3 doses as needed for chest pain.   ondansetron  4 MG disintegrating tablet Commonly known as: ZOFRAN -ODT Take 1 tablet (4 mg total) by mouth every 8 (eight) hours as needed for nausea  or vomiting.   oxyCODONE -acetaminophen  5-325 MG tablet Commonly known as: Percocet Take 1 tablet by mouth every 4 (four) hours as needed.   pantoprazole  40 MG tablet Commonly known as: PROTONIX  TAKE 1 TABLET BY MOUTH ONCE DAILY --  NEEDS  APPOINTMENT  FOR  FURTHER  REFILLS What changed: See the new instructions.   PRESERVISION AREDS 2 PO Take 1 tablet by mouth in the morning and at bedtime.   silodosin  8 MG Caps capsule Commonly known as: RAPAFLO  Take 8 mg by mouth daily with breakfast.        Allergies: No Known Allergies  Family History: Family History  Problem Relation Age of Onset   Cancer Mother    Rheum arthritis Mother    Lung cancer Father    Stroke Brother     Social History:  reports that he has never smoked. He has never used smokeless tobacco. He reports that he does not drink alcohol and does not use drugs.  ROS: All other review of systems were reviewed and are negative except what is noted above in HPI  Physical Exam: BP 134/78   Pulse 66   Constitutional:  Alert and oriented, No acute distress. HEENT: [place] AT, moist mucus membranes.  Trachea midline, no  masses. Cardiovascular: No clubbing, cyanosis, or edema. Respiratory: Normal respiratory effort, no increased work of breathing. GI: Abdomen is soft, nontender, nondistended, no abdominal masses GU: No CVA tenderness.  Lymph: No cervical or inguinal lymphadenopathy. Skin: No rashes, bruises or suspicious lesions. Neurologic: Grossly intact, no focal deficits, moving all 4 extremities. Psychiatric: Normal mood and affect.  Laboratory Data: Lab Results  Component Value Date   WBC 8.1 02/18/2024   HGB 9.5 (L) 02/18/2024   HCT 29.8 (L) 02/18/2024   MCV 77.4 (L) 02/18/2024   PLT 241 02/18/2024    Lab Results  Component Value Date   CREATININE 0.80 02/18/2024    No results found for: PSA  No results found for: TESTOSTERONE  Lab Results  Component Value Date   HGBA1C 6.4 (H) 02/17/2024    Urinalysis    Component Value Date/Time   COLORURINE RED (A) 03/25/2023 2255   APPEARANCEUR Cloudy (A) 07/09/2024 1131   LABSPEC  03/25/2023 2255    TEST NOT REPORTED DUE TO COLOR INTERFERENCE OF URINE PIGMENT   PHURINE  03/25/2023 2255    TEST NOT REPORTED DUE TO COLOR INTERFERENCE OF URINE PIGMENT   GLUCOSEU Negative 07/09/2024 1131   HGBUR NEGATIVE 03/25/2023 2255   BILIRUBINUR Negative 07/09/2024 1131   KETONESUR NEGATIVE 03/25/2023 2255   PROTEINUR 3+ (A) 07/09/2024 1131   PROTEINUR NEGATIVE 03/25/2023 2255   UROBILINOGEN 0.2 04/14/2020 1004   UROBILINOGEN 2.0 (H) 04/22/2012 1905   NITRITE Positive (A) 07/09/2024 1131   NITRITE NEGATIVE 03/25/2023 2255   LEUKOCYTESUR 2+ (A) 07/09/2024 1131   LEUKOCYTESUR NEGATIVE 03/25/2023 2255    Lab Results  Component Value Date   LABMICR See below: 07/09/2024   WBCUA >30 (A) 07/09/2024   LABEPIT 0-10 07/09/2024   MUCUS Present 05/21/2022   BACTERIA Many (A) 07/09/2024    Pertinent Imaging:  Results for orders placed during the hospital encounter of 05/18/24  DG Abd 1 View  Narrative CLINICAL DATA:  Kidney stone.  Nausea  and flank pain.  EXAM: ABDOMEN - 1 VIEW  COMPARISON:  CT 05/01/2024  FINDINGS: Horseshoe kidney configuration on CT. Lower pole right renal calculus on CT is not well seen by radiograph. Calcifications in the pelvis correspond to phleboliths.  Normal bowel gas pattern with moderate colonic stool the sigmoid colon is redundant as before.  IMPRESSION: Lower pole right renal calculus on CT is not well seen by radiograph.   Electronically Signed By: Andrea Gasman M.D. On: 05/20/2024 23:39  No results found for this or any previous visit.  No results found for this or any previous visit.  No results found for this or any previous visit.  Results for orders placed during the hospital encounter of 12/19/20  Ultrasound renal complete  Narrative CLINICAL DATA:  Nephrolithiasis, history bladder cancer  EXAM: RENAL / URINARY TRACT ULTRASOUND COMPLETE  COMPARISON:  06/09/2020  Correlation: CT abdomen and pelvis 02/01/2020  FINDINGS: Right Kidney:  Renal measurements: 15.0 x 9.2 x 7.6 cm = volume: 542 mL. Suboptimally visualized due to body habitus and horseshoe morphology. No mass or hydronephrosis. Nonshadowing echogenic focus 5 mm diameter at mid kidney, nonspecific. No shadowing calculi visualized.  Left Kidney:  Renal measurements: 13.7 x 6.6 x 5.2 cm = volume: 244 mL. Suboptimally visualized due to body habitus and horseshoe morphology. Cortical thinning. Normal cortical echogenicity. No mass, hydronephrosis or shadowing calcification.  Bladder:  Appears normal for degree of bladder distention. BILATERAL ureteral jets visualized. Small posterior wall bladder diverticulum.  Other:  None.  IMPRESSION: Horseshoe kidney without gross mass or hydronephrosis.  Small bladder diverticulum.   Electronically Signed By: Oneil Kiss M.D. On: 12/19/2020 18:17  No results found for this or any previous visit.  No results found for this or any previous  visit.  Results for orders placed during the hospital encounter of 05/21/24  CT RENAL STONE STUDY  Narrative CLINICAL DATA:  Abdominal/flank pain, kidney stone follow-up, nausea.  EXAM: CT ABDOMEN AND PELVIS WITHOUT CONTRAST  TECHNIQUE: Multidetector CT imaging of the abdomen and pelvis was performed following the standard protocol without IV contrast.  RADIATION DOSE REDUCTION: This exam was performed according to the departmental dose-optimization program which includes automated exposure control, adjustment of the mA and/or kV according to patient size and/or use of iterative reconstruction technique.  COMPARISON:  05/01/2024.  FINDINGS: Lower chest: No acute findings. Atherosclerotic calcification of the aorta, aortic valve and coronary arteries. Heart is enlarged. No pericardial or pleural effusion. Distal esophagus is grossly unremarkable.  Hepatobiliary: Liver and gallbladder are unremarkable. No biliary ductal dilatation.  Pancreas: Negative.  Spleen: Negative.  Adrenals/Urinary Tract: Horseshoe kidney with punctate central and right-sided stones. Ureters are decompressed. Bladder diverticula.  Stomach/Bowel: Stomach, small bowel, appendix and colon are unremarkable.  Vascular/Lymphatic: Atherosclerotic calcification of the aorta. No pathologically enlarged lymph nodes.  Reproductive: Prostate is slightly enlarged.  Other: Small bilateral inguinal hernias contain fat. No free fluid. Mesenteries and peritoneum are unremarkable.  Musculoskeletal: Degenerative changes in the spine.  IMPRESSION: 1. No acute findings. 2. Horseshoe kidney with punctate stones. 3. Slightly enlarged prostate. 4. Aortic atherosclerosis (ICD10-I70.0). Coronary artery calcification.   Electronically Signed By: Newell Eke M.D. On: 05/21/2024 10:08   Assessment & Plan:    1. Malignant neoplasm of lateral wall of urinary bladder (HCC) (Primary) Reschedule  cystoscopy - Urinalysis, Routine w reflex microscopic - ciprofloxacin  (CIPRO ) tablet 500 mg - Cystoscopy  2. Recurrent UTI -MDX culture -bactrim  DS BID for 7 days   No follow-ups on file.  Belvie Clara, MD  [facility] [place]

## 2024-07-18 ENCOUNTER — Encounter: Payer: Self-pay | Admitting: Urology

## 2024-07-25 ENCOUNTER — Ambulatory Visit: Admitting: Urology

## 2024-07-25 ENCOUNTER — Encounter: Payer: Self-pay | Admitting: Urology

## 2024-07-25 VITALS — BP 130/75 | HR 70

## 2024-07-25 DIAGNOSIS — C672 Malignant neoplasm of lateral wall of bladder: Secondary | ICD-10-CM | POA: Diagnosis not present

## 2024-07-25 LAB — URINALYSIS, ROUTINE W REFLEX MICROSCOPIC
Bilirubin, UA: NEGATIVE
Glucose, UA: NEGATIVE
Ketones, UA: NEGATIVE
Nitrite, UA: NEGATIVE
Specific Gravity, UA: 1.03 (ref 1.005–1.030)
Urobilinogen, Ur: 2 mg/dL — ABNORMAL HIGH (ref 0.2–1.0)
pH, UA: 6 (ref 5.0–7.5)

## 2024-07-25 LAB — MICROSCOPIC EXAMINATION

## 2024-07-25 MED ORDER — CIPROFLOXACIN HCL 500 MG PO TABS
500.0000 mg | ORAL_TABLET | Freq: Once | ORAL | Status: AC
Start: 2024-07-25 — End: 2024-07-25
  Administered 2024-07-25: 500 mg via ORAL

## 2024-07-25 NOTE — Patient Instructions (Signed)

## 2024-07-25 NOTE — Progress Notes (Signed)
   07/25/24  CC: followup bladder cancer  HPI: [name] is a 73yo here for followup for bladder cancer.  Blood pressure 130/75, pulse 70. NED. A&Ox3.   No respiratory distress   Abd soft, NT, ND Normal phallus with bilateral descended testicles  Cystoscopy Procedure Note  Patient identification was confirmed, informed consent was obtained, and patient was prepped using Betadine solution.  Lidocaine  jelly was administered per urethral meatus.     Pre-Procedure: - Inspection reveals a normal caliber ureteral meatus.  Procedure: The flexible cystoscope was introduced without difficulty - No urethral strictures/lesions are present. - Enlarged prostate  - Normal bladder neck - Bilateral ureteral orifices identified - Bladder mucosa  reveals no ulcers, tumors, or lesions - No bladder stones - Mild trabeculation     Post-Procedure: - Patient tolerated the procedure well  Assessment/ Plan: Followup 6 months for cystoscopy  No follow-ups on file.  Belvie Clara, MD

## 2024-08-07 ENCOUNTER — Telehealth: Payer: Self-pay

## 2024-08-07 ENCOUNTER — Ambulatory Visit

## 2024-08-07 DIAGNOSIS — N390 Urinary tract infection, site not specified: Secondary | ICD-10-CM

## 2024-08-07 DIAGNOSIS — R339 Retention of urine, unspecified: Secondary | ICD-10-CM

## 2024-08-07 DIAGNOSIS — R350 Frequency of micturition: Secondary | ICD-10-CM | POA: Diagnosis not present

## 2024-08-07 LAB — URINALYSIS, ROUTINE W REFLEX MICROSCOPIC
Bilirubin, UA: NEGATIVE
Glucose, UA: NEGATIVE
Nitrite, UA: POSITIVE — AB
Specific Gravity, UA: 1.02 (ref 1.005–1.030)
Urobilinogen, Ur: 4 mg/dL — ABNORMAL HIGH (ref 0.2–1.0)
pH, UA: 7 (ref 5.0–7.5)

## 2024-08-07 LAB — MICROSCOPIC EXAMINATION
RBC, Urine: >30 /HPF — AB (ref 0–2)
WBC, UA: >30 /HPF — AB (ref 0–5)

## 2024-08-07 LAB — BLADDER SCAN AMB NON-IMAGING: Scan Result: 180

## 2024-08-07 MED ORDER — DOXYCYCLINE HYCLATE 100 MG PO CAPS
100.0000 mg | ORAL_CAPSULE | Freq: Two times a day (BID) | ORAL | 0 refills | Status: DC
Start: 2024-08-07 — End: 2024-08-29

## 2024-08-07 NOTE — Addendum Note (Signed)
 Addended byBETHA MALACHY SLICE on: 08/07/2024 12:07 PM   Modules accepted: Orders

## 2024-08-07 NOTE — Telephone Encounter (Signed)
   Urologic History:  Any Recent Urologic Surgeries or Procedures:no Recurrent UTI's:yes Cystitis: no  Prostatitis:no Kidney or Bladder Stones: no Plan: Walk-in Clinic: yes Appointment w/Physician: [no Lab visit scheduled for urine drop off: Yes Advice given:  Do you take on daily medications for UTI suppression NO

## 2024-08-07 NOTE — Telephone Encounter (Addendum)
 Patient placed on nv schedule for ua/cx and pvr.

## 2024-08-07 NOTE — Progress Notes (Signed)
 Bladder Scan completed today.  Patient can void prior to the bladder scan. Bladder scan result: 180  Performed By: Carlos, CMA  Additional notes-  Pt in office today. Pt state's he has hesitancy,frequency, and  dripping when trying to void. Pt thinks he has a UTI.

## 2024-08-07 NOTE — Telephone Encounter (Signed)
 UA reviewed by Dr. Sherrilee. Patient started on doxycycline  100 mg bid x 7 days. Ua sent for culture.  Wife called and made aware of abt sent to pharmacy.  Wife voiced understanding.

## 2024-08-07 NOTE — Telephone Encounter (Signed)
 Dysuria  Patient called with c/o dysuria x 3-5 days days.  Pain: burning  Severity:8/10  Associated Signs and Symptoms:  Fever: noTemp. Chills: no Hematuria: no Urgency: yes Frequency: yes Hesitancy:yes Incontinence: yes Nausea: no Vomiting: no  Message sent to clinic staff to return call to patient to advise of plan.   Wanting to know if he can give a urine drop off?  Please advise.  Patient here in office.

## 2024-08-11 LAB — URINE CULTURE

## 2024-08-13 ENCOUNTER — Ambulatory Visit: Payer: Self-pay

## 2024-08-13 DIAGNOSIS — N390 Urinary tract infection, site not specified: Secondary | ICD-10-CM

## 2024-08-13 NOTE — Telephone Encounter (Signed)
 Called pt and gave urine culture results per MD Dahlstedt pt states he seen blood in his urine once yesterday pt stated that MD would be notified pt advised to continue abx pt voiced his understanding

## 2024-08-13 NOTE — Telephone Encounter (Signed)
 Called pt to let him know recommendations from MD Dahlstedt scheduled ua drop of pt wife confirmed appt

## 2024-08-13 NOTE — Addendum Note (Signed)
 Addended by: SAMMIE EXIE HERO on: 08/13/2024 03:21 PM   Modules accepted: Orders

## 2024-08-13 NOTE — Telephone Encounter (Signed)
-----   Message from Garnette HERO Dahlstedt sent at 08/13/2024 12:45 PM EDT ----- Please call patient-the antibiotic sent in should work for his infection ----- Message ----- From: Rebecka, [facility] Lab Results In Sent: 08/07/2024   3:36 PM EDT To: Garnette Shack, MD

## 2024-08-13 NOTE — Telephone Encounter (Signed)
-----   Message from Garnette HERO Dahlstedt sent at 08/13/2024  2:31 PM EDT ----- Not unusual to have blood in the urine with an infection.  I would think it worthwhile for him to drop by a couple of weeks after the antibiotic is completed to recheck urinalysis. ----- Message ----- From: Sammie Exie HERO, CMA Sent: 08/13/2024   1:52 PM EDT To: Garnette Shack, MD  ----- Message from Exie HERO Sammie, CMA sent at 08/13/2024  1:52 PM EDT -----  RICK and advise if needed  ----- Message ----- From: Shack Garnette, MD Sent: 08/13/2024  12:45 PM EDT To: Exie HERO Sammie, CMA  Please call patient-the antibiotic sent in should work for his infection ----- Message ----- From: Interface, [facility] Lab Results In Sent: 08/07/2024   3:36 PM EDT To: Garnette Shack, MD

## 2024-08-16 DIAGNOSIS — I2510 Atherosclerotic heart disease of native coronary artery without angina pectoris: Secondary | ICD-10-CM | POA: Diagnosis not present

## 2024-08-16 DIAGNOSIS — E782 Mixed hyperlipidemia: Secondary | ICD-10-CM | POA: Diagnosis not present

## 2024-08-16 DIAGNOSIS — M1991 Primary osteoarthritis, unspecified site: Secondary | ICD-10-CM | POA: Diagnosis not present

## 2024-08-16 DIAGNOSIS — C672 Malignant neoplasm of lateral wall of bladder: Secondary | ICD-10-CM | POA: Diagnosis not present

## 2024-08-16 DIAGNOSIS — B351 Tinea unguium: Secondary | ICD-10-CM | POA: Diagnosis not present

## 2024-08-16 DIAGNOSIS — N401 Enlarged prostate with lower urinary tract symptoms: Secondary | ICD-10-CM | POA: Diagnosis not present

## 2024-08-16 DIAGNOSIS — M79676 Pain in unspecified toe(s): Secondary | ICD-10-CM | POA: Diagnosis not present

## 2024-08-16 DIAGNOSIS — K219 Gastro-esophageal reflux disease without esophagitis: Secondary | ICD-10-CM | POA: Diagnosis not present

## 2024-08-16 DIAGNOSIS — H353110 Nonexudative age-related macular degeneration, right eye, stage unspecified: Secondary | ICD-10-CM | POA: Diagnosis not present

## 2024-08-16 DIAGNOSIS — E1169 Type 2 diabetes mellitus with other specified complication: Secondary | ICD-10-CM | POA: Diagnosis not present

## 2024-08-16 DIAGNOSIS — E1142 Type 2 diabetes mellitus with diabetic polyneuropathy: Secondary | ICD-10-CM | POA: Diagnosis not present

## 2024-08-16 DIAGNOSIS — I10 Essential (primary) hypertension: Secondary | ICD-10-CM | POA: Diagnosis not present

## 2024-08-21 DIAGNOSIS — I2510 Atherosclerotic heart disease of native coronary artery without angina pectoris: Secondary | ICD-10-CM | POA: Diagnosis not present

## 2024-08-21 DIAGNOSIS — E782 Mixed hyperlipidemia: Secondary | ICD-10-CM | POA: Diagnosis not present

## 2024-08-21 DIAGNOSIS — I10 Essential (primary) hypertension: Secondary | ICD-10-CM | POA: Diagnosis not present

## 2024-08-21 DIAGNOSIS — E1169 Type 2 diabetes mellitus with other specified complication: Secondary | ICD-10-CM | POA: Diagnosis not present

## 2024-08-28 ENCOUNTER — Other Ambulatory Visit

## 2024-08-29 ENCOUNTER — Other Ambulatory Visit

## 2024-08-29 ENCOUNTER — Other Ambulatory Visit: Payer: Self-pay

## 2024-08-29 ENCOUNTER — Telehealth: Payer: Self-pay

## 2024-08-29 ENCOUNTER — Other Ambulatory Visit: Payer: Self-pay | Admitting: Urology

## 2024-08-29 DIAGNOSIS — N390 Urinary tract infection, site not specified: Secondary | ICD-10-CM | POA: Diagnosis not present

## 2024-08-29 LAB — MICROSCOPIC EXAMINATION
RBC, Urine: >30 /HPF — AB (ref 0–2)
WBC, UA: >30 /HPF — AB (ref 0–5)

## 2024-08-29 LAB — URINALYSIS, ROUTINE W REFLEX MICROSCOPIC
Bilirubin, UA: NEGATIVE
Glucose, UA: NEGATIVE
Nitrite, UA: POSITIVE — AB
Specific Gravity, UA: 1.02 (ref 1.005–1.030)
Urobilinogen, Ur: 4 mg/dL — ABNORMAL HIGH (ref 0.2–1.0)
pH, UA: 7.5 (ref 5.0–7.5)

## 2024-08-29 MED ORDER — SULFAMETHOXAZOLE-TRIMETHOPRIM 800-160 MG PO TABS: 1.0000 | ORAL_TABLET | Freq: Two times a day (BID) | ORAL | 0 refills | Status: DC

## 2024-08-29 NOTE — Telephone Encounter (Signed)
 Patient presents today with complaints of  Recurrent UTI.  UA and Culture done today.  Dr. Sherrilee reviewed results and Bactrim  DS BID X 7 days .  Patient aware of MD recommendations and that we will reach out with culture results.      [email or web address], CMA

## 2024-08-31 DIAGNOSIS — E871 Hypo-osmolality and hyponatremia: Secondary | ICD-10-CM | POA: Diagnosis not present

## 2024-08-31 DIAGNOSIS — R5383 Other fatigue: Secondary | ICD-10-CM | POA: Diagnosis not present

## 2024-08-31 DIAGNOSIS — E1169 Type 2 diabetes mellitus with other specified complication: Secondary | ICD-10-CM | POA: Diagnosis not present

## 2024-08-31 DIAGNOSIS — E7849 Other hyperlipidemia: Secondary | ICD-10-CM | POA: Diagnosis not present

## 2024-09-03 ENCOUNTER — Ambulatory Visit: Payer: Self-pay

## 2024-09-03 DIAGNOSIS — Z0001 Encounter for general adult medical examination with abnormal findings: Secondary | ICD-10-CM | POA: Diagnosis not present

## 2024-09-03 DIAGNOSIS — Z1389 Encounter for screening for other disorder: Secondary | ICD-10-CM | POA: Diagnosis not present

## 2024-09-03 DIAGNOSIS — Z1331 Encounter for screening for depression: Secondary | ICD-10-CM | POA: Diagnosis not present

## 2024-09-03 DIAGNOSIS — Z0000 Encounter for general adult medical examination without abnormal findings: Secondary | ICD-10-CM | POA: Diagnosis not present

## 2024-09-03 DIAGNOSIS — Z6833 Body mass index (BMI) 33.0-33.9, adult: Secondary | ICD-10-CM | POA: Diagnosis not present

## 2024-09-03 LAB — URINE CULTURE

## 2024-09-03 NOTE — Telephone Encounter (Signed)
 Opened in error

## 2024-09-05 DIAGNOSIS — Z6833 Body mass index (BMI) 33.0-33.9, adult: Secondary | ICD-10-CM | POA: Diagnosis not present

## 2024-09-05 DIAGNOSIS — E1169 Type 2 diabetes mellitus with other specified complication: Secondary | ICD-10-CM | POA: Diagnosis not present

## 2024-09-05 DIAGNOSIS — Z23 Encounter for immunization: Secondary | ICD-10-CM | POA: Diagnosis not present

## 2024-09-05 DIAGNOSIS — I700 Atherosclerosis of aorta: Secondary | ICD-10-CM | POA: Diagnosis not present

## 2024-09-05 DIAGNOSIS — E538 Deficiency of other specified B group vitamins: Secondary | ICD-10-CM | POA: Diagnosis not present

## 2024-09-05 DIAGNOSIS — E782 Mixed hyperlipidemia: Secondary | ICD-10-CM | POA: Diagnosis not present

## 2024-09-05 DIAGNOSIS — I2583 Coronary atherosclerosis due to lipid rich plaque: Secondary | ICD-10-CM | POA: Diagnosis not present

## 2024-09-11 DIAGNOSIS — I10 Essential (primary) hypertension: Secondary | ICD-10-CM | POA: Diagnosis not present

## 2024-09-11 DIAGNOSIS — H353110 Nonexudative age-related macular degeneration, right eye, stage unspecified: Secondary | ICD-10-CM | POA: Diagnosis not present

## 2024-09-11 DIAGNOSIS — E1142 Type 2 diabetes mellitus with diabetic polyneuropathy: Secondary | ICD-10-CM | POA: Diagnosis not present

## 2024-09-11 DIAGNOSIS — N401 Enlarged prostate with lower urinary tract symptoms: Secondary | ICD-10-CM | POA: Diagnosis not present

## 2024-09-11 DIAGNOSIS — E1169 Type 2 diabetes mellitus with other specified complication: Secondary | ICD-10-CM | POA: Diagnosis not present

## 2024-09-11 DIAGNOSIS — K219 Gastro-esophageal reflux disease without esophagitis: Secondary | ICD-10-CM | POA: Diagnosis not present

## 2024-09-11 DIAGNOSIS — M1991 Primary osteoarthritis, unspecified site: Secondary | ICD-10-CM | POA: Diagnosis not present

## 2024-09-11 DIAGNOSIS — C672 Malignant neoplasm of lateral wall of bladder: Secondary | ICD-10-CM | POA: Diagnosis not present

## 2024-09-11 DIAGNOSIS — M79676 Pain in unspecified toe(s): Secondary | ICD-10-CM | POA: Diagnosis not present

## 2024-09-11 DIAGNOSIS — B351 Tinea unguium: Secondary | ICD-10-CM | POA: Diagnosis not present

## 2024-09-11 DIAGNOSIS — E782 Mixed hyperlipidemia: Secondary | ICD-10-CM | POA: Diagnosis not present

## 2024-09-11 DIAGNOSIS — I2510 Atherosclerotic heart disease of native coronary artery without angina pectoris: Secondary | ICD-10-CM | POA: Diagnosis not present

## 2024-09-19 ENCOUNTER — Telehealth: Payer: Self-pay | Admitting: Urology

## 2024-09-19 DIAGNOSIS — N390 Urinary tract infection, site not specified: Secondary | ICD-10-CM

## 2024-09-19 NOTE — Telephone Encounter (Signed)
 Wife left message patient has blood in his urine

## 2024-09-19 NOTE — Telephone Encounter (Signed)
 Called pt wife to let her know that per verbal from MD McKenzie pt needs another UA and culture pt wife voiced her understanding

## 2024-09-20 ENCOUNTER — Other Ambulatory Visit

## 2024-09-20 DIAGNOSIS — N390 Urinary tract infection, site not specified: Secondary | ICD-10-CM | POA: Diagnosis not present

## 2024-09-20 LAB — URINALYSIS, ROUTINE W REFLEX MICROSCOPIC
Bilirubin, UA: NEGATIVE
Glucose, UA: NEGATIVE
Nitrite, UA: NEGATIVE
Specific Gravity, UA: 1.015 (ref 1.005–1.030)
Urobilinogen, Ur: 2 mg/dL — ABNORMAL HIGH (ref 0.2–1.0)
pH, UA: 7.5 (ref 5.0–7.5)

## 2024-09-20 LAB — MICROSCOPIC EXAMINATION
RBC, Urine: >30 /HPF — AB (ref 0–2)
WBC, UA: >30 /HPF — AB (ref 0–5)

## 2024-09-21 DIAGNOSIS — F4321 Adjustment disorder with depressed mood: Secondary | ICD-10-CM | POA: Diagnosis not present

## 2024-09-21 DIAGNOSIS — E782 Mixed hyperlipidemia: Secondary | ICD-10-CM | POA: Diagnosis not present

## 2024-09-21 DIAGNOSIS — E1169 Type 2 diabetes mellitus with other specified complication: Secondary | ICD-10-CM | POA: Diagnosis not present

## 2024-09-21 DIAGNOSIS — I10 Essential (primary) hypertension: Secondary | ICD-10-CM | POA: Diagnosis not present

## 2024-09-21 MED ORDER — DOXYCYCLINE HYCLATE 100 MG PO CAPS: 100.0000 mg | ORAL_CAPSULE | Freq: Two times a day (BID) | ORAL | 0 refills | Status: DC

## 2024-09-21 NOTE — Addendum Note (Signed)
 Addended by: SAMMIE EXIE HERO on: 09/21/2024 01:39 PM   Modules accepted: Orders

## 2024-09-21 NOTE — Telephone Encounter (Signed)
 Called pt wife to let him know per verbal from MD McKenzie pt needs a longer course of abx, pt needs doxy for 28 days. Pt wife voiced his understanding and confirmed pharmacy

## 2024-09-24 ENCOUNTER — Encounter: Payer: Self-pay | Admitting: Podiatry

## 2024-09-24 ENCOUNTER — Ambulatory Visit: Admitting: Podiatry

## 2024-09-24 DIAGNOSIS — E1169 Type 2 diabetes mellitus with other specified complication: Secondary | ICD-10-CM

## 2024-09-24 DIAGNOSIS — M79674 Pain in right toe(s): Secondary | ICD-10-CM | POA: Diagnosis not present

## 2024-09-24 DIAGNOSIS — M79675 Pain in left toe(s): Secondary | ICD-10-CM

## 2024-09-24 DIAGNOSIS — B351 Tinea unguium: Secondary | ICD-10-CM

## 2024-09-24 LAB — URINE CULTURE

## 2024-09-24 NOTE — Progress Notes (Signed)

## 2024-10-08 DIAGNOSIS — E538 Deficiency of other specified B group vitamins: Secondary | ICD-10-CM | POA: Diagnosis not present

## 2024-10-12 DIAGNOSIS — C672 Malignant neoplasm of lateral wall of bladder: Secondary | ICD-10-CM | POA: Diagnosis not present

## 2024-10-12 DIAGNOSIS — N401 Enlarged prostate with lower urinary tract symptoms: Secondary | ICD-10-CM | POA: Diagnosis not present

## 2024-10-12 DIAGNOSIS — M1991 Primary osteoarthritis, unspecified site: Secondary | ICD-10-CM | POA: Diagnosis not present

## 2024-10-12 DIAGNOSIS — K219 Gastro-esophageal reflux disease without esophagitis: Secondary | ICD-10-CM | POA: Diagnosis not present

## 2024-10-12 DIAGNOSIS — M79676 Pain in unspecified toe(s): Secondary | ICD-10-CM | POA: Diagnosis not present

## 2024-10-12 DIAGNOSIS — E782 Mixed hyperlipidemia: Secondary | ICD-10-CM | POA: Diagnosis not present

## 2024-10-12 DIAGNOSIS — I2510 Atherosclerotic heart disease of native coronary artery without angina pectoris: Secondary | ICD-10-CM | POA: Diagnosis not present

## 2024-10-12 DIAGNOSIS — B351 Tinea unguium: Secondary | ICD-10-CM | POA: Diagnosis not present

## 2024-10-12 DIAGNOSIS — E1169 Type 2 diabetes mellitus with other specified complication: Secondary | ICD-10-CM | POA: Diagnosis not present

## 2024-10-12 DIAGNOSIS — I10 Essential (primary) hypertension: Secondary | ICD-10-CM | POA: Diagnosis not present

## 2024-10-12 DIAGNOSIS — E1142 Type 2 diabetes mellitus with diabetic polyneuropathy: Secondary | ICD-10-CM | POA: Diagnosis not present

## 2024-10-12 DIAGNOSIS — H353110 Nonexudative age-related macular degeneration, right eye, stage unspecified: Secondary | ICD-10-CM | POA: Diagnosis not present

## 2024-10-15 ENCOUNTER — Telehealth: Payer: Self-pay | Admitting: Student

## 2024-10-15 NOTE — Telephone Encounter (Signed)
 Delford Maude BROCKS, MD to [facility] Triage  Shlomo Wilbert SAUNDERS, MD (Selected Message)     10/15/24 12:43 PM Nahser patient. Can f/u with Wonda who has seen before. Had multiple stents most recent 02/2024 ok to hold eliquis  but needs to stay on plavix  monotherapy.    Left detailed message for Lyle with [facility] of the above information and to have the pt to contact the office to schedule appt with re-establish since Dr Alveta has retired.

## 2024-10-15 NOTE — Telephone Encounter (Signed)
 Calling about a hematuria pt has been taking doxy for. He has stopped his Eliquis  5mg , but still taking Plavix  75mg  daily. Provider at [facility] would like to know if he could stop one or the other, or reduce dosages. Please advise.      Brooke with [facility]  [phone]

## 2024-10-15 NOTE — Telephone Encounter (Signed)
 Sorry, I helped discharge this patient back in 01/2024 but otherwise have not been involved in his care. He has had multiple stents placed in the past and has atrial fibrillation with a CHA2DS2-VASc score of 4. Therefore, I would defer decision to hold Plavix  or Eliquis  to MD. Looks like he was previously seen by Dr. Alveta and has not gotten established with anyone else yet, so I guess it will have to go to the DOD.  Thank you! Labib Cwynar

## 2024-10-21 DIAGNOSIS — I10 Essential (primary) hypertension: Secondary | ICD-10-CM | POA: Diagnosis not present

## 2024-10-21 DIAGNOSIS — E782 Mixed hyperlipidemia: Secondary | ICD-10-CM | POA: Diagnosis not present

## 2024-10-21 DIAGNOSIS — E1169 Type 2 diabetes mellitus with other specified complication: Secondary | ICD-10-CM | POA: Diagnosis not present

## 2024-10-21 DIAGNOSIS — N401 Enlarged prostate with lower urinary tract symptoms: Secondary | ICD-10-CM | POA: Diagnosis not present

## 2024-10-26 NOTE — Telephone Encounter (Signed)
 Spoke with patient, following up with Eliquis  and Plavix . Pt stopped Eliquis  already due to hematuria but is still taking Plavix , as Dr. Nishan recommended. Appt made with Dr Wonda and gave pt instructions in regards to [facility].   Powell, RN [facility] Triage

## 2024-10-29 ENCOUNTER — Encounter: Payer: Self-pay | Admitting: Cardiovascular Disease

## 2024-10-29 ENCOUNTER — Ambulatory Visit: Attending: Cardiovascular Disease | Admitting: Cardiovascular Disease

## 2024-10-29 ENCOUNTER — Telehealth: Payer: Self-pay

## 2024-10-29 VITALS — BP 108/67 | HR 63 | Ht 66.5 in | Wt 231.0 lb

## 2024-10-29 DIAGNOSIS — I2510 Atherosclerotic heart disease of native coronary artery without angina pectoris: Secondary | ICD-10-CM

## 2024-10-29 DIAGNOSIS — E782 Mixed hyperlipidemia: Secondary | ICD-10-CM | POA: Diagnosis not present

## 2024-10-29 DIAGNOSIS — I480 Paroxysmal atrial fibrillation: Secondary | ICD-10-CM | POA: Diagnosis not present

## 2024-10-29 NOTE — Assessment & Plan Note (Signed)
 Currently stable on amlodipine , clopidogrel , atorvastatin , isosorbide , and metoprolol .  Continue current management.

## 2024-10-29 NOTE — Telephone Encounter (Signed)
 Spoke with pt in office pt stated he is not having any more symptoms pt advised he does not need another urine analysis

## 2024-10-29 NOTE — Progress Notes (Signed)
 Cardiology Office Note:    Date:  10/29/2024   ID:  Kyle Lambert April 23, 1951, MRN [id number]  PCP:  Kyle Elspeth BRAVO, MD   [facility] Providers Cardiologist:  Kyle Fell, MD     Referring MD: Kyle Elspeth BRAVO, MD   Chief Complaint  Patient presents with   Coronary Artery Disease    History of Present Illness:    Kyle Lambert is a 73 y.o. male with a hx of paroxysmal atrial fibrillation and CAD/coronary stenting, presenting for follow-up evaluation.   The patient is here with his wife today.  He denies having any recent chest pain or pressure.  No shortness of breath, orthopnea, PND, heart palpitation, or leg swelling.  He reports recurrent issues with urinary tract infections.  He has had hematuria requiring him to stop taking apixaban  about a week ago.  States that his symptoms have now cleared up but often times recur once he is back on the medication.  He remains on clopidogrel .  He has been followed by urology and just completed a 28-day course of doxycycline .   Current Medications: Current Meds  Medication Sig   amLODipine  (NORVASC ) 5 MG tablet TAKE 1 TABLET DAILY   atorvastatin  (LIPITOR ) 40 MG tablet Take 40 mg by mouth at bedtime.    clopidogrel  (PLAVIX ) 75 MG tablet Take 1 tablet by mouth once daily   cyclobenzaprine  (FLEXERIL ) 10 MG tablet Take 10 mg by mouth daily as needed for muscle spasms.   Ferrous Sulfate  (IRON ) 325 (65 Fe) MG TABS Take 1 tablet (325 mg total) by mouth 2 (two) times daily.   isosorbide  mononitrate (IMDUR ) 120 MG 24 hr tablet Take 1 tablet (120 mg total) by mouth daily.   meclizine (ANTIVERT) 25 MG tablet Take 25 mg by mouth 3 (three) times daily as needed for dizziness.   metFORMIN (GLUCOPHAGE) 1000 MG tablet Take 1,000 mg by mouth 2 (two) times daily.   metoprolol  tartrate (LOPRESSOR ) 25 MG tablet TAKE 1 TABLET TWICE A DAY   Multiple Vitamins-Minerals (PRESERVISION AREDS 2 PO) Take 1 tablet by mouth in the morning and at  bedtime.   nitroGLYCERIN  (NITROSTAT ) 0.4 MG SL tablet Place 1 tablet (0.4 mg total) under the tongue every 5 (five) minutes x 3 doses as needed for chest pain.   ondansetron  (ZOFRAN -ODT) 4 MG disintegrating tablet Take 1 tablet (4 mg total) by mouth every 8 (eight) hours as needed for nausea or vomiting.   oxyCODONE -acetaminophen  (PERCOCET) 5-325 MG tablet Take 1 tablet by mouth every 4 (four) hours as needed.   pantoprazole  (PROTONIX ) 40 MG tablet TAKE 1 TABLET BY MOUTH ONCE DAILY --  NEEDS  APPOINTMENT  FOR  FURTHER  REFILLS (Patient taking differently: Take 40 mg by mouth daily.)   silodosin  (RAPAFLO ) 8 MG CAPS capsule Take 8 mg by mouth daily with breakfast.   tamsulosin  (FLOMAX ) 0.4 MG CAPS capsule Take 0.4 mg by mouth 2 (two) times daily.     Allergies:   Patient has no known allergies.   ROS:   Please see the history of present illness.    All other systems reviewed and are negative.  EKGs/Labs/Other Studies Reviewed:    The following studies were reviewed today: Cardiac Studies & Procedures   ______________________________________________________________________________________________ CARDIAC CATHETERIZATION  CARDIAC CATHETERIZATION 02/23/2024  Conclusion   Mid Cx to Dist Cx lesion is 40% stenosed.   Prox RCA lesion is 10% stenosed.   Mid RCA lesion is 40% stenosed.   Mid RCA  to Dist RCA lesion is 30% stenosed.   Ost 1st Diag lesion is 50% stenosed.   Ost RPDA to RPDA lesion is 50% stenosed.   Ost 1st Mrg to 1st Mrg lesion is 80% stenosed.   Previously placed Ost LAD to Prox LAD stent of unknown type is  widely patent.   Non-stenotic Mid LAD lesion was previously treated.   A drug-eluting stent was successfully placed using a SYNERGY XD 2.25X20.   Post intervention, there is a 0% residual stenosis.  Patent ostial/proximal LAD stent. Patent mid LAD stent Severe stenosis first obtuse marginal branch. Successful PTCA/DES x 1 obtuse marginal branch The large, dominant RCA  has a patent proximal stented segment with minimal restenosis. Moderate stenosis in the PDA  Recommendations: Continue triple therapy with ASA/Plavix  for one month along with Eliquis . Stop ASA in one month and continue Plavix /Eliquis . Same day post PCI discharge.  Findings Coronary Findings Diagnostic  Dominance: Right  Left Anterior Descending Vessel is large. Previously placed Ost LAD to Prox LAD stent of unknown type is  widely patent. Non-stenotic Mid LAD lesion was previously treated.  First Diagonal Branch Ost 1st Diag lesion is 50% stenosed.  Left Circumflex Mid Cx to Dist Cx lesion is 40% stenosed. The lesion is eccentric.  First Obtuse Marginal Branch Ost 1st Mrg to 1st Mrg lesion is 80% stenosed.  Right Coronary Artery Prox RCA lesion is 10% stenosed. The lesion was previously treated using a drug eluting stent over 2 years ago. Mid RCA lesion is 40% stenosed. Mid RCA to Dist RCA lesion is 30% stenosed.  Right Posterior Descending Artery Ost RPDA to RPDA lesion is 50% stenosed.  Intervention  Ost 1st Mrg to 1st Mrg lesion Stent CATH VISTA GUIDE 6FR XB3.5 EPK guide catheter was inserted. Lesion crossed with guidewire using a WIRE ASAHI PROWATER 180CM. Pre-stent angioplasty was performed using a BALLN EMERGE MR 2.0X15. A drug-eluting stent was successfully placed using a SYNERGY XD 2.25X20. Post-stent angioplasty was performed using a BALLN [place] EMERGE MR 2.5X15. Post-Intervention Lesion Assessment The intervention was successful. Pre-interventional TIMI flow is 3. Post-intervention TIMI flow is 3. No complications occurred at this lesion. There is a 0% residual stenosis post intervention.   CARDIAC CATHETERIZATION  CARDIAC CATHETERIZATION 02/17/2024  Conclusion   Mid Cx to Dist Cx lesion is 40% stenosed.   Ost 1st Mrg to 1st Mrg lesion is 60% stenosed.   Dist LM to Ost LAD lesion is 90% stenosed.   Prox LAD lesion is 70% stenosed.   Previously placed Mid LAD  stent of unknown type is  widely patent.   A drug-eluting stent was successfully placed using a STENT ONYX FRONTIER 3.0X30.  Severe heavily calcified ostial/proximal LAD stenosis. Successful PTCA/Shockwave IC lithotripsy/DES x 1 ostial/proximal LAD Patent distal LAD stent Moderate non-obstructive disease in the diagonal branch The Circumflex is a large caliber vessel with a large obtuse marginal branch. The obtuse marginal branch has moderate proximal stenosis. The large, dominant RCA has a patent proximal stented segment with minimal restenosis. Moderate stenosis in the PDA LVEDP=13 mmHg  Recommendations: Continue DAPT with ASA and Plavix  for one month. Then stop ASA and continue Plavix  along with Eliquis . (Discharge on triple therapy: ASA, Plavix , Eliquis ). Medical management of moderate stenosis in the obtuse marginal branch.  Findings Coronary Findings Diagnostic  Dominance: Right  Left Main Dist LM to Ost LAD lesion is 90% stenosed. The lesion is calcified.  Left Anterior Descending Vessel is large. Prox LAD lesion is 70%  stenosed. The lesion is calcified. Previously placed Mid LAD stent of unknown type is  widely patent.  First Diagonal Branch Ost 1st Diag lesion is 50% stenosed.  Left Circumflex Mid Cx to Dist Cx lesion is 40% stenosed. The lesion is eccentric.  First Obtuse Marginal Branch Ost 1st Mrg to 1st Mrg lesion is 60% stenosed.  Right Coronary Artery Prox RCA lesion is 10% stenosed. The lesion was previously treated using a drug eluting stent over 2 years ago. Mid RCA lesion is 40% stenosed. Mid RCA to Dist RCA lesion is 30% stenosed.  Right Posterior Descending Artery Ost RPDA to RPDA lesion is 50% stenosed.  Intervention  Dist LM to Ost LAD lesion Stent (Also treats lesions: Prox LAD) CATH VISTA GUIDE 6FR XBLD 3.5 guide catheter was inserted. Lesion crossed with guidewire using a WIRE ASAHI PROWATER 180CM. Pre-stent angioplasty was performed using a  BALLN EMERGE MR 2.0X12.  A second angioplasty balloon was used, using a BALL [place]PH[place] NC24 3.0X15.A drug-eluting stent was successfully placed using a STENT ONYX FRONTIER 3.0X30. Stent strut is well apposed. Post-stent angioplasty was performed using a BALLN [place] EMERGE MR 3.5X20. Post-Intervention Lesion Assessment The intervention was successful. Pre-interventional TIMI flow is 3. Post-intervention TIMI flow is 3. No complications occurred at this lesion. Shockwave 3.0 x 10 mm balloon pre stent placement x 70 pulses in proximal LAD. There is a 0% residual stenosis post intervention.  Prox LAD lesion Stent (Also treats lesions: Dist LM to Ost LAD) See details in Dist LM to Ost LAD lesion. Post-Intervention Lesion Assessment The intervention was successful. Pre-interventional TIMI flow is 3. Post-intervention TIMI flow is 3. No complications occurred at this lesion. There is a 0% residual stenosis post intervention.   STRESS TESTS  MYOCARDIAL PERFUSION IMAGING 06/07/2023  Interpretation Summary   The study is normal. The study is low risk.   No ST deviation was noted.   LV perfusion is normal.   Left ventricular function is normal. Nuclear stress EF: 69%. The left ventricular ejection fraction is hyperdynamic (>65%). End diastolic cavity size is normal.   Prior study available for comparison from 08/30/2017. There are changes compared to prior study which appear to be improved with no ischemia present.  Low risk stress nuclear study with normal perfusion and normal left ventricular regional and global systolic function.   ECHOCARDIOGRAM  ECHOCARDIOGRAM COMPLETE 02/17/2024  Narrative ECHOCARDIOGRAM REPORT    Patient Name:   Kyle Lambert Date of Exam: 02/17/2024 Medical Rec #:  [id number]     Height:       69.0 in Accession #:    [id number]    Weight:       224.8 lb Date of Birth:  28-May-1951     BSA:          2.171 m Patient Age:    72 years      BP:           121/69 mmHg Patient  Gender: M             HR:           61 bpm. Exam Location:  Inpatient  Procedure: 2D Echo, Cardiac Doppler, Color Doppler and Intracardiac Opacification Agent (Both Spectral and Color Flow Doppler were utilized during procedure).  Indications:    Chest pain  History:        Patient has prior history of Echocardiogram examinations, most recent 06/07/2023. CAD; Risk Factors:Diabetes, Hypertension and Dyslipidemia. NSTEMI.  Sonographer:  Sabrina Gentry Referring Phys: ZANE ADAMS   Sonographer Comments: Suboptimal subcostal window, suboptimal parasternal window and suboptimal apical window. IMPRESSIONS   1. Left ventricular ejection fraction, by estimation, is 60 to 65%. The left ventricle has normal function. The left ventricle has no regional wall motion abnormalities. Left ventricular diastolic parameters were normal. 2. Right ventricular systolic function is normal. The right ventricular size is normal. 3. The mitral valve is normal in structure. Trivial mitral valve regurgitation. No evidence of mitral stenosis. 4. The aortic valve is tricuspid. Aortic valve regurgitation is not visualized. No aortic stenosis is present.  FINDINGS Left Ventricle: Left ventricular ejection fraction, by estimation, is 60 to 65%. The left ventricle has normal function. The left ventricle has no regional wall motion abnormalities. The left ventricular internal cavity size was normal in size. There is no left ventricular hypertrophy. Left ventricular diastolic parameters were normal.  Right Ventricle: The right ventricular size is normal. No increase in right ventricular wall thickness. Right ventricular systolic function is normal.  Left Atrium: Left atrial size was normal in size.  Right Atrium: Right atrial size was normal in size.  Pericardium: Trivial pericardial effusion is present.  Mitral Valve: The mitral valve is normal in structure. Trivial mitral valve regurgitation. No evidence of mitral  valve stenosis. MV peak gradient, 1.8 mmHg. The mean mitral valve gradient is 1.0 mmHg.  Tricuspid Valve: The tricuspid valve is not well visualized. Tricuspid valve regurgitation is trivial. No evidence of tricuspid stenosis.  Aortic Valve: The aortic valve is tricuspid. Aortic valve regurgitation is not visualized. No aortic stenosis is present. Aortic valve mean gradient measures 2.0 mmHg. Aortic valve peak gradient measures 4.1 mmHg. Aortic valve area, by VTI measures 3.87 cm.  Pulmonic Valve: The pulmonic valve was not well visualized. Pulmonic valve regurgitation is not visualized. No evidence of pulmonic stenosis.  Aorta: The aortic root and ascending aorta are structurally normal, with no evidence of dilitation.  Venous: The inferior vena cava was not well visualized.  IAS/Shunts: The interatrial septum was not well visualized.   LEFT VENTRICLE PLAX 2D LVIDd:         4.20 cm      Diastology LVIDs:         2.70 cm      LV e' medial:    5.98 cm/s LV PW:         1.00 cm      LV E/e' medial:  10.1 LV IVS:        0.90 cm      LV e' lateral:   6.09 cm/s LVOT diam:     2.10 cm      LV E/e' lateral: 9.9 LV SV:         76 LV SV Index:   35 LVOT Area:     3.46 cm  LV Volumes (MOD) LV vol d, MOD A2C: 81.7 ml LV vol d, MOD A4C: 157.0 ml LV vol s, MOD A2C: 28.8 ml LV vol s, MOD A4C: 47.2 ml LV SV MOD A2C:     52.9 ml LV SV MOD A4C:     157.0 ml LV SV MOD BP:      77.5 ml  RIGHT VENTRICLE RV Basal diam:  3.60 cm RV Mid diam:    1.40 cm RV S prime:     10.00 cm/s  LEFT ATRIUM             Index        RIGHT  ATRIUM          Index LA diam:        3.00 cm 1.38 cm/m   RA Area:     8.44 cm LA Vol (A2C):   64.7 ml 29.80 ml/m  RA Volume:   11.60 ml 5.34 ml/m LA Vol (A4C):   32.5 ml 14.97 ml/m LA Biplane Vol: 49.9 ml 22.98 ml/m AORTIC VALVE                    PULMONIC VALVE AV Area (Vmax):    3.57 cm     PV Vmax:       0.84 m/s AV Area (Vmean):   3.42 cm     PV Peak grad:   2.8 mmHg AV Area (VTI):     3.87 cm AV Vmax:           101.00 cm/s AV Vmean:          73.100 cm/s AV VTI:            0.195 m AV Peak Grad:      4.1 mmHg AV Mean Grad:      2.0 mmHg LVOT Vmax:         104.00 cm/s LVOT Vmean:        72.100 cm/s LVOT VTI:          0.218 m LVOT/AV VTI ratio: 1.12  AORTA Ao Root diam: 3.20 cm Ao Asc diam:  3.10 cm  MITRAL VALVE MV Area (PHT): 2.11 cm    SHUNTS MV Area VTI:   3.97 cm    Systemic VTI:  0.22 m MV Peak grad:  1.8 mmHg    Systemic Diam: 2.10 cm MV Mean grad:  1.0 mmHg MV Vmax:       0.67 m/s MV Vmean:      41.4 cm/s MV Decel Time: 359 msec MV E velocity: 60.30 cm/s MV A velocity: 71.80 cm/s MV E/A ratio:  0.84  Sunit Tolia Electronically signed by Madonna Large Signature Date/Time: 02/17/2024/12:45:21 PM    Final          ______________________________________________________________________________________________      EKG:        Recent Labs: 02/16/2024: ALT 19; Magnesium  1.5 02/18/2024: BUN 12; Creatinine, Ser 0.80; Hemoglobin 9.5; Platelets 241; Potassium 3.7; Sodium 136 03/01/2024: TSH 6.67  Recent Lipid Panel    Component Value Date/Time   CHOL 98 02/17/2024 0405   TRIG 61 02/17/2024 0405   HDL 36 (L) 02/17/2024 0405   CHOLHDL 2.7 02/17/2024 0405   VLDL 12 02/17/2024 0405   LDLCALC 50 02/17/2024 0405     Risk Assessment/Calculations:    CHA2DS2-VASc Score = 4   This indicates a 4.8% annual risk of stroke. The patient's score is based upon: CHF History: 0 HTN History: 1 Diabetes History: 1 Stroke History: 0 Vascular Disease History: 1 Age Score: 1 Gender Score: 0               Physical Exam:    VS:  BP 108/67   Pulse 63   Ht 5' 6.5 (1.689 m)   Wt 231 lb (104.8 kg)   SpO2 95%   BMI 36.73 kg/m     Wt Readings from Last 3 Encounters:  10/29/24 231 lb (104.8 kg)  05/23/24 227 lb 15.3 oz (103.4 kg)  03/15/24 230 lb 13.2 oz (104.7 kg)     GEN:  Well nourished, well developed in no  acute distress HEENT: Normal  NECK: No JVD; No carotid bruits LYMPHATICS: No lymphadenopathy CARDIAC: RRR, no murmurs, rubs, gallops RESPIRATORY:  Clear to auscultation without rales, wheezing or rhonchi  ABDOMEN: Soft, non-tender, non-distended MUSCULOSKELETAL:  No edema; No deformity  SKIN: Warm and dry NEUROLOGIC:  Alert and oriented x 3 PSYCHIATRIC:  Normal affect   Assessment & Plan Coronary artery disease involving native coronary artery of native heart without angina pectoris Currently stable on amlodipine , clopidogrel , atorvastatin , isosorbide , and metoprolol .  Continue current management. Mixed hyperlipidemia Treated with atorvastatin  40 mg daily.  Last lipids with an LDL cholesterol of 50.  Continue current management. Paroxysmal atrial fibrillation (HCC) Growing concerns about his ability to tolerate long-term oral anticoagulation.  I asked him to start back on apixaban  in 1 week as hopefully that will give him enough time to recover from his current urinary tract infection.  We discussed consideration of left atrial appendage occlusion as a stroke prevention therapy so that he could potentially avoid lifelong oral anticoagulation which she does not appear to be tolerating well at present.  He is interested in landscape architect evaluation and I will refer him to EP for consideration.  He understands that he would require short-term oral anticoagulation or DAPT following watchman implantation, but that after 6 weeks he could likely be continued on clopidogrel  monotherapy which would treat his coronary artery disease as well.  He has undergone complex coronary stenting and I am somewhat reluctant to have him off of all antiplatelet therapy.      Medication Adjustments/Labs and Tests Ordered: Current medicines are reviewed at length with the patient today.  Concerns regarding medicines are outlined above.  Orders Placed This Encounter  Procedures   Ambulatory referral to Cardiac  Electrophysiology   No orders of the defined types were placed in this encounter.   Patient Instructions  Medication Instructions:  Your physician recommends that you continue on your current medications as directed. Please refer to the Current Medication list given to you today.  Please try to resume Eliquis  twice daily in about 1 week.  *If you need a refill on your cardiac medications before your next appointment, please call your pharmacy*   Follow-Up: At [facility], you and your health needs are our priority.  As part of our continuing mission to provide you with exceptional heart care, our providers are all part of one team.  This team includes your primary Cardiologist (physician) and Advanced Practice Providers or APPs (Physician Assistants and Nurse Practitioners) who all work together to provide you with the care you need, when you need it.  Your next appointment:   6 month(s)  Provider:   One of our Advanced Practice Providers (APPs): Morse Clause, PA-C  Lamarr Satterfield, NP Miriam Shams, NP  Olivia Pavy, PA-C Josefa Beauvais, NP  Leontine Salen, PA-C Orren Fabry, PA-C  [place], PA-C Ernest Dick, NP  Damien Braver, NP Jon Hails, PA-C  Waddell Donath, PA-C    Dayna Dunn, PA-C  Scott Weaver, PA-C Lum Louis, NP Katlyn West, NP Callie Goodrich, PA-C  Xika Zhao, NP Sheng Haley, PA-C    Kathleen Johnson, PA-C   Then, Kyle Fell, MD will plan to see you again in 1 year(s).      Signed, Kyle Fell, MD  10/29/2024 12:43 PM    [facility]

## 2024-10-29 NOTE — Patient Instructions (Addendum)
 Medication Instructions:  Your physician recommends that you continue on your current medications as directed. Please refer to the Current Medication list given to you today.  Please try to resume Eliquis  twice daily in about 1 week.  *If you need a refill on your cardiac medications before your next appointment, please call your pharmacy*   Follow-Up: At [facility], you and your health needs are our priority.  As part of our continuing mission to provide you with exceptional heart care, our providers are all part of one team.  This team includes your primary Cardiologist (physician) and Advanced Practice Providers or APPs (Physician Assistants and Nurse Practitioners) who all work together to provide you with the care you need, when you need it.  Your next appointment:   6 month(s)  Provider:   One of our Advanced Practice Providers (APPs): Morse Clause, PA-C  Lamarr Satterfield, NP Miriam Shams, NP  Olivia Pavy, PA-C Josefa Beauvais, NP  Leontine Salen, PA-C Orren Fabry, PA-C  [place], PA-C Ernest Dick, NP  Damien Braver, NP Jon Hails, PA-C  Waddell Donath, PA-C    Dayna Dunn, PA-C  Scott Weaver, PA-C Lum Louis, NP Katlyn West, NP Callie Goodrich, PA-C  Xika Zhao, NP Sheng Haley, PA-C    Kathleen Johnson, PA-C   Then, Ozell Fell, MD will plan to see you again in 1 year(s).

## 2024-10-29 NOTE — Assessment & Plan Note (Signed)
 Treated with atorvastatin  40 mg daily.  Last lipids with an LDL cholesterol of 50.  Continue current management.

## 2024-10-29 NOTE — Telephone Encounter (Signed)
 Completed 28 day antibiotic.  Wanted to know if will need to give a urine specimen?  Please advise.

## 2024-10-31 ENCOUNTER — Telehealth: Payer: Self-pay

## 2024-10-31 NOTE — Telephone Encounter (Signed)
 Attempted to call patient to arrange Watchman consult. He was not home and will be back. Will try again later.

## 2024-10-31 NOTE — Telephone Encounter (Signed)
 Arranged watchman consult with Dr. [place] 12/12/24 at 10:30 AM.

## 2024-11-13 ENCOUNTER — Telehealth: Payer: Self-pay

## 2024-11-13 NOTE — Telephone Encounter (Signed)
 Return call to pt's wife whom state's pt is passing hematuria and his Cardiologist told him to stop his Eliqus. Pt/wife advised to go to urgent care or PCP for urinalysis and urine culture due to office closing . Voiced understanding

## 2024-12-12 ENCOUNTER — Ambulatory Visit: Admitting: Student in an Organized Health Care Education/Training Program

## 2024-12-12 ENCOUNTER — Encounter: Payer: Self-pay | Admitting: Student in an Organized Health Care Education/Training Program

## 2024-12-12 VITALS — BP 116/80 | HR 64 | Ht 66.5 in | Wt 230.0 lb

## 2024-12-12 DIAGNOSIS — Z01812 Encounter for preprocedural laboratory examination: Secondary | ICD-10-CM | POA: Diagnosis not present

## 2024-12-12 DIAGNOSIS — I480 Paroxysmal atrial fibrillation: Secondary | ICD-10-CM | POA: Diagnosis not present

## 2024-12-12 NOTE — Progress Notes (Signed)
 " Cardiology Office Note   Date: 12/12/24 ID:  Kyle Lambert, Kyle Lambert 12-15-1950, MRN [id number] PCP: Lari Elspeth BRAVO, MD  [facility] Providers Cardiologist:  Ozell Fell, MD Electrophysiologist:  Donnice DELENA Primus, MD    History of Present Illness Kyle Lambert is a 74 y.o. male with paroxysmal AF, CAD s/Lambert PCI OM, prior PCI RCA, HLD, DM2 and HTN who presents as a referral for consideration of left atrial appendage occlusion.  Discussed the use of AI scribe software for clinical note transcription with the patient, who gave verbal consent to proceed. History of Present Illness Kyle Lambert is a 74 year old male with atrial fibrillation who presents for evaluation of bleeding complications related to Eliquis  use. He is accompanied by his spouse, [name].  He has a history of atrial fibrillation and is currently experiencing hematuria associated with the use of Eliquis . He states that every time he takes Eliquis , he experiences hematuria, saying 'if I try to take it a couple of days, I'll be peeing blood.' Due to these side effects, he is not currently taking Eliquis .  He is currently on Plavix . He was previously taken off aspirin  but is open to resuming it if necessary.  ROS: recurrent hematuria   Studies Reviewed  ECG review 12/12/24: NSR 64, PR 214, QRS 88, QT/c 422/435 02/23/24: NSR 69, PR 222, QRS 86, QT/c [id number] 02/21/24: NSR 80, PR 192, QRS 90, QT/c 402/463 02/18/24: NSR 66, PR 198, QRS 88, QT/c 434/454 02/18/24: NSR 61, PR 198, QRS 84, QT/c 456/459 02/17/24: SB 55, PR 198, QRS 88, QT/c 492/470 02/16/24: AF/VR 105, QRS 85, Qtc 351/464  Coronary angiography  Result date: 02/23/24   Mid Cx to Dist Cx lesion is 40% stenosed.   Prox RCA lesion is 10% stenosed.   Mid RCA lesion is 40% stenosed.   Mid RCA to Dist RCA lesion is 30% stenosed.   Ost 1st Diag lesion is 50% stenosed.   Ost RPDA to RPDA lesion is 50% stenosed.   Ost 1st Mrg to 1st Mrg lesion is 80%  stenosed.   Previously placed Ost LAD to Prox LAD stent of unknown type is  widely patent.   Non-stenotic Mid LAD lesion was previously treated.   A drug-eluting stent was successfully placed using a SYNERGY XD 2.25X20.   Post intervention, there is a 0% residual stenosis. Patent ostial/proximal LAD stent. Patent mid LAD stent Severe stenosis first obtuse marginal branch.  Successful PTCA/DES x 1 obtuse marginal branch The large, dominant RCA has a patent proximal stented segment with minimal restenosis.  Moderate stenosis in the PDA  Risk Assessment/Calculations  CHA2DS2-VASc Score = 4  This indicates a 4.8% annual risk of stroke. The patient's score is based upon: CHF History: 0 HTN History: 1 Diabetes History: 1 Stroke History: 0 Vascular Disease History: 1 Age Score: 1 Gender Score: 0   Physical Exam VS:  BP 116/80 (BP Location: Left Arm, Patient Position: Sitting, Cuff Size: Large)   Pulse 64   Ht 5' 6.5 (1.689 m)   Wt 230 lb (104.3 kg)   SpO2 95%   BMI 36.57 kg/m   Wt Readings from Last 3 Encounters:  12/12/24 230 lb (104.3 kg)  10/29/24 231 lb (104.8 kg)  05/23/24 227 lb 15.3 oz (103.4 kg)    GEN: Well nourished, well developed in no acute distress NECK: No JVD; No carotid bruits CARDIAC: RRR, no murmurs, rubs, gallops RESPIRATORY:  Clear to auscultation without rales, wheezing or  rhonchi  ABDOMEN: Soft, non-tender, non-distended EXTREMITIES:  No edema; No deformity   ASSESSMENT AND PLAN Kyle Lambert is a 74 y.o. male with paroxysmal AF, CAD s/Lambert PCI OM, prior PCI RCA, HLD, DM2 and HTN who presents as a referral for consideration of left atrial appendage occlusion. Assessment & Plan Paroxysmal atrial fibrillation Intermittent atrial fibrillation with elevated stroke risk due to potential thrombus formation. Not on Eliquis  due to bleeding complications. - Continue Plavix , add ASA post procedure for 6 months if he doesn't have recurrent hematuria   Planned  Watchman device placement for stroke prevention in atrial fibrillation Watchman device placement planned to reduce stroke risk and eliminate need for long-term Eliquis . Risks include stroke, heart wall damage, bleeding, and device migration or failure. Procedure scheduled for February 19th, 2026. - Schedule Watchman device placement for February 19th, 2026. - Order pre-procedure lab work and CT scan to assess left atrial appendage. - Add ASA to plavix  as above for 6 months post procedure  - Plan post-procedure CT scan two months later to assess device position. - Follow up in [place] after the procedure.  I have seen Kyle Lambert in the office today who is being considered for a Watchman left atrial appendage closure device. I believe they will benefit from this procedure given their history of atrial fibrillation, CHA2DS2-VASc score of 4 and unadjusted ischemic stroke rate of 4.8% per year. Unfortunately, the patient is not felt to be a long term anticoagulation candidate secondary to recurrent hematuria. The patient's chart has been reviewed and I feel that they would be a candidate for short term oral anticoagulation after Watchman implant.   It is my belief that after undergoing a LAA closure procedure, Kyle Lambert will not need long term anticoagulation which eliminates anticoagulation side effects and major bleeding risk.   Procedural risks for the Watchman implant have been reviewed with the patient including a 0.5% risk of stroke, <1% risk of perforation and <1% risk of device embolization. Other risks include bleeding, vascular damage, tamponade, worsening renal function, and death. The patient understands these risk and wishes to proceed.    The published clinical data on the safety and effectiveness of WATCHMAN include but are not limited to the following: - Kyle Lambert DR, Kyle Lambert, Kyle Lambert et al. for the PROTECT AF Investigators. Percutaneous closure of the left atrial appendage versus  warfarin therapy for prevention of stroke in patients with atrial fibrillation: a randomised non-inferiority trial. Lancet 2009; 374: 534-42. Kyle Lambert Kyle Lambert, Doshi SK, Jonita VEAR Satchel D et al. on behalf of the PR[employer]ECT AF Investigators. Percutaneous Left Atrial Appendage Closure for Stroke Prophylaxis in Patients With Atrial Fibrillation 2.3-Year Follow-up of the PROTECT AF (Watchman Left Atrial Appendage System for Embolic Protection in Patients With Atrial Fibrillation) Trial. Circulation 2013; 127:720-729. - Alli O, Doshi S,  Kar S, Reddy VY, Sievert H et al. Quality of Life Assessment in the Randomized PROTECT AF (Percutaneous Closure of the Left Atrial Appendage Versus Warfarin Therapy for Prevention of Stroke in Patients With Atrial Fibrillation) Trial of Patients at Risk for Stroke With Nonvalvular Atrial Fibrillation. J Am Coll Cardiol 2013; 61:1790-8. Kyle Lambert Satchel DR, Archer RAMAN, Price M, Whisenant B, Sievert H, Doshi S, Huber K, Reddy V. Prospective randomized evaluation of the Watchman left atrial appendage Device in patients with atrial fibrillation versus long-term warfarin therapy; the PREVAIL trial. Journal of the [employer] of Cardiology, Vol. 4, No. 1, 2014, 1-11. - Kar S, Doshi SK, Sadhu A,  Horton R, Osorio J et al. Primary outcome evaluation of a next-generation left atrial appendage closure device: results from the PINNACLE FLX trial. Circulation 2021;143(18)1754-1762.   After today's visit with the patient which was dedicated solely for shared decision making visit regarding LAA closure device, the patient decided to proceed with the LAA appendage closure procedure.  Prior to the procedure, I would like to obtain a gated CT scan of the chest with contrast timed for PV/LA visualization.   HAS-BLED score 2 Hypertension No  Abnormal renal and liver function (Dialysis, transplant, Cr >2.26 mg/dL /Cirrhosis or Bilirubin >2x Normal or AST/ALT/AP >3x Normal) No  Stroke No  Bleeding Yes  Labile  INR (Unstable/high INR) No  Elderly (>65) Yes  Drugs or alcohol  (>= 8 drinks/week, anti-plt or NSAID) No   CHA2DS2-VASc Score = 4  The patient's score is based upon: CHF History: 0 HTN History: 1 Diabetes History: 1 Stroke History: 0 Vascular Disease History: 1 Age Score: 1 Gender Score: 0  NYHA stage I   Pre-procedure details: Procedure date: 01/10/25 GA+TEE cCT ordered  Hold all medications the morning of the procedure, take all meds the day prior   Dispo: RTC post Watchman implant   Signed, Donnice DELENA Primus, MD  "

## 2024-12-12 NOTE — Patient Instructions (Signed)
 Medication Instructions:  Your physician recommends that you continue on your current medications as directed. Please refer to the Current Medication list given to you today.  *If you need a refill on your cardiac medications before your next appointment, please call your pharmacy*  Lab Work: BMET and CBC If you have labs (blood work) drawn today and your tests are completely normal, you will receive your results only by: MyChart Message (if you have MyChart) OR A paper copy in the mail If you have any lab test that is abnormal or we need to change your treatment, we will call you to review the results.  Testing/Procedures: Your physician has requested that you have Left atrial appendage (LAA) closure device implantation is a procedure to put a small device in the LAA of the heart. The LAA is a small sac in the wall of the heart's left upper chamber. Blood clots can form in this area. The device, Watchman closes the LAA to help prevent a blood clot and stroke.   Your physician has requested that you have cardiac CT. Cardiac computed tomography (CT) is a painless test that uses an x-ray machine to take clear, detailed pictures of your heart. For further information please visit [email or web address] Please follow instruction sheet as given.     Follow-Up: At [facility], you and your health needs are our priority.  As part of our continuing mission to provide you with exceptional heart care, our providers are all part of one team.  This team includes your primary Cardiologist (physician) and Advanced Practice Providers or APPs (Physician Assistants and Nurse Practitioners) who all work together to provide you with the care you need, when you need it.  Your next appointment:   To be scheduled

## 2024-12-12 NOTE — Progress Notes (Deleted)
" °  Cardiology Office Note   Date:  12/12/2024  ID:  Kyle, Lambert December 22, 1950, MRN [id number] PCP: Lari Elspeth BRAVO, MD  [facility] Providers Cardiologist:  Ozell Fell, MD { Click to update primary MD,subspecialty MD or APP then REFRESH:1}    History of Present Illness Kyle Lambert is a 74 y.o. male ***  ROS: ***  Studies Reviewed      *** Risk Assessment/Calculations {Does this patient have ATRIAL FIBRILLATION?:[phone]}         Physical Exam VS:  BP 116/80 (BP Location: Left Arm, Patient Position: Sitting, Cuff Size: Large)   Pulse 64   Ht 5' 6.5 (1.689 m)   Wt 230 lb (104.3 kg)   SpO2 95%   BMI 36.57 kg/m        Wt Readings from Last 3 Encounters:  12/12/24 230 lb (104.3 kg)  10/29/24 231 lb (104.8 kg)  05/23/24 227 lb 15.3 oz (103.4 kg)    GEN: Well nourished, well developed in no acute distress NECK: No JVD; No carotid bruits CARDIAC: ***RRR, no murmurs, rubs, gallops RESPIRATORY:  Clear to auscultation without rales, wheezing or rhonchi  ABDOMEN: Soft, non-tender, non-distended EXTREMITIES:  No edema; No deformity   ASSESSMENT AND PLAN ***    {Are you ordering a CV Procedure (e.g. stress test, cath, DCCV, TEE, etc)?   Press F2        :[id number]}  Dispo: ***  Signed, Donnice DELENA Primus, MD  "

## 2024-12-13 LAB — BASIC METABOLIC PANEL WITH GFR
BUN/Creatinine Ratio: 18 (ref 10–24)
BUN: 13 mg/dL (ref 8–27)
CO2: 19 mmol/L — ABNORMAL LOW (ref 20–29)
Calcium: 9.4 mg/dL (ref 8.6–10.2)
Chloride: 101 mmol/L (ref 96–106)
Creatinine, Ser: 0.71 mg/dL — ABNORMAL LOW (ref 0.76–1.27)
Glucose: 128 mg/dL — ABNORMAL HIGH (ref 70–99)
Potassium: 4.7 mmol/L (ref 3.5–5.2)
Sodium: 139 mmol/L (ref 134–144)
eGFR: 96 mL/min/1.73

## 2024-12-13 LAB — CBC
Hematocrit: 40.9 % (ref 37.5–51.0)
Hemoglobin: 13.4 g/dL (ref 13.0–17.7)
MCH: 29.9 pg (ref 26.6–33.0)
MCHC: 32.8 g/dL (ref 31.5–35.7)
MCV: 91 fL (ref 79–97)
Platelets: 163 x10E3/uL (ref 150–450)
RBC: 4.48 x10E6/uL (ref 4.14–5.80)
RDW: 12.7 % (ref 11.6–15.4)
WBC: 7.1 x10E3/uL (ref 3.4–10.8)

## 2024-12-26 ENCOUNTER — Ambulatory Visit (HOSPITAL_COMMUNITY)
Admission: RE | Admit: 2024-12-26 | Discharge: 2024-12-26 | Disposition: A | Source: Ambulatory Visit | Attending: Cardiovascular Disease | Admitting: Cardiovascular Disease

## 2024-12-26 ENCOUNTER — Ambulatory Visit: Admitting: Podiatry

## 2024-12-26 DIAGNOSIS — I480 Paroxysmal atrial fibrillation: Secondary | ICD-10-CM

## 2024-12-26 MED ORDER — IOHEXOL 350 MG/ML SOLN
80.0000 mL | Freq: Once | INTRAVENOUS | Status: AC | PRN
  Administered 2024-12-26: 80 mL via INTRAVENOUS

## 2024-12-27 ENCOUNTER — Ambulatory Visit: Admitting: Podiatry

## 2024-12-27 ENCOUNTER — Telehealth: Payer: Self-pay

## 2024-12-27 ENCOUNTER — Encounter: Payer: Self-pay | Admitting: Podiatry

## 2024-12-27 DIAGNOSIS — E1169 Type 2 diabetes mellitus with other specified complication: Secondary | ICD-10-CM

## 2024-12-27 DIAGNOSIS — B351 Tinea unguium: Secondary | ICD-10-CM

## 2024-12-27 NOTE — Progress Notes (Signed)

## 2024-12-27 NOTE — Telephone Encounter (Signed)
 Broccoli morphology Max 25/ AVG 22/ Depth 15.8 Likely use a 27mm device Inf/Mid-Ant TSP RAO 29 CAU 29

## 2024-12-28 ENCOUNTER — Telehealth: Payer: Self-pay

## 2024-12-28 NOTE — Telephone Encounter (Signed)
 After discussion with Dr. [place], plan to tentatively book LAAO for 2/19. Will confirm on Monday after reviewing images further.   Attempted to call patient to discuss. Left a message for return call.

## 2025-01-28 ENCOUNTER — Other Ambulatory Visit: Admitting: Urology

## 2025-01-28 ENCOUNTER — Other Ambulatory Visit

## 2025-03-26 ENCOUNTER — Ambulatory Visit: Admitting: Podiatry
